# Patient Record
Sex: Female | Born: 1950 | Race: Asian | Hispanic: No | Marital: Married | Smoking: Never smoker
Health system: Southern US, Community
[De-identification: ages and names within clinical notes are randomized; demographics above are authoritative.]

## PROBLEM LIST (undated history)

## (undated) DIAGNOSIS — I10 Essential (primary) hypertension: Secondary | ICD-10-CM

## (undated) DIAGNOSIS — M8580 Other specified disorders of bone density and structure, unspecified site: Secondary | ICD-10-CM

## (undated) DIAGNOSIS — I639 Cerebral infarction, unspecified: Secondary | ICD-10-CM

## (undated) DIAGNOSIS — E7800 Pure hypercholesterolemia, unspecified: Secondary | ICD-10-CM

---

## 2019-10-08 ENCOUNTER — Ambulatory Visit: Payer: Self-pay

## 2019-10-08 ENCOUNTER — Ambulatory Visit: Payer: Self-pay | Attending: Internal Medicine

## 2019-10-08 DIAGNOSIS — Z23 Encounter for immunization: Secondary | ICD-10-CM | POA: Insufficient documentation

## 2019-10-08 NOTE — Progress Notes (Signed)
   Covid-19 Vaccination Clinic  Name:  Brittany Griffith    MRN: [id number] DOB: 03-30-1951  10/08/2019  Ms. Brittany Griffith was observed post Covid-19 immunization for 15 minutes without incidence. She was provided with Vaccine Information Sheet and instruction to access the V-Safe system.   Ms. Brittany Griffith was instructed to call 911 with any severe reactions post vaccine: Marland Kitchen Difficulty breathing  . Swelling of your face and throat  . A fast heartbeat  . A bad rash all over your body  . Dizziness and weakness    Immunizations Administered    Name Date Dose VIS Date Route   Pfizer COVID-19 Vaccine 10/08/2019 12:13 PM 0.3 mL 07/24/2019 Intramuscular   Manufacturer: [place]   Lot: [id number]   [place]: [id number]

## 2019-10-28 ENCOUNTER — Ambulatory Visit: Payer: Self-pay | Attending: Internal Medicine

## 2019-10-28 DIAGNOSIS — Z23 Encounter for immunization: Secondary | ICD-10-CM

## 2019-10-28 NOTE — Progress Notes (Signed)
   Covid-19 Vaccination Clinic  Name:  Mekiya Nemechek    MRN: [id number] DOB: 01-16-1951  10/28/2019  Ms. Hustead was observed post Covid-19 immunization for 15 minutes without incident. She was provided with Vaccine Information Sheet and instruction to access the V-Safe system.   Ms. [name] was instructed to call 911 with any severe reactions post vaccine: Marland Kitchen Difficulty breathing  . Swelling of face and throat  . A fast heartbeat  . A bad rash all over body  . Dizziness and weakness   Immunizations Administered    Name Date Dose VIS Date Route   Pfizer COVID-19 Vaccine 10/28/2019  1:34 PM 0.3 mL 07/24/2019 Intramuscular   Manufacturer: [place]   Lot: [id number]   [place]: [id number]

## 2020-01-06 ENCOUNTER — Other Ambulatory Visit: Payer: Self-pay | Admitting: Family Medicine

## 2020-01-06 DIAGNOSIS — Z1231 Encounter for screening mammogram for malignant neoplasm of breast: Secondary | ICD-10-CM

## 2020-01-06 DIAGNOSIS — E2839 Other primary ovarian failure: Secondary | ICD-10-CM

## 2020-10-23 ENCOUNTER — Emergency Department (HOSPITAL_COMMUNITY): Payer: Medicare HMO

## 2020-10-23 ENCOUNTER — Observation Stay (HOSPITAL_COMMUNITY): Payer: Medicare HMO

## 2020-10-23 ENCOUNTER — Other Ambulatory Visit: Payer: Self-pay

## 2020-10-23 ENCOUNTER — Inpatient Hospital Stay (HOSPITAL_COMMUNITY)
Admission: EM | Admit: 2020-10-23 | Discharge: 2020-11-01 | DRG: 023 | Disposition: A | Discharge status: Rehabilitation Facility | Payer: Medicare HMO | Attending: Neurology | Admitting: Neurology

## 2020-10-23 ENCOUNTER — Encounter (HOSPITAL_COMMUNITY): Payer: Self-pay | Admitting: Emergency Medicine

## 2020-10-23 DIAGNOSIS — I6602 Occlusion and stenosis of left middle cerebral artery: Secondary | ICD-10-CM | POA: Diagnosis present

## 2020-10-23 DIAGNOSIS — R29709 NIHSS score 9: Secondary | ICD-10-CM | POA: Diagnosis present

## 2020-10-23 DIAGNOSIS — I160 Hypertensive urgency: Secondary | ICD-10-CM | POA: Diagnosis not present

## 2020-10-23 DIAGNOSIS — I771 Stricture of artery: Secondary | ICD-10-CM

## 2020-10-23 DIAGNOSIS — R4702 Dysphasia: Secondary | ICD-10-CM | POA: Diagnosis present

## 2020-10-23 DIAGNOSIS — D329 Benign neoplasm of meninges, unspecified: Secondary | ICD-10-CM | POA: Diagnosis present

## 2020-10-23 DIAGNOSIS — Z823 Family history of stroke: Secondary | ICD-10-CM

## 2020-10-23 DIAGNOSIS — R29810 Facial weakness: Secondary | ICD-10-CM | POA: Diagnosis present

## 2020-10-23 DIAGNOSIS — D320 Benign neoplasm of cerebral meninges: Secondary | ICD-10-CM | POA: Diagnosis present

## 2020-10-23 DIAGNOSIS — E876 Hypokalemia: Secondary | ICD-10-CM | POA: Diagnosis present

## 2020-10-23 DIAGNOSIS — R4701 Aphasia: Secondary | ICD-10-CM | POA: Diagnosis not present

## 2020-10-23 DIAGNOSIS — R531 Weakness: Secondary | ICD-10-CM

## 2020-10-23 DIAGNOSIS — M852 Hyperostosis of skull: Secondary | ICD-10-CM | POA: Diagnosis present

## 2020-10-23 DIAGNOSIS — E041 Nontoxic single thyroid nodule: Secondary | ICD-10-CM | POA: Diagnosis present

## 2020-10-23 DIAGNOSIS — Z20822 Contact with and (suspected) exposure to covid-19: Secondary | ICD-10-CM | POA: Diagnosis present

## 2020-10-23 DIAGNOSIS — I63512 Cerebral infarction due to unspecified occlusion or stenosis of left middle cerebral artery: Secondary | ICD-10-CM

## 2020-10-23 DIAGNOSIS — G936 Cerebral edema: Secondary | ICD-10-CM | POA: Diagnosis present

## 2020-10-23 DIAGNOSIS — I639 Cerebral infarction, unspecified: Secondary | ICD-10-CM

## 2020-10-23 DIAGNOSIS — R1312 Dysphagia, oropharyngeal phase: Secondary | ICD-10-CM | POA: Diagnosis present

## 2020-10-23 DIAGNOSIS — G8191 Hemiplegia, unspecified affecting right dominant side: Secondary | ICD-10-CM | POA: Diagnosis present

## 2020-10-23 DIAGNOSIS — D72829 Elevated white blood cell count, unspecified: Secondary | ICD-10-CM | POA: Diagnosis not present

## 2020-10-23 DIAGNOSIS — R739 Hyperglycemia, unspecified: Secondary | ICD-10-CM | POA: Diagnosis not present

## 2020-10-23 DIAGNOSIS — I672 Cerebral atherosclerosis: Secondary | ICD-10-CM | POA: Diagnosis present

## 2020-10-23 DIAGNOSIS — E785 Hyperlipidemia, unspecified: Secondary | ICD-10-CM | POA: Diagnosis present

## 2020-10-23 DIAGNOSIS — G40209 Localization-related (focal) (partial) symptomatic epilepsy and epileptic syndromes with complex partial seizures, not intractable, without status epilepticus: Secondary | ICD-10-CM | POA: Diagnosis present

## 2020-10-23 HISTORY — DX: Pure hypercholesterolemia, unspecified: E78.00

## 2020-10-23 HISTORY — DX: Other specified disorders of bone density and structure, unspecified site: M85.80

## 2020-10-23 HISTORY — DX: Essential (primary) hypertension: I10

## 2020-10-23 LAB — RAPID URINE DRUG SCREEN, HOSP PERFORMED
Amphetamines: NOT DETECTED
Barbiturates: NOT DETECTED
Benzodiazepines: NOT DETECTED
Cocaine: NOT DETECTED
Opiates: NOT DETECTED
Tetrahydrocannabinol: NOT DETECTED

## 2020-10-23 LAB — COMPREHENSIVE METABOLIC PANEL
ALT: 17 U/L (ref 0–44)
AST: 22 U/L (ref 15–41)
Albumin: 3.8 g/dL (ref 3.5–5.0)
Alkaline Phosphatase: 54 U/L (ref 38–126)
Anion gap: 10 (ref 5–15)
BUN: 11 mg/dL (ref 8–23)
CO2: 21 mmol/L — ABNORMAL LOW (ref 22–32)
Calcium: 9.2 mg/dL (ref 8.9–10.3)
Chloride: 108 mmol/L (ref 98–111)
Creatinine, Ser: 0.67 mg/dL (ref 0.44–1.00)
GFR, Estimated: >60 mL/min (ref >60)
Glucose, Bld: 94 mg/dL (ref 70–99)
Potassium: 3.6 mmol/L (ref 3.5–5.1)
Sodium: 139 mmol/L (ref 135–145)
Total Bilirubin: 1 mg/dL (ref 0.3–1.2)
Total Protein: 6.9 g/dL (ref 6.5–8.1)

## 2020-10-23 LAB — I-STAT CHEM 8, ED
BUN: 12 mg/dL (ref 8–23)
Calcium, Ion: 1.03 mmol/L — ABNORMAL LOW (ref 1.15–1.40)
Chloride: 109 mmol/L (ref 98–111)
Creatinine, Ser: 0.7 mg/dL (ref 0.44–1.00)
Glucose, Bld: 91 mg/dL (ref 70–99)
HCT: 38 % (ref 36.0–46.0)
Hemoglobin: 12.9 g/dL (ref 12.0–15.0)
Potassium: 3.5 mmol/L (ref 3.5–5.1)
Sodium: 141 mmol/L (ref 135–145)
TCO2: 22 mmol/L (ref 22–32)

## 2020-10-23 LAB — URINALYSIS, ROUTINE W REFLEX MICROSCOPIC
Bilirubin Urine: NEGATIVE
Glucose, UA: NEGATIVE mg/dL
Hgb urine dipstick: NEGATIVE
Ketones, ur: NEGATIVE mg/dL
Leukocytes,Ua: NEGATIVE
Nitrite: NEGATIVE
Protein, ur: NEGATIVE mg/dL
Specific Gravity, Urine: 1.027 (ref 1.005–1.030)
pH: 6 (ref 5.0–8.0)

## 2020-10-23 LAB — CBC
HCT: 42.5 % (ref 36.0–46.0)
Hemoglobin: 13.4 g/dL (ref 12.0–15.0)
MCH: 28.6 pg (ref 26.0–34.0)
MCHC: 31.5 g/dL (ref 30.0–36.0)
MCV: 90.8 fL (ref 80.0–100.0)
Platelets: 213 10*3/uL (ref 150–400)
RBC: 4.68 MIL/uL (ref 3.87–5.11)
RDW: 12.3 % (ref 11.5–15.5)
WBC: 6.8 10*3/uL (ref 4.0–10.5)
nRBC: 0 % (ref 0.0–0.2)

## 2020-10-23 LAB — CBG MONITORING, ED: Glucose-Capillary: 98 mg/dL (ref 70–99)

## 2020-10-23 LAB — RESP PANEL BY RT-PCR (FLU A&B, COVID) ARPGX2
Influenza A by PCR: NEGATIVE
Influenza B by PCR: NEGATIVE
SARS Coronavirus 2 by RT PCR: NEGATIVE

## 2020-10-23 MED ORDER — GADOBUTROL 1 MMOL/ML IV SOLN
7.0000 mL | Freq: Once | INTRAVENOUS | Status: AC | PRN
  Administered 2020-10-23: 7 mL via INTRAVENOUS

## 2020-10-23 MED ORDER — ACETAMINOPHEN 325 MG PO TABS: 650.0000 mg | ORAL_TABLET | ORAL | Status: DC | PRN

## 2020-10-23 MED ORDER — IOHEXOL 350 MG/ML SOLN
75.0000 mL | Freq: Once | INTRAVENOUS | Status: AC | PRN
  Administered 2020-10-23: 75 mL via INTRAVENOUS

## 2020-10-23 MED ORDER — STROKE: EARLY STAGES OF RECOVERY BOOK
Freq: Once | Status: AC
  Filled 2020-10-23: qty 1

## 2020-10-23 MED ORDER — ACETAMINOPHEN 650 MG RE SUPP: 650.0000 mg | RECTAL | Status: DC | PRN

## 2020-10-23 MED ORDER — DEXAMETHASONE 4 MG PO TABS
2.0000 mg | ORAL_TABLET | Freq: Once | ORAL | Status: AC
  Administered 2020-10-23: 2 mg via ORAL
  Filled 2020-10-23: qty 1

## 2020-10-23 MED ORDER — ACETAMINOPHEN 160 MG/5ML PO SOLN
650.0000 mg | ORAL | Status: DC | PRN
  Administered 2020-10-28 (×2): 650 mg
  Filled 2020-10-23 (×2): qty 20.3

## 2020-10-23 MED ORDER — SODIUM CHLORIDE 0.9 % IV SOLN: INTRAVENOUS | Status: DC

## 2020-10-23 MED ORDER — LEVETIRACETAM IN NACL 1000 MG/100ML IV SOLN
1000.0000 mg | Freq: Once | INTRAVENOUS | Status: AC
  Administered 2020-10-23: 1000 mg via INTRAVENOUS
  Filled 2020-10-23: qty 100

## 2020-10-23 NOTE — ED Triage Notes (Signed)
Pt BIB [facility] c/o AMS. Husband reports LSN at 0930 this AM. Husband found pt slumped over on a chair. Per husband, pt was nonverbal, tracking with eyes, crying, unable to follow commands. Pt presented the same on EMS arrival, sx resolved ~20 min after EMS arrival. Pt now A/Ox4, able to follow commands. No neuro deficits on assessment. Pt reports mild headache last night and being unable to sleep well for a few days.   EMS VS 190/98, HR 60, SpO2 98%, CBG 113

## 2020-10-23 NOTE — ED Provider Notes (Signed)
  Physical Exam  BP (!) 204/78   Pulse 96   Temp (!) 97.5 F (36.4 C) (Oral)   Resp (!) 24   Ht 5\' 2"  (1.575 m)   Wt 69.9 kg   SpO2 98%   BMI 28.17 kg/m   Physical Exam  ED Course/Procedures     .Critical Care Performed by: Varney Biles, MD Authorized by: Varney Biles, MD   Critical care provider statement:    Critical care time (minutes):  44   Critical care was necessary to treat or prevent imminent or life-threatening deterioration of the following conditions:  CNS failure or compromise   Critical care was time spent personally by me on the following activities:  Discussions with consultants, evaluation of patient's response to treatment, examination of patient, ordering and performing treatments and interventions, ordering and review of laboratory studies, ordering and review of radiographic studies, pulse oximetry, re-evaluation of patient's condition, obtaining history from patient or surrogate and review of old charts    MDM   70 year old healthy woman comes in with chief complaint of weakness and confusion. She has no significant medical history and no prior history of stroke.  Patient is not on any antihypertensive, but was told that she has borderline elevated BP.  It appears that she woke up this morning and felt off.  When she was brushing her teeth she noted that she had applied paste to her mouth.  Thereafter when she was holding her blankets, she was unable to do so.  Family when interviewing noted that she was " out of it".  They also noted right-sided upper and lower extremity weakness.  Patient had facial droop.  Family felt like she was unable to express herself freely.  When I saw her patient was back to baseline normal.  She did not have any facial droop or weakness.  She felt that she was able to express herself without any issues and that her speech was back to baseline normal.  Initial suspicion was for TIA versus small stroke.  We proceeded with  CT angiogram and MR of the brain without contrast. Subsequently it was noted that she had large meningioma.  MRI with and without contrast was ordered and it confirmed a large meningioma with acute versus subacute left-sided stroke.  Case discussed with neurosurgery. Dr. Venetia Constable will see the patient.  He recommends admission to the hospital with the finding of stroke. Dr. Venetia Constable has also called neurology given that he felt that his speech was still not fluent.  Medicine will admit the patient.  Patient's BP is high, allowing permissive hypertension. Keppra and dexamethasone given here.  Patient reassessed multiple times.     Varney Biles, MD 10/23/20 1745

## 2020-10-23 NOTE — H&P (Signed)
[facility] History and Physical   Patient: Brittany Griffith YJE:[id number]   PCP: [facility] [place] DOB: 01/02/1951   DOA: 10/23/2020   DOS: 10/23/2020   DOS: the patient was seen and examined on 10/23/2020  Patient coming from: The patient is coming from Home  Chief Complaint: Difficulty with speech and right-sided weakness  HPI: Brittany Griffith is a 70 y.o. female with Past medical history of hyperlipidemia and hypertension and osteopenia. Patient presented with complaints of speech difficulty and right-sided weakness. History was primarily obtained by husband.  Patient started having difficulty with right-sided weakness around 9:30 AM.  Husband found the patient slumped over in a chair.  Patient was initially nonverbal but tracking with the eyes.  Had significant weakness of her right side upper extremity and lower extremity.  Also had facial drooping on the right side.  On EMS arrival symptom completely resolved. Apparently this morning prior to this event patient was not able to further plan care, use a toothpaste. While initially had the symptoms resolved in the ER later on the day at the time of my evaluation patient started having difficulty with getting the words out.  She was able to tell me her name her place date of birth.  No nausea no vomiting.  No chest pain.  On diarrhea no constipation.  No fever no chills.  Does not take any medication at her baseline.  No smoking history.  No prior fall dizziness passing out history.  ED Course: Presented with right-sided weakness.  Initial concern for TIA/stroke.  CT head showed a meningioma.  Neurosurgery was consulted.  Patient was given a gram of Keppra and was recommended for observation given the severity of the mass.  Due to intermittent aphasic complaint neurology was consulted for further evaluation as MRI was also positive for acute stroke.  Patient was out of the window for TPA.   Review of Systems: as  mentioned in the history of present illness.  All other systems reviewed and are negative.  Past Medical History:  Diagnosis Date  . Hypercholesteremia   . Hypertension   . Osteopenia    No significant surgical history. Social History:  no history for tobacco use, alcohol use, and drug use.  Allergies  Allergen Reactions  . Penicillins    Family history reviewed and not pertinent Family History  Problem Relation Age of Onset  . Stroke Father   . Heart attack Father   . Kidney disease Brother      Prior to Admission medications   Not on File    Physical Exam: Vitals:   10/23/20 1815 10/23/20 1830 10/23/20 1845 10/23/20 1900  BP: (!) 181/67 (!) 195/73 (!) 179/67 (!) 165/71  Pulse: 69 66 69 67  Resp: 18 16 (!) 21 16  Temp:      TempSrc:      SpO2: 96% 96% 96% 98%  Weight:      Height:        General: alert and oriented to time, place, and person. Appear in moderate distress, affect tearful Eyes: PERRL, Conjunctiva normal ENT: Oral Mucosa Clear, moist  Neck: no JVD, no Abnormal Mass Or lumps Cardiovascular: S1 and S2 Present, no Murmur, peripheral pulses symmetrical Respiratory: good respiratory effort, Bilateral Air entry equal and Decreased, no signs of accessory muscle use, Clear to Auscultation, no Crackles, no wheezes Abdomen: Bowel Sound present, Soft and no tenderness, no hernia Skin: no rashes  Extremities: no Pedal edema, no calf tenderness Neurologic:  mental status, alert and oriented x3, speech normal, PERLA, Motor strength 5/5 and symmetric, Sensation grossly normal to light touch and Reflex difficult to assess Gait not checked due to patient safety concerns  Data Reviewed: I have personally reviewed and interpreted labs, imaging as discussed below.  CBC: Recent Labs  Lab 10/23/20 1138 10/23/20 1206  WBC 6.8  --   HGB 13.4 12.9  HCT 42.5 38.0  MCV 90.8  --   PLT 213  --    Basic Metabolic Panel: Recent Labs  Lab 10/23/20 1138  10/23/20 1206  NA 139 141  K 3.6 3.5  CL 108 109  CO2 21*  --   GLUCOSE 94 91  BUN 11 12  CREATININE 0.67 0.70  CALCIUM 9.2  --    GFR: Estimated Creatinine Clearance: 62.5 mL/min (by C-G formula based on SCr of 0.7 mg/dL). Liver Function Tests: Recent Labs  Lab 10/23/20 1138  AST 22  ALT 17  ALKPHOS 54  BILITOT 1.0  PROT 6.9  ALBUMIN 3.8   No results for input(s): LIPASE, AMYLASE in the last 168 hours. No results for input(s): AMMONIA in the last 168 hours. Coagulation Profile: No results for input(s): INR, PROTIME in the last 168 hours. Cardiac Enzymes: No results for input(s): CKTOTAL, CKMB, CKMBINDEX, TROPONINI in the last 168 hours. BNP (last 3 results) No results for input(s): PROBNP in the last 8760 hours. HbA1C: No results for input(s): HGBA1C in the last 72 hours. CBG: Recent Labs  Lab 10/23/20 1126  GLUCAP 98   Lipid Profile: No results for input(s): CHOL, HDL, LDLCALC, TRIG, CHOLHDL, LDLDIRECT in the last 72 hours. Thyroid Function Tests: No results for input(s): TSH, T4TOTAL, FREET4, T3FREE, THYROIDAB in the last 72 hours. Anemia Panel: No results for input(s): VITAMINB12, FOLATE, FERRITIN, TIBC, IRON, RETICCTPCT in the last 72 hours. Urine analysis:    Component Value Date/Time   COLORURINE STRAW (A) 10/23/2020 1430   APPEARANCEUR CLEAR 10/23/2020 1430   LABSPEC 1.027 10/23/2020 1430   PHURINE 6.0 10/23/2020 1430   GLUCOSEU NEGATIVE 10/23/2020 1430   HGBUR NEGATIVE 10/23/2020 1430   BILIRUBINUR NEGATIVE 10/23/2020 1430   KETONESUR NEGATIVE 10/23/2020 1430   PROTEINUR NEGATIVE 10/23/2020 1430   NITRITE NEGATIVE 10/23/2020 1430   LEUKOCYTESUR NEGATIVE 10/23/2020 1430    Radiological Exams on Admission: CT Angio Head W/Cm &/Or Wo Cm  Result Date: 10/23/2020 CLINICAL DATA:  Altered mental status. EXAM: CT ANGIOGRAPHY HEAD AND NECK TECHNIQUE: Multidetector CT imaging of the head and neck was performed using the standard protocol during bolus  administration of intravenous contrast. Multiplanar CT image reconstructions and MIPs were obtained to evaluate the vascular anatomy. Carotid stenosis measurements (when applicable) are obtained utilizing NASCET criteria, using the distal internal carotid diameter as the denominator. CONTRAST:  11mL OMNIPAQUE IOHEXOL 350 MG/ML SOLN COMPARISON:  None. FINDINGS: CT HEAD FINDINGS Brain: There is a 2.2 x 2.2 x 2.4 cm partially calcified extra-axial mass anteriorly along the falx in the left frontal region with evidence of very mild edema in the left frontal lobe. This mass is associated with extensive dural ossification/hyperostosis anteriorly and laterally over the left frontal convexity with mass effect on the underlying left frontal lobe. Separate foci of dural calcification/ossification are noted more superiorly along the falx. There is also an 8 mm focus of fat along the inner aspect of the anterior left frontal hyperostosis. There is trace rightward midline shift at the level of the mass. There is mild mass effect on the left  lateral ventricle. There is no evidence of hydrocephalus, an acute infarct, intracranial hemorrhage, or extra-axial fluid collection. A small right cerebellar infarct is favored to be chronic. Vascular: Calcified atherosclerosis at the skull base. Skull: Extensive left frontal hyperostosis. Sinuses: Partially visualized mucosal thickening and likely fluid in the maxillary sinuses. Clear mastoid air cells. Orbits: Unremarkable. 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 Wide patency of both subclavian arteries with mild nonstenotic plaque in the proximal left subclavian artery. Right carotid system: Patent with mild atheromatous wall thickening in the mid and distal common carotid artery. No evidence of a dissection or  significant stenosis. Left carotid system: Patent with mild atheromatous wall thickening in the mid and distal common carotid artery and mild calcified plaque at the carotid bifurcation. No evidence of dissection or significant stenosis. Vertebral arteries: Patent without evidence of stenosis or dissection. Strongly dominant left vertebral artery. Skeleton: Mild disc and moderate facet degeneration in the cervical spine. Other neck: Bilateral thyroid nodules including a 2.3 cm nodule on the right. No evidence of cervical lymphadenopathy Upper chest: Left greater than right apical lung scarring. Review of the MIP images confirms the above findings CTA HEAD FINDINGS Anterior circulation: The internal carotid arteries are patent from skull base to carotid termini with mild atherosclerotic plaque bilaterally not resulting in a significant stenosis. ACAs and MCAs are patent without evidence of a proximal branch occlusion. There are moderate proximal right M1 and severe distal left M1 stenoses. Mild branch vessel irregularity is present bilaterally. No aneurysm is identified. Posterior circulation: The intracranial vertebral arteries are widely patent to the basilar with the right being diminutive distal to the PICA origin. Patent PICA, AICA, and SCA origins are identified bilaterally. The basilar artery is widely patent. Both PCAs are patent with moderate left greater than right P2 segment stenoses. No aneurysm is identified. Venous sinuses: Narrowed but patent superior sagittal sinus by dural ossification. Anatomic variants: Hypoplastic right vertebral artery. Review of the MIP images confirms the above findings IMPRESSION: 1. 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 Mild edema in the left frontal lobe. Trace local rightward midline shift. Brain MRI without and with contrast is recommended for further evaluation. 2. No evidence of an acute infarct or  intracranial hemorrhage. 3. No large vessel occlusion. 4. Intracranial atherosclerosis including moderate right and severe left M1 stenoses and moderate bilateral P2 stenoses. 5. Widely patent cervical carotid and vertebral arteries. 6. 2.3 cm right thyroid nodule. Recommend thyroid US (ref: J Am Coll Radiol. 2015 Feb;12(2): 143-50). Electronically Signed   By: Logan Bores M.D.   On: 10/23/2020 14:06   CT Angio Neck W and/or Wo Contrast  Result Date: 10/23/2020 CLINICAL DATA:  Altered mental status. EXAM: CT ANGIOGRAPHY HEAD AND NECK TECHNIQUE: Multidetector CT imaging of the head and neck was performed using the standard protocol during bolus administration of intravenous contrast. Multiplanar CT image reconstructions and MIPs were obtained to evaluate the vascular anatomy. Carotid stenosis measurements (when applicable) are obtained utilizing NASCET criteria, using the distal internal carotid diameter as the denominator. CONTRAST:  43mL OMNIPAQUE IOHEXOL 350 MG/ML SOLN COMPARISON:  None. FINDINGS: CT HEAD FINDINGS Brain: There is a 2.2 x 2.2 x 2.4 cm partially calcified extra-axial mass anteriorly along the falx in the left frontal region with evidence of very mild edema  in the left frontal lobe. This mass is associated with extensive dural ossification/hyperostosis anteriorly and laterally over the left frontal convexity with mass effect on the underlying left frontal lobe. Separate foci of dural calcification/ossification are noted more superiorly along the falx. There is also an 8 mm focus of fat along the inner aspect of the anterior left frontal hyperostosis. There is trace rightward midline shift at the level of the mass. There is mild mass effect on the left lateral ventricle. There is no evidence of hydrocephalus, an acute infarct, intracranial hemorrhage, or extra-axial fluid collection. A small right cerebellar infarct is favored to be chronic. Vascular: Calcified atherosclerosis at the skull base.  Skull: Extensive left frontal hyperostosis. Sinuses: Partially visualized mucosal thickening and likely fluid in the maxillary sinuses. Clear mastoid air cells. Orbits: Unremarkable. 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 Wide patency of both subclavian arteries with mild nonstenotic plaque in the proximal left subclavian artery. Right carotid system: Patent with mild atheromatous wall thickening in the mid and distal common carotid artery. No evidence of a dissection or significant stenosis. Left carotid system: Patent with mild atheromatous wall thickening in the mid and distal common carotid artery and mild calcified plaque at the carotid bifurcation. No evidence of dissection or significant stenosis. Vertebral arteries: Patent without evidence of stenosis or dissection. Strongly dominant left vertebral artery. Skeleton: Mild disc and moderate facet degeneration in the cervical spine. Other neck: Bilateral thyroid nodules including a 2.3 cm nodule on the right. No evidence of cervical lymphadenopathy Upper chest: Left greater than right apical lung scarring. Review of the MIP images confirms the above findings CTA HEAD FINDINGS Anterior circulation: The internal carotid arteries are patent from skull base to carotid termini with mild atherosclerotic plaque bilaterally not resulting in a significant stenosis. ACAs and MCAs are patent without evidence of a proximal branch occlusion. There are moderate proximal right M1 and severe distal left M1 stenoses. Mild branch vessel irregularity is present bilaterally. No aneurysm is identified. Posterior circulation: The intracranial vertebral arteries are widely patent to the basilar with the right being diminutive distal to the PICA origin. Patent PICA, AICA, and SCA origins are identified bilaterally.  The basilar artery is widely patent. Both PCAs are patent with moderate left greater than right P2 segment stenoses. No aneurysm is identified. Venous sinuses: Narrowed but patent superior sagittal sinus by dural ossification. Anatomic variants: Hypoplastic right vertebral artery. Review of the MIP images confirms the above findings IMPRESSION: 1. 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 Mild edema in the left frontal lobe. Trace local rightward midline shift. Brain MRI without and with contrast is recommended for further evaluation. 2. No evidence of an acute infarct or intracranial hemorrhage. 3. No large vessel occlusion. 4. Intracranial atherosclerosis including moderate right and severe left M1 stenoses and moderate bilateral P2 stenoses. 5. Widely patent cervical carotid and vertebral arteries. 6. 2.3 cm right thyroid nodule. Recommend thyroid US (ref: J Am Coll Radiol. 2015 Feb;12(2): 143-50). Electronically Signed   By: Logan Bores M.D.   On: 10/23/2020 14:06   MR Brain W and Wo Contrast  Result Date: 10/23/2020 CLINICAL DATA:  TIA. Altered mental status. Left frontal parafalcine mass on CT. EXAM: MRI HEAD WITHOUT AND WITH CONTRAST TECHNIQUE: Multiplanar, multiecho pulse sequences of the brain and  surrounding structures were obtained without and with intravenous contrast. CONTRAST:  56mL GADAVIST GADOBUTROL 1 MMOL/ML IV SOLN COMPARISON:  Head and neck CTA 10/23/2020 FINDINGS: Brain: There are subcentimeter acute infarcts in the posterior left insula and left parietal subcortical white matter (MCA territory). There is are small chronic bilateral cerebellar infarcts. There is no intracranial hemorrhage, hydrocephalus, or extra-axial fluid collection. Scattered small foci of T2 hyperintensity in the cerebral white matter bilaterally are nonspecific but compatible with mild chronic small vessel ischemic disease. An avidly enhancing  extra-axial mass along the left anterior falx in the frontal region measures 2.6 x 2.2 x 2.5 cm (AP x transverse x craniocaudal) with mild edema in the adjacent left frontal white matter and minimal localized rightward midline shift. 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 This extends inferiorly and laterally into the anterior and middle cranial fossae, and there is a 1.1 cm enhancing component which projects into the left sylvian fissure. A separate enhancing calcified mass along the left aspect of the falx in the posterior frontal region measures 1.5 cm. There is mild diffuse dural thickening along the upper portion of the falx. Vascular: Major intracranial vascular flow voids are preserved. Skull and upper cervical spine: Extensive left frontal hyperostosis. Sinuses/Orbits: Unremarkable orbits. Paranasal sinuses and mastoid air cells are clear. Other: None. IMPRESSION: 1. 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 2. 2.6 cm enhancing left frontal parafalcine mass consistent with a meningioma with mild edema and minimal rightward midline shift. This is associated with extensive dural ossification and left frontal skull hyperostosis along the anterior and lateral cerebral convexities. 3. Smaller meningiomas along the falx in the posterior left frontal region and laterally in the left sylvian fissure. 4. 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 Electronically Signed   By: Logan Bores M.D.   On: 10/23/2020 16:08   EEG adult  Result Date: 10/23/2020 [name], MD     10/23/2020  7:38 PM Patient Name: Geniece Akers MRN: [id number] Epilepsy Attending: [name] Referring Physician/Provider: DR Lesleigh Noe Date: 10/23/2020 Duration: 30.35 mins Patient history: 70 y.o. woman w/ 1h of aphasia and R sided weakness.  EEG to evaluate for seizure Level of alertness: Awake, asleep AEDs during EEG study: Technical aspects: This EEG study was done with scalp electrodes positioned according to the 10-20 International system of electrode placement. Electrical activity was acquired at a sampling rate of 500Hz  and reviewed with a high frequency filter of 70Hz  and a low frequency filter of 1Hz . EEG data were recorded continuously and digitally stored. Description: The posterior dominant rhythm consists of 9-10 Hz activity of moderate voltage (25-35 uV) seen predominantly in posterior head regions, symmetric and reactive to eye opening and eye closing. Sleep was characterized by vertex waves, sleep spindles (12 to 14 Hz), maximal frontocentral region. EEG showed intermitten tleft temporal 2-3hz  delta slowing.  Hyperventilation and photic stimulation were not performed.   ABNORMALITY -Intermittent slow, left temporal IMPRESSION: This study is suggestive of cortical dysfunction in left temporal region which is non specific but could be secondary to underlying structural abnormality. No seizures or epileptiform discharges were seen throughout the recording. Priyanka Barbra Sarks   EKG: Independently reviewed. normal EKG, normal sinus rhythm, unchanged from previous tracings. Echocardiogram: Ordered.  I reviewed all nursing notes, pharmacy notes, vitals, pertinent old records.  Assessment/Plan 1. CVA (  cerebral vascular accident) [facility]) Presents with acute onset of difficulty speaking, confusion as well as right-sided weakness. Found to have left-sided acute stroke. Neurology consult. Will monitor recommendation. Neurosurgery cleared the patient for antiplatelet medication. Further work-up with speech therapy, PT OT as well as echocardiogram. Check hemoglobin A1c and LDL. Monitor on telemetry  2.  Meningioma Neurosurgery consulted. Significant left hemispheric convexity with hyperostosis. Mild cerebral edema and brain  compression. No frank herniation. No intervention recommended by neurosurgery. Keppra was given will await for further recommendation from neurology as well as neurosurgery.  With regards to antiseizure medication as well as steroids.  3.  Hypertensive urgency. We will allow permissive hypertension right now in the setting of acute stroke.  Nutrition: N.p.o. pending swallowing evaluation DVT Prophylaxis: SCD, pharmacological prophylaxis contraindicated due to Concern for bleeding  Advance goals of care discussion: Full code   Consults: Neurosurgery and neurology.  Family Communication: family was present at bedside, at the time of interview.  Opportunity was given to ask question and all questions were answered satisfactorily.   Disposition:  From: Home Likely will need Home on discharge.   Author: Berle Mull, MD [facility] 10/23/2020 7:58 PM   To reach On-call, see care teams to locate the attending and reach out to them via [email or web address]. If 7PM-7AM, please contact night-coverage If you still have difficulty reaching the attending provider, please page the [facility] (Director on Call) for [facility] on amion for assistance.

## 2020-10-23 NOTE — ED Provider Notes (Signed)
[facility] Provider Note   CSN: [id number] Arrival date & time: 10/23/20  1113     History Chief Complaint  Patient presents with  . Altered Mental Status    [facility] is a 70 y.o. female with a past medical history significant for hypertension not currently on any medications who presents to the ED via EMS due to altered mental status.  Per patient she was folding a blanket around 9:30 AM and was unable to perform the task. She notes she then sat on the ground and has no memory after. She admits to mild headache last night that resolved. No preceding symptoms this morning before episode. Per EMS, husband found patient slumped over in a chair and was nonverbal, tracking with eyes, crying, and unable to follow commands. Upon EMS arrival, patient exhibiting same symptoms. Symptoms resolved 20 minutes after EMS arrival. [facility]. Patient notes she was in her normal state of health prior to this episode. No symptoms when she woke up. Patient denies any complaints right now.  Denies visual changes, unilateral weakness, facial droop, dizziness, and speech changes.  No previous history of CVA or TIA.  Patient states she takes over-the-counter vitamins and fish oil however, no other medications.  No treatment prior to arrival.  Spoke to husband, [name], on the phone who notes patient was at her normal state of health upon waking this morning around 530/6 AM.  Husband notes he fell asleep this morning and woke up around 9:30 hearing abnormal banging. He found his wife sitting on the floor and was unable to communicate or get up. Husband then lifted wife off the floor and noticed right-sided weakness and a right-sided facial droop. Symptoms all resolved within 1 hour. No previous history of TIA/CVA.    History obtained from patient and past medical records. No interpreter used during encounter.      Past Medical History:  Diagnosis Date  . Hypertension      There are no problems to display for this patient.  OB History   No obstetric history on file.     No family history on file.     Home Medications Prior to Admission medications   Not on File    Allergies    Penicillins  Review of Systems   Review of Systems  Constitutional: Negative for chills and fever.  Eyes: Negative for visual disturbance.  Respiratory: Negative for shortness of breath.   Cardiovascular: Negative for chest pain.  Gastrointestinal: Negative for abdominal pain.  Neurological: Positive for headaches (resolved). Negative for dizziness, facial asymmetry, weakness and numbness.  All other systems reviewed and are negative.   Physical Exam Updated Vital Signs BP (!) 200/77 (BP Location: Left Arm)   Pulse 70   Temp (!) 97.5 F (36.4 C) (Oral)   Resp 16   Ht 5\' 2"  (1.575 m)   Wt 69.9 kg   SpO2 100%   BMI 28.17 kg/m   Physical Exam Vitals and nursing note reviewed.  Constitutional:      General: She is not in acute distress. HENT:     Head: Normocephalic.  Eyes:     Extraocular Movements: Extraocular movements intact.     Pupils: Pupils are equal, round, and reactive to light.  Cardiovascular:     Rate and Rhythm: Normal rate and regular rhythm.     Pulses: Normal pulses.     Heart sounds: Normal heart sounds. No murmur heard. No friction rub. No gallop.  Pulmonary:     Effort: Pulmonary effort is normal.     Breath sounds: Normal breath sounds.  Abdominal:     General: Abdomen is flat. Bowel sounds are normal. There is no distension.     Palpations: Abdomen is soft.     Tenderness: There is no abdominal tenderness. There is no guarding or rebound.  Musculoskeletal:     Cervical back: Neck supple.     Comments: Able to move all 4 extremities without difficulty.  Skin:    General: Skin is warm and dry.  Neurological:     General: No focal deficit present.     Comments: Speech is clear, able to follow commands CN III-XII  intact Normal strength in upper and lower extremities bilaterally including dorsiflexion and plantar flexion, strong and equal grip strength Sensation grossly intact throughout Moves extremities without ataxia, coordination intact No pronator drift  Psychiatric:        Mood and Affect: Mood normal.        Behavior: Behavior normal.     ED Results / Procedures / Treatments   Labs (all labs ordered are listed, but only abnormal results are displayed) Labs Reviewed  RAPID URINE DRUG SCREEN, HOSP PERFORMED  COMPREHENSIVE METABOLIC PANEL  CBC  URINALYSIS, ROUTINE W REFLEX MICROSCOPIC  CBG MONITORING, ED    EKG EKG Interpretation  Date/Time:  Sunday October 23 2020 11:18:55 EDT Ventricular Rate:  67 PR Interval:    QRS Duration: 88 QT Interval:  437 QTC Calculation: 462 R Axis:   4 Text Interpretation: Sinus rhythm Nonspecific T abnormalities, lateral leads Minimal ST elevation, anterior leads No old tracing to compare Confirmed by Knapp, Jon ([id number]) on 10/23/2020 11:21:33 AM   Radiology No results found.  Procedures Procedures   Medications Ordered in ED Medications - No data to display  ED Course  I have reviewed the triage vital signs and the nursing notes.  Pertinent labs & imaging results that were available during my care of the patient were reviewed by me and considered in my medical decision making (see chart for details).    MDM Rules/Calculators/A&P                         69  year old female with a history of hypertension not currently on any medications presents to the ED due to sudden onset of AMS that lasted roughly 1 hour. Patient found by husband slumped over, eyes tracking, and nonverbal. No preceding symptoms except for mild headache last night that resolved. No history of CVA or TIA. Episode occurred around 9:30AM.  Upon arrival, patient hypertensive at 200/77.  Patient in no acute distress and nontoxic-appearing.  Patient AAOx4.  Normal neurological exam.  Will work up for possible TIA. CT head, CTA head/neck, and MRI ordered. Routine labs. Discussed case with Dr. Kathrynn Humble who evaluated patient at bedside and agrees with assessment and plan.   CBC unremarkable no leukocytosis and normal hemoglobin. EKG personally reviewed which demonstrates sinus tachycardia with no signs of acute ischemia.  CMP reassuring with normal renal function no major electrolyte derangements.  UA unremarkable with no signs of hematuria or infection. UDS negative. COVID/influenza negative. CTA head/neck personally reviewed which demonstrates: IMPRESSION:  1. 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 Mild edema  in the left frontal lobe. Trace local rightward midline shift. Brain  MRI without and with contrast is recommended for further evaluation.  2. No  evidence of an acute infarct or intracranial hemorrhage.  3. No large vessel occlusion.  4. Intracranial atherosclerosis including moderate right and severe  left M1 stenoses and moderate bilateral P2 stenoses.  5. Widely patent cervical carotid and vertebral arteries.  6. 2.3 cm right thyroid nodule. Recommend thyroid US (ref: J Am Coll  Radiol. 2015 Feb;12(2): 143-50).   Patient handed off to Dr. Kathrynn Humble pending MRI results and neurosurgery consult. Patient will require admission for further evaluation.  Final Clinical Impression(s) / ED Diagnoses Final diagnoses:  None    Rx / DC Orders ED Discharge Orders    None       Karie Kirks 10/23/20 1601    Varney Biles, MD 10/23/20 [id number]    Varney Biles, MD 10/23/20 1746

## 2020-10-23 NOTE — Procedures (Signed)
Patient Name: [facility]  MRN: [id number]  Epilepsy Attending: [name]  Referring Physician/Provider: DR Lesleigh Noe Date: 10/23/2020 Duration: 30.35 mins  Patient history: 70 y.o. woman w/ 1h of aphasia and R sided weakness. EEG to evaluate for seizure  Level of alertness: Awake, asleep  AEDs during EEG study:   Technical aspects: This EEG study was done with scalp electrodes positioned according to the 10-20 International system of electrode placement. Electrical activity was acquired at a sampling rate of 500Hz  and reviewed with a high frequency filter of 70Hz  and a low frequency filter of 1Hz . EEG data were recorded continuously and digitally stored.   Description: The posterior dominant rhythm consists of 9-10 Hz activity of moderate voltage (25-35 uV) seen predominantly in posterior head regions, symmetric and reactive to eye opening and eye closing. Sleep was characterized by vertex waves, sleep spindles (12 to 14 Hz), maximal frontocentral region. EEG showed intermitten tleft temporal 2-3hz  delta slowing.  Hyperventilation and photic stimulation were not performed.     ABNORMALITY -Intermittent slow, left temporal  IMPRESSION: This study is suggestive of cortical dysfunction in left temporal region which is non specific but could be secondary to underlying structural abnormality. No seizures or epileptiform discharges were seen throughout the recording.  [facility] Griffith

## 2020-10-23 NOTE — ED Notes (Signed)
Patient transported to CT 

## 2020-10-23 NOTE — ED Notes (Signed)
Pt back from CT

## 2020-10-23 NOTE — Consult Note (Addendum)
NEURO HOSPITALIST CONSULT NOTE   Requestig physician: Dr. Posey Pronto  Reason for Consult: Episode of unresponsiveness  History obtained from:   Chart     HPI:                                                                                                                                          Baleria Wyman is an 70 y.o. female with a PMHx of HTN who presented to the ED this morning via EMS after she was found by her husband sitting on the floor slumped over, nonverbal and unable to follow commands, but awake, tracking with her eyes and crying. Husband had been awakened from a nap hearing abnormal banging sounds. Husband then lifted his wife off the floor and noticed right-sided weakness and a right-sided facial droop. LKN was 0930. She continued to exhibit the same symptoms on EMS arrival to the home, with resolution approximately 20 minutes later. On arrival to the ED, she was alert and oriented x 4, able to follow commands and with no neuro deficits seen on Triage RN assessment. She was complaining of a mild headache the previous night and being unable to sleep well for a few days. Her initial BP on arrival was 190/98, HR 60, normal O2 saturation, CBG 113. BP subsequently was 200/77. In the ED the patient stated that she had been folding a blanket at about 9:30 AM when she noticed that she was unable to perform the task. She then sat on the ground and has no memory of the events occurring afterwards. She has no prior history of stroke or TIA. She is only on OTC vitamins and fish oil. She had no visual changes, facial droop, speech changes, lateralized weakness, dizziness during the periods that she can recall prior to and after the episode. There were no prodromal symptoms prior to the above, and other than the headache, she has been feeling well. Total time of her AMS is estimated to be about one hour.   CTA of head and neck was obtained, revealing the following: CT: Left frontal  parafalcine mass with extensive regional hyperostosis which may reflect an en plaque meningioma. Mild edema in the left frontal lobe. Trace local rightward midline shift. No evidence of intracranial hemorrhage. CTA head: Intracranial atherosclerosis including moderate right and severe left M1 stenoses and moderate bilateral P2 stenoses. CTA neck: Widely patent cervical carotid and vertebral arteries.  MRI brain with and without contrast was then obtained, revealing the following: 1. 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 2. 2.6 cm enhancing left frontal parafalcine mass consistent with a meningioma with mild edema and minimal rightward midline shift. This is associated with extensive dural ossification and left frontal skull hyperostosis along the anterior and lateral  cerebral convexities. 3. Smaller meningiomas along the falx in the posterior left frontal region and laterally in the left sylvian fissure. 4. 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   EKG: Sinus rhythm. Nonspecific T abnormalities in lateral leads. Minimal ST elevation, anterior leads.     Past Medical History:  Diagnosis Date   Hypertension     Social History:  has no history on file for tobacco use, alcohol use, and drug use.  Allergies  Allergen Reactions   Penicillins     MEDICATIONS:                                                                                                                     Prior to Admission:  No medications prior to admission.   Scheduled:  aspirin  325 mg Oral Daily   atorvastatin  80 mg Oral Daily   Continuous:  sodium chloride 75 mL/hr at 10/24/20 0002     ROS:                                                                                                                                       Unable to obtain due to AMS.   Blood pressure (!) 165/71, pulse 67, temperature (!) 97.5 F  (36.4 C), temperature source Oral, resp. rate 16, height 5\' 2"  (1.575 m), weight 69.9 kg, SpO2 98 %.  General Examination:                                                                                                      Physical Exam  HEENT-  Skull asymmetry due to hyperostosis. No trauma seen.    Lungs- Respirations unlabored Extremities- No edema  Neurological Examination (performed after an initial period of being asymptomatic in the ED and while on the medical floor, followed by new onset symptoms of expressive and receptive aphasia) Mental Status:  Awake and alert with eyes open. Attentive to RNs and examiner but unable to follow  most verbal commands - will follow some pantomimed commands. Speech output was sparse and mostly unintelligible, hypophonic but not dysarthric. Does not answer any questions but appears to be making an effort to answer.   Cranial Nerves: II: Visual fields cannot be formally tested due to her dysphasia. Will track a moving object to left and right. Will blink to threat inconsistently in her temporal fields, but with more inconsistency when testing her right temporal visual field. PERRL.   III,IV, VI: EOMI are intact horizontally with saccadic pursuits noted. With upgaze, left eye supraducts less than the right. No nystagmus.  V,VII: Unable to formally touch temperature sensation, but reacts to touch bilaterally. Right facial droop is prominent when smiling.  VIII: Hearing intact to some commands.  IX,X: Unable to assess. Not following commands for mouth opening.  XI: Head is midline XII: Did not protrude tongue.  Motor: RUE 5/5 grip, biceps, triceps and deltoid.  LUE 5/5 grip, biceps, triceps and deltoid. However, there is drift of the limb when eyes are closed.  RLE 5/5 hip and knee flexion LLE 5/5 hip and knee flexion Sensory: Reacts to touch x 4 but does not answer questions regarding the intensity of the touch or temperature stimuli Deep Tendon Reflexes:  2+ and symmetric bilateral brachioradialis and biceps Cerebellar: Not following commands for testing.  Gait: Deferred due to falls risk concerns   Lab Results: Basic Metabolic Panel: Recent Labs  Lab 10/23/20 1138 10/23/20 1206  NA 139 141  K 3.6 3.5  CL 108 109  CO2 21*  --   GLUCOSE 94 91  BUN 11 12  CREATININE 0.67 0.70  CALCIUM 9.2  --     CBC: Recent Labs  Lab 10/23/20 1138 10/23/20 1206  WBC 6.8  --   HGB 13.4 12.9  HCT 42.5 38.0  MCV 90.8  --   PLT 213  --     Cardiac Enzymes: No results for input(s): CKTOTAL, CKMB, CKMBINDEX, TROPONINI in the last 168 hours.  Lipid Panel: No results for input(s): CHOL, TRIG, HDL, CHOLHDL, VLDL, LDLCALC in the last 168 hours.  Imaging: CT Angio Head W/Cm &/Or Wo Cm  Result Date: 10/23/2020 CLINICAL DATA:  Altered mental status. EXAM: CT ANGIOGRAPHY HEAD AND NECK TECHNIQUE: Multidetector CT imaging of the head and neck was performed using the standard protocol during bolus administration of intravenous contrast. Multiplanar CT image reconstructions and MIPs were obtained to evaluate the vascular anatomy. Carotid stenosis measurements (when applicable) are obtained utilizing NASCET criteria, using the distal internal carotid diameter as the denominator. CONTRAST:  20mL OMNIPAQUE IOHEXOL 350 MG/ML SOLN COMPARISON:  None. FINDINGS: CT HEAD FINDINGS Brain: There is a 2.2 x 2.2 x 2.4 cm partially calcified extra-axial mass anteriorly along the falx in the left frontal region with evidence of very mild edema in the left frontal lobe. This mass is associated with extensive dural ossification/hyperostosis anteriorly and laterally over the left frontal convexity with mass effect on the underlying left frontal lobe. Separate foci of dural calcification/ossification are noted more superiorly along the falx. There is also an 8 mm focus of fat along the inner aspect of the anterior left frontal hyperostosis. There is trace rightward midline shift at  the level of the mass. There is mild mass effect on the left lateral ventricle. There is no evidence of hydrocephalus, an acute infarct, intracranial hemorrhage, or extra-axial fluid collection. A small right cerebellar infarct is favored to be chronic. Vascular: Calcified atherosclerosis at the skull base. Skull:  Extensive left frontal hyperostosis. Sinuses: Partially visualized mucosal thickening and likely fluid in the maxillary sinuses. Clear mastoid air cells. Orbits: Unremarkable. 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 Wide patency of both subclavian arteries with mild nonstenotic plaque in the proximal left subclavian artery. Right carotid system: Patent with mild atheromatous wall thickening in the mid and distal common carotid artery. No evidence of a dissection or significant stenosis. Left carotid system: Patent with mild atheromatous wall thickening in the mid and distal common carotid artery and mild calcified plaque at the carotid bifurcation. No evidence of dissection or significant stenosis. Vertebral arteries: Patent without evidence of stenosis or dissection. Strongly dominant left vertebral artery. Skeleton: Mild disc and moderate facet degeneration in the cervical spine. Other neck: Bilateral thyroid nodules including a 2.3 cm nodule on the right. No evidence of cervical lymphadenopathy Upper chest: Left greater than right apical lung scarring. Review of the MIP images confirms the above findings CTA HEAD FINDINGS Anterior circulation: The internal carotid arteries are patent from skull base to carotid termini with mild atherosclerotic plaque bilaterally not resulting in a significant stenosis. ACAs and MCAs are patent without evidence of a proximal branch occlusion. There are moderate proximal right M1 and severe distal left  M1 stenoses. Mild branch vessel irregularity is present bilaterally. No aneurysm is identified. Posterior circulation: The intracranial vertebral arteries are widely patent to the basilar with the right being diminutive distal to the PICA origin. Patent PICA, AICA, and SCA origins are identified bilaterally. The basilar artery is widely patent. Both PCAs are patent with moderate left greater than right P2 segment stenoses. No aneurysm is identified. Venous sinuses: Narrowed but patent superior sagittal sinus by dural ossification. Anatomic variants: Hypoplastic right vertebral artery. Review of the MIP images confirms the above findings IMPRESSION: 1. 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 Mild edema in the left frontal lobe. Trace local rightward midline shift. Brain MRI without and with contrast is recommended for further evaluation. 2. No evidence of an acute infarct or intracranial hemorrhage. 3. No large vessel occlusion. 4. Intracranial atherosclerosis including moderate right and severe left M1 stenoses and moderate bilateral P2 stenoses. 5. Widely patent cervical carotid and vertebral arteries. 6. 2.3 cm right thyroid nodule. Recommend thyroid US (ref: J Am Coll Radiol. 2015 Feb;12(2): 143-50). Electronically Signed   By: Logan Bores M.D.   On: 10/23/2020 14:06   CT Angio Neck W and/or Wo Contrast  Result Date: 10/23/2020 CLINICAL DATA:  Altered mental status. EXAM: CT ANGIOGRAPHY HEAD AND NECK TECHNIQUE: Multidetector CT imaging of the head and neck was performed using the standard protocol during bolus administration of intravenous contrast. Multiplanar CT image reconstructions and MIPs were obtained to evaluate the vascular anatomy. Carotid stenosis measurements (when applicable) are obtained utilizing NASCET criteria, using the distal internal carotid diameter as the denominator. CONTRAST:  29mL OMNIPAQUE IOHEXOL 350 MG/ML SOLN  COMPARISON:  None. FINDINGS: CT HEAD FINDINGS Brain: There is a 2.2 x 2.2 x 2.4 cm partially calcified extra-axial mass anteriorly along the falx in the left frontal region with evidence of very mild edema in the left frontal lobe. This mass is associated with extensive dural ossification/hyperostosis anteriorly and laterally over the left frontal convexity with mass effect on the underlying left frontal lobe. Separate foci of dural calcification/ossification  are noted more superiorly along the falx. There is also an 8 mm focus of fat along the inner aspect of the anterior left frontal hyperostosis. There is trace rightward midline shift at the level of the mass. There is mild mass effect on the left lateral ventricle. There is no evidence of hydrocephalus, an acute infarct, intracranial hemorrhage, or extra-axial fluid collection. A small right cerebellar infarct is favored to be chronic. Vascular: Calcified atherosclerosis at the skull base. Skull: Extensive left frontal hyperostosis. Sinuses: Partially visualized mucosal thickening and likely fluid in the maxillary sinuses. Clear mastoid air cells. Orbits: Unremarkable. 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 Wide patency of both subclavian arteries with mild nonstenotic plaque in the proximal left subclavian artery. Right carotid system: Patent with mild atheromatous wall thickening in the mid and distal common carotid artery. No evidence of a dissection or significant stenosis. Left carotid system: Patent with mild atheromatous wall thickening in the mid and distal common carotid artery and mild calcified plaque at the carotid bifurcation. No evidence of dissection or significant stenosis. Vertebral arteries: Patent without evidence of stenosis or dissection. Strongly dominant left vertebral  artery. Skeleton: Mild disc and moderate facet degeneration in the cervical spine. Other neck: Bilateral thyroid nodules including a 2.3 cm nodule on the right. No evidence of cervical lymphadenopathy Upper chest: Left greater than right apical lung scarring. Review of the MIP images confirms the above findings CTA HEAD FINDINGS Anterior circulation: The internal carotid arteries are patent from skull base to carotid termini with mild atherosclerotic plaque bilaterally not resulting in a significant stenosis. ACAs and MCAs are patent without evidence of a proximal branch occlusion. There are moderate proximal right M1 and severe distal left M1 stenoses. Mild branch vessel irregularity is present bilaterally. No aneurysm is identified. Posterior circulation: The intracranial vertebral arteries are widely patent to the basilar with the right being diminutive distal to the PICA origin. Patent PICA, AICA, and SCA origins are identified bilaterally. The basilar artery is widely patent. Both PCAs are patent with moderate left greater than right P2 segment stenoses. No aneurysm is identified. Venous sinuses: Narrowed but patent superior sagittal sinus by dural ossification. Anatomic variants: Hypoplastic right vertebral artery. Review of the MIP images confirms the above findings IMPRESSION: 1. 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 Mild edema in the left frontal lobe. Trace local rightward midline shift. Brain MRI without and with contrast is recommended for further evaluation. 2. No evidence of an acute infarct or intracranial hemorrhage. 3. No large vessel occlusion. 4. Intracranial atherosclerosis including moderate right and severe left M1 stenoses and moderate bilateral P2 stenoses. 5. Widely patent cervical carotid and vertebral arteries. 6. 2.3 cm right thyroid nodule. Recommend thyroid US (ref: J Am Coll Radiol. 2015 Feb;12(2): 143-50).  Electronically Signed   By: Logan Bores M.D.   On: 10/23/2020 14:06   MR Brain W and Wo Contrast  Result Date: 10/23/2020 CLINICAL DATA:  TIA. Altered mental status. Left frontal parafalcine mass on CT. EXAM: MRI HEAD WITHOUT AND WITH CONTRAST TECHNIQUE: Multiplanar, multiecho pulse sequences of the brain and surrounding structures were obtained without and with intravenous contrast. CONTRAST:  63mL GADAVIST GADOBUTROL 1 MMOL/ML IV SOLN COMPARISON:  Head and neck CTA 10/23/2020 FINDINGS: Brain: There are subcentimeter acute infarcts in the posterior  left insula and left parietal subcortical white matter (MCA territory). There is are small chronic bilateral cerebellar infarcts. There is no intracranial hemorrhage, hydrocephalus, or extra-axial fluid collection. Scattered small foci of T2 hyperintensity in the cerebral white matter bilaterally are nonspecific but compatible with mild chronic small vessel ischemic disease. An avidly enhancing extra-axial mass along the left anterior falx in the frontal region measures 2.6 x 2.2 x 2.5 cm (AP x transverse x craniocaudal) with mild edema in the adjacent left frontal white matter and minimal localized rightward midline shift. 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 This extends inferiorly and laterally into the anterior and middle cranial fossae, and there is a 1.1 cm enhancing component which projects into the left sylvian fissure. A separate enhancing calcified mass along the left aspect of the falx in the posterior frontal region measures 1.5 cm. There is mild diffuse dural thickening along the upper portion of the falx. Vascular: Major intracranial vascular flow voids are preserved. Skull and upper cervical spine: Extensive left frontal hyperostosis. Sinuses/Orbits: Unremarkable orbits. Paranasal sinuses and mastoid air cells are clear. Other: None.  IMPRESSION: 1. 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 2. 2.6 cm enhancing left frontal parafalcine mass consistent with a meningioma with mild edema and minimal rightward midline shift. This is associated with extensive dural ossification and left frontal skull hyperostosis along the anterior and lateral cerebral convexities. 3. Smaller meningiomas along the falx in the posterior left frontal region and laterally in the left sylvian fissure. 4. 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 Electronically Signed   By: Logan Bores M.D.   On: 10/23/2020 16:08    Assessment: 70 year old female presenting with AMS. Symptoms resolved but MRI revealed small strokes scattered in the left MCA territory in the context of severe left M1 stenosis seen on CTA. Of note, she also has multiple left hemisphere meningiomas, the largest being associated with mild edema and minimal rightward shift. Neurosurgery has been consulted for this. She was started on ASA. Subsequently, she developed acute onset of receptive and expressive aphasia at about 6:00 AM. STAT repeat CT shows likely stroke recurrence which will need to be further evaluated with repeat MRI.  1. MRI on admission reveals subcentimeter acute left MCA infarcts in the left insula and parietal lobe as well as a 2.6 cm enhancing left frontal parafalcine mass consistent with a meningioma with mild edema and minimal rightward midline shift; additional smaller meningiomas are seen.  2. CTA on admission reveals ntracranial atherosclerosis including moderate right and severe left M1 stenoses and moderate bilateral P2 stenoses. The acute infarctions are felt most likely to be secondary to the severe left M1 stenoses via either a hypoperfusion mechanism or in situ thrombosis followed by recanalization, versus artery to artery embolization from possibly unstable atherosclerotic plaque.  3. AMS etiology  may be due to the acute strokes, but the semiology of the event at home is also suggestive of a partial complex seizure. If seizure, then the underlying epileptogenic lesion may be either the meningioma or the acute strokes.  4. EEG reveals intermittent left temporal slowing. The findings are suggestive of cortical dysfunction in the left temporal region which is non specific but could be secondary to underlying structural abnormality. No seizures or epileptiform discharges were seen throughout the recording. 5. Neurosurgery is consulting. No acute neurosurgical  intervention is indicated for the meningiomas per their consult note, but she will need to follow up with them as an outpatient. The patient has been loaded with empiric/prophylactic Keppra 1000 mg IV x 1.  6. Repeat CT head after onset of aphasia at 6:00 AM reveals new hypodensity suspicious for new Left PCA territory ischemia in the occipital pole; however, this might be streak artifact related to the complex left skull lesion. Cytotoxic edema now evident at the posterior left insula and mildly larger than the DWI lesion yesterday.  Recommendations: 1. Start Keppra 500 mg IV BID (ordered) 2. STAT repeat MRI of brain without contrast to assess for recurrent stroke. Based on exam findings, suspect acute new infarction in the left perisylvian region with possible involvement of the optic radiations.  3. Administer AM dose of ASA 325 mg and also will start Plavix. Will need to be on DAPT indefinitely due to unstable neurological symptoms most likely secondary to recurrent stroke.  4. Discussed the patient with Dr. Estanislado Pandy who is being consulted for 4 vessel diagnostic angiogram and possible stenting of the severely stenotic left M1.  5. Permissive HTN. Treat if SBP > 220. 6. Continue IVF to support optimal brain perfusion 7. Will sign out to AM stroke team.    Electronically signed: Dr. Kerney Elbe 10/23/2020, 7:37 PM

## 2020-10-23 NOTE — ED Notes (Signed)
Patient transported to MRI 

## 2020-10-23 NOTE — Consult Note (Signed)
Neurosurgery Consultation  Reason for Consult: Right sided weakness, aphasia Referring Physician: Kathrynn Humble  CC: Weakness / aphasia  HPI: This is a 70 y.o. woman that presents with a roughly 1 hour period today of right sided arm / leg weakness. Hasn't had Sx like this before, no aura prior to Sx, no associated signs / sx, no clonic / tonic movement. Her husband is here with her, she speaks [place] but primary tongue is Hindi, they speak with each other in a combination of Hindi / [place] / [place]. All languages were globally depressed with WFD but she was able to understand him and the speech output she had was sensical. While I was in the room, she did have some waxing/waning of her speech.    ROS: A 68 point ROS was performed and is negative except as noted in the HPI.   PMHx:  Past Medical History:  Diagnosis Date  . Hypertension    FamHx: No family history on file. SocHx:  has no history on file for tobacco use, alcohol use, and drug use.  Exam: Vital signs in last 24 hours: Temp:  [97.5 F (36.4 C)] 97.5 F (36.4 C) (03/13 1125) Pulse Rate:  [59-72] 61 (03/13 1700) Resp:  [14-23] 14 (03/13 1700) BP: (130-200)/(63-99) 195/74 (03/13 1700) SpO2:  [97 %-100 %] 100 % (03/13 1700) Weight:  [69.9 kg] 69.9 kg (03/13 1126) General: Somnolent, cooperative, lying in bed in NAD Head: atruamatic, +obvious left frontal hyperostosis HEENT: Neck supple Pulmonary: breathing room air comfortably, no evidence of increased work of breathing Cardiac: RRR Abdomen: S NT ND Extremities: Warm and well perfused x4 Neuro: AOx3, PERRL, EOMI, +UMN R facial droop Strength 5/5 x4, SILTx4, no drift She has some word finding difficulty in [place] but her husband said her speech sounds normal to him in multiple languages right now.   Assessment and Plan: 70 y.o. woman w/ 1h of aphasia and R sided weakness, . MRI brain personally reviewed, which shows diffuse left hemispheric convexity hyperostosis,  2.5cm frontal mass c/w meningiomas and smaller meningiomas along the falx and laterally in the left convexity. There is some mild edema and brain compresison / mass effect bu tno frank herniation. The hyperostosis is quite impressive. There are some scattered left sided MCA diffusion changes c/w acute infarcts. The patient is right handed.  -no acute neurosurgical intervention indicated at this time, will need follow up with me for the meningiomas, but we should true her ischemic strokes first, meningiomas are slow growing and can be addressed at a later date -okay from my standpoint for anti-platelet / anti-coagulant medications as needed with respect to the meningiomas -please call with any concerns or questions  Judith Part, MD 10/24/66 5:29 PM [facility]

## 2020-10-23 NOTE — Progress Notes (Signed)
EEG complete - results pending 

## 2020-10-24 ENCOUNTER — Observation Stay (HOSPITAL_COMMUNITY): Payer: Medicare HMO

## 2020-10-24 DIAGNOSIS — G40209 Localization-related (focal) (partial) symptomatic epilepsy and epileptic syndromes with complex partial seizures, not intractable, without status epilepticus: Secondary | ICD-10-CM | POA: Diagnosis present

## 2020-10-24 DIAGNOSIS — I69351 Hemiplegia and hemiparesis following cerebral infarction affecting right dominant side: Secondary | ICD-10-CM | POA: Diagnosis not present

## 2020-10-24 DIAGNOSIS — R739 Hyperglycemia, unspecified: Secondary | ICD-10-CM | POA: Diagnosis not present

## 2020-10-24 DIAGNOSIS — I160 Hypertensive urgency: Secondary | ICD-10-CM | POA: Diagnosis present

## 2020-10-24 DIAGNOSIS — I63512 Cerebral infarction due to unspecified occlusion or stenosis of left middle cerebral artery: Secondary | ICD-10-CM | POA: Diagnosis present

## 2020-10-24 DIAGNOSIS — R1312 Dysphagia, oropharyngeal phase: Secondary | ICD-10-CM | POA: Diagnosis present

## 2020-10-24 DIAGNOSIS — M852 Hyperostosis of skull: Secondary | ICD-10-CM | POA: Diagnosis present

## 2020-10-24 DIAGNOSIS — G8191 Hemiplegia, unspecified affecting right dominant side: Secondary | ICD-10-CM | POA: Diagnosis present

## 2020-10-24 DIAGNOSIS — I6389 Other cerebral infarction: Secondary | ICD-10-CM

## 2020-10-24 DIAGNOSIS — I63312 Cerebral infarction due to thrombosis of left middle cerebral artery: Secondary | ICD-10-CM | POA: Diagnosis not present

## 2020-10-24 DIAGNOSIS — R29810 Facial weakness: Secondary | ICD-10-CM | POA: Diagnosis present

## 2020-10-24 DIAGNOSIS — R29709 NIHSS score 9: Secondary | ICD-10-CM | POA: Diagnosis present

## 2020-10-24 DIAGNOSIS — R4701 Aphasia: Secondary | ICD-10-CM | POA: Diagnosis present

## 2020-10-24 DIAGNOSIS — E876 Hypokalemia: Secondary | ICD-10-CM | POA: Diagnosis present

## 2020-10-24 DIAGNOSIS — E041 Nontoxic single thyroid nodule: Secondary | ICD-10-CM | POA: Diagnosis present

## 2020-10-24 DIAGNOSIS — I639 Cerebral infarction, unspecified: Secondary | ICD-10-CM | POA: Diagnosis not present

## 2020-10-24 DIAGNOSIS — I672 Cerebral atherosclerosis: Secondary | ICD-10-CM | POA: Diagnosis present

## 2020-10-24 DIAGNOSIS — I6602 Occlusion and stenosis of left middle cerebral artery: Secondary | ICD-10-CM | POA: Diagnosis not present

## 2020-10-24 DIAGNOSIS — D329 Benign neoplasm of meninges, unspecified: Secondary | ICD-10-CM | POA: Diagnosis not present

## 2020-10-24 DIAGNOSIS — Z823 Family history of stroke: Secondary | ICD-10-CM | POA: Diagnosis not present

## 2020-10-24 DIAGNOSIS — Z20822 Contact with and (suspected) exposure to covid-19: Secondary | ICD-10-CM | POA: Diagnosis present

## 2020-10-24 DIAGNOSIS — D72829 Elevated white blood cell count, unspecified: Secondary | ICD-10-CM | POA: Diagnosis not present

## 2020-10-24 DIAGNOSIS — G936 Cerebral edema: Secondary | ICD-10-CM | POA: Diagnosis present

## 2020-10-24 DIAGNOSIS — R339 Retention of urine, unspecified: Secondary | ICD-10-CM | POA: Diagnosis not present

## 2020-10-24 DIAGNOSIS — I69391 Dysphagia following cerebral infarction: Secondary | ICD-10-CM | POA: Diagnosis not present

## 2020-10-24 DIAGNOSIS — R4702 Dysphasia: Secondary | ICD-10-CM | POA: Diagnosis present

## 2020-10-24 DIAGNOSIS — I63412 Cerebral infarction due to embolism of left middle cerebral artery: Secondary | ICD-10-CM | POA: Diagnosis not present

## 2020-10-24 DIAGNOSIS — D320 Benign neoplasm of cerebral meninges: Secondary | ICD-10-CM | POA: Diagnosis present

## 2020-10-24 DIAGNOSIS — I6300 Cerebral infarction due to thrombosis of unspecified precerebral artery: Secondary | ICD-10-CM | POA: Diagnosis not present

## 2020-10-24 DIAGNOSIS — E785 Hyperlipidemia, unspecified: Secondary | ICD-10-CM | POA: Diagnosis present

## 2020-10-24 DIAGNOSIS — I10 Essential (primary) hypertension: Secondary | ICD-10-CM | POA: Diagnosis not present

## 2020-10-24 LAB — COMPREHENSIVE METABOLIC PANEL
ALT: 15 U/L (ref 0–44)
AST: 18 U/L (ref 15–41)
Albumin: 3.5 g/dL (ref 3.5–5.0)
Alkaline Phosphatase: 53 U/L (ref 38–126)
Anion gap: 11 (ref 5–15)
BUN: 14 mg/dL (ref 8–23)
CO2: 20 mmol/L — ABNORMAL LOW (ref 22–32)
Calcium: 9.2 mg/dL (ref 8.9–10.3)
Chloride: 106 mmol/L (ref 98–111)
Creatinine, Ser: 0.69 mg/dL (ref 0.44–1.00)
GFR, Estimated: >60 mL/min (ref >60)
Glucose, Bld: 143 mg/dL — ABNORMAL HIGH (ref 70–99)
Potassium: 3.7 mmol/L (ref 3.5–5.1)
Sodium: 137 mmol/L (ref 135–145)
Total Bilirubin: 0.6 mg/dL (ref 0.3–1.2)
Total Protein: 6.6 g/dL (ref 6.5–8.1)

## 2020-10-24 LAB — LIPID PANEL
Cholesterol: 248 mg/dL — ABNORMAL HIGH (ref 0–200)
HDL: 77 mg/dL (ref >40)
LDL Cholesterol: 159 mg/dL — ABNORMAL HIGH (ref 0–99)
Total CHOL/HDL Ratio: 3.2 RATIO
Triglycerides: 58 mg/dL (ref <150)
VLDL: 12 mg/dL (ref 0–40)

## 2020-10-24 LAB — CBC WITH DIFFERENTIAL/PLATELET
Abs Immature Granulocytes: 0.02 10*3/uL (ref 0.00–0.07)
Basophils Absolute: 0 10*3/uL (ref 0.0–0.1)
Basophils Relative: 0 %
Eosinophils Absolute: 0 10*3/uL (ref 0.0–0.5)
Eosinophils Relative: 0 %
HCT: 38.1 % (ref 36.0–46.0)
Hemoglobin: 12.8 g/dL (ref 12.0–15.0)
Immature Granulocytes: 0 %
Lymphocytes Relative: 18 %
Lymphs Abs: 1.1 10*3/uL (ref 0.7–4.0)
MCH: 28.7 pg (ref 26.0–34.0)
MCHC: 33.6 g/dL (ref 30.0–36.0)
MCV: 85.4 fL (ref 80.0–100.0)
Monocytes Absolute: 0.1 10*3/uL (ref 0.1–1.0)
Monocytes Relative: 2 %
Neutro Abs: 5.2 10*3/uL (ref 1.7–7.7)
Neutrophils Relative %: 80 %
Platelets: 220 10*3/uL (ref 150–400)
RBC: 4.46 MIL/uL (ref 3.87–5.11)
RDW: 12.2 % (ref 11.5–15.5)
WBC: 6.5 10*3/uL (ref 4.0–10.5)
nRBC: 0 % (ref 0.0–0.2)

## 2020-10-24 LAB — ECHOCARDIOGRAM COMPLETE
AR max vel: 2.09 cm2
AV Area VTI: 1.94 cm2
AV Area mean vel: 1.88 cm2
AV Mean grad: 3 mmHg
AV Peak grad: 5.1 mmHg
Ao pk vel: 1.13 m/s
Area-P 1/2: 2.87 cm2
Height: 62 in
MV VTI: 1.63 cm2
S' Lateral: 2.9 cm
Weight: 2464 oz

## 2020-10-24 LAB — HEMOGLOBIN A1C
Hgb A1c MFr Bld: 5.6 % (ref 4.8–5.6)
Mean Plasma Glucose: 114.02 mg/dL

## 2020-10-24 LAB — HIV ANTIBODY (ROUTINE TESTING W REFLEX): HIV Screen 4th Generation wRfx: NONREACTIVE

## 2020-10-24 LAB — MAGNESIUM: Magnesium: 1.8 mg/dL (ref 1.7–2.4)

## 2020-10-24 MED ORDER — ATORVASTATIN CALCIUM 80 MG PO TABS
80.0000 mg | ORAL_TABLET | Freq: Every day | ORAL | Status: DC
  Administered 2020-10-25: 80 mg via ORAL
  Filled 2020-10-24 (×3): qty 1

## 2020-10-24 MED ORDER — LEVETIRACETAM IN NACL 500 MG/100ML IV SOLN
500.0000 mg | Freq: Two times a day (BID) | INTRAVENOUS | Status: DC
  Administered 2020-10-24 – 2020-10-27 (×6): 500 mg via INTRAVENOUS
  Filled 2020-10-24 (×7): qty 100

## 2020-10-24 MED ORDER — SODIUM CHLORIDE 0.9 % IV SOLN: INTRAVENOUS | Status: AC

## 2020-10-24 MED ORDER — ASPIRIN 325 MG PO TABS
325.0000 mg | ORAL_TABLET | Freq: Every day | ORAL | Status: DC
  Administered 2020-10-24 – 2020-10-25 (×2): 325 mg via ORAL
  Filled 2020-10-24 (×3): qty 1

## 2020-10-24 MED ORDER — PERFLUTREN LIPID MICROSPHERE
1.0000 mL | INTRAVENOUS | Status: AC | PRN
  Administered 2020-10-24: 3 mL via INTRAVENOUS
  Filled 2020-10-24: qty 10

## 2020-10-24 MED ORDER — CLOPIDOGREL BISULFATE 75 MG PO TABS
75.0000 mg | ORAL_TABLET | Freq: Every day | ORAL | Status: DC
  Administered 2020-10-24 – 2020-10-25 (×2): 75 mg via ORAL
  Filled 2020-10-24 (×3): qty 1

## 2020-10-24 NOTE — Procedures (Signed)
Echo attempted. Will attempt again later and patient has returned from MRI per conversation with nurse Hinton Dyer.

## 2020-10-24 NOTE — Progress Notes (Signed)
  Echocardiogram 2D Echocardiogram has been performed with Definity.  Brittany Griffith 10/24/2020, 12:04 PM

## 2020-10-24 NOTE — Evaluation (Signed)
Clinical/Bedside Swallow Evaluation Patient Details  Name: Brittany Griffith MRN: [id number] Date of Birth: 11-10-1950  Today's Date: 10/24/2020 Time: SLP Start Time (ACUTE ONLY): 1600 SLP Stop Time (ACUTE ONLY): 1615 SLP Time Calculation (min) (ACUTE ONLY): 15 min  Past Medical History:  Past Medical History:  Diagnosis Date  . Hypercholesteremia   . Hypertension   . Osteopenia    Past Surgical History: History reviewed. No pertinent surgical history. HPI:  Patient is a 70 y.o. female with PMH: HLD, HTN, osteopenia who presented to hospital with complaints of speech difficulty and right sided weakness but on EMS arrival symptoms had completely resolved. In ER later in the day, MD reported patient started to have difficulty with getting her words out. 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 The next day (3/14) patient had abnormal neuro checks in AM and repeat MRI showed significant progression of acute infarct in the left MCA territory but negative for hemorrhagic transformation.   Assessment / Plan / Recommendation Clinical Impression  Patient presents with a very mild oral dysphagia with mild delay in mastication and oral transit of regular solids. No overt s/s aspiration or penetration were observed, full clearance of oral cavity was observed with solids and liquids. Swallow initiation appeared timely. Patient able to consume consecutive straw sips of thin liquids without any observed difficulty. Voice remained clear though patient did not verbalize without cues to initiate. Patient is safe to continue with Regular texture solids and thin liquids. SLP Visit Diagnosis: Dysphagia, unspecified (R13.10)    Aspiration Risk  Mild aspiration risk    Diet Recommendation Regular;Thin liquid   Liquid Administration via: Straw;Cup Medication Administration: Whole meds with liquid Supervision: Full supervision/cueing for compensatory strategies;Staff to  assist with self feeding Compensations: Slow rate;Minimize environmental distractions;Small sips/bites Postural Changes: Seated upright at 90 degrees    Other  Recommendations Oral Care Recommendations: Oral care BID   Follow up Recommendations 24 hour supervision/assistance;Inpatient Rehab      Frequency and Duration min 1 x/week  1 week       Prognosis Prognosis for Safe Diet Advancement: Good      Swallow Study   General Date of Onset: 10/23/20 HPI: Patient is a 70 y.o. female with PMH: HLD, HTN, osteopenia who presented to hospital with complaints of speech difficulty and right sided weakness but on EMS arrival symptoms had completely resolved. In ER later in the day, MD reported patient started to have difficulty with getting her words out. 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 The next day (3/14) patient had abnormal neuro checks in AM and repeat MRI showed significant progression of acute infarct in the left MCA territory but negative for hemorrhagic transformation. Type of Study: Bedside Swallow Evaluation Previous Swallow Assessment: None found Diet Prior to this Study: Regular;Thin liquids Temperature Spikes Noted: No Respiratory Status: Room air History of Recent Intubation: No Behavior/Cognition: Alert;Cooperative;Pleasant mood Oral Cavity Assessment: Within Functional Limits Oral Care Completed by SLP: Recent completion by staff Oral Cavity - Dentition: Adequate natural dentition Vision: Functional for self-feeding Self-Feeding Abilities: Needs set up;Needs assist Patient Positioning: Upright in bed Baseline Vocal Quality: Low vocal intensity Volitional Cough: Cognitively unable to elicit Volitional Swallow: Unable to elicit    Oral/Motor/Sensory Function Overall Oral Motor/Sensory Function: Mild impairment Facial ROM: Reduced right Facial Symmetry: Within Functional Limits Facial Strength: Reduced right Lingual ROM:  Reduced right Lingual Symmetry: Abnormal symmetry right Lingual Strength:  Within Functional Limits Mandible: Within Functional Limits   Ice Chips     Thin Liquid Thin Liquid: Within functional limits Presentation: Straw    Nectar Thick     Honey Thick     Puree Puree: Within functional limits Presentation: Spoon   Solid     Solid: Impaired Oral Phase Impairments: Impaired mastication Oral Phase Functional Implications: Impaired mastication Other Comments: mildly delayed mastication and oral transit but full clearance of oral cavity with puree, regular solids      Brittany Baller, MA, CCC-SLP Speech Therapy [facility] Acute Rehab

## 2020-10-24 NOTE — Evaluation (Signed)
Speech Language Pathology Evaluation Patient Details Name: Brittany Griffith MRN: [id number] DOB: 10/07/1950 Today's Date: 10/24/2020 Time: 6761-9509 SLP Time Calculation (min) (ACUTE ONLY): 20 min  Problem List:  Patient Active Problem List   Diagnosis Date Noted  . Acute CVA (cerebrovascular accident) ([place]) 10/24/2020  . CVA (cerebral vascular accident) ([place]) 10/23/2020  . Meningioma ([place]) 10/23/2020  . Cerebral edema ([place]) 10/23/2020  . Expressive aphasia 10/23/2020  . Hypertensive urgency 10/23/2020   Past Medical History:  Past Medical History:  Diagnosis Date  . Hypercholesteremia   . Hypertension   . Osteopenia    Past Surgical History: History reviewed. No pertinent surgical history. HPI:  Patient is a 70 y.o. female with PMH: HLD, HTN, osteopenia who presented to hospital with complaints of speech difficulty and right sided weakness but on EMS arrival symptoms had completely resolved. In ER later in the day, MD reported patient started to have difficulty with getting her words out. 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 The next day (3/14) patient had abnormal neuro checks in AM and repeat MRI showed significant progression of acute infarct in the left MCA territory but negative for hemorrhagic transformation.   Assessment / Plan / Recommendation Clinical Impression  Speech language evaluation was limited by language barrier. When asked patient's native language, her husband replied, "She speaks [place]" and so it did seem to be a language barrier with both the husband and patient. SLP did observe patient to demonstrate s/s apraxia when following commands for oral motor movements; she required visual cues to perform basic one step commands even when husband appeared to be translating. She was able to point to objects in field of two when named with 100% but unable to point to object pictures in field of 5. She was not able to complete any  confrontational, responsive or automatic naming tasks, and would say "I can't..." and pointing to her mouth. When SLP and spouse attempted to redirect her to use non-dominant hand to pick up spoon secondary to decreased motor control of right hand/fingers, she would continue to use dominant, but impaired hand. Affect was flat overall, but patient was attentive and did not present with any significant impulsivity, agitation or unsafe behaviors. 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 Ideally, further evaluation and treatment sessions should be completed with intepreting in patient's primary language.    SLP Assessment  SLP Recommendation/Assessment: Patient needs continued Speech Lanaguage Pathology Services SLP Visit Diagnosis: Aphasia (R47.01)    Follow Up Recommendations  24 hour supervision/assistance;Inpatient Rehab    Frequency and Duration min 2x/week  2 weeks      SLP Evaluation Cognition  Overall Cognitive Status: Impaired/Different from baseline Arousal/Alertness: Awake/alert Orientation Level: Oriented to person;Other (comment) (unable to determine secondary to expressive aphasia) Attention: Sustained Sustained Attention: Appears intact Memory:  (unable to assess secondary to aphasia) Awareness: Impaired Awareness Impairment: Intellectual impairment Problem Solving: Impaired Problem Solving Impairment: Functional basic Executive Function: Sequencing Sequencing: Impaired Sequencing Impairment: Functional basic       Comprehension  Auditory Comprehension Overall Auditory Comprehension: Impaired Commands: Impaired One Step Basic Commands: 25-49% accurate Interfering Components: Processing speed EffectiveTechniques: Extra processing time;Visual/Gestural cues Visual Recognition/Discrimination Discrimination: Not tested Reading Comprehension Reading  Status: Not tested    Expression Expression Primary Mode of Expression: Verbal Verbal Expression Overall Verbal Expression: Impaired Initiation: Impaired Level of  Generative/Spontaneous Verbalization: Word Naming: Impairment Responsive: 0-25% accurate Confrontation: Impaired Convergent: Not tested Divergent: Not tested Verbal Errors: Other (comment);Semantic paraphasias (patient demonstrated some awareness, telling SLP "I can't...." and pointing to her mouth) Pragmatics: Impairment Impairments: Abnormal affect Non-Verbal Means of Communication: Not applicable Written Expression Dominant Hand: Right Written Expression: Not tested   Oral / Motor  Oral Motor/Sensory Function Overall Oral Motor/Sensory Function: Mild impairment Facial ROM: Reduced right Facial Symmetry: Within Functional Limits Facial Strength: Reduced right Lingual ROM: Reduced right Lingual Symmetry: Abnormal symmetry right Lingual Strength: Within Functional Limits Mandible: Within Functional Limits Motor Speech Overall Motor Speech: Other (comment) (difficult to assess as patient spoke limited English) Respiration: Within functional limits Phonation: Normal Resonance: Within functional limits   [place], MA, CCC-SLP Speech Therapy [facility] Acute Rehab

## 2020-10-24 NOTE — Significant Event (Signed)
Rapid Response Event Note   Reason for Call : Neuro change (right droop and aphasia) Initial Focused Assessment:  Nursing staff notified me about Brittany Griffith's inability to speak.  Previous NIH score was 1 (1b). Pt now with right facial droop, and global aphasia. NIHSS 9.  Dr. Cyd Silence, Dr. Zada Finders and Dr. Cheral Marker notified. Dr. Cheral Marker came to bedside.     Interventions:  -Stat Head CT    MD Notified: Dr. Cyd Silence Call Time: [phone] Arrival Time: 0520 End Time: 0630  Madelynn Done, RN

## 2020-10-24 NOTE — Progress Notes (Signed)
PROGRESS NOTE  Elinor Kleine YDX:[id number] DOB: 02-Feb-1951 DOA: 10/23/2020 PCP: [facility] @ [place]  HPI/Recap of past 24 hours: Margarete Horace is a 70 y.o. female with Past medical history of hyperlipidemia and hypertension and osteopenia. Patient presented with complaints of speech difficulty and right-sided weakness. History was primarily obtained by husband.  Patient started having difficulty with right-sided weakness around 9:30 AM.  Husband found the patient slumped over in a chair.  Patient was initially nonverbal but tracking with the eyes.  Had significant weakness of her right side upper extremity and lower extremity.  Also had facial drooping on the right side.  On EMS arrival symptom completely resolved.  While initially the symptoms resolved in the ER, later on the day, patient started having difficulty with getting the words out.   CT head showed a meningioma.  Neurosurgery was consulted.  Patient was given a gram of Keppra and was recommended for observation given the severity of the mass.  Due to intermittent aphasic complaint neurology was consulted for further evaluation.  MRI brain showed acute stroke involving left MCA in the left insula and parietal lobe.  Patient was out of the window for TPA.  10/24/20: Due to abnormal neuro checks this morning, MRI brain was repeated which showed significant progression of acute infarct in the left MCA territory.  Negative for hemorrhagic transformation.  Seen and examined at her bedside.  She has expressive aphasia.  Assessment/Plan: Principal Problem:   CVA (cerebral vascular accident) [facility]) Active Problems:   Meningioma ([place])   Cerebral edema (HCC)   Expressive aphasia   Hypertensive urgency  Progressive acute left MCA CVA.   Presents with acute onset of difficulty speaking, confusion as well as right-sided weakness. Found to have acute left MCA infarcts in the left insular and parietal lobe seen on MRI  done on 10/23/2020. Admitted for stroke work-up. Neurology/stroke team consulted and following. Due to abnormal neuro checks this morning MRI brain was repeated which showed significant progression of acute infarct in the left MCA territory.   Negative for hemorrhagic transformation. Continue frequent neurochecks. Continue to closely monitor on neuro telemetry Fasting LDL 159 on 10/25/2018, goal less than 70. Hemoglobin A1c 5.6 on 10/24/2020, goal less than 7.0. Ongoing permissive hypertension. She is currently on aspirin 325 mg daily, Plavix 75 mg daily, Lipitor 80 mg daily. She was cleared by neurosurgery for antiplatelet therapy. Neurosurgery recommended outpatient follow-up. 2D echo is pending.  Follow results. PT/OT/speech therapy assessment are pending.  Follow recommendations. Continue fall and aspiration precautions.  Meningioma Neurosurgery consulted Significant left hemispheric convexity with hyperostosis. Mild cerebral edema and brain compression. No frank herniation. No intervention recommended by neurosurgery. Keppra was given will await for further recommendation from neurology as well as neurosurgery.  With regards to antiseizure medication as well as steroids. She was cleared by neurosurgery for antiplatelet therapy. Neurosurgery recommended outpatient follow-up.  Hyperlipidemia Fasting LDL 159 with goal LDL less than 70 Lipitor 80 mg daily.  Hypertensive urgency. Ongoing permissive hypertension. Continue to closely monitor vital signs.    Nutrition: N.p.o. pending swallowing evaluation  DVT Prophylaxis: SCD, pharmacological prophylaxis contraindicated due to Concern for bleeding  Advance goals of care discussion: Full code   Consults: Neurosurgery and neurology/stroke team.  Family Communication:  None at bedside at the time of this visit.  Disposition:  From: Home    Code Status: Full code.    Disposition Plan: Likely DC to home with home  health services or CIR.  Procedures:  2D echo.  Antimicrobials:  None.   Status is: Observation    Dispo:  Patient From: Home  Planned Disposition: Home  Anticipated discharge date: 10/25/2020.  Medically stable for discharge: No work-up for syncope.       Objective: Vitals:   10/24/20 0241 10/24/20 0455 10/24/20 0634 10/24/20 1213  BP: 126/67 (!) 160/69 (!) 171/60 (!) 164/69  Pulse: 66 66 62 63  Resp: 16 16  16   Temp: 97.7 F (36.5 C) 97.8 F (36.6 C) 98.4 F (36.9 C) (!) 97.3 F (36.3 C)  TempSrc: Oral Oral Oral Oral  SpO2:  94%  97%  Weight:      Height:        Intake/Output Summary (Last 24 hours) at 10/24/2020 1303 Last data filed at 10/24/2020 0002 Gross per 24 hour  Intake 0 ml  Output 350 ml  Net -350 ml   Filed Weights   10/23/20 1126  Weight: 69.9 kg    Exam:  . General: 70 y.o. year-old female well developed well nourished in no acute distress.  Alert and follows commands.  Expressive aphasia. . Cardiovascular: Regular rate and rhythm with no rubs or gallops.  No thyromegaly or JVD noted.   Marland Kitchen Respiratory: Clear to auscultation with no wheezes or rales.  Poor inspiratory effort. . Abdomen: Soft nontender nondistended with normal bowel sounds x4 quadrants. . Musculoskeletal: No lower extremity edema.  . Skin: No ulcerative lesions noted or rashes. . Psychiatry: Mood is appropriate for condition and setting   Data Reviewed: CBC: Recent Labs  Lab 10/23/20 1138 10/23/20 1206 10/24/20 0448  WBC 6.8  --  6.5  NEUTROABS  --   --  5.2  HGB 13.4 12.9 12.8  HCT 42.5 38.0 38.1  MCV 90.8  --  85.4  PLT 213  --  353   Basic Metabolic Panel: Recent Labs  Lab 10/23/20 1138 10/23/20 1206 10/24/20 0448  NA 139 141 137  K 3.6 3.5 3.7  CL 108 109 106  CO2 21*  --  20*  GLUCOSE 94 91 143*  BUN 11 12 14   CREATININE 0.67 0.70 0.69  CALCIUM 9.2  --  9.2  MG  --   --  1.8   GFR: Estimated Creatinine Clearance: 62.5 mL/min (by C-G formula  based on SCr of 0.69 mg/dL). Liver Function Tests: Recent Labs  Lab 10/23/20 1138 10/24/20 0448  AST 22 18  ALT 17 15  ALKPHOS 54 53  BILITOT 1.0 0.6  PROT 6.9 6.6  ALBUMIN 3.8 3.5   No results for input(s): LIPASE, AMYLASE in the last 168 hours. No results for input(s): AMMONIA in the last 168 hours. Coagulation Profile: No results for input(s): INR, PROTIME in the last 168 hours. Cardiac Enzymes: No results for input(s): CKTOTAL, CKMB, CKMBINDEX, TROPONINI in the last 168 hours. BNP (last 3 results) No results for input(s): PROBNP in the last 8760 hours. HbA1C: Recent Labs    10/24/20 0448  HGBA1C 5.6   CBG: Recent Labs  Lab 10/23/20 1126  GLUCAP 98   Lipid Profile: Recent Labs    10/24/20 0448  CHOL 248*  HDL 77  LDLCALC 159*  TRIG 58  CHOLHDL 3.2   Thyroid Function Tests: No results for input(s): TSH, T4TOTAL, FREET4, T3FREE, THYROIDAB in the last 72 hours. Anemia Panel: No results for input(s): VITAMINB12, FOLATE, FERRITIN, TIBC, IRON, RETICCTPCT in the last 72 hours. Urine analysis:    Component Value Date/Time   COLORURINE STRAW (A)  10/23/2020 1430   APPEARANCEUR CLEAR 10/23/2020 1430   LABSPEC 1.027 10/23/2020 1430   PHURINE 6.0 10/23/2020 1430   GLUCOSEU NEGATIVE 10/23/2020 1430   HGBUR NEGATIVE 10/23/2020 1430   BILIRUBINUR NEGATIVE 10/23/2020 1430   KETONESUR NEGATIVE 10/23/2020 1430   PROTEINUR NEGATIVE 10/23/2020 1430   NITRITE NEGATIVE 10/23/2020 1430   LEUKOCYTESUR NEGATIVE 10/23/2020 1430   Sepsis Labs: @LABRCNTIP (procalcitonin:4,lacticidven:4)  ) Recent Results (from the past 240 hour(s))  Resp Panel by RT-PCR (Flu A&B, Covid) Nasopharyngeal Swab     Status: None   Collection Time: 10/23/20 12:28 PM   Specimen: Nasopharyngeal Swab; Nasopharyngeal(NP) swabs in vial transport medium  Result Value Ref Range Status   SARS Coronavirus 2 by RT PCR NEGATIVE NEGATIVE Final    Comment: (NOTE) SARS-CoV-2 target nucleic acids are NOT  DETECTED.  The SARS-CoV-2 RNA is generally detectable in upper respiratory specimens during the acute phase of infection. The lowest concentration of SARS-CoV-2 viral copies this assay can detect is 138 copies/mL. A negative result does not preclude SARS-Cov-2 infection and should not be used as the sole basis for treatment or other patient management decisions. A negative result may occur with  improper specimen collection/handling, submission of specimen other than nasopharyngeal swab, presence of viral mutation(s) within the areas targeted by this assay, and inadequate number of viral copies(<138 copies/mL). A negative result must be combined with clinical observations, patient history, and epidemiological information. The expected result is Negative.  Fact Sheet for Patients:  EntrepreneurPulse.com.au  Fact Sheet for Healthcare Providers:  IncredibleEmployment.be  This test is no t yet approved or cleared by the [place] FDA and  has been authorized for detection and/or diagnosis of SARS-CoV-2 by FDA under an Emergency Use Authorization (EUA). This EUA will remain  in effect (meaning this test can be used) for the duration of the COVID-19 declaration under Section 564(b)(1) of the Act, 21 U.S.C.section 360bbb-3(b)(1), unless the authorization is terminated  or revoked sooner.       Influenza A by PCR NEGATIVE NEGATIVE Final   Influenza B by PCR NEGATIVE NEGATIVE Final    Comment: (NOTE) The Xpert Xpress SARS-CoV-2/FLU/RSV plus assay is intended as an aid in the diagnosis of influenza from Nasopharyngeal swab specimens and should not be used as a sole basis for treatment. Nasal washings and aspirates are unacceptable for Xpert Xpress SARS-CoV-2/FLU/RSV testing.  Fact Sheet for Patients: EntrepreneurPulse.com.au  Fact Sheet for Healthcare Providers: IncredibleEmployment.be  This test is not yet  approved or cleared by the [place] FDA and has been authorized for detection and/or diagnosis of SARS-CoV-2 by FDA under an Emergency Use Authorization (EUA). This EUA will remain in effect (meaning this test can be used) for the duration of the COVID-19 declaration under Section 564(b)(1) of the Act, 21 U.S.C. section 360bbb-3(b)(1), unless the authorization is terminated or revoked.  Performed at [facility], [place] [address], [place], [place] [address]       Studies: CT HEAD WO CONTRAST  Result Date: 10/24/2020 CLINICAL DATA:  70 year old female with worsening NIH stroke scale. Punctate left MCA territory infarcts on MRI yesterday. EXAM: CT HEAD WITHOUT CONTRAST TECHNIQUE: Contiguous axial images were obtained from the base of the skull through the vertex without intravenous contrast. COMPARISON:  Brain MRI, CTA head and neck yesterday. FINDINGS: Brain: Suspicious appearance now of the left occipital pole suggesting new cytotoxic edema there (series 4, image 12). Streak artifact from the complex skull lesion is felt less likely. Cytotoxic edema is identified  in the posterior insula and appears larger than the DWI lesion there yesterday. But other gray-white matter differentiation in the left MCA territory appears stable. No acute intracranial hemorrhage identified. No ventriculomegaly. Chronic small cerebellar infarcts appear stable. Complex left frontal convexity meningioma(s) and associated mass effect appear stable. Possible mild anterior left frontal lobe vasogenic edema is stable. Vascular: Calcified atherosclerosis at the skull base. No suspicious intracranial vascular hyperdensity. 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 No acute osseous abnormality identified. Sinuses/Orbits: Stable. Small maxillary sinus fluid levels and mucoperiosteal thickening. Other: Stable orbit and scalp soft tissues. IMPRESSION: 1. Appearance suspicious  for new Left PCA territory ischemia in the occipital pole. However, this might be streak artifact related to the complex left skull lesion. 2. Cytotoxic edema now evident at the posterior left insula and mildly larger than the DWI lesion yesterday. 3. Stable brain otherwise. No acute hemorrhage or increased intracranial mass effect. 4. Stable complex left frontal convexity meningioma(s) and skull hyperostosis. Electronically Signed   By: Genevie Ann M.D.   On: 10/24/2020 06:13   MR BRAIN WO CONTRAST  Result Date: 10/24/2020 CLINICAL DATA:  Stroke. Neuro change, with right facial droop and aphasia. EXAM: MRI HEAD WITHOUT CONTRAST TECHNIQUE: Multiplanar, multiecho pulse sequences of the brain and surrounding structures were obtained without intravenous contrast. COMPARISON:  MRI head 10/23/2020.  CT head 10/24/2020 FINDINGS: Brain: Restricted diffusion compatible with acute infarct on the left has progressed. This involves the posterior insula as well as the deep white matter in the left posterior frontal lobe. Small areas of cortical infarct also new. No infarct in the left occipital lobe as questioned on CT. Small chronic infarct in the right cerebellum. No intracranial hemorrhage. Left hemispheric extra-axial mass is partially ossified and also has areas of fatty transformation. This is predominantly in the left frontal lobe but extends to the posterior parietal lobe. There is extensive hyperostosis of the left frontal bone. Mild mass-effect on the left frontal lobe. No midline shift. Ventricle size normal. Vascular: Normal arterial flow voids. Skull and upper cervical spine: Extensive hyperostosis left frontal bone consistent with meningioma. Sinuses/Orbits: Mucosal edema and air-fluid levels in the sphenoid sinus bilaterally. Normal orbit. Other: None IMPRESSION: Acute infarct in the left MCA territory with significant progression since yesterday. This involves the insula and deep white matter with small areas of  cortical infarct. 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 No associated brain edema. Electronically Signed   By: Franchot Gallo M.D.   On: 10/24/2020 09:35   MR Brain W and Wo Contrast  Result Date: 10/23/2020 CLINICAL DATA:  TIA. Altered mental status. Left frontal parafalcine mass on CT. EXAM: MRI HEAD WITHOUT AND WITH CONTRAST TECHNIQUE: Multiplanar, multiecho pulse sequences of the brain and surrounding structures were obtained without and with intravenous contrast. CONTRAST:  82mL GADAVIST GADOBUTROL 1 MMOL/ML IV SOLN COMPARISON:  Head and neck CTA 10/23/2020 FINDINGS: Brain: There are subcentimeter acute infarcts in the posterior left insula and left parietal subcortical white matter (MCA territory). There is are small chronic bilateral cerebellar infarcts. There is no intracranial hemorrhage, hydrocephalus, or extra-axial fluid collection. Scattered small foci of T2 hyperintensity in the cerebral white matter bilaterally are nonspecific but compatible with mild chronic small vessel ischemic disease. An avidly enhancing extra-axial mass along the left anterior falx in the frontal region measures 2.6 x  2.2 x 2.5 cm (AP x transverse x craniocaudal) with mild edema in the adjacent left frontal white matter and minimal localized rightward midline shift. 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 This extends inferiorly and laterally into the anterior and middle cranial fossae, and there is a 1.1 cm enhancing component which projects into the left sylvian fissure. A separate enhancing calcified mass along the left aspect of the falx in the posterior frontal region measures 1.5 cm. There is mild diffuse dural thickening along  the upper portion of the falx. Vascular: Major intracranial vascular flow voids are preserved. Skull and upper cervical spine: Extensive left frontal hyperostosis. Sinuses/Orbits: Unremarkable orbits. Paranasal sinuses and mastoid air cells are clear. Other: None. IMPRESSION: 1. 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 2. 2.6 cm enhancing left frontal parafalcine mass consistent with a meningioma with mild edema and minimal rightward midline shift. This is associated with extensive dural ossification and left frontal skull hyperostosis along the anterior and lateral cerebral convexities. 3. Smaller meningiomas along the falx in the posterior left frontal region and laterally in the left sylvian fissure. 4. 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 Electronically Signed   By: Logan Bores M.D.   On: 10/23/2020 16:08   EEG adult  Result Date: 10/23/2020 [name], MD     10/23/2020  7:38 PM Patient Name: Emiyah Spraggins MRN: [id number] Epilepsy Attending: [name] Referring Physician/Provider: DR Lesleigh Noe Date: 10/23/2020 Duration: 30.35 mins Patient history: 70 y.o. woman w/ 1h of aphasia and R sided weakness. EEG to evaluate for seizure Level of alertness: Awake, asleep AEDs during EEG study: Technical aspects: This EEG study was done with scalp electrodes positioned according to the 10-20 International system of electrode placement. Electrical activity was acquired at a sampling rate of 500Hz  and reviewed with a high frequency filter of 70Hz  and a low frequency filter of 1Hz . EEG data were recorded continuously and digitally stored. Description: The posterior dominant rhythm consists of 9-10 Hz activity of moderate voltage (25-35 uV) seen predominantly in posterior head regions, symmetric and reactive to eye opening and eye closing. Sleep was characterized by vertex waves, sleep spindles (12 to 14 Hz), maximal  frontocentral region. EEG showed intermitten tleft temporal 2-3hz  delta slowing.  Hyperventilation and photic stimulation were not performed.   ABNORMALITY -Intermittent slow, left temporal IMPRESSION: This study is suggestive of cortical dysfunction in left temporal region which is non specific but could be secondary to underlying structural abnormality. No seizures or epileptiform discharges were seen throughout the recording. Priyanka Barbra Sarks    Scheduled Meds: . aspirin  325 mg Oral Daily  . atorvastatin  80 mg Oral Daily  . clopidogrel  75 mg Oral Daily    Continuous Infusions: . sodium chloride 75 mL/hr at 10/24/20 0002  . levETIRAcetam 500 mg (10/24/20 3419)     LOS: 0 days     Kayleen Memos, MD [facility] Pager [phone]  If 7PM-7AM, please contact night-coverage [email or web address] Password [facility] 10/24/2020, 1:03 PM

## 2020-10-24 NOTE — Progress Notes (Signed)
PT Cancellation Note  Patient Details Name: Marshae Azam MRN: [id number] DOB: 1950-12-27   Cancelled Treatment:    Reason Eval/Treat Not Completed: Patient at procedure or test/unavailable  Pt off the unit; Will reattempt;   Roney Marion, PT  Acute Rehabilitation Services Pager [phone] Office [phone]    [place] 10/24/2020, 10:01 AM

## 2020-10-24 NOTE — Progress Notes (Signed)
STROKE TEAM PROGRESS NOTE   INTERVAL HISTORY Her husband, son, and daughter-in-law were at the bedside.  Patient presented with expressive aphasia and symptoms got worse after admission.  Initial MRI showed small patchy left insular and frontal infarct and repeat MRI shows extension of the infarct.  CT angiogram on admission showed high-grade near occlusive stenosis of distal left middle cerebral artery with patent flow distally.  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  Vitals:   10/24/20 0241 10/24/20 0455 10/24/20 0634 10/24/20 1213  BP: 126/67 (!) 160/69 (!) 171/60 (!) 164/69  Pulse: 66 66 62 63  Resp: 16 16  16   Temp: 97.7 F (36.5 C) 97.8 F (36.6 C) 98.4 F (36.9 C) (!) 97.3 F (36.3 C)  TempSrc: Oral Oral Oral Oral  SpO2:  94%  97%  Weight:      Height:       CBC:  Recent Labs  Lab 10/23/20 1138 10/23/20 1206 10/24/20 0448  WBC 6.8  --  6.5  NEUTROABS  --   --  5.2  HGB 13.4 12.9 12.8  HCT 42.5 38.0 38.1  MCV 90.8  --  85.4  PLT 213  --  681   Basic Metabolic Panel:  Recent Labs  Lab 10/23/20 1138 10/23/20 1206 10/24/20 0448  NA 139 141 137  K 3.6 3.5 3.7  CL 108 109 106  CO2 21*  --  20*  GLUCOSE 94 91 143*  BUN 11 12 14   CREATININE 0.67 0.70 0.69  CALCIUM 9.2  --  9.2  MG  --   --  1.8   Lipid Panel:  Recent Labs  Lab 10/24/20 0448  CHOL 248*  TRIG 58  HDL 77  CHOLHDL 3.2  VLDL 12  LDLCALC 159*   HgbA1c:  Recent Labs  Lab 10/24/20 0448  HGBA1C 5.6   Urine Drug Screen:  Recent Labs  Lab 10/23/20 1430  LABOPIA NONE DETECTED  COCAINSCRNUR NONE DETECTED  LABBENZ NONE DETECTED  AMPHETMU NONE DETECTED  THCU NONE DETECTED  LABBARB NONE DETECTED     IMAGING past 24 hours  MRI BRAIN WO 10/24/2020 Acute infarct in the left MCA territory with significant progression since yesterday. This  involves the insula and deep white matter with small areas of cortical infarct. 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 No associated brain edema.  CT HEAD WO CONTRAST  Result Date: 10/24/2020 1. Appearance suspicious for new Left PCA territory ischemia in the occipital pole. However, this might be streak artifact related to the complex left skull lesion.  2. Cytotoxic edema now evident at the posterior left insula and mildly larger than the DWI lesion yesterday.  3. Stable brain otherwise. No acute hemorrhage or increased intracranial mass effect. 4. Stable complex left frontal convexity meningioma(s) and skull hyperostosis.   MR BRAIN WO CONTRAST  Result Date: 10/24/2020 IMPRESSION: Acute infarct in the left MCA territory with significant progression since yesterday. This involves the insula and deep white matter with small areas of cortical infarct. 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 No associated brain edema.   MR  Brain W and Wo Contrast  Result Date: 10/23/2020 IMPRESSION:  1. 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  2. 2.6 cm enhancing left frontal parafalcine mass consistent with a meningioma with mild edema and minimal rightward midline shift. This is associated with extensive dural ossification and left frontal skull hyperostosis along the anterior and lateral cerebral convexities. 3. Smaller meningiomas along the falx in the posterior left frontal region and laterally in the left sylvian fissure.  4. 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  CTA  HEAD/NECK IMPRESSION: 1. 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 Mild edema in the left frontal lobe. Trace local rightward midline shift. Brain MRI without and with contrast is recommended for further evaluation. 2. No evidence of an acute infarct or intracranial hemorrhage. 3. No large vessel occlusion. 4. Intracranial atherosclerosis including moderate right and severe left M1 stenoses and moderate bilateral P2 stenoses. 5. Widely patent cervical carotid and vertebral arteries. 6. 2.3 cm right thyroid nodule. Recommend thyroid US (ref: J Am Coll  EEG adult Result Date: 10/23/2020 IMPRESSION: This study is suggestive of cortical dysfunction in left temporal region which is non specific but could be secondary to underlying structural abnormality. No seizures or epileptiform discharges were seen throughout the recording. [name]    PHYSICAL EXAM Physical Exam  HEENT-  Skull asymmetry due to hyperostosis. No trauma seen.    Lungs- Respirations unlabored Extremities- No edema  Neurological Examination  Mental Status:  Awake and alert with eyes open. examiner but unable to follow most verbal commands - will follow one step commands only. Speech output was sparse and mostly unintelligible, hypophonic but not dysarthric. Does not answer any questions but appears to be making an effort to answer and nodding appropriately   Cranial Nerves: II: Visual fields cannot be formally tested due asphasia. Will track a moving object to left and right. Will blink to threat. PERRL.   III,IV, VI: EOMI are intact horizontally with saccadic pursuits noted. With upgaze, left eye supraducts less than the right. No nystagmus.  V,VII: Unable to formally touch temperature sensation, but reacts to touch bilaterally. Right facial droop is prominent when smiling.  VIII: Hearing intact to some commands.  IX,X: Unable to assess. Not  following commands for mouth opening.  XI: Head is midline XII: Did not protrude tongue.  Motor: RUE 4/5 grip, biceps, triceps and deltoid. Drift resent LUE 4+/5 grip, biceps, triceps and deltoid. RLE 5/5 hip and knee flexion LLE 5/5 hip and knee flexion Sensory: Reacts to touch x 4 but does not answer questions regarding the intensity of the touch or temperature stimuli Cerebellar: ataxia on FN bilaterally Gait: Deferred due to falls risk concerns     ASSESSMENT/PLAN Ms. Ammara Raj is a 70 y.o. female with history of hypertension who was found sitting on the floor slumped over, nonverbal and unable to follow commands, but awake, tracking with her eyes and crying.  Husband then lifted his wife off the floor and noticed right-sided weakness and a right-sided facial droop. LKN was 0930. She continued to exhibit the same symptoms on EMS arrival to the home, with resolution approximately 20 minutes later.  MRI revealed small strokes scattered in the left MCA territory in the context of severe left M1 stenosis seen on CTA. Of note, she also has multiple left hemisphere meningiomas, the largest being associated with mild edema and minimal rightward  shift.  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    CT head: left PCA territory ischemia    CTA head & neck: left frontal parafalcine mass with extensive regional hyperostosis which may reflect an en plaque meningioma. Mild edema in the left frontal lobe. Trace local rightward midline shift. Intracranial atherosclerosis including moderate right and severe left M1 stenoses and moderate bilateral P2 stenoses  MRI brain w/o: acute infarct in the left MCA territory with significant progression since yesterday. This involves the insula and deep white matter with small areas of cortical infarct. Negative for hemorrhagic transformation  MRA: ubcentimeter acute left MCA infarcts in the left insula and       parietal lobe.  2. 2.6 cm  enhancing left frontal parafalcine mass           consistent with a meningioma with mild edema and minimal                 rightward midline shift. Smaller meningiomas along the falx in the       posterior left frontal region and laterally in the left sylvian fissure.  2D Echo pending  LDL 159  HgbA1c 5.6  VTE prophylaxis - SCDs  Heart Healthy diet  No antithrombotic prior to admission, now on aspirin 81 mg daily and clopidogrel 75 mg daily.   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 No seizures or epileptiform discharges were seen throughout the recording.  Hypertension  Home meds:  none  Stable . Permissive hypertension (OK if < 220/120) but gradually normalize in 5-7 days . Long-term BP goal normotensive  Hyperlipidemia  Home meds:  none  LDL 159, goal < 70  Atorvastatin 80mg  daily  Continue statin at discharge  Other Stroke Risk Factors  Advanced Age >/= 46   Other Active Problems  2.3 cm right thyroid nodule. Recommend PCP followup and thyroid US  Hospital day # 0 I have personally obtained history,examined this patient, reviewed notes, independently viewed imaging studies, participated in medical decision making and plan of care.ROS completed by me personally and pertinent positives fully documented  I have made any additions or clarifications directly to the above note. Agree with note above.  She has presented  with aphasia due to left frontal MCA branch infarcts symptomatic from severe left middle cerebral artery stenosis.  Unfortunately she has worsened since admission.  Recommend dual antiplatelet therapy and Plavix for 3 months along with aggressive risk factor modification.  May consider elective left MCA revascularization after medical stability if the vessel is still open.  Long discussion with patient,, daughter-in-law and at the bedside and answered questions.  Discussed with Dr. [place] Crane.  Greater than 50% time during this 35-minute visit with her stroke (treatment and answering questions.  Antony Contras, MD Medical Director [place] Pager: [phone] 10/24/2020 4:32 PM   To contact Stroke Continuity provider, please refer to [email or web address] After hours, contact General Neurology

## 2020-10-24 NOTE — Consult Note (Signed)
Chief Complaint: Patient was seen in consultation today for left MCA stenosis/diagnostic cerebral arteriogram.  Referring Physician(s): Kerney Elbe (neurology)  Supervising Physician: Luanne Bras  Patient Status: [facility] - In-pt  History of Present Illness: Brittany Griffith is a 70 y.o. female with a past medical history of hypertension, hypercholesterolemia, and osteopenia. She presented to [facility] ED via EMS 10/23/2020 with complaints of right-sided weakness and speech difficulty. Per notes, symptoms resolved once EMS arrived. In ED, CT head revealed meningoma neurosurgery was consulted and patient was stated on Keppra. MR brain revealed acute CVA. She was admitted for further management. Over night, patient's aphasia progressed. Repeat MR brain revealed progression of CVA. Neurology was consulted who recommended NIR consult for possible diagnostic cerebral arteriogram for further evaluation.  MR brain 10/24/2020: 1. Acute infarct in the left MCA territory with significant progression since yesterday. This involves the insula and deep white matter with small areas of cortical infarct. Negative for hemorrhagic transformation 2. 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 No associated brain edema.  MR brain 10/23/2020: 1. 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 2. 2.6 cm enhancing left frontal parafalcine mass consistent with a meningioma with mild edema and minimal rightward midline shift. This is associated with extensive dural ossification and left frontal skull hyperostosis along the anterior and lateral cerebral convexities. 3. Smaller meningiomas along the falx in the posterior left frontal region and laterally in the left sylvian fissure. 4. Mild chronic small vessel ischemic disease with small chronic cerebellar  infarcts.  CTA head/neck 10/23/2020: 1. 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 Mild edema in the left frontal lobe. Trace local rightward midline shift. Brain MRI without and with contrast is recommended for further evaluation. 2. No evidence of an acute infarct or intracranial hemorrhage. 3. No large vessel occlusion. 4. Intracranial atherosclerosis including moderate right and severe left M1 stenoses and moderate bilateral P2 stenoses. 5. Widely patent cervical carotid and vertebral arteries. 6. 2.3 cm right thyroid nodule. Recommend thyroid US (ref: J Am Coll Radiol. 2015 Feb;12(2): 143-50).  NIR consulted by Dr. Cheral Marker for possible image-guided diagnostic cerebral arteriogram. Patient awake and alert laying in bed. Has aphasia (slightly improved) but follows simple commands- history difficult to obtain secondary to this. Husband, son, and daughter at bedside.   Past Medical History:  Diagnosis Date  . Hypercholesteremia   . Hypertension   . Osteopenia     History reviewed. No pertinent surgical history.  Allergies: Penicillins  Medications: Prior to Admission medications   Medication Sig Start Date End Date Taking? Authorizing Provider  Multiple Vitamins-Minerals (MULTIVITAMIN WITH MINERALS) tablet Take 1 tablet by mouth daily.   Yes [provider]     Family History  Problem Relation Age of Onset  . Stroke Father   . Heart attack Father   . Kidney disease Brother     Social History   Socioeconomic History  . Marital status: Married    Spouse name: Not on file  . Number of children: Not on file  . Years of education: Not on file  . Highest education level: Not on file  Occupational History  . Not on file  Tobacco Use  . Smoking status: Never Smoker  . Smokeless tobacco: Never Used  Substance and Sexual Activity  . Alcohol use: Not on file  .  Drug use: Not on file  . Sexual  activity: Not on file  Other Topics Concern  . Not on file  Social History Narrative  . Not on file   Social Determinants of Health   Financial Resource Strain: Not on file  Food Insecurity: Not on file  Transportation Needs: Not on file  Physical Activity: Not on file  Stress: Not on file  Social Connections: Not on file     Review of Systems: A 12 point ROS discussed and pertinent positives are indicated in the HPI above.  All other systems are negative.  Review of Systems  Unable to perform ROS: Other (Aphasia)    Vital Signs: BP (!) 164/69 (BP Location: Left Arm)   Pulse 63   Temp (!) 97.3 F (36.3 C) (Oral)   Resp 16   Ht 5\' 2"  (1.575 m)   Wt 154 lb (69.9 kg)   SpO2 97%   BMI 28.17 kg/m   Physical Exam Vitals and nursing note reviewed.  Constitutional:      General: She is not in acute distress.    Appearance: Normal appearance.  Cardiovascular:     Rate and Rhythm: Normal rate and regular rhythm.     Heart sounds: Normal heart sounds. No murmur heard.   Pulmonary:     Effort: Pulmonary effort is normal. No respiratory distress.     Breath sounds: Normal breath sounds. No wheezing.  Skin:    General: Skin is warm and dry.  Neurological:     Mental Status: She is alert.     Comments: Aphasia but improving (can say yes/no). Follows simple commands.      MD Evaluation Airway: WNL Heart: WNL Abdomen: WNL Chest/ Lungs: WNL ASA  Classification: 3 Mallampati/Airway Score: Two   Imaging: CT Angio Head W/Cm &/Or Wo Cm  Result Date: 10/23/2020 CLINICAL DATA:  Altered mental status. EXAM: CT ANGIOGRAPHY HEAD AND NECK TECHNIQUE: Multidetector CT imaging of the head and neck was performed using the standard protocol during bolus administration of intravenous contrast. Multiplanar CT image reconstructions and MIPs were obtained to evaluate the vascular anatomy. Carotid stenosis measurements (when applicable) are obtained utilizing NASCET criteria, using  the distal internal carotid diameter as the denominator. CONTRAST:  34mL OMNIPAQUE IOHEXOL 350 MG/ML SOLN COMPARISON:  None. FINDINGS: CT HEAD FINDINGS Brain: There is a 2.2 x 2.2 x 2.4 cm partially calcified extra-axial mass anteriorly along the falx in the left frontal region with evidence of very mild edema in the left frontal lobe. This mass is associated with extensive dural ossification/hyperostosis anteriorly and laterally over the left frontal convexity with mass effect on the underlying left frontal lobe. Separate foci of dural calcification/ossification are noted more superiorly along the falx. There is also an 8 mm focus of fat along the inner aspect of the anterior left frontal hyperostosis. There is trace rightward midline shift at the level of the mass. There is mild mass effect on the left lateral ventricle. There is no evidence of hydrocephalus, an acute infarct, intracranial hemorrhage, or extra-axial fluid collection. A small right cerebellar infarct is favored to be chronic. Vascular: Calcified atherosclerosis at the skull base. Skull: Extensive left frontal hyperostosis. Sinuses: Partially visualized mucosal thickening and likely fluid in the maxillary sinuses. Clear mastoid air cells. Orbits: Unremarkable. 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 Wide patency  of both subclavian arteries with mild nonstenotic plaque in the proximal left subclavian artery. Right carotid system: Patent with mild atheromatous wall thickening in the mid and distal common carotid artery. No evidence of a dissection or significant stenosis. Left carotid system: Patent with mild atheromatous wall thickening in the mid and distal common carotid artery and mild calcified plaque at the carotid bifurcation. No evidence of dissection or significant  stenosis. Vertebral arteries: Patent without evidence of stenosis or dissection. Strongly dominant left vertebral artery. Skeleton: Mild disc and moderate facet degeneration in the cervical spine. Other neck: Bilateral thyroid nodules including a 2.3 cm nodule on the right. No evidence of cervical lymphadenopathy Upper chest: Left greater than right apical lung scarring. Review of the MIP images confirms the above findings CTA HEAD FINDINGS Anterior circulation: The internal carotid arteries are patent from skull base to carotid termini with mild atherosclerotic plaque bilaterally not resulting in a significant stenosis. ACAs and MCAs are patent without evidence of a proximal branch occlusion. There are moderate proximal right M1 and severe distal left M1 stenoses. Mild branch vessel irregularity is present bilaterally. No aneurysm is identified. Posterior circulation: The intracranial vertebral arteries are widely patent to the basilar with the right being diminutive distal to the PICA origin. Patent PICA, AICA, and SCA origins are identified bilaterally. The basilar artery is widely patent. Both PCAs are patent with moderate left greater than right P2 segment stenoses. No aneurysm is identified. Venous sinuses: Narrowed but patent superior sagittal sinus by dural ossification. Anatomic variants: Hypoplastic right vertebral artery. Review of the MIP images confirms the above findings IMPRESSION: 1. 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 Mild edema in the left frontal lobe. Trace local rightward midline shift. Brain MRI without and with contrast is recommended for further evaluation. 2. No evidence of an acute infarct or intracranial hemorrhage. 3. No large vessel occlusion. 4. Intracranial atherosclerosis including moderate right and severe left M1 stenoses and moderate bilateral P2 stenoses. 5. Widely patent cervical carotid and vertebral  arteries. 6. 2.3 cm right thyroid nodule. Recommend thyroid US (ref: J Am Coll Radiol. 2015 Feb;12(2): 143-50). Electronically Signed   By: Logan Bores M.D.   On: 10/23/2020 14:06   CT HEAD WO CONTRAST  Result Date: 10/24/2020 CLINICAL DATA:  70 year old female with worsening NIH stroke scale. Punctate left MCA territory infarcts on MRI yesterday. EXAM: CT HEAD WITHOUT CONTRAST TECHNIQUE: Contiguous axial images were obtained from the base of the skull through the vertex without intravenous contrast. COMPARISON:  Brain MRI, CTA head and neck yesterday. FINDINGS: Brain: Suspicious appearance now of the left occipital pole suggesting new cytotoxic edema there (series 4, image 12). Streak artifact from the complex skull lesion is felt less likely. Cytotoxic edema is identified in the posterior insula and appears larger than the DWI lesion there yesterday. But other gray-white matter differentiation in the left MCA territory appears stable. No acute intracranial hemorrhage identified. No ventriculomegaly. Chronic small cerebellar infarcts appear stable. Complex left frontal convexity meningioma(s) and associated mass effect appear stable. Possible mild anterior left frontal lobe vasogenic edema is stable. Vascular: Calcified atherosclerosis at the skull base. No suspicious intracranial vascular hyperdensity. 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 No acute osseous abnormality identified. Sinuses/Orbits: Stable. Small maxillary sinus fluid levels and mucoperiosteal thickening. Other: Stable orbit and scalp soft tissues. IMPRESSION: 1. Appearance suspicious for new Left PCA territory ischemia in the occipital pole. However,  this might be streak artifact related to the complex left skull lesion. 2. Cytotoxic edema now evident at the posterior left insula and mildly larger than the DWI lesion yesterday. 3. Stable brain otherwise. No acute hemorrhage or increased  intracranial mass effect. 4. Stable complex left frontal convexity meningioma(s) and skull hyperostosis. Electronically Signed   By: Genevie Ann M.D.   On: 10/24/2020 06:13   CT Angio Neck W and/or Wo Contrast  Result Date: 10/23/2020 CLINICAL DATA:  Altered mental status. EXAM: CT ANGIOGRAPHY HEAD AND NECK TECHNIQUE: Multidetector CT imaging of the head and neck was performed using the standard protocol during bolus administration of intravenous contrast. Multiplanar CT image reconstructions and MIPs were obtained to evaluate the vascular anatomy. Carotid stenosis measurements (when applicable) are obtained utilizing NASCET criteria, using the distal internal carotid diameter as the denominator. CONTRAST:  87mL OMNIPAQUE IOHEXOL 350 MG/ML SOLN COMPARISON:  None. FINDINGS: CT HEAD FINDINGS Brain: There is a 2.2 x 2.2 x 2.4 cm partially calcified extra-axial mass anteriorly along the falx in the left frontal region with evidence of very mild edema in the left frontal lobe. This mass is associated with extensive dural ossification/hyperostosis anteriorly and laterally over the left frontal convexity with mass effect on the underlying left frontal lobe. Separate foci of dural calcification/ossification are noted more superiorly along the falx. There is also an 8 mm focus of fat along the inner aspect of the anterior left frontal hyperostosis. There is trace rightward midline shift at the level of the mass. There is mild mass effect on the left lateral ventricle. There is no evidence of hydrocephalus, an acute infarct, intracranial hemorrhage, or extra-axial fluid collection. A small right cerebellar infarct is favored to be chronic. Vascular: Calcified atherosclerosis at the skull base. Skull: Extensive left frontal hyperostosis. Sinuses: Partially visualized mucosal thickening and likely fluid in the maxillary sinuses. Clear mastoid air cells. Orbits: Unremarkable. 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 Wide patency of both subclavian arteries with mild nonstenotic plaque in the proximal left subclavian artery. Right carotid system: Patent with mild atheromatous wall thickening in the mid and distal common carotid artery. No evidence of a dissection or significant stenosis. Left carotid system: Patent with mild atheromatous wall thickening in the mid and distal common carotid artery and mild calcified plaque at the carotid bifurcation. No evidence of dissection or significant stenosis. Vertebral arteries: Patent without evidence of stenosis or dissection. Strongly dominant left vertebral artery. Skeleton: Mild disc and moderate facet degeneration in the cervical spine. Other neck: Bilateral thyroid nodules including a 2.3 cm nodule on the right. No evidence of cervical lymphadenopathy Upper chest: Left greater than right apical lung scarring. Review of the MIP images confirms the above findings CTA HEAD FINDINGS Anterior circulation: The internal carotid arteries are patent from skull base to carotid termini with mild atherosclerotic plaque bilaterally not resulting in a significant stenosis. ACAs and MCAs are patent without evidence of a proximal branch occlusion. There are moderate proximal right M1 and severe distal left M1 stenoses. Mild branch vessel irregularity is present bilaterally. No aneurysm is identified. Posterior circulation: The intracranial vertebral arteries are widely patent to the basilar with the right being diminutive distal to the PICA origin. Patent PICA, AICA, and SCA origins are identified bilaterally. The basilar artery is widely patent. Both PCAs are patent with moderate  left greater than right P2 segment stenoses. No aneurysm is identified. Venous sinuses: Narrowed but patent superior sagittal sinus by dural ossification. Anatomic  variants: Hypoplastic right vertebral artery. Review of the MIP images confirms the above findings IMPRESSION: 1. 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 Mild edema in the left frontal lobe. Trace local rightward midline shift. Brain MRI without and with contrast is recommended for further evaluation. 2. No evidence of an acute infarct or intracranial hemorrhage. 3. No large vessel occlusion. 4. Intracranial atherosclerosis including moderate right and severe left M1 stenoses and moderate bilateral P2 stenoses. 5. Widely patent cervical carotid and vertebral arteries. 6. 2.3 cm right thyroid nodule. Recommend thyroid US (ref: J Am Coll Radiol. 2015 Feb;12(2): 143-50). Electronically Signed   By: Logan Bores M.D.   On: 10/23/2020 14:06   MR BRAIN WO CONTRAST  Result Date: 10/24/2020 CLINICAL DATA:  Stroke. Neuro change, with right facial droop and aphasia. EXAM: MRI HEAD WITHOUT CONTRAST TECHNIQUE: Multiplanar, multiecho pulse sequences of the brain and surrounding structures were obtained without intravenous contrast. COMPARISON:  MRI head 10/23/2020.  CT head 10/24/2020 FINDINGS: Brain: Restricted diffusion compatible with acute infarct on the left has progressed. This involves the posterior insula as well as the deep white matter in the left posterior frontal lobe. Small areas of cortical infarct also new. No infarct in the left occipital lobe as questioned on CT. Small chronic infarct in the right cerebellum. No intracranial hemorrhage. Left hemispheric extra-axial mass is partially ossified and also has areas of fatty transformation. This is predominantly in the left frontal lobe but extends to the posterior parietal lobe. There is extensive hyperostosis of the left frontal bone. Mild mass-effect on the left frontal lobe. No midline shift. Ventricle size normal. Vascular: Normal arterial flow voids. Skull and upper cervical spine:  Extensive hyperostosis left frontal bone consistent with meningioma. Sinuses/Orbits: Mucosal edema and air-fluid levels in the sphenoid sinus bilaterally. Normal orbit. Other: None IMPRESSION: Acute infarct in the left MCA territory with significant progression since yesterday. This involves the insula and deep white matter with small areas of cortical infarct. 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 No associated brain edema. Electronically Signed   By: Franchot Gallo M.D.   On: 10/24/2020 09:35   MR Brain W and Wo Contrast  Result Date: 10/23/2020 CLINICAL DATA:  TIA. Altered mental status. Left frontal parafalcine mass on CT. EXAM: MRI HEAD WITHOUT AND WITH CONTRAST TECHNIQUE: Multiplanar, multiecho pulse sequences of the brain and surrounding structures were obtained without and with intravenous contrast. CONTRAST:  42mL GADAVIST GADOBUTROL 1 MMOL/ML IV SOLN COMPARISON:  Head and neck CTA 10/23/2020 FINDINGS: Brain: There are subcentimeter acute infarcts in the posterior left insula and left parietal subcortical white matter (MCA territory). There is are small chronic bilateral cerebellar infarcts. There is no intracranial hemorrhage, hydrocephalus, or extra-axial fluid collection. Scattered small foci of T2 hyperintensity in the cerebral white matter bilaterally are nonspecific but compatible with mild chronic small vessel ischemic disease. An avidly enhancing extra-axial mass along the left anterior falx in the frontal region measures 2.6 x 2.2 x 2.5 cm (AP x transverse x craniocaudal) with mild edema in the adjacent left frontal white matter and minimal localized rightward midline shift. As noted on CT, this is contiguous with extensive dural ossification and left  frontal skull hyperostosis over the anterior and lateral cerebral  convexities. This extends inferiorly and laterally into the anterior and middle cranial fossae, and there is a 1.1 cm enhancing component which projects into the left sylvian fissure. A separate enhancing calcified mass along the left aspect of the falx in the posterior frontal region measures 1.5 cm. There is mild diffuse dural thickening along the upper portion of the falx. Vascular: Major intracranial vascular flow voids are preserved. Skull and upper cervical spine: Extensive left frontal hyperostosis. Sinuses/Orbits: Unremarkable orbits. Paranasal sinuses and mastoid air cells are clear. Other: None. IMPRESSION: 1. 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 2. 2.6 cm enhancing left frontal parafalcine mass consistent with a meningioma with mild edema and minimal rightward midline shift. This is associated with extensive dural ossification and left frontal skull hyperostosis along the anterior and lateral cerebral convexities. 3. Smaller meningiomas along the falx in the posterior left frontal region and laterally in the left sylvian fissure. 4. 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 Electronically Signed   By: Logan Bores M.D.   On: 10/23/2020 16:08   EEG adult  Result Date: 10/23/2020 [name], MD     10/23/2020  7:38 PM Patient Name: Brittany Griffith MRN: [id number] Epilepsy Attending: [name] Referring Physician/Provider: DR Lesleigh Noe Date: 10/23/2020 Duration: 30.35 mins Patient history: 70 y.o. woman w/ 1h of aphasia and R sided weakness. EEG to evaluate for seizure Level of alertness: Awake, asleep AEDs during EEG study: Technical aspects: This EEG study was done with scalp electrodes positioned according to the 10-20 International system of electrode placement. Electrical activity was acquired at a sampling rate of 500Hz  and reviewed with a high frequency filter of 70Hz  and a low frequency filter of 1Hz .  EEG data were recorded continuously and digitally stored. Description: The posterior dominant rhythm consists of 9-10 Hz activity of moderate voltage (25-35 uV) seen predominantly in posterior head regions, symmetric and reactive to eye opening and eye closing. Sleep was characterized by vertex waves, sleep spindles (12 to 14 Hz), maximal frontocentral region. EEG showed intermitten tleft temporal 2-3hz  delta slowing.  Hyperventilation and photic stimulation were not performed.   ABNORMALITY -Intermittent slow, left temporal IMPRESSION: This study is suggestive of cortical dysfunction in left temporal region which is non specific but could be secondary to underlying structural abnormality. No seizures or epileptiform discharges were seen throughout the recording. Priyanka O Yadav    Labs:  CBC: Recent Labs    10/23/20 1138 10/23/20 1206 10/24/20 0448  WBC 6.8  --  6.5  HGB 13.4 12.9 12.8  HCT 42.5 38.0 38.1  PLT 213  --  220    COAGS: No results for input(s): INR, APTT in the last 8760 hours.  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 Plan for image-guided diagnostic cerebral arteriogram in IR with Dr. Estanislado Pandy tentatively for tomorrow 10/25/2020 pending IR scheduling/consent from patient's husband. Patient will be NPO at midnight. Afebrile and WBCs WNL. Ok to proceed with Plavix/Aspirin per Dr. Estanislado Pandy. INR pending.  Risks and benefits of diagnostic cerebral angiogram were discussed with the patient including, but not  limited to bleeding, infection, vascular injury, stroke, or contrast induced renal failure. This interventional procedure involves the use of X-rays and because of the nature of the planned procedure, it is possible that we will have prolonged use of X-ray fluoroscopy. Potential radiation risks to you include (but are not limited to) the following: - A slightly elevated risk for cancer  several years later in life. This risk is typically less than 0.5% percent. This risk is low in comparison to the normal incidence of human cancer, which is 33% for women and 50% for men according to the [place]. - Radiation induced injury can include skin redness, resembling a rash, tissue breakdown / ulcers and hair loss (which can be temporary or permanent).  The likelihood of either of these occurring depends on the difficulty of the procedure and whether you are sensitive to radiation due to previous procedures, disease, or genetic conditions.  IF your procedure requires a prolonged use of radiation, you will be notified and given written instructions for further action.  It is your responsibility to monitor the irradiated area for the 2 weeks following the procedure and to notify your physician if you are concerned that you have suffered a radiation induced injury.   All of the patient's husband/sons questions were answered- they wish to discuss further prior to obtaining consent.   Thank you for this interesting consult.  I greatly enjoyed meeting Brittany Griffith and look forward to participating in their care.  A copy of this report was sent to the requesting provider on this date.  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

## 2020-10-24 NOTE — Progress Notes (Signed)
OT Cancellation Note  Patient Details Name: Brittany Griffith MRN: [id number] DOB: 1950/10/09   Cancelled Treatment:    Reason Eval/Treat Not Completed: Patient at procedure or test/ unavailable Pt off unit. Will follow and see as able.  Jolaine Artist, OT Acute Rehabilitation Services Pager [phone] Office [phone]  [name] 10/24/2020, 9:21 AM

## 2020-10-24 NOTE — Evaluation (Signed)
Physical Therapy Evaluation Patient Details Name: Brittany Griffith MRN: [id number] DOB: 1951-05-28 Today's Date: 10/24/2020   History of Present Illness  Patient is a 70 y.o. female with PMH: HLD, HTN, osteopenia who presented to hospital with complaints of speech difficulty and right sided weakness but on EMS arrival symptoms had completely resolved. In ER later in the day, MD reported patient started to have difficulty with getting her words out. 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 The next day (3/14) patient had abnormal neuro checks in AM and repeat MRI showed significant progression of acute infarct in the left MCA territory but negative for hemorrhagic transformation.  Clinical Impression   Pt admitted with above diagnosis. Lives with family in a single story townhome with one step to enter; completely independent prior to admission including driving; Presents to PT with R sided weakness and dyscoordination, and apraxia effecting functional mobility, and increasing fall risk; Walked in hallway with handheld assist; Required mod assist to prevent falls with episodes of loss of balance to her R x3; Impulsive, requiring multimodal cueing for safety; Shows very good particiaption and good potential for recovery; Needs ST, OT, and PT, and recommend intensive therapies available to CIR; Pt currently with functional limitations due to the deficits listed below (see PT Problem List). Pt will benefit from skilled PT to increase their independence and safety with mobility to allow discharge to the venue listed below.       Follow Up Recommendations CIR    Equipment Recommendations  3in1 (PT);Cane;Other (comment) (will discern over PT course)    Recommendations for Other Services Rehab consult     Precautions / Restrictions Precautions Precautions: Fall Restrictions Weight Bearing Restrictions: No      Mobility  Bed Mobility Overal bed mobility:  Needs Assistance Bed Mobility: Supine to Sit;Sit to Supine     Supine to sit: Min guard Sit to supine: Min guard   General bed mobility comments: [place]inguard for safety; Did not attend to lines and leads while moving out of and back into bed; Sat up to long sit in the bed initially; multimodal cues to bring LEs around to EOB; Pt got back to bed at end of session, facing bed, with hands on the bed, then bringing L knee up onto the bed and moving through quadruped before making her way to supine    Transfers Overall transfer level: Needs assistance Equipment used: 1 person hand held assist Transfers: Sit to/from Stand Sit to Stand: Min assist         General transfer comment: Overall adequate LE strength to rise; needing handheld assist to steady; multimodal cueing to slow down to allow for line management  Ambulation/Gait Ambulation/Gait assistance: Min guard;Mod assist;+2 safety/equipment Gait Distance (Feet): 60 Feet (hallway amb) Assistive device: 1 person hand held assist;2 person hand held assist Gait Pattern/deviations: Decreased step length - right;Decreased step length - left;Drifts right/left Gait velocity: decr   General Gait Details: Handheld assist to steady; notable gait assymetries including no arm swing RUE, decr foot clearance RLE; 3 significant losses of balance to the right, needing mod assist to prevent fall  Stairs            Wheelchair Mobility    Modified Rankin (Stroke Patients Only) Modified Rankin (Stroke Patients Only) Pre-Morbid Rankin Score: No symptoms Modified Rankin: Moderately severe disability     Balance Overall balance assessment: Needs assistance  Standing balance-Leahy Scale: Poor (approaching Fair)                               Pertinent Vitals/Pain Pain Assessment: Faces Faces Pain Scale: Hurts little more Pain Location: Refers to her lower abdomen; Possibly discomfort due to holdin gurine Pain  Descriptors / Indicators: Discomfort Pain Intervention(s): Monitored during session;Other (comment) (Seemed to be slightly better after voiding)    Home Living Family/patient expects to be discharged to:: Private residence Living Arrangements: Spouse/significant other;Children Available Help at Discharge: Family Type of Home: House Home Access: Stairs to enter Entrance Stairs-Rails: None Technical brewer of Steps: 1 Home Layout: One level Home Equipment: None      Prior Function                 Hand Dominance   Dominant Hand: Right    Extremity/Trunk Assessment   Upper Extremity Assessment Upper Extremity Assessment: Defer to OT evaluation (though noted decr R hand dexterity and grossly decr strength and coordination; Decr arm swing during gait)    Lower Extremity Assessment Lower Extremity Assessment: Difficult to assess due to impaired cognition (Able to move both LEs against gravity throughout; notable dyscoordination RLE with stepping)    Cervical / Trunk Assessment Cervical / Trunk Assessment: Normal  Communication      Cognition Arousal/Alertness: Awake/alert Behavior During Therapy: WFL for tasks assessed/performed;Impulsive Overall Cognitive Status: Impaired/Different from baseline Area of Impairment: Problem solving;Awareness;Safety/judgement                               General Comments: Pt's husband reports she has been getting up OOB impulsively many times throughout the day      General Comments General comments (skin integrity, edema, etc.): Noting that pt occasionally answers questions in short phrases; Motor planning, apraxia noted; does better with visual demonstration of requested task    Exercises     Assessment/Plan    PT Assessment Patient needs continued PT services  PT Problem List Decreased strength;Decreased activity tolerance;Decreased balance;Decreased mobility;Decreased coordination;Decreased  cognition;Decreased knowledge of use of DME;Decreased safety awareness;Decreased knowledge of precautions       PT Treatment Interventions DME instruction;Gait training;Stair training;Functional mobility training;Therapeutic activities;Therapeutic exercise;Balance training;Neuromuscular re-education;Cognitive remediation;Patient/family education    PT Goals (Current goals can be found in the Care Plan section)  Acute Rehab PT Goals Patient Stated Goal: Difficulty stating; seems to enjoy getting up and walking PT Goal Formulation: With patient/family Time For Goal Achievement: 11/07/20 Potential to Achieve Goals: Good    Frequency Min 4X/week   Barriers to discharge        Co-evaluation               AM-PAC PT "6 Clicks" Mobility  Outcome Measure Help needed turning from your back to your side while in a flat bed without using bedrails?: None Help needed moving from lying on your back to sitting on the side of a flat bed without using bedrails?: None Help needed moving to and from a bed to a chair (including a wheelchair)?: A Little Help needed standing up from a chair using your arms (e.g., wheelchair or bedside chair)?: A Little Help needed to walk in hospital room?: A Lot Help needed climbing 3-5 steps with a railing? : A Lot 6 Click Score: 18    End of Session Equipment Utilized During Treatment: Gait belt Activity Tolerance:  Patient tolerated treatment well Patient left: in bed;with call bell/phone within reach;with family/visitor present;with bed alarm set Nurse Communication: Mobility status;Other (comment) (and pt voided during session) PT Visit Diagnosis: Unsteadiness on feet (R26.81);Other abnormalities of gait and mobility (R26.89);Apraxia (R48.2)    Time: 2979-8921 PT Time Calculation (min) (ACUTE ONLY): 36 min   Charges:   PT Evaluation $PT Eval Moderate Complexity: 1 Mod PT Treatments $Gait Training: 8-22 mins        Roney Marion, PT  Acute  Rehabilitation Services Pager [phone] Office [place] 10/24/2020, 7:03 PM

## 2020-10-25 ENCOUNTER — Inpatient Hospital Stay (HOSPITAL_COMMUNITY): Payer: Medicare HMO

## 2020-10-25 ENCOUNTER — Other Ambulatory Visit: Payer: Self-pay | Admitting: Student

## 2020-10-25 DIAGNOSIS — I63312 Cerebral infarction due to thrombosis of left middle cerebral artery: Secondary | ICD-10-CM

## 2020-10-25 LAB — GLUCOSE, CAPILLARY: Glucose-Capillary: 91 mg/dL (ref 70–99)

## 2020-10-25 LAB — BASIC METABOLIC PANEL
Anion gap: 7 (ref 5–15)
BUN: 10 mg/dL (ref 8–23)
CO2: 23 mmol/L (ref 22–32)
Calcium: 8.8 mg/dL — ABNORMAL LOW (ref 8.9–10.3)
Chloride: 110 mmol/L (ref 98–111)
Creatinine, Ser: 0.65 mg/dL (ref 0.44–1.00)
GFR, Estimated: >60 mL/min (ref >60)
Glucose, Bld: 99 mg/dL (ref 70–99)
Potassium: 3.5 mmol/L (ref 3.5–5.1)
Sodium: 140 mmol/L (ref 135–145)

## 2020-10-25 LAB — CBC
HCT: 33.4 % — ABNORMAL LOW (ref 36.0–46.0)
Hemoglobin: 11.9 g/dL — ABNORMAL LOW (ref 12.0–15.0)
MCH: 30 pg (ref 26.0–34.0)
MCHC: 35.6 g/dL (ref 30.0–36.0)
MCV: 84.1 fL (ref 80.0–100.0)
Platelets: 202 10*3/uL (ref 150–400)
RBC: 3.97 MIL/uL (ref 3.87–5.11)
RDW: 12.5 % (ref 11.5–15.5)
WBC: 7.6 10*3/uL (ref 4.0–10.5)
nRBC: 0 % (ref 0.0–0.2)

## 2020-10-25 LAB — PROTIME-INR
INR: 1.1 (ref 0.8–1.2)
Prothrombin Time: 13.5 seconds (ref 11.4–15.2)

## 2020-10-25 LAB — PLATELET INHIBITION P2Y12: Platelet Function  P2Y12: 249 [PRU] (ref 182–335)

## 2020-10-25 LAB — PHOSPHORUS: Phosphorus: 3.1 mg/dL (ref 2.5–4.6)

## 2020-10-25 LAB — MAGNESIUM: Magnesium: 1.8 mg/dL (ref 1.7–2.4)

## 2020-10-25 MED ORDER — ASPIRIN EC 81 MG PO TBEC
81.0000 mg | DELAYED_RELEASE_TABLET | Freq: Every day | ORAL | Status: DC
  Administered 2020-10-26: 81 mg via ORAL
  Filled 2020-10-25: qty 1

## 2020-10-25 MED ORDER — POLYETHYLENE GLYCOL 3350 17 G PO PACK: 17.0000 g | PACK | Freq: Every day | ORAL | Status: DC | PRN

## 2020-10-25 MED ORDER — TICAGRELOR 90 MG PO TABS
180.0000 mg | ORAL_TABLET | Freq: Once | ORAL | Status: AC
  Administered 2020-10-25: 180 mg via ORAL
  Filled 2020-10-25: qty 2

## 2020-10-25 MED ORDER — SENNOSIDES-DOCUSATE SODIUM 8.6-50 MG PO TABS
1.0000 | ORAL_TABLET | Freq: Every day | ORAL | Status: DC
  Administered 2020-10-25: 1 via ORAL
  Filled 2020-10-25 (×2): qty 1

## 2020-10-25 MED ORDER — IOHEXOL 350 MG/ML SOLN
50.0000 mL | Freq: Once | INTRAVENOUS | Status: AC | PRN
  Administered 2020-10-25: 50 mL via INTRAVENOUS

## 2020-10-25 MED ORDER — CHLORHEXIDINE GLUCONATE CLOTH 2 % EX PADS
6.0000 | MEDICATED_PAD | Freq: Every day | CUTANEOUS | Status: DC
  Administered 2020-10-25 – 2020-10-30 (×5): 6 via TOPICAL

## 2020-10-25 MED ORDER — TICAGRELOR 90 MG PO TABS
90.0000 mg | ORAL_TABLET | Freq: Two times a day (BID) | ORAL | Status: DC
  Administered 2020-10-26: 90 mg via ORAL
  Filled 2020-10-25: qty 1

## 2020-10-25 NOTE — Progress Notes (Signed)
NIR consulted by Dr. Cheral Marker for possible image-guided diagnostic cerebral arteriogram.  At this time, await results of CTA head/neck. Procedure on hold. Will restart patient's diet for today.  Please call NIR with questions/concerns.   Bea Graff Amjad Fikes, PA-C 10/25/2020, 10:20 AM

## 2020-10-25 NOTE — Progress Notes (Addendum)
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 Patient sitting in chair resting comfortably. She opens eyes to voice. Still with aphasia (able to nod to yes/no questions appropriately) and follows simple commands. Husband, son, and daughter-in-law at bedside.  CTA head today: 1. Evolving recent left MCA territory infarctions. No acute intracranial hemorrhage or significant mass effect. 2. Similar appearance of intracranial atherosclerosis compared to recent prior study.   Allergies: Penicillins  Medications: Prior to Admission medications   Medication Sig Start Date End Date Taking? Authorizing Provider  Multiple Vitamins-Minerals (MULTIVITAMIN WITH MINERALS) tablet Take 1 tablet by mouth daily.   Yes [provider]     Vital Signs: BP (!) 175/67 (BP Location: Left Arm) Comment: RN Notified  Pulse 60   Temp 97.8 F (36.6 C) (Oral)   Resp 17   Ht 5\' 2"  (1.575 m)   Wt 154 lb (69.9 kg)   SpO2 97%   BMI 28.17 kg/m   Physical Exam Vitals and nursing note reviewed.  Constitutional:      General: She is not in acute distress. Cardiovascular:     Rate and Rhythm: Normal rate and regular rhythm.     Heart sounds: Normal heart sounds. No murmur heard.   Pulmonary:     Effort: Pulmonary effort is normal. No respiratory distress.     Breath sounds: Normal breath sounds. No wheezing.  Skin:    General: Skin is warm and dry.  Neurological:     Comments: Drowsy. Follows simple commands. Aphasic but can nod to yes/no questions. Can spontaneously move all extremities. 3-/4 right hand grip. Right pronator drift.     Imaging: CT Angio Head W/Cm &/Or Wo Cm  Result Date: 10/23/2020 CLINICAL DATA:  Altered mental status. EXAM: CT ANGIOGRAPHY HEAD AND NECK TECHNIQUE:  Multidetector CT imaging of the head and neck was performed using the standard protocol during bolus administration of intravenous contrast. Multiplanar CT image reconstructions and MIPs were obtained to evaluate the vascular anatomy. Carotid stenosis measurements (when applicable) are obtained utilizing NASCET criteria, using the distal internal carotid diameter as the denominator. CONTRAST:  61mL OMNIPAQUE IOHEXOL 350 MG/ML SOLN COMPARISON:  None. FINDINGS: CT HEAD FINDINGS Brain: There is a 2.2 x 2.2 x 2.4 cm partially calcified extra-axial mass anteriorly along the falx in the left frontal region with evidence of very mild edema in the left frontal lobe. This mass is associated with extensive dural ossification/hyperostosis anteriorly and laterally over the left frontal convexity with mass effect on the underlying left frontal lobe. Separate foci of dural calcification/ossification are noted more superiorly along the falx. There is also an 8 mm focus of fat along the inner aspect of the anterior left frontal hyperostosis. There is trace rightward midline shift at the level of the mass. There is mild mass effect on the left lateral ventricle. There is no evidence of hydrocephalus, an acute infarct, intracranial hemorrhage, or extra-axial fluid collection. A small right cerebellar infarct is favored to be chronic. Vascular: Calcified atherosclerosis at the skull base. Skull: Extensive left frontal hyperostosis. Sinuses: Partially visualized mucosal thickening and likely fluid in the maxillary sinuses. Clear mastoid air cells. Orbits: Unremarkable. 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 Wide patency of both subclavian arteries with mild nonstenotic plaque in the proximal left subclavian artery. Right carotid system: Patent with mild atheromatous  wall thickening in the mid and distal common carotid artery. No evidence of a dissection or significant stenosis. Left carotid system: Patent with mild atheromatous wall thickening in the mid and distal common carotid artery and mild calcified plaque at the carotid bifurcation. No evidence of dissection or significant stenosis. Vertebral arteries: Patent without evidence of stenosis or dissection. Strongly dominant left vertebral artery. Skeleton: Mild disc and moderate facet degeneration in the cervical spine. Other neck: Bilateral thyroid nodules including a 2.3 cm nodule on the right. No evidence of cervical lymphadenopathy Upper chest: Left greater than right apical lung scarring. Review of the MIP images confirms the above findings CTA HEAD FINDINGS Anterior circulation: The internal carotid arteries are patent from skull base to carotid termini with mild atherosclerotic plaque bilaterally not resulting in a significant stenosis. ACAs and MCAs are patent without evidence of a proximal branch occlusion. There are moderate proximal right M1 and severe distal left M1 stenoses. Mild branch vessel irregularity is present bilaterally. No aneurysm is identified. Posterior circulation: The intracranial vertebral arteries are widely patent to the basilar with the right being diminutive distal to the PICA origin. Patent PICA, AICA, and SCA origins are identified bilaterally. The basilar artery is widely patent. Both PCAs are patent with moderate left greater than right P2 segment stenoses. No aneurysm is identified. Venous sinuses: Narrowed but patent superior sagittal sinus by dural ossification. Anatomic variants: Hypoplastic right vertebral artery. Review of the MIP images confirms the above findings IMPRESSION: 1. 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 Mild edema in the left frontal lobe. Trace local rightward midline shift. Brain MRI without and  with contrast is recommended for further evaluation. 2. No evidence of an acute infarct or intracranial hemorrhage. 3. No large vessel occlusion. 4. Intracranial atherosclerosis including moderate right and severe left M1 stenoses and moderate bilateral P2 stenoses. 5. Widely patent cervical carotid and vertebral arteries. 6. 2.3 cm right thyroid nodule. Recommend thyroid US (ref: J Am Coll Radiol. 2015 Feb;12(2): 143-50). Electronically Signed   By: Logan Bores M.D.   On: 10/23/2020 14:06   CT HEAD WO CONTRAST  Result Date: 10/24/2020 CLINICAL DATA:  70 year old female with worsening NIH stroke scale. Punctate left MCA territory infarcts on MRI yesterday. EXAM: CT HEAD WITHOUT CONTRAST TECHNIQUE: Contiguous axial images were obtained from the base of the skull through the vertex without intravenous contrast. COMPARISON:  Brain MRI, CTA head and neck yesterday. FINDINGS: Brain: Suspicious appearance now of the left occipital pole suggesting new cytotoxic edema there (series 4, image 12). Streak artifact from the complex skull lesion is felt less likely. Cytotoxic edema is identified in the posterior insula and appears larger than the DWI lesion there yesterday. But other gray-white matter differentiation in the left MCA territory appears stable. No acute intracranial hemorrhage identified. No ventriculomegaly. Chronic small cerebellar infarcts appear stable. Complex left frontal convexity meningioma(s) and associated mass effect appear stable. Possible mild anterior left frontal lobe vasogenic edema is stable. Vascular: Calcified atherosclerosis at the skull base. No suspicious intracranial vascular hyperdensity. 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 No acute osseous abnormality identified. Sinuses/Orbits: Stable. Small maxillary sinus fluid levels and mucoperiosteal thickening. Other: Stable orbit and scalp soft tissues. IMPRESSION: 1. Appearance suspicious for  new Left PCA  territory ischemia in the occipital pole. However, this might be streak artifact related to the complex left skull lesion. 2. Cytotoxic edema now evident at the posterior left insula and mildly larger than the DWI lesion yesterday. 3. Stable brain otherwise. No acute hemorrhage or increased intracranial mass effect. 4. Stable complex left frontal convexity meningioma(s) and skull hyperostosis. Electronically Signed   By: Genevie Ann M.D.   On: 10/24/2020 06:13   CT Angio Neck W and/or Wo Contrast  Result Date: 10/23/2020 CLINICAL DATA:  Altered mental status. EXAM: CT ANGIOGRAPHY HEAD AND NECK TECHNIQUE: Multidetector CT imaging of the head and neck was performed using the standard protocol during bolus administration of intravenous contrast. Multiplanar CT image reconstructions and MIPs were obtained to evaluate the vascular anatomy. Carotid stenosis measurements (when applicable) are obtained utilizing NASCET criteria, using the distal internal carotid diameter as the denominator. CONTRAST:  78mL OMNIPAQUE IOHEXOL 350 MG/ML SOLN COMPARISON:  None. FINDINGS: CT HEAD FINDINGS Brain: There is a 2.2 x 2.2 x 2.4 cm partially calcified extra-axial mass anteriorly along the falx in the left frontal region with evidence of very mild edema in the left frontal lobe. This mass is associated with extensive dural ossification/hyperostosis anteriorly and laterally over the left frontal convexity with mass effect on the underlying left frontal lobe. Separate foci of dural calcification/ossification are noted more superiorly along the falx. There is also an 8 mm focus of fat along the inner aspect of the anterior left frontal hyperostosis. There is trace rightward midline shift at the level of the mass. There is mild mass effect on the left lateral ventricle. There is no evidence of hydrocephalus, an acute infarct, intracranial hemorrhage, or extra-axial fluid collection. A small right cerebellar infarct is favored  to be chronic. Vascular: Calcified atherosclerosis at the skull base. Skull: Extensive left frontal hyperostosis. Sinuses: Partially visualized mucosal thickening and likely fluid in the maxillary sinuses. Clear mastoid air cells. Orbits: Unremarkable. 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 Wide patency of both subclavian arteries with mild nonstenotic plaque in the proximal left subclavian artery. Right carotid system: Patent with mild atheromatous wall thickening in the mid and distal common carotid artery. No evidence of a dissection or significant stenosis. Left carotid system: Patent with mild atheromatous wall thickening in the mid and distal common carotid artery and mild calcified plaque at the carotid bifurcation. No evidence of dissection or significant stenosis. Vertebral arteries: Patent without evidence of stenosis or dissection. Strongly dominant left vertebral artery. Skeleton: Mild disc and moderate facet degeneration in the cervical spine. Other neck: Bilateral thyroid nodules including a 2.3 cm nodule on the right. No evidence of cervical lymphadenopathy Upper chest: Left greater than right apical lung scarring. Review of the MIP images confirms the above findings CTA HEAD FINDINGS Anterior circulation: The internal carotid arteries are patent from skull base to carotid termini with mild atherosclerotic plaque bilaterally not resulting in a significant stenosis. ACAs and MCAs are patent without evidence of a proximal branch occlusion. There are moderate proximal right M1 and severe distal left M1 stenoses. Mild branch vessel irregularity is present bilaterally. No aneurysm is identified. Posterior circulation: The intracranial vertebral arteries are widely patent to the basilar with the right being diminutive distal to the PICA  origin. Patent PICA, AICA, and SCA origins are identified bilaterally. The basilar artery is widely  patent. Both PCAs are patent with moderate left greater than right P2 segment stenoses. No aneurysm is identified. Venous sinuses: Narrowed but patent superior sagittal sinus by dural ossification. Anatomic variants: Hypoplastic right vertebral artery. Review of the MIP images confirms the above findings IMPRESSION: 1. 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 Mild edema in the left frontal lobe. Trace local rightward midline shift. Brain MRI without and with contrast is recommended for further evaluation. 2. No evidence of an acute infarct or intracranial hemorrhage. 3. No large vessel occlusion. 4. Intracranial atherosclerosis including moderate right and severe left M1 stenoses and moderate bilateral P2 stenoses. 5. Widely patent cervical carotid and vertebral arteries. 6. 2.3 cm right thyroid nodule. Recommend thyroid US (ref: J Am Coll Radiol. 2015 Feb;12(2): 143-50). Electronically Signed   By: Logan Bores M.D.   On: 10/23/2020 14:06   MR BRAIN WO CONTRAST  Result Date: 10/24/2020 CLINICAL DATA:  Stroke. Neuro change, with right facial droop and aphasia. EXAM: MRI HEAD WITHOUT CONTRAST TECHNIQUE: Multiplanar, multiecho pulse sequences of the brain and surrounding structures were obtained without intravenous contrast. COMPARISON:  MRI head 10/23/2020.  CT head 10/24/2020 FINDINGS: Brain: Restricted diffusion compatible with acute infarct on the left has progressed. This involves the posterior insula as well as the deep white matter in the left posterior frontal lobe. Small areas of cortical infarct also new. No infarct in the left occipital lobe as questioned on CT. Small chronic infarct in the right cerebellum. No intracranial hemorrhage. Left hemispheric extra-axial mass is partially ossified and also has areas of fatty transformation.  This is predominantly in the left frontal lobe but extends to the posterior parietal lobe. There is extensive hyperostosis of the left frontal bone. Mild mass-effect on the left frontal lobe. No midline shift. Ventricle size normal. Vascular: Normal arterial flow voids. Skull and upper cervical spine: Extensive hyperostosis left frontal bone consistent with meningioma. Sinuses/Orbits: Mucosal edema and air-fluid levels in the sphenoid sinus bilaterally. Normal orbit. Other: None IMPRESSION: Acute infarct in the left MCA territory with significant progression since yesterday. This involves the insula and deep white matter with small areas of cortical infarct. 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 No associated brain edema. Electronically Signed   By: Franchot Gallo M.D.   On: 10/24/2020 09:35   MR Brain W and Wo Contrast  Result Date: 10/23/2020 CLINICAL DATA:  TIA. Altered mental status. Left frontal parafalcine mass on CT. EXAM: MRI HEAD WITHOUT AND WITH CONTRAST TECHNIQUE: Multiplanar, multiecho pulse sequences of the brain and surrounding structures were obtained without and with intravenous contrast. CONTRAST:  15mL GADAVIST GADOBUTROL 1 MMOL/ML IV SOLN COMPARISON:  Head and neck CTA 10/23/2020 FINDINGS: Brain: There are subcentimeter acute infarcts in the posterior left insula and left parietal subcortical white matter (MCA territory). There is are small chronic bilateral cerebellar infarcts. There is no intracranial hemorrhage, hydrocephalus, or extra-axial fluid collection. Scattered small foci of T2 hyperintensity in the cerebral white matter bilaterally are nonspecific but compatible with mild chronic small vessel ischemic disease. An avidly enhancing extra-axial mass along the left anterior falx in the frontal region measures  2.6 x 2.2 x 2.5 cm (AP x transverse x craniocaudal) with mild edema in the adjacent left frontal white matter and minimal localized rightward midline shift. As noted on CT, this is  contiguous with extensive dural ossification and left frontal skull hyperostosis over the anterior and lateral cerebral convexities. This extends inferiorly and laterally into the anterior and middle cranial fossae, and there is a 1.1 cm enhancing component which projects into the left sylvian fissure. A separate enhancing calcified mass along the left aspect of the falx in the posterior frontal region measures 1.5 cm. There is mild diffuse dural thickening along the upper portion of the falx. Vascular: Major intracranial vascular flow voids are preserved. Skull and upper cervical spine: Extensive left frontal hyperostosis. Sinuses/Orbits: Unremarkable orbits. Paranasal sinuses and mastoid air cells are clear. Other: None. IMPRESSION: 1. 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 2. 2.6 cm enhancing left frontal parafalcine mass consistent with a meningioma with mild edema and minimal rightward midline shift. This is associated with extensive dural ossification and left frontal skull hyperostosis along the anterior and lateral cerebral convexities. 3. Smaller meningiomas along the falx in the posterior left frontal region and laterally in the left sylvian fissure. 4. 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 Electronically Signed   By: Logan Bores M.D.   On: 10/23/2020 16:08   EEG adult  Result Date: 10/23/2020 [name], MD     10/23/2020  7:38 PM Patient Name: Hallie Ertl MRN: [id number] Epilepsy Attending: [name] Referring Physician/Provider: DR Lesleigh Noe Date: 10/23/2020 Duration: 30.35 mins Patient history: 70 y.o. woman w/ 1h of aphasia and R sided weakness. EEG to evaluate for seizure Level of alertness: Awake, asleep AEDs during  EEG study: Technical aspects: This EEG study was done with scalp electrodes positioned according to the 10-20 International system of electrode placement. Electrical activity was acquired at a sampling rate of 500Hz  and reviewed with a high frequency filter of 70Hz  and a low frequency filter of 1Hz . EEG data were recorded continuously and digitally stored. Description: The posterior dominant rhythm consists of 9-10 Hz activity of moderate voltage (25-35 uV) seen predominantly in posterior head regions, symmetric and reactive to eye opening and eye closing. Sleep was characterized by vertex waves, sleep spindles (12 to 14 Hz), maximal frontocentral region. EEG showed intermitten tleft temporal 2-3hz  delta slowing.  Hyperventilation and photic stimulation were not performed.   ABNORMALITY -Intermittent slow, left temporal IMPRESSION: This study is suggestive of cortical dysfunction in left temporal region which is non specific but could be secondary to underlying structural abnormality. No seizures or epileptiform discharges were seen throughout the recording. [name]   ECHOCARDIOGRAM COMPLETE  Result Date: 10/24/2020    ECHOCARDIOGRAM REPORT   Patient Name:   LETINA LUCKETT Date of Exam: 10/24/2020 Medical Rec #:  [id number]     Height:       62.0 in Accession #:    [id number]    Weight:       154.0 lb Date of Birth:  May 12, 1951    BSA:          66.711 m Patient Age:    26 years      BP:           171/60 mmHg Patient Gender: F             HR:           62 bpm. Exam Location:  Inpatient Procedure: 2D Echo, Cardiac Doppler, Color Doppler and Intracardiac            Opacification Agent Indications:    Stroke  History:  Patient has no prior history of Echocardiogram examinations.                 Risk Factors:Hypertension and Dyslipidemia.  Sonographer:    Clayton Lefort RDCS (AE) Referring Phys: [id number] [place]  1. Left ventricular ejection fraction, by estimation, is 60 to 65%. The left  ventricle has normal function. The left ventricle has no regional wall motion abnormalities. Left ventricular diastolic parameters are indeterminate.  2. Right ventricular systolic function is normal. The right ventricular size is normal. There is normal pulmonary artery systolic pressure.  3. Right atrial size was mildly dilated.  4. The mitral valve is normal in structure. Trivial mitral valve regurgitation.  5. The aortic valve is normal in structure. Aortic valve regurgitation is mild. No aortic stenosis is present. FINDINGS  Left Ventricle: Left ventricular ejection fraction, by estimation, is 60 to 65%. The left ventricle has normal function. The left ventricle has no regional wall motion abnormalities. Definity contrast agent was given IV to delineate the left ventricular  endocardial borders. The left ventricular internal cavity size was normal in size. There is borderline left ventricular hypertrophy. Left ventricular diastolic parameters are indeterminate. Right Ventricle: The right ventricular size is normal. No increase in right ventricular wall thickness. Right ventricular systolic function is normal. There is normal pulmonary artery systolic pressure. The tricuspid regurgitant velocity is 2.29 m/s, and  with an assumed right atrial pressure of 3 mmHg, the estimated right ventricular systolic pressure is 47.6 mmHg. Left Atrium: Left atrial size was normal in size. Right Atrium: Right atrial size was mildly dilated. Pericardium: There is no evidence of pericardial effusion. Mitral Valve: The mitral valve is normal in structure. Trivial mitral valve regurgitation. MV peak gradient, 2.9 mmHg. The mean mitral valve gradient is 1.0 mmHg. Tricuspid Valve: The tricuspid valve is grossly normal. Tricuspid valve regurgitation is trivial. Aortic Valve: The aortic valve is normal in structure. Aortic valve regurgitation is mild. No aortic stenosis is present. Aortic valve mean gradient measures 3.0 mmHg. Aortic  valve peak gradient measures 5.1 mmHg. Aortic valve area, by VTI measures 1.94 cm. Pulmonic Valve: The pulmonic valve was normal in structure. Pulmonic valve regurgitation is not visualized. Aorta: The aortic root and ascending aorta are structurally normal, with no evidence of dilitation. IAS/Shunts: The atrial septum is grossly normal.  LEFT VENTRICLE PLAX 2D LVIDd:         4.10 cm  Diastology LVIDs:         2.90 cm  LV e' medial:    5.87 cm/s LV PW:         1.30 cm  LV E/e' medial:  11.0 LV IVS:        1.10 cm  LV e' lateral:   6.85 cm/s LVOT diam:     1.70 cm  LV E/e' lateral: 9.4 LV SV:         55 LV SV Index:   32 LVOT Area:     2.27 cm  RIGHT VENTRICLE             IVC RV Basal diam:  2.80 cm     IVC diam: 1.00 cm RV S prime:     10.60 cm/s TAPSE (M-mode): 2.7 cm LEFT ATRIUM             Index       RIGHT ATRIUM           Index LA diam:  2.80 cm 1.64 cm/m  RA Area:     19.20 cm LA Vol (A2C):   25.7 ml 15.02 ml/m RA Volume:   56.90 ml  33.26 ml/m LA Vol (A4C):   43.4 ml 25.37 ml/m LA Biplane Vol: 33.8 ml 19.76 ml/m  AORTIC VALVE AV Area (Vmax):    2.09 cm AV Area (Vmean):   1.88 cm AV Area (VTI):     1.94 cm AV Vmax:           113.00 cm/s AV Vmean:          81.600 cm/s AV VTI:            0.282 m AV Peak Grad:      5.1 mmHg AV Mean Grad:      3.0 mmHg LVOT Vmax:         104.00 cm/s LVOT Vmean:        67.600 cm/s LVOT VTI:          0.241 m LVOT/AV VTI ratio: 0.85  AORTA Ao Root diam: 3.30 cm Ao Asc diam:  3.00 cm MITRAL VALVE               TRICUSPID VALVE MV Area (PHT): 2.87 cm    TR Peak grad:   21.0 mmHg MV Area VTI:   1.63 cm    TR Vmax:        229.00 cm/s MV Peak grad:  2.9 mmHg MV Mean grad:  1.0 mmHg    SHUNTS MV Vmax:       0.85 m/s    Systemic VTI:  0.24 m MV Vmean:      41.9 cm/s   Systemic Diam: 1.70 cm MV Decel Time: 264 msec MV E velocity: 64.70 cm/s MV A velocity: 82.30 cm/s MV E/A ratio:  0.79 Mertie Moores MD Electronically signed by Mertie Moores MD Signature Date/Time:  10/24/2020/3:36:07 PM    Final    CT ANGIO HEAD CODE STROKE  Result Date: 10/25/2020 CLINICAL DATA:  Left MCA stroke, follow-up EXAM: CT ANGIOGRAPHY HEAD TECHNIQUE: Multidetector CT imaging of the head was performed using the standard protocol during bolus administration of intravenous contrast. Multiplanar CT image reconstructions and MIPs were obtained to evaluate the vascular anatomy. CONTRAST:  39mL OMNIPAQUE IOHEXOL 350 MG/ML SOLN COMPARISON:  None. FINDINGS: CT HEAD Brain: There are increased foci of hypoattenuation in the left MCA territory corresponding to areas of acute infarction seen on prior MRI. There is no acute intracranial hemorrhage or significant mass effect. Additional patchy hypoattenuation in the supratentorial white matter likely reflects stable chronic microvascular ischemic changes. 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 Ventricles are stable in size. Stable appearance of calcified left parafalcine meningioma with extensive adjacent hyperostosis. Vascular: No new abnormality. Skull: Calvarium is unremarkable apart from hyperostosis. Sinuses/Orbits: No acute finding. Other: None. CTA HEAD There is greater artifact on this study as compared to recent prior. Anterior circulation: Intracranial internal carotid arteries are patent. Mixed plaque results in moderate stenosis the paraclinoid portions. Anterior and middle cerebral arteries are patent. Stable appearance of left greater than right M1 stenoses. Additional more distal MCA branch atherosclerotic irregularity is unchanged. Anterior cerebral arteries are patent. Middle and anterior cerebral arteries are patent. Posterior circulation: Intracranial vertebral, basilar, and posterior cerebral arteries are patent. Atherosclerotic irregularity of the posterior cerebral arteries with bilateral P2 segment stenoses again identified. Venous sinuses: Patent as permitted by contrast timing. Review of the MIP images confirms the above  findings IMPRESSION: Evolving  recent left MCA territory infarctions. No acute intracranial hemorrhage or significant mass effect. Similar appearance of intracranial atherosclerosis compared to recent prior study. Electronically Signed   By: Macy Mis M.D.   On: 10/25/2020 08:47    Labs:  CBC: Recent Labs    10/23/20 1138 10/23/20 1206 10/24/20 0448 10/25/20 0224  WBC 6.8  --  6.5 7.6  HGB 13.4 12.9 12.8 11.9*  HCT 42.5 38.0 38.1 33.4*  PLT 213  --  220 202    COAGS: Recent Labs    10/25/20 0224  INR 1.1    BMP: Recent Labs    10/23/20 1138 10/23/20 1206 10/24/20 0448 10/25/20 0224  NA 139 141 137 140  K 3.6 3.5 3.7 3.5  CL 108 109 106 110  CO2 21*  --  20* 23  GLUCOSE 94 91 143* 99  BUN 11 12 14 10   CALCIUM 9.2  --  9.2 8.8*  CREATININE 0.67 0.70 0.69 0.65  GFRNONAA >60  --  >60 >60    LIVER FUNCTION TESTS: Recent Labs    10/23/20 1138 10/24/20 0448  BILITOT 1.0 0.6  AST 22 18  ALT 17 15  ALKPHOS 54 53  PROT 6.9 6.6  ALBUMIN 3.8 3.5    Assessment and Plan:  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 Dr. Estanislado Pandy at bedside, discussed plans for possible revascularization procedure. At this time, patient's family agrees to move forward. Plan for image-guided cerebral arteriogram with possible revascularization (angioplasty, stent placement) of left MCA stenosis in IR with Dr. Estanislado Pandy and anesthesia tentatively for tomorrow 10/26/2020 pending IR scheduling. Patient will be NPO at midnight. Afebrile and WBCs WNL. Ok to proceed with Plavix/Aspirin per Dr. Estanislado Pandy. Will check P2Y12 today. INR 1.1 yesterday. COVID negative on admission 10/23/2020.  Risks and benefits of cerebral arteriogram with intervention were discussed with the patient including, but not limited to bleeding, infection, vascular injury, contrast induced renal failure, stroke, reperfusion hemorrhage, or even death. This interventional procedure  involves the use of X-rays and because of the nature of the planned procedure, it is possible that we will have prolonged use of X-ray fluoroscopy. Potential radiation risks to you include (but are not limited to) the following: - A slightly elevated risk for cancer  several years later in life. This risk is typically less than 0.5% percent. This risk is low in comparison to the normal incidence of human cancer, which is 33% for women and 50% for men according to the [employer]. - Radiation induced injury can include skin redness, resembling a rash, tissue breakdown / ulcers and hair loss (which can be temporary or permanent).  The likelihood of either of these occurring depends on the difficulty of the procedure and whether you are sensitive to radiation due to previous procedures, disease, or genetic conditions.  IF your procedure requires a prolonged use of radiation, you will be notified and given written instructions for further action.  It is your responsibility to monitor the irradiated area for the 2 weeks following the procedure and to notify your physician if you are concerned that you have suffered a radiation induced injury.   All of the patient's family's questions were answered, they are agreeable to proceed. Consent obtained by patient's husband, Marina Goodell, signed and in chart.   ADDENDUM: P2Y12 249 PRU. Discussed with Dr. Estanislado Pandy who recommends discontinuing Plavix/ASpirin 325 mg use, load with Brilinta 180 mg x1 tonight, begin Brilinta 90 mg twice  daily and Aspirin 81 mg once daily tomorrow. Will recheck P2Y12 tomorrow AM. Tressia Miners, RN notified.   Electronically Signed: Earley Abide, PA-C 10/25/2020, 3:38 PM   I spent a total of 35 Minutes at the the patient's bedside AND on the patient's hospital floor or unit, greater than 50% of which was counseling/coordinating care for left MCA stenosis/revascularization.

## 2020-10-25 NOTE — Progress Notes (Signed)
  Speech Language Pathology Treatment: Dysphagia  Patient Details Name: Brittany Griffith MRN: [id number] DOB: Dec 24, 1950 Today's Date: 10/25/2020 Time: 4098-1191 SLP Time Calculation (min) (ACUTE ONLY): 20 min  Assessment / Plan / Recommendation Clinical Impression  Due to lethargy as well as increased coughing and difficulty following commands this morning, SLP was asked to reassess swallowing. One cough was noted with thin liquids; however, once repositioned to be more upright no other coughing occurred especially when challenged with consecutive sips. Minimal anterior loss was noted with thin liquids as well but this was alleviated with use of a straw. Pt cleared all solids from her oral cavity and no overt s/s was noted with thicker consistencies. Recommend a regular diet and thin liquids. SLP will continue to follow.    HPI HPI: Patient is a 70 y.o. female with PMH: HLD, HTN, osteopenia who presented to hospital with complaints of speech difficulty and right sided weakness but on EMS arrival symptoms had completely resolved. In ER later in the day, MD reported patient started to have difficulty with getting her words out. 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 The next day (3/14) patient had abnormal neuro checks in AM and repeat MRI showed significant progression of acute infarct in the left MCA territory but negative for hemorrhagic transformation.      SLP Plan  Continue with current plan of care       Recommendations  Diet recommendations: Regular;Thin liquid Liquids provided via: Straw Medication Administration: Whole meds with liquid Supervision: Patient able to self feed;Full supervision/cueing for compensatory strategies Compensations: Slow rate;Minimize environmental distractions;Small sips/bites Postural Changes and/or Swallow Maneuvers: Seated upright 90 degrees                Oral Care Recommendations: Oral care BID Follow up  Recommendations: 24 hour supervision/assistance;Inpatient Rehab SLP Visit Diagnosis: Dysphagia, unspecified (R13.10) Plan: Continue with current plan of care       GO                Jeanine Luz., SLP Student 10/25/2020, 2:13 PM

## 2020-10-25 NOTE — Progress Notes (Signed)
Inpatient Rehab Admissions Coordinator:   Attempted to meet with pt at bedside x2.  Working with SLP on first visit and PT on second visit.  Will f/u tomorrow.   Shann Medal, PT, DPT Admissions Coordinator [phone] 10/25/20  2:31 PM

## 2020-10-25 NOTE — Progress Notes (Signed)
Occupational Therapy Treatment Patient Details Name: Brittany Griffith MRN: [id number] DOB: 1951-06-22 Today's Date: 10/25/2020    History of present illness Patient is a 70 y.o. female with PMH: HLD, HTN, osteopenia who presented to hospital with complaints of speech difficulty and right sided weakness but on EMS arrival symptoms had completely resolved. In ER later in the day, MD reported patient started to have difficulty with getting her words out. 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 The next day (3/14) patient had abnormal neuro checks in AM and repeat MRI showed significant progression of acute infarct in the left MCA territory but negative for hemorrhagic transformation.   OT comments  Returned to see pt with PT, as RN reports decreased lethargy.  Patient completing transfers with min guard +2 safety, functional mobility in room with mod assist +2 using RW.  Engaged in ADLs: grooming with mod assist to wash hands at sink, pt perseverating on washing face and requires cueing to sequence task with heavy R lateral lean in static standing. Demonstrates increased functional use of R UE this afternoon, but remains weak and uncoordinated with inattention to UE.  She follows commands intermittently, but improved command following when spouse speaks in [place] with visual cues given.  Cognition and vision remain difficult to assess.  Continue to recommend CIR at this time.  Will follow.    Follow Up Recommendations  CIR;Supervision/Assistance - 24 hour    Equipment Recommendations  Other (comment) (TBd at next venue of care)    Recommendations for Other Services Rehab consult;Speech consult;PT consult    Precautions / Restrictions Precautions Precautions: Fall Restrictions Weight Bearing Restrictions: No       Mobility Bed Mobility Overal bed mobility: Needs Assistance Bed Mobility: Supine to Sit;Sit to Supine     Supine to sit: Supervision Sit  to supine: Min assist   General bed mobility comments: pt comes to EOB with visual and verbal cues, uses rail but did not require physical assist. Min A to LE's for return to supine    Transfers Overall transfer level: Needs assistance Equipment used: 1 person hand held assist;Rolling walker (2 wheeled) Transfers: Sit to/from Stand Sit to Stand: Min guard         General transfer comment: pt stood with min guard +2, no physical assist but not safe with significant R lean    Balance Overall balance assessment: Needs assistance Sitting-balance support: No upper extremity supported;Feet supported Sitting balance-Leahy Scale: Fair Sitting balance - Comments: R lateral lean but no LOB in sitting Postural control: Right lateral lean Standing balance support: No upper extremity supported Standing balance-Leahy Scale: Poor Standing balance comment: R lateral lean in standing with decreased proprioception RLE resulting in no self correction                           ADL either performed or assessed with clinical judgement   ADL Overall ADL's : Needs assistance/impaired     Grooming: Wash/dry hands;Wash/dry face;Moderate assistance Grooming Details (indicate cue type and reason): standing at sink able to engage in washing hands with min assist for task sequencing/R hand attention, min-mod assist for balance and perseverating on washing face (bed level continues to perseverate on washing face and requied max assist to wash hands)                 Toilet Transfer: Moderate assistance;+2 for safety/equipment;+2 for physical assistance;Ambulation;RW  Functional mobility during ADLs: Min guard;Moderate assistance;+2 for physical assistance;+2 for safety/equipment;Rolling walker;Cueing for safety;Cueing for sequencing General ADL Comments: pt able to transition to standing with min guard, but impulsive and R lateral lean with any functional task requires mod assist  to maintain balance     Vision   Additional Comments: opens eyes and scans but limited assessment due to cognition/commuincation   Perception     Praxis      Cognition Arousal/Alertness: Lethargic;Suspect due to medications (family/RN report due to [place]) Behavior During Therapy: Flat affect Overall Cognitive Status: Difficult to assess Area of Impairment: Attention;Problem solving;Awareness;Following commands                 Orientation Level: Disoriented to;Place;Person (did not question time or situation) Current Attention Level: Focused Memory: Decreased recall of precautions;Decreased short-term memory Following Commands: Follows one step commands inconsistently;Follows one step commands with increased time Safety/Judgement: Decreased awareness of deficits;Decreased awareness of safety Awareness: Intellectual Problem Solving: Slow processing;Decreased initiation;Difficulty sequencing;Requires verbal cues;Requires tactile cues General Comments: pt with minimal verbalization throughout session but following >50% of one step commands. Noted R inattention and poor problem solving. Pt also very impulsive with gait        Exercises     Shoulder Instructions       General Comments husband present throughout. Pt followed some commands better in [place] from husband but he also used visual cues.    Pertinent Vitals/ Pain       Pain Assessment: Faces Faces Pain Scale: No hurt  Home Living                                          Prior Functioning/Environment              Frequency  Min 3X/week        Progress Toward Goals  OT Goals(current goals can now be found in the care plan section)  Progress towards OT goals: Progressing toward goals  Acute Rehab OT Goals Patient Stated Goal: pt did not state  Plan Discharge plan remains appropriate;Frequency remains appropriate    Co-evaluation    PT/OT/SLP Co-Evaluation/Treatment:  Yes Reason for Co-Treatment: Complexity of the patient's impairments (multi-system involvement);Necessary to address cognition/behavior during functional activity;For patient/therapist safety PT goals addressed during session: Mobility/safety with mobility;Balance;Proper use of DME OT goals addressed during session: ADL's and self-care      AM-PAC OT "6 Clicks" Daily Activity     Outcome Measure   Help from another person eating meals?: A Lot Help from another person taking care of personal grooming?: A Lot Help from another person toileting, which includes using toliet, bedpan, or urinal?: A Lot Help from another person bathing (including washing, rinsing, drying)?: A Lot Help from another person to put on and taking off regular upper body clothing?: A Lot Help from another person to put on and taking off regular lower body clothing?: A Lot 6 Click Score: 12    End of Session Equipment Utilized During Treatment: Gait belt;Rolling walker  OT Visit Diagnosis: Other abnormalities of gait and mobility (R26.89);Muscle weakness (generalized) (M62.81);Other symptoms and signs involving the nervous system (R29.898);Other symptoms and signs involving cognitive function;Cognitive communication deficit (R41.841) Symptoms and signs involving cognitive functions: Cerebral infarction   Activity Tolerance Patient tolerated treatment well   Patient Left in bed;with call bell/phone within reach;with bed alarm set;with family/visitor present  Nurse Communication Mobility status        Time: 2637-8588 OT Time Calculation (min): 25 min  Charges: OT General Charges $OT Visit: 1 Visit OT Treatments $Self Care/Home Management : 8-22 mins  Jolaine Artist, OT Acute Rehabilitation Services Pager [phone] Office [phone]    [name] 10/25/2020, 3:47 PM

## 2020-10-25 NOTE — Evaluation (Signed)
Occupational Therapy Evaluation Patient Details Name: Brittany Griffith MRN: [id number] DOB: 1951/07/06 Today's Date: 10/25/2020    History of Present Illness Patient is a 70 y.o. female with PMH: HLD, HTN, osteopenia who presented to hospital with complaints of speech difficulty and right sided weakness but on EMS arrival symptoms had completely resolved. In ER later in the day, MD reported patient started to have difficulty with getting her words out. 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 The next day (3/14) patient had abnormal neuro checks in AM and repeat MRI showed significant progression of acute infarct in the left MCA territory but negative for hemorrhagic transformation.   Clinical Impression   PTA patient independent, driving and working part time.  Admitted for above and limited by problem list below, including R sided weakness, impaired balance, impaired communication and cognition, decreased activity tolerance, R inattention, and coordination.  Patients assessment significantly limited by lethargy, she follows minimal commands inconsistently and requires greatly increased time.  She attempts to open her eyes throughout the session, but only raises eyebrows; able to squeeze hands (signficiantly weaker on R side). She requires mod assist for bed mobility, max-total assist for self care.  Based on performance today, recommend continued OT services while admitted and after dc at CIR level but will continue to assess based on limited assessment today.      Follow Up Recommendations  CIR;Supervision/Assistance - 24 hour    Equipment Recommendations  Other (comment) (TBD at next venue of care)    Recommendations for Other Services Rehab consult;Speech consult;PT consult     Precautions / Restrictions Precautions Precautions: Fall Restrictions Weight Bearing Restrictions: No      Mobility Bed Mobility Overal bed mobility: Needs  Assistance Bed Mobility: Supine to Sit;Sit to Supine     Supine to sit: Mod assist Sit to supine: Mod assist   General bed mobility comments: pt able to scoot body towards L side of bed with verbal cueing, but support required for trunk elevation and R leg mgmt to EOB; returned to supine with support of trunk    Transfers                 General transfer comment: deferred due to lethargy    Balance Overall balance assessment: Needs assistance Sitting-balance support: No upper extremity supported;Feet supported Sitting balance-Leahy Scale: Poor Sitting balance - Comments: min guard to min assist statically, R lateral lean noted Postural control: Right lateral lean                                 ADL either performed or assessed with clinical judgement   ADL Overall ADL's : Needs assistance/impaired     Grooming: Wash/dry face;Maximal assistance;Bed level Grooming Details (indicate cue type and reason): able to bring washcloth to face using L hand automatically but unable to sustain task requiring total assist                             Functional mobility during ADLs: Total assistance General ADL Comments: total assist at this time for all self care due to lethargy and cognition     Vision Baseline Vision/History: Wears glasses Wears Glasses: At all times Additional Comments: further assessment required, keeps eyes closed during session with only raising eyebrows     Perception Perception Comments: some R inattention, further  assessment required   Praxis      Pertinent Vitals/Pain Pain Assessment: Faces Faces Pain Scale: No hurt     Hand Dominance Right   Extremity/Trunk Assessment Upper Extremity Assessment Upper Extremity Assessment: RUE deficits/detail;Difficult to assess due to impaired cognition RUE Deficits / Details: weakness and decreased coordination, some inattention noted RUE Sensation: decreased  proprioception;decreased light touch RUE Coordination: decreased fine motor;decreased gross motor   Lower Extremity Assessment Lower Extremity Assessment: Defer to PT evaluation   Cervical / Trunk Assessment Cervical / Trunk Assessment: Normal   Communication Communication Communication: Receptive difficulties;Expressive difficulties   Cognition Arousal/Alertness: Lethargic;Suspect due to medications (family/RN report due to [place]) Behavior During Therapy: Flat affect Overall Cognitive Status: Difficult to assess Area of Impairment: Attention;Problem solving;Awareness;Following commands                   Current Attention Level: Focused   Following Commands: Follows one step commands inconsistently;Follows one step commands with increased time   Awareness: Intellectual Problem Solving: Slow processing;Decreased initiation;Difficulty sequencing;Requires verbal cues;Requires tactile cues General Comments: pt lethargic and following very minimal commands, inconsistently.  Unable to open eyes during session but attempts by raising eyebrows.  Son speaks to her in [place] (she speaks 4 languages per RN) with minimal to no improvement in following commands. Able to automatically bring washcloth to face using L hand, but unable to sustain task to fully wash face; difficulty attending to R UE and transferring washcloth from L to R.   General Comments  family at side, supportive    Exercises     Shoulder Instructions      Home Living Family/patient expects to be discharged to:: Private residence Living Arrangements: Spouse/significant other;Children Available Help at Discharge: Family;Available 24 hours/day Type of Home: House Home Access: Stairs to enter [employer] of Steps: 1 Entrance Stairs-Rails: None Home Layout: One level     Bathroom Shower/Tub: Tub/shower unit;Walk-in shower   Bathroom Toilet: Standard Bathroom Accessibility: Yes   Home Equipment:  None   Additional Comments: family reports spouse was working but not plans to stay with pt      Prior Functioning/Environment Level of Independence: Independent        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, driving, independent and very active in community        OT Problem List: Decreased strength;Decreased activity tolerance;Impaired balance (sitting and/or standing);Decreased coordination;Decreased cognition;Impaired vision/perception;Decreased range of motion;Decreased safety awareness;Decreased knowledge of use of DME or AE;Decreased knowledge of precautions;Cardiopulmonary status limiting activity;Impaired sensation;Impaired UE functional use      OT Treatment/Interventions: Self-care/ADL training;DME and/or AE instruction;Neuromuscular education;Therapeutic activities;Cognitive remediation/compensation;Visual/perceptual remediation/compensation;Patient/family education;Balance training    OT Goals(Current goals can be found in the care plan section) Acute Rehab OT Goals Patient Stated Goal: pt did not state Time For Goal Achievement: 11/08/20 Potential to Achieve Goals: Good  OT Frequency: Min 3X/week   Barriers to D/C:            Co-evaluation              AM-PAC OT "6 Clicks" Daily Activity     Outcome Measure Help from another person eating meals?: Total Help from another person taking care of personal grooming?: A Lot Help from another person toileting, which includes using toliet, bedpan, or urinal?: Total Help from another person bathing (including washing, rinsing, drying)?: Total Help from another person to put on and taking off regular upper body clothing?: Total Help from another person to put on and taking off regular  lower body clothing?: Total 6 Click Score: 7   End of Session Nurse Communication: Mobility status;Other (comment) (cognition, lethargy)  Activity Tolerance: Patient limited by lethargy Patient left: in bed;with call bell/phone within  reach;with family/visitor present  OT Visit Diagnosis: Other abnormalities of gait and mobility (R26.89);Muscle weakness (generalized) (M62.81);Other symptoms and signs involving the nervous system (R29.898);Other symptoms and signs involving cognitive function;Cognitive communication deficit (R41.841) Symptoms and signs involving cognitive functions: Cerebral infarction                Time: 5726-2035 OT Time Calculation (min): 14 min Charges:  OT General Charges $OT Visit: 1 Visit OT Evaluation $OT Eval High Complexity: 1 High  Jolaine Artist, OT Acute Rehabilitation Services Pager [phone] Office [phone]   Brittany Griffith 10/25/2020, 11:34 AM

## 2020-10-25 NOTE — Progress Notes (Signed)
Physical Therapy Treatment Patient Details Name: Brittany Griffith MRN: [id number] DOB: 1951/08/02 Today's Date: 10/25/2020    History of Present Illness Patient is a 70 y.o. female with PMH: HLD, HTN, osteopenia who presented to hospital with complaints of speech difficulty and right sided weakness but on EMS arrival symptoms had completely resolved. In ER later in the day, MD reported patient started to have difficulty with getting her words out. 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 The next day (3/14) patient had abnormal neuro checks in AM and repeat MRI showed significant progression of acute infarct in the left MCA territory but negative for hemorrhagic transformation.    PT Comments    Pt progressing with mobility but continues to be very unsafe. Lethargic this AM but more alert in the PM. Able to come to EOB with visual cues. Immediate R lean noted in standing with close guarding needed due to decreased awareness and RLE apraxia. Pt impulsive with mobility and follows commands >50% of time, husband giving them in Nepali at times. Pt required mod A +2 to ambulate with RW 70' with R lean and running into objects R side with no self correction. Continue to highly recommend CIR for pt and husband may need more explaining of what CIR is and how it will benefit her. PT will continue to follow.    Follow Up Recommendations  CIR     Equipment Recommendations  3in1 (PT);Cane;Other (comment) (will discern over PT course)    Recommendations for Other Services Rehab consult     Precautions / Restrictions Precautions Precautions: Fall Restrictions Weight Bearing Restrictions: No    Mobility  Bed Mobility Overal bed mobility: Needs Assistance Bed Mobility: Supine to Sit;Sit to Supine     Supine to sit: Supervision Sit to supine: Min assist   General bed mobility comments: pt comes to EOB with visual and verbal cues, uses rail but did not require  physical assist. Min A to LE's for return to supine    Transfers Overall transfer level: Needs assistance Equipment used: 1 person hand held assist;Rolling walker (2 wheeled) Transfers: Sit to/from Stand Sit to Stand: Min guard         General transfer comment: pt stood with min guard +2, no physical assist but not safe with significant R lean  Ambulation/Gait Ambulation/Gait assistance: Mod assist;+2 safety/equipment Gait Distance (Feet): 70 Feet Assistive device: 1 person hand held assist;Rolling walker (2 wheeled) Gait Pattern/deviations: Staggering right Gait velocity: decr Gait velocity interpretation: >4.37 ft/sec, indicative of normal walking speed (but out of control) General Gait Details: pt with consistent R lean with R stagger and narrow BOS. Very unsafe first 5'. Given RW with assist to attend to holding R side and pt able to walk more safely but still required mod A +2 for safety. Running into object on R side with no ability to self correct. Fast, out of control pace, worked on Theatre stage manager.   Stairs             Wheelchair Mobility    Modified Rankin (Stroke Patients Only) Modified Rankin (Stroke Patients Only) Pre-Morbid Rankin Score: No symptoms Modified Rankin: Moderately severe disability     Balance Overall balance assessment: Needs assistance Sitting-balance support: No upper extremity supported;Feet supported Sitting balance-Leahy Scale: Fair Sitting balance - Comments: R lateral lean but no LOB in sitting Postural control: Right lateral lean Standing balance support: No upper extremity supported Standing balance-Leahy Scale: Poor Standing balance  comment: R lateral lean in standing with decreased proprioception RLE resulting in no self correction                            Cognition Arousal/Alertness: Lethargic;Suspect due to medications (family/RN report due to [place]) Behavior During Therapy: Flat affect Overall Cognitive  Status: Difficult to assess Area of Impairment: Attention;Problem solving;Awareness;Following commands                 Orientation Level: Disoriented to;Place;Person (did not question time or situation) Current Attention Level: Focused Memory: Decreased recall of precautions;Decreased short-term memory Following Commands: Follows one step commands inconsistently;Follows one step commands with increased time Safety/Judgement: Decreased awareness of deficits;Decreased awareness of safety Awareness: Intellectual Problem Solving: Slow processing;Decreased initiation;Difficulty sequencing;Requires verbal cues;Requires tactile cues General Comments: pt with minimal verbalization throughout session but following >50% of one step commands. Noted R inattention and poor problem solving. Pt also very impulsive with gait      Exercises      General Comments General comments (skin integrity, edema, etc.): husband present throughout. Pt followed some commands better in Nepali from husband but he also used visual cues.      Pertinent Vitals/Pain Pain Assessment: Faces Faces Pain Scale: No hurt    Home Living                      Prior Function            PT Goals (current goals can now be found in the care plan section) Acute Rehab PT Goals Patient Stated Goal: pt did not state PT Goal Formulation: With patient/family Time For Goal Achievement: 11/07/20 Potential to Achieve Goals: Good Progress towards PT goals: Progressing toward goals    Frequency    Min 4X/week      PT Plan Current plan remains appropriate    Co-evaluation PT/OT/SLP Co-Evaluation/Treatment: Yes Reason for Co-Treatment: Complexity of the patient's impairments (multi-system involvement);Necessary to address cognition/behavior during functional activity;For patient/therapist safety PT goals addressed during session: Mobility/safety with mobility;Balance;Proper use of DME OT goals addressed during  session: ADL's and self-care      AM-PAC PT "6 Clicks" Mobility   Outcome Measure  Help needed turning from your back to your side while in a flat bed without using bedrails?: None Help needed moving from lying on your back to sitting on the side of a flat bed without using bedrails?: None Help needed moving to and from a bed to a chair (including a wheelchair)?: A Little Help needed standing up from a chair using your arms (e.g., wheelchair or bedside chair)?: A Little Help needed to walk in hospital room?: A Lot Help needed climbing 3-5 steps with a railing? : Total 6 Click Score: 17    End of Session Equipment Utilized During Treatment: Gait belt Activity Tolerance: Patient tolerated treatment well Patient left: in bed;with call bell/phone within reach;with family/visitor present;with bed alarm set Nurse Communication: Mobility status PT Visit Diagnosis: Unsteadiness on feet (R26.81);Other abnormalities of gait and mobility (R26.89);Apraxia (R48.2)     Time: 6387-5643 PT Time Calculation (min) (ACUTE ONLY): 25 min  Charges:  $Gait Training: 8-22 mins                     [place]  Pager [phone] Office [place] 10/25/2020, 3:26 PM

## 2020-10-25 NOTE — Progress Notes (Signed)
STROKE TEAM PROGRESS NOTE   INTERVAL HISTORY Her  , son, and daughter-in-law were at the bedside.   She continues to have significant expressive aphasia and can barely speak a few words.  Her comprehension seems well preserved.  Neurologically stable exam.  No changes. Repeat CTA head shows the distal left middle cerebral artery stable high-grade stenosis with preserved distal with flow.  Will plan for elective left MCA revascularization tomorrow with Dr. Estanislado Pandy.. Vitals:   10/24/20 2348 10/25/20 0334 10/25/20 0833 10/25/20 1123  BP: (!) 150/63 (!) 143/55 (!) 167/63 (!) 175/67  Pulse: (!) 58 62 (!) 59 60  Resp: 18  16 17   Temp: 98.1 F (36.7 C) 97.6 F (36.4 C) 98.3 F (36.8 C) 97.8 F (36.6 C)  TempSrc: Oral Oral Oral Oral  SpO2: 98% 99% 100% 97%  Weight:      Height:       CBC:  Recent Labs  Lab 10/24/20 0448 10/25/20 0224  WBC 6.5 7.6  NEUTROABS 5.2  --   HGB 12.8 11.9*  HCT 38.1 33.4*  MCV 85.4 84.1  PLT 220 937   Basic Metabolic Panel:  Recent Labs  Lab 10/24/20 0448 10/25/20 0224  NA 137 140  K 3.7 3.5  CL 106 110  CO2 20* 23  GLUCOSE 143* 99  BUN 14 10  CREATININE 0.69 0.65  CALCIUM 9.2 8.8*  MG 1.8 1.8  PHOS  --  3.1   Lipid Panel:  Recent Labs  Lab 10/24/20 0448  CHOL 248*  TRIG 58  HDL 77  CHOLHDL 3.2  VLDL 12  LDLCALC 159*   HgbA1c:  Recent Labs  Lab 10/24/20 0448  HGBA1C 5.6   Urine Drug Screen:  Recent Labs  Lab 10/23/20 1430  LABOPIA NONE DETECTED  COCAINSCRNUR NONE DETECTED  LABBENZ NONE DETECTED  AMPHETMU NONE DETECTED  THCU NONE DETECTED  LABBARB NONE DETECTED     IMAGING past 24 hours  MRI BRAIN WO 10/24/2020 Acute infarct in the left MCA territory with significant progression since yesterday. This involves the insula and deep white matter with small areas of cortical infarct. 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 No associated brain edema.  CT HEAD WO CONTRAST  Result Date: 10/24/2020 1. Appearance suspicious for new Left PCA territory ischemia in the occipital pole. However, this might be streak artifact related to the complex left skull lesion.  2. Cytotoxic edema now evident at the posterior left insula and mildly larger than the DWI lesion yesterday.  3. Stable brain otherwise. No acute hemorrhage or increased intracranial mass effect. 4. Stable complex left frontal convexity meningioma(s) and skull hyperostosis.   MR BRAIN WO CONTRAST  Result Date: 10/24/2020 IMPRESSION: Acute infarct in the left MCA territory with significant progression since yesterday. This involves the insula and deep white matter with small areas of cortical infarct. 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 No associated brain edema.   MR Brain W and Wo Contrast  Result Date: 10/23/2020 IMPRESSION:  1. 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  2. 2.6 cm enhancing left frontal parafalcine mass consistent with a meningioma with mild edema and minimal rightward midline shift. This is associated with extensive  dural ossification and left frontal skull hyperostosis along the anterior and lateral cerebral convexities. 3. Smaller meningiomas along the falx in the posterior left frontal region and laterally in the left sylvian fissure.  4. 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  CTA HEAD/NECK IMPRESSION: 1. 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 Mild edema in the left frontal lobe. Trace local rightward midline shift. Brain MRI  without and with contrast is recommended for further evaluation. 2. No evidence of an acute infarct or intracranial hemorrhage. 3. No large vessel occlusion. 4. Intracranial atherosclerosis including moderate right and severe left M1 stenoses and moderate bilateral P2 stenoses. 5. Widely patent cervical carotid and vertebral arteries. 6. 2.3 cm right thyroid nodule. Recommend thyroid US (ref: J Am Coll  EEG adult Result Date: 10/23/2020 IMPRESSION: This study is suggestive of cortical dysfunction in left temporal region which is non specific but could be secondary to underlying structural abnormality. No seizures or epileptiform discharges were seen throughout the recording. [name]   CTA head Result date 10/25/2020 Evolving recent left MCA territory infarctions. No acute intracranial hemorrhage or significant mass effect.  Similar appearance of intracranial atherosclerosis compared to recent prior study.  PHYSICAL EXAM Physical Exam  HEENT-  Skull asymmetry due to hyperostosis. No trauma seen.    Lungs- Respirations unlabored Extremities- No edema  Neurological Examination  Mental Status:  Awake and alert with eyes open. examiner but unable to follow most verbal commands - will follow one step commands only. Speech output was sparse and mostly unintelligible, hypophonic but not dysarthric. Does not answer any questions but appears to be making an effort to answer and nodding appropriately   Cranial Nerves: II: Visual fields cannot be formally tested due asphasia. Will track a moving object to left and right. Will blink to threat. PERRL.   III,IV, VI: EOMI are intact horizontally with saccadic pursuits noted. With upgaze, left eye supraducts less than the right. No nystagmus.  V,VII: Unable to formally touch temperature sensation, but reacts to touch bilaterally. Right facial droop is prominent when smiling.  VIII: Hearing intact to some commands.  IX,X: Unable to assess.  Not following commands for mouth opening.  XI: Head is midline XII: Did not protrude tongue.  Motor: RUE 4/5 grip, biceps, triceps and deltoid. Drift resent LUE 4+/5 grip, biceps, triceps and deltoid. RLE 5/5 hip and knee flexion LLE 5/5 hip and knee flexion Sensory: Reacts to touch x 4 but does not answer questions regarding the intensity of the touch or temperature stimuli Cerebellar: ataxia on FN bilaterally Gait: Deferred due to falls risk concerns     ASSESSMENT/PLAN Ms. Ger Nicks is a 70 y.o. female with history of hypertension who was found sitting on the floor slumped over, nonverbal and unable to follow commands, but awake, tracking with her eyes and crying.  Husband then lifted his wife off the floor and noticed right-sided weakness and a right-sided facial droop. LKN was 0930. She continued to exhibit the same symptoms on EMS arrival to the home, with resolution approximately 20 minutes later.  MRI revealed small strokes scattered in the left MCA territory in the context of severe left M1 stenosis seen on CTA. Of note, she also has multiple left hemisphere meningiomas, the largest being associated with mild edema and minimal rightward shift.  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    CT head: left PCA  territory ischemia    CTA head & neck: left frontal parafalcine mass with extensive regional hyperostosis which may reflect an en plaque meningioma. Mild edema in the left frontal lobe. Trace local rightward midline shift. Intracranial atherosclerosis including moderate right and severe left M1 stenoses and moderate bilateral P2 stenoses  MRI brain w/o: acute infarct in the left MCA territory with significant progression since yesterday. This involves the insula and deep white matter with small areas of cortical infarct. Negative for hemorrhagic transformation  MRA: ubcentimeter acute left MCA infarcts in the left insula and       parietal lobe.  2. 2.6 cm  enhancing left frontal parafalcine mass           consistent with a meningioma with mild edema and minimal                 rightward midline shift. Smaller meningiomas along the falx in the       posterior left frontal region and laterally in the left sylvian fissure.  Repeat CTA head (3/15): Evolving recent left MCA territory infarctions. Stable appearance of left greater than right M1 stenoses. Distal MCA branch atherosclerotic irregularity is unchanged  2D Echo pending  LDL 159  HgbA1c 5.6  VTE prophylaxis - SCDs  Heart Healthy diet  No antithrombotic prior to admission, now on aspirin 81 mg daily and clopidogrel 75 mg daily.   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 No seizures or epileptiform discharges were seen throughout the recording.  Hypertension  Home meds:  none  Stable . Permissive hypertension (OK if < 220/120) but gradually normalize in 5-7 days . Long-term BP goal normotensive  Hyperlipidemia  Home meds:  none  LDL 159, goal < 70  Atorvastatin 80mg  daily  Continue statin at discharge  Other Stroke Risk Factors  Advanced Age >/= 29   Other Active Problems  2.3 cm right thyroid nodule. Recommend PCP followup and thyroid US  Left frontal meningioma likely asymptomatic and longstanding can be followed conservatively and no need for neurosurgical treatment at the present time.  Hospital day # 1 Tricky situation due to left MCA stenosis being near occlusive if we wait too long this may be completely  occluded and extend the stroke fully revascularized she is at risk for hemorrhagic transformation due to requiring high doses of antiplatelets.  Long discussion with patient,, daughter-in-law and her son and at the bedside and answered questions.  Family would like to discuss her situation with physician family members in [place] before they make a final decision.  Discussed with Dr. Estanislado Pandy if family is agreeable we will plan for elective left MCA angioplasty stenting tomorrow.  Greater than 50% time during this 35-minute visit with her stroke (treatment and answering questions. Antony Contras, MD   To contact Stroke Continuity provider, please refer to [email or web address] After hours, contact General Neurology

## 2020-10-25 NOTE — Progress Notes (Signed)
PROGRESS NOTE  Gearl Griffith MOQ:[id number] DOB: [birth year] DOA: 10/23/2020 PCP: [facility] [place]  HPI/Recap of past 24 hours: Brittany Griffith is a 70 y.o. female with Past medical history of hyperlipidemia and hypertension and osteopenia. Patient presented with complaints of speech difficulty and right-sided weakness. History was primarily obtained by husband.  Patient started having difficulty with right-sided weakness around 9:30 AM.  Husband found the patient slumped over in a chair.  Patient was initially nonverbal but tracking with the eyes.  Had significant weakness of her right side upper extremity and lower extremity.  Also had facial drooping on the right side.  On EMS arrival symptom completely resolved.  While initially the symptoms resolved in the ER, later on the day, patient started having difficulty with getting the words out.   CT head showed a meningioma.  Neurosurgery was consulted.  Patient was given a gram of Keppra and was recommended for observation given the severity of the mass.  Due to intermittent aphasic complaint neurology was consulted for further evaluation.  MRI brain showed acute stroke involving left MCA in the left insula and parietal lobe.  Patient was out of the window for TPA.  Due to abnormal neuro checks morning on 10/24/2020, MRI brain was repeated which showed significant progression of acute infarct in the left MCA territory.  Negative for hemorrhagic transformation.   10/25/20: Patient was seen and examined at her bedside this morning.  She is somnolent possibly secondary to AED Keppra but arousable to voices.  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   Presents with acute  onset of difficulty speaking, confusion as well as right-sided weakness. Found to have acute left MCA infarcts in the left insular and parietal lobe seen on MRI done on 10/23/2020. Admitted for stroke work-up. Neurology/stroke team consulted and following. Due to abnormal neuro checks this morning MRI brain was repeated which showed significant progression of acute infarct in the left MCA territory.   Negative for hemorrhagic transformation. Continue frequent neurochecks. Continue to closely monitor on neuro telemetry Fasting LDL 159 on 10/25/2018, goal less than 70. Hemoglobin A1c 5.6 on 10/24/2020, goal less than 7.0. Ongoing permissive hypertension. Previously on aspirin 325 mg daily, Plavix 75 mg daily IR Dr. Estanislado Pandy recommended to discontinue Plavix/aspirin 325 mg use and to load with Brilinta 180 mg x 1 dose tonight then begin Brilinta 90 mg twice daily and aspirin 81 mg daily tomorrow 10/26/2020. She was cleared by neurosurgery for antiplatelet therapy. Neurosurgery recommended outpatient follow-up. PT OT speech of recommending CIR. CIR has been consulted for possible placement  Meningioma Neurosurgery consulted Significant left hemispheric convexity with hyperostosis. Mild cerebral edema and brain compression. No frank herniation. No intervention recommended at this time by neurosurgery. She is currently on Keppra IV 500 mg twice daily for seizure prophylaxis. She was cleared by neurosurgery for antiplatelet therapy. Neurosurgery recommended outpatient follow-up.  Hyperlipidemia Fasting LDL 159 with goal LDL less than 70 Lipitor 80 mg daily.  Hypertensive urgency. Ongoing permissive hypertension. Continue to closely monitor vital signs.  Ambulatory dysfunction in the setting of stroke Continue PT OT with assistance and fall precautions PT OT have recommended CIR      DVT Prophylaxis: SCD, pharmacological prophylaxis contraindicated due to  Concern for  bleeding  Advance goals of care discussion: Full code   Consults: Neurosurgery and neurology/stroke team.  Family Communication:  Updated her husband and her son at bedside.  Disposition:  From: Home    Code Status: Full code.    Disposition Plan: Likely discharge to CIR if approved.   Procedures:  2D echo.  Antimicrobials:  None.   Status is: Observation    Dispo:  Patient From: Home  Planned Disposition: Inpatient Rehab  Anticipated discharge date: 10/26/2020.  Medically stable for discharge: No ongoing management of stroke.       Objective: Vitals:   10/24/20 2348 10/25/20 0334 10/25/20 0833 10/25/20 1123  BP: (!) 150/63 (!) 143/55 (!) 167/63 (!) 175/67  Pulse: (!) 58 62 (!) 59 60  Resp: 18  16 17   Temp: 98.1 F (36.7 C) 97.6 F (36.4 C) 98.3 F (36.8 C) 97.8 F (36.6 C)  TempSrc: Oral Oral Oral Oral  SpO2: 98% 99% 100% 97%  Weight:      Height:        Intake/Output Summary (Last 24 hours) at 10/25/2020 1603 Last data filed at 10/25/2020 1413 Gross per 24 hour  Intake -  Output 1731 ml  Net -1731 ml   Filed Weights   10/23/20 1126  Weight: 69.9 kg    Exam:  . General: 70 y.o. year-old female chronically ill-appearing no acute distress.  She is somnolent but easily arousable to voices.  \ . Cardiovascular: Regular rate and rhythm no rubs or gallops.   Marland Kitchen Respiratory: Clear to auscultation no wheeze or rales. . Abdomen: Soft nontender normal bowel sounds present. . Musculoskeletal: No lower extremity edema bilaterally. . Skin: No ulcerative lesions noted. Marland Kitchen Psychiatry: Mood is appropriate for condition and setting.   Data Reviewed: CBC: Recent Labs  Lab 10/23/20 1138 10/23/20 1206 10/24/20 0448 10/25/20 0224  WBC 6.8  --  6.5 7.6  NEUTROABS  --   --  5.2  --   HGB 13.4 12.9 12.8 11.9*  HCT 42.5 38.0 38.1 33.4*  MCV 90.8  --  85.4 84.1  PLT 213  --  220 599   Basic Metabolic Panel: Recent Labs  Lab 10/23/20 1138  10/23/20 1206 10/24/20 0448 10/25/20 0224  NA 139 141 137 140  K 3.6 3.5 3.7 3.5  CL 108 109 106 110  CO2 21*  --  20* 23  GLUCOSE 94 91 143* 99  BUN 11 12 14 10   CREATININE 0.67 0.70 0.69 0.65  CALCIUM 9.2  --  9.2 8.8*  MG  --   --  1.8 1.8  PHOS  --   --   --  3.1   GFR: Estimated Creatinine Clearance: 62.5 mL/min (by C-G formula based on SCr of 0.65 mg/dL). Liver Function Tests: Recent Labs  Lab 10/23/20 1138 10/24/20 0448  AST 22 18  ALT 17 15  ALKPHOS 54 53  BILITOT 1.0 0.6  PROT 6.9 6.6  ALBUMIN 3.8 3.5   No results for input(s): LIPASE, AMYLASE in the last 168 hours. No results for input(s): AMMONIA in the last 168 hours. Coagulation Profile: Recent Labs  Lab 10/25/20 0224  INR 1.1   Cardiac Enzymes: No results for input(s): CKTOTAL, CKMB, CKMBINDEX, TROPONINI in the last 168 hours. BNP (last 3 results) No results for input(s): PROBNP in the last 8760 hours. HbA1C: Recent Labs    10/24/20 0448  HGBA1C 5.6   CBG: Recent Labs  Lab 10/23/20 1126 10/25/20 1126  GLUCAP 98 91   Lipid Profile: Recent Labs    10/24/20 0448  CHOL 248*  HDL 77  LDLCALC 159*  TRIG 58  CHOLHDL 3.2   Thyroid Function Tests: No results for input(s): TSH, T4TOTAL, FREET4, T3FREE, THYROIDAB in the last 72 hours. Anemia Panel: No results for input(s): VITAMINB12, FOLATE, FERRITIN, TIBC, IRON, RETICCTPCT in the last 72 hours. Urine analysis:    Component Value Date/Time   COLORURINE STRAW (A) 10/23/2020 1430   APPEARANCEUR CLEAR 10/23/2020 1430   LABSPEC 1.027 10/23/2020 1430   PHURINE 6.0 10/23/2020 1430   GLUCOSEU NEGATIVE 10/23/2020 1430   HGBUR NEGATIVE 10/23/2020 1430   BILIRUBINUR NEGATIVE 10/23/2020 1430   KETONESUR NEGATIVE 10/23/2020 1430   PROTEINUR NEGATIVE 10/23/2020 1430   NITRITE NEGATIVE 10/23/2020 1430   LEUKOCYTESUR NEGATIVE 10/23/2020 1430   Sepsis Labs: @LABRCNTIP (procalcitonin:4,lacticidven:4)  ) Recent Results (from the past 240  hour(s))  Resp Panel by RT-PCR (Flu A&B, Covid) Nasopharyngeal Swab     Status: None   Collection Time: 10/23/20 12:28 PM   Specimen: Nasopharyngeal Swab; Nasopharyngeal(NP) swabs in vial transport medium  Result Value Ref Range Status   SARS Coronavirus 2 by RT PCR NEGATIVE NEGATIVE Final    Comment: (NOTE) SARS-CoV-2 target nucleic acids are NOT DETECTED.  The SARS-CoV-2 RNA is generally detectable in upper respiratory specimens during the acute phase of infection. The lowest concentration of SARS-CoV-2 viral copies this assay can detect is 138 copies/mL. A negative result does not preclude SARS-Cov-2 infection and should not be used as the sole basis for treatment or other patient management decisions. A negative result may occur with  improper specimen collection/handling, submission of specimen other than nasopharyngeal swab, presence of viral mutation(s) within the areas targeted by this assay, and inadequate number of viral copies(<138 copies/mL). A negative result must be combined with clinical observations, patient history, and epidemiological information. The expected result is Negative.  Fact Sheet for Patients:  EntrepreneurPulse.com.au  Fact Sheet for Healthcare Providers:  IncredibleEmployment.be  This test is no t yet approved or cleared by the [place] FDA and  has been authorized for detection and/or diagnosis of SARS-CoV-2 by FDA under an Emergency Use Authorization (EUA). This EUA will remain  in effect (meaning this test can be used) for the duration of the COVID-19 declaration under Section 564(b)(1) of the Act, 21 U.S.C.section 360bbb-3(b)(1), unless the authorization is terminated  or revoked sooner.       Influenza A by PCR NEGATIVE NEGATIVE Final   Influenza B by PCR NEGATIVE NEGATIVE Final    Comment: (NOTE) The Xpert Xpress SARS-CoV-2/FLU/RSV plus assay is intended as an aid in the diagnosis of influenza from  Nasopharyngeal swab specimens and should not be used as a sole basis for treatment. Nasal washings and aspirates are unacceptable for Xpert Xpress SARS-CoV-2/FLU/RSV testing.  Fact Sheet for Patients: EntrepreneurPulse.com.au  Fact Sheet for Healthcare Providers: IncredibleEmployment.be  This test is not yet approved or cleared by the [place] FDA and has been authorized for detection and/or diagnosis of SARS-CoV-2 by FDA under an Emergency Use Authorization (EUA). This EUA will remain in effect (meaning this test can be used) for the duration of the COVID-19 declaration under Section 564(b)(1) of the Act, 21 U.S.C. section 360bbb-3(b)(1), unless the authorization is terminated or revoked.  Performed at [facility], [place] [address] [place], [place] [address]       Studies: CT ANGIO HEAD CODE STROKE  Result Date: 10/25/2020 CLINICAL DATA:  Left MCA  stroke, follow-up EXAM: CT ANGIOGRAPHY HEAD TECHNIQUE: Multidetector CT imaging of the head was performed using the standard protocol during bolus administration of intravenous contrast. Multiplanar CT image reconstructions and MIPs were obtained to evaluate the vascular anatomy. CONTRAST:  67mL OMNIPAQUE IOHEXOL 350 MG/ML SOLN COMPARISON:  None. FINDINGS: CT HEAD Brain: There are increased foci of hypoattenuation in the left MCA territory corresponding to areas of acute infarction seen on prior MRI. There is no acute intracranial hemorrhage or significant mass effect. Additional patchy hypoattenuation in the supratentorial white matter likely reflects stable chronic microvascular ischemic changes. 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 Ventricles are stable in size. Stable appearance of calcified left parafalcine meningioma with extensive adjacent hyperostosis. Vascular: No new abnormality. Skull: Calvarium is unremarkable apart from hyperostosis. Sinuses/Orbits: No acute finding.  Other: None. CTA HEAD There is greater artifact on this study as compared to recent prior. Anterior circulation: Intracranial internal carotid arteries are patent. Mixed plaque results in moderate stenosis the paraclinoid portions. Anterior and middle cerebral arteries are patent. Stable appearance of left greater than right M1 stenoses. Additional more distal MCA branch atherosclerotic irregularity is unchanged. Anterior cerebral arteries are patent. Middle and anterior cerebral arteries are patent. Posterior circulation: Intracranial vertebral, basilar, and posterior cerebral arteries are patent. Atherosclerotic irregularity of the posterior cerebral arteries with bilateral P2 segment stenoses again identified. Venous sinuses: Patent as permitted by contrast timing. Review of the MIP images confirms the above findings IMPRESSION: Evolving recent left MCA territory infarctions. No acute intracranial hemorrhage or significant mass effect. Similar appearance of intracranial atherosclerosis compared to recent prior study. Electronically Signed   By: Macy Mis M.D.   On: 10/25/2020 08:47    Scheduled Meds: . [START ON 10/26/2020] aspirin EC  81 mg Oral Daily  . atorvastatin  80 mg Oral Daily  . Chlorhexidine Gluconate Cloth  6 each Topical Daily  . ticagrelor  180 mg Oral Once  . [START ON 10/26/2020] ticagrelor  90 mg Oral BID    Continuous Infusions: . sodium chloride 75 mL/hr at 10/25/20 0345  . levETIRAcetam 500 mg (10/25/20 0910)     LOS: 1 day     Kayleen Memos, MD [facility] Pager [phone]  If 7PM-7AM, please contact night-coverage [email or web address] Password [email or web address] 10/25/2020, 4:03 PM

## 2020-10-25 NOTE — Consult Note (Signed)
Physical Medicine and Rehabilitation Consult Reason for Consult: Right side weakness and slurred speech Referring Physician: [facility]   HPI: Brittany Griffith is a 70 y.o. right-handed limited English speaking female with history of hyperlipidemia as well as hypertension.  Per chart review patient lives with spouse.  Independent prior to admission.  1 level home one-step to entry.  Presented 10/23/2020 with right side weakness, headache and slurred speech.  Noted blood pressure 200/77.  CT angiogram of head and neck showed left frontal parafalcine mass with extensive regional hyperostosis possibly reflecting en plaque meningioma.  Mild edema in the left frontal lobe.  Trace local rightward midline shift.  No large vessel occlusion.  MRI showed subcentimeter acute left MCA infarct in the left insula and parietal lobe.  2.6 cm enhancing left frontal parafalcine mass consistent with meningioma minimal rightward midline shift.  Neurosurgery Dr. Venetia Constable did follow-up in regards to meningioma felt to be slow-growing and would follow-up outpatient after initial evaluation by neurology services for CVA.  Current plan is for cerebral arteriogram 10/25/2020 with results currently pending.  Maintained on aspirin and Plavix for CVA.  EEG negative for seizure.  Keppra added for seizure prophylaxis.  Echocardiogram with ejection fraction of 60 to 65% no wall motion abnormalities.  Therapy evaluations completed with recommendations physical medicine rehab consult due to right side weakness and speech difficulty. Discussed CIR with son and daughter at bedside.   Review of Systems  Unable to perform ROS: Language   Past Medical History:  Diagnosis Date  . Hypercholesteremia   . Hypertension   . Osteopenia    History reviewed. No pertinent surgical history. Family History  Problem Relation Age of Onset  . Stroke Father   . Heart attack Father   . Kidney disease Brother    Social History:  reports that she  has never smoked. She has never used smokeless tobacco. No history on file for alcohol use and drug use. Allergies:  Allergies  Allergen Reactions  . Penicillins     unk reaction   Medications Prior to Admission  Medication Sig Dispense Refill  . Multiple Vitamins-Minerals (MULTIVITAMIN WITH MINERALS) tablet Take 1 tablet by mouth daily.      Home: Home Living Family/patient expects to be discharged to:: Private residence Living Arrangements: Spouse/significant other,Children Available Help at Discharge: Family Type of Home: House Home Access: Stairs to enter Technical brewer of Steps: 1 Entrance Stairs-Rails: None Home Layout: One level Bathroom Shower/Tub: Financial planner: Yes Home Equipment: None  Lives With: Spouse  Functional History:   Functional Status:  Mobility: Bed Mobility Overal bed mobility: Needs Assistance Bed Mobility: Supine to Sit,Sit to Supine Supine to sit: Min guard Sit to supine: Min guard General bed mobility comments: [place]inguard for safety; Did not attend to lines and leads while moving out of and back into bed; Sat up to long sit in the bed initially; multimodal cues to bring LEs around to EOB; Pt got back to bed at end of session, facing bed, with hands on the bed, then bringing L knee up onto the bed and moving through quadruped before making her way to supine Transfers Overall transfer level: Needs assistance Equipment used: 1 person hand held assist Transfers: Sit to/from Stand Sit to Stand: Min assist General transfer comment: Overall adequate LE strength to rise; needing handheld assist to steady; multimodal cueing to slow down to allow for line management Ambulation/Gait Ambulation/Gait assistance: Min guard,Mod assist,+2 safety/equipment Gait Distance (Feet):  60 Feet (hallway amb) Assistive device: 1 person hand held assist,2 person hand held assist Gait Pattern/deviations: Decreased step length -  right,Decreased step length - left,Drifts right/left General Gait Details: Handheld assist to steady; notable gait assymetries including no arm swing RUE, decr foot clearance RLE; 3 significant losses of balance to the right, needing mod assist to prevent fall Gait velocity: decr    ADL:    Cognition: Cognition Overall Cognitive Status: Impaired/Different from baseline Arousal/Alertness: Awake/alert Orientation Level: Oriented to person,Oriented to place,Oriented to situation Attention: Sustained Sustained Attention: Appears intact Memory:  (unable to assess secondary to aphasia) Awareness: Impaired Awareness Impairment: Intellectual impairment Problem Solving: Impaired Problem Solving Impairment: Functional basic Executive Function: Sequencing Sequencing: Impaired Sequencing Impairment: Functional basic Cognition Arousal/Alertness: Awake/alert Behavior During Therapy: WFL for tasks assessed/performed,Impulsive Overall Cognitive Status: Impaired/Different from baseline Area of Impairment: Problem solving,Awareness,Safety/judgement General Comments: Pt's husband reports she has been getting up OOB impulsively many times throughout the day  Blood pressure (!) 143/55, pulse 62, temperature 97.6 F (36.4 C), temperature source Oral, resp. rate 18, height 5\' 2"  (1.575 m), weight 69.9 kg, SpO2 99 %. Physical Exam Gen: no distress, normal appearing HEENT: oral mucosa pink and moist, NCAT Cardio: Reg rate Chest: normal effort, normal rate of breathing Abd: soft, non-distended Ext: no edema Psych: pleasant, lethargic Skin: intact Neurological:     Comments: Patient is awake and alert.  She did not follow verbal commands.  There was a language barrier.  Speech output was minimal. 4/5 strength RUE, 4+5 LUE, strength intact in LE   Results for orders placed or performed during the hospital encounter of 10/23/20 (from the past 24 hour(s))  Protime-INR     Status: None   Collection  Time: 10/25/20  2:24 AM  Result Value Ref Range   Prothrombin Time 13.5 11.4 - 15.2 seconds   INR 1.1 0.8 - 1.2  CBC     Status: Abnormal   Collection Time: 10/25/20  2:24 AM  Result Value Ref Range   WBC 7.6 4.0 - 10.5 K/uL   RBC 3.97 3.87 - 5.11 MIL/uL   Hemoglobin 11.9 (L) 12.0 - 15.0 g/dL   HCT 33.4 (L) 36.0 - 46.0 %   MCV 84.1 80.0 - 100.0 fL   MCH 30.0 26.0 - 34.0 pg   MCHC 35.6 30.0 - 36.0 g/dL   RDW 12.5 11.5 - 15.5 %   Platelets 202 150 - 400 K/uL   nRBC 0.0 0.0 - 0.2 %  Basic metabolic panel     Status: Abnormal   Collection Time: 10/25/20  2:24 AM  Result Value Ref Range   Sodium 140 135 - 145 mmol/L   Potassium 3.5 3.5 - 5.1 mmol/L   Chloride 110 98 - 111 mmol/L   CO2 23 22 - 32 mmol/L   Glucose, Bld 99 70 - 99 mg/dL   BUN 10 8 - 23 mg/dL   Creatinine, Ser 0.65 0.44 - 1.00 mg/dL   Calcium 8.8 (L) 8.9 - 10.3 mg/dL   GFR, Estimated >60 >60 mL/min   Anion gap 7 5 - 15  Magnesium     Status: None   Collection Time: 10/25/20  2:24 AM  Result Value Ref Range   Magnesium 1.8 1.7 - 2.4 mg/dL  Phosphorus     Status: None   Collection Time: 10/25/20  2:24 AM  Result Value Ref Range   Phosphorus 3.1 2.5 - 4.6 mg/dL   CT Angio Head W/Cm &/Or Wo Cm  Result Date: 10/23/2020 CLINICAL DATA:  Altered mental status. EXAM: CT ANGIOGRAPHY HEAD AND NECK TECHNIQUE: Multidetector CT imaging of the head and neck was performed using the standard protocol during bolus administration of intravenous contrast. Multiplanar CT image reconstructions and MIPs were obtained to evaluate the vascular anatomy. Carotid stenosis measurements (when applicable) are obtained utilizing NASCET criteria, using the distal internal carotid diameter as the denominator. CONTRAST:  64mL OMNIPAQUE IOHEXOL 350 MG/ML SOLN COMPARISON:  None. FINDINGS: CT HEAD FINDINGS Brain: There is a 2.2 x 2.2 x 2.4 cm partially calcified extra-axial mass anteriorly along the falx in the left frontal region with evidence of very  mild edema in the left frontal lobe. This mass is associated with extensive dural ossification/hyperostosis anteriorly and laterally over the left frontal convexity with mass effect on the underlying left frontal lobe. Separate foci of dural calcification/ossification are noted more superiorly along the falx. There is also an 8 mm focus of fat along the inner aspect of the anterior left frontal hyperostosis. There is trace rightward midline shift at the level of the mass. There is mild mass effect on the left lateral ventricle. There is no evidence of hydrocephalus, an acute infarct, intracranial hemorrhage, or extra-axial fluid collection. A small right cerebellar infarct is favored to be chronic. Vascular: Calcified atherosclerosis at the skull base. Skull: Extensive left frontal hyperostosis. Sinuses: Partially visualized mucosal thickening and likely fluid in the maxillary sinuses. Clear mastoid air cells. Orbits: Unremarkable. 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 Wide patency of both subclavian arteries with mild nonstenotic plaque in the proximal left subclavian artery. Right carotid system: Patent with mild atheromatous wall thickening in the mid and distal common carotid artery. No evidence of a dissection or significant stenosis. Left carotid system: Patent with mild atheromatous wall thickening in the mid and distal common carotid artery and mild calcified plaque at the carotid bifurcation. No evidence of dissection or significant stenosis. Vertebral arteries: Patent without evidence of stenosis or dissection. Strongly dominant left vertebral artery. Skeleton: Mild disc and moderate facet degeneration in the cervical spine. Other neck: Bilateral thyroid nodules including a 2.3 cm nodule on the right. No evidence of cervical  lymphadenopathy Upper chest: Left greater than right apical lung scarring. Review of the MIP images confirms the above findings CTA HEAD FINDINGS Anterior circulation: The internal carotid arteries are patent from skull base to carotid termini with mild atherosclerotic plaque bilaterally not resulting in a significant stenosis. ACAs and MCAs are patent without evidence of a proximal branch occlusion. There are moderate proximal right M1 and severe distal left M1 stenoses. Mild branch vessel irregularity is present bilaterally. No aneurysm is identified. Posterior circulation: The intracranial vertebral arteries are widely patent to the basilar with the right being diminutive distal to the PICA origin. Patent PICA, AICA, and SCA origins are identified bilaterally. The basilar artery is widely patent. Both PCAs are patent with moderate left greater than right P2 segment stenoses. No aneurysm is identified. Venous sinuses: Narrowed but patent superior sagittal sinus by dural ossification. Anatomic variants: Hypoplastic right vertebral artery. Review of the MIP images confirms the above findings IMPRESSION: 1. 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 Mild edema in the left frontal lobe. Trace local rightward midline shift. Brain MRI without and with contrast is recommended for further  evaluation. 2. No evidence of an acute infarct or intracranial hemorrhage. 3. No large vessel occlusion. 4. Intracranial atherosclerosis including moderate right and severe left M1 stenoses and moderate bilateral P2 stenoses. 5. Widely patent cervical carotid and vertebral arteries. 6. 2.3 cm right thyroid nodule. Recommend thyroid US (ref: J Am Coll Radiol. 2015 Feb;12(2): 143-50). Electronically Signed   By: Logan Bores M.D.   On: 10/23/2020 14:06   CT HEAD WO CONTRAST  Result Date: 10/24/2020 CLINICAL DATA:  70 year old female with worsening NIH stroke scale.  Punctate left MCA territory infarcts on MRI yesterday. EXAM: CT HEAD WITHOUT CONTRAST TECHNIQUE: Contiguous axial images were obtained from the base of the skull through the vertex without intravenous contrast. COMPARISON:  Brain MRI, CTA head and neck yesterday. FINDINGS: Brain: Suspicious appearance now of the left occipital pole suggesting new cytotoxic edema there (series 4, image 12). Streak artifact from the complex skull lesion is felt less likely. Cytotoxic edema is identified in the posterior insula and appears larger than the DWI lesion there yesterday. But other gray-white matter differentiation in the left MCA territory appears stable. No acute intracranial hemorrhage identified. No ventriculomegaly. Chronic small cerebellar infarcts appear stable. Complex left frontal convexity meningioma(s) and associated mass effect appear stable. Possible mild anterior left frontal lobe vasogenic edema is stable. Vascular: Calcified atherosclerosis at the skull base. No suspicious intracranial vascular hyperdensity. 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 No acute osseous abnormality identified. Sinuses/Orbits: Stable. Small maxillary sinus fluid levels and mucoperiosteal thickening. Other: Stable orbit and scalp soft tissues. IMPRESSION: 1. Appearance suspicious for new Left PCA territory ischemia in the occipital pole. However, this might be streak artifact related to the complex left skull lesion. 2. Cytotoxic edema now evident at the posterior left insula and mildly larger than the DWI lesion yesterday. 3. Stable brain otherwise. No acute hemorrhage or increased intracranial mass effect. 4. Stable complex left frontal convexity meningioma(s) and skull hyperostosis. Electronically Signed   By: Genevie Ann M.D.   On: 10/24/2020 06:13   CT Angio Neck W and/or Wo Contrast  Result Date: 10/23/2020 CLINICAL DATA:  Altered mental status. EXAM: CT ANGIOGRAPHY HEAD AND NECK  TECHNIQUE: Multidetector CT imaging of the head and neck was performed using the standard protocol during bolus administration of intravenous contrast. Multiplanar CT image reconstructions and MIPs were obtained to evaluate the vascular anatomy. Carotid stenosis measurements (when applicable) are obtained utilizing NASCET criteria, using the distal internal carotid diameter as the denominator. CONTRAST:  60mL OMNIPAQUE IOHEXOL 350 MG/ML SOLN COMPARISON:  None. FINDINGS: CT HEAD FINDINGS Brain: There is a 2.2 x 2.2 x 2.4 cm partially calcified extra-axial mass anteriorly along the falx in the left frontal region with evidence of very mild edema in the left frontal lobe. This mass is associated with extensive dural ossification/hyperostosis anteriorly and laterally over the left frontal convexity with mass effect on the underlying left frontal lobe. Separate foci of dural calcification/ossification are noted more superiorly along the falx. There is also an 8 mm focus of fat along the inner aspect of the anterior left frontal hyperostosis. There is trace rightward midline shift at the level of the mass. There is mild mass effect on the left lateral ventricle. There is no evidence of hydrocephalus, an acute infarct, intracranial hemorrhage, or extra-axial fluid collection. A small right cerebellar infarct is favored to be chronic. Vascular: Calcified atherosclerosis at the skull base. Skull: Extensive left frontal hyperostosis. Sinuses: Partially visualized mucosal thickening and  likely fluid in the maxillary sinuses. Clear mastoid air cells. Orbits: Unremarkable. 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 Wide patency of both subclavian arteries with mild nonstenotic plaque in the proximal left subclavian artery. Right carotid system: Patent with mild  atheromatous wall thickening in the mid and distal common carotid artery. No evidence of a dissection or significant stenosis. Left carotid system: Patent with mild atheromatous wall thickening in the mid and distal common carotid artery and mild calcified plaque at the carotid bifurcation. No evidence of dissection or significant stenosis. Vertebral arteries: Patent without evidence of stenosis or dissection. Strongly dominant left vertebral artery. Skeleton: Mild disc and moderate facet degeneration in the cervical spine. Other neck: Bilateral thyroid nodules including a 2.3 cm nodule on the right. No evidence of cervical lymphadenopathy Upper chest: Left greater than right apical lung scarring. Review of the MIP images confirms the above findings CTA HEAD FINDINGS Anterior circulation: The internal carotid arteries are patent from skull base to carotid termini with mild atherosclerotic plaque bilaterally not resulting in a significant stenosis. ACAs and MCAs are patent without evidence of a proximal branch occlusion. There are moderate proximal right M1 and severe distal left M1 stenoses. Mild branch vessel irregularity is present bilaterally. No aneurysm is identified. Posterior circulation: The intracranial vertebral arteries are widely patent to the basilar with the right being diminutive distal to the PICA origin. Patent PICA, AICA, and SCA origins are identified bilaterally. The basilar artery is widely patent. Both PCAs are patent with moderate left greater than right P2 segment stenoses. No aneurysm is identified. Venous sinuses: Narrowed but patent superior sagittal sinus by dural ossification. Anatomic variants: Hypoplastic right vertebral artery. Review of the MIP images confirms the above findings IMPRESSION: 1. 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 Mild edema in the left frontal lobe. Trace local rightward midline shift. Brain MRI  without and with contrast is recommended for further evaluation. 2. No evidence of an acute infarct or intracranial hemorrhage. 3. No large vessel occlusion. 4. Intracranial atherosclerosis including moderate right and severe left M1 stenoses and moderate bilateral P2 stenoses. 5. Widely patent cervical carotid and vertebral arteries. 6. 2.3 cm right thyroid nodule. Recommend thyroid US (ref: J Am Coll Radiol. 2015 Feb;12(2): 143-50). Electronically Signed   By: Logan Bores M.D.   On: 10/23/2020 14:06   MR BRAIN WO CONTRAST  Result Date: 10/24/2020 CLINICAL DATA:  Stroke. Neuro change, with right facial droop and aphasia. EXAM: MRI HEAD WITHOUT CONTRAST TECHNIQUE: Multiplanar, multiecho pulse sequences of the brain and surrounding structures were obtained without intravenous contrast. COMPARISON:  MRI head 10/23/2020.  CT head 10/24/2020 FINDINGS: Brain: Restricted diffusion compatible with acute infarct on the left has progressed. This involves the posterior insula as well as the deep white matter in the left posterior frontal lobe. Small areas of cortical infarct also new. No infarct in the left occipital lobe as questioned on CT. Small chronic infarct in the right cerebellum. No intracranial hemorrhage. Left hemispheric extra-axial mass is partially ossified and also has areas of fatty transformation. This is predominantly in the left frontal lobe but extends to the posterior parietal lobe. There is extensive hyperostosis of the left frontal bone. Mild mass-effect on the left frontal lobe. No midline shift. Ventricle size normal. Vascular: Normal arterial flow voids.  Skull and upper cervical spine: Extensive hyperostosis left frontal bone consistent with meningioma. Sinuses/Orbits: Mucosal edema and air-fluid levels in the sphenoid sinus bilaterally. Normal orbit. Other: None IMPRESSION: Acute infarct in the left MCA territory with significant progression since yesterday. This involves the insula and deep  white matter with small areas of cortical infarct. 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 No associated brain edema. Electronically Signed   By: Franchot Gallo M.D.   On: 10/24/2020 09:35   MR Brain W and Wo Contrast  Result Date: 10/23/2020 CLINICAL DATA:  TIA. Altered mental status. Left frontal parafalcine mass on CT. EXAM: MRI HEAD WITHOUT AND WITH CONTRAST TECHNIQUE: Multiplanar, multiecho pulse sequences of the brain and surrounding structures were obtained without and with intravenous contrast. CONTRAST:  23mL GADAVIST GADOBUTROL 1 MMOL/ML IV SOLN COMPARISON:  Head and neck CTA 10/23/2020 FINDINGS: Brain: There are subcentimeter acute infarcts in the posterior left insula and left parietal subcortical white matter (MCA territory). There is are small chronic bilateral cerebellar infarcts. There is no intracranial hemorrhage, hydrocephalus, or extra-axial fluid collection. Scattered small foci of T2 hyperintensity in the cerebral white matter bilaterally are nonspecific but compatible with mild chronic small vessel ischemic disease. An avidly enhancing extra-axial mass along the left anterior falx in the frontal region measures 2.6 x 2.2 x 2.5 cm (AP x transverse x craniocaudal) with mild edema in the adjacent left frontal white matter and minimal localized rightward midline shift. 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 This extends inferiorly and laterally into the anterior and middle cranial fossae, and there is a 1.1 cm enhancing component which projects into the left sylvian fissure. A separate enhancing calcified mass along the left aspect of the falx in the posterior frontal region measures 1.5 cm. There is mild  diffuse dural thickening along the upper portion of the falx. Vascular: Major intracranial vascular flow voids are preserved. Skull and upper cervical spine: Extensive left frontal hyperostosis. Sinuses/Orbits: Unremarkable orbits. Paranasal sinuses and mastoid air cells are clear. Other: None. IMPRESSION: 1. 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 2. 2.6 cm enhancing left frontal parafalcine mass consistent with a meningioma with mild edema and minimal rightward midline shift. This is associated with extensive dural ossification and left frontal skull hyperostosis along the anterior and lateral cerebral convexities. 3. Smaller meningiomas along the falx in the posterior left frontal region and laterally in the left sylvian fissure. 4. 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 Electronically Signed   By: Logan Bores M.D.   On: 10/23/2020 16:08   EEG adult  Result Date: 10/23/2020 [name], MD     10/23/2020  7:38 PM Patient Name: Brittany Griffith MRN: [id number] Epilepsy Attending: [name] Referring Physician/Provider: DR Lesleigh Noe Date: 10/23/2020 Duration: 30.35 mins Patient history: 70 y.o. woman w/ 1h of aphasia and R sided weakness. EEG to evaluate for seizure Level of alertness: Awake, asleep AEDs during EEG study: Technical aspects: This EEG study was done with scalp electrodes positioned according to the 10-20 International system of electrode placement. Electrical activity was acquired at a sampling rate of 500Hz  and reviewed with a high frequency filter of 70Hz  and a low frequency filter of 1Hz . EEG data  were recorded continuously and digitally stored. Description: The posterior dominant rhythm consists of 9-10 Hz activity of moderate voltage (25-35 uV) seen predominantly in posterior head regions, symmetric and reactive to eye opening and eye closing. Sleep was characterized by vertex waves, sleep spindles  (12 to 14 Hz), maximal frontocentral region. EEG showed intermitten tleft temporal 2-3hz  delta slowing.  Hyperventilation and photic stimulation were not performed.   ABNORMALITY -Intermittent slow, left temporal IMPRESSION: This study is suggestive of cortical dysfunction in left temporal region which is non specific but could be secondary to underlying structural abnormality. No seizures or epileptiform discharges were seen throughout the recording. [name]   ECHOCARDIOGRAM COMPLETE  Result Date: 10/24/2020    ECHOCARDIOGRAM REPORT   Patient Name:   Brittany Griffith Date of Exam: 10/24/2020 Medical Rec #:  [id number]     Height:       62.0 in Accession #:    [id number]    Weight:       154.0 lb Date of Birth:  Aug 14, 1950    BSA:          77.711 m Patient Age:    89 years      BP:           171/60 mmHg Patient Gender: F             HR:           62 bpm. Exam Location:  Inpatient Procedure: 2D Echo, Cardiac Doppler, Color Doppler and Intracardiac            Opacification Agent Indications:    Stroke  History:        Patient has no prior history of Echocardiogram examinations.                 Risk Factors:Hypertension and Dyslipidemia.  Sonographer:    Clayton Lefort RDCS (AE) Referring Phys: [id number] [place]  1. Left ventricular ejection fraction, by estimation, is 60 to 65%. The left ventricle has normal function. The left ventricle has no regional wall motion abnormalities. Left ventricular diastolic parameters are indeterminate.  2. Right ventricular systolic function is normal. The right ventricular size is normal. There is normal pulmonary artery systolic pressure.  3. Right atrial size was mildly dilated.  4. The mitral valve is normal in structure. Trivial mitral valve regurgitation.  5. The aortic valve is normal in structure. Aortic valve regurgitation is mild. No aortic stenosis is present. FINDINGS  Left Ventricle: Left ventricular ejection fraction, by estimation, is 60 to 65%. The  left ventricle has normal function. The left ventricle has no regional wall motion abnormalities. Definity contrast agent was given IV to delineate the left ventricular  endocardial borders. The left ventricular internal cavity size was normal in size. There is borderline left ventricular hypertrophy. Left ventricular diastolic parameters are indeterminate. Right Ventricle: The right ventricular size is normal. No increase in right ventricular wall thickness. Right ventricular systolic function is normal. There is normal pulmonary artery systolic pressure. The tricuspid regurgitant velocity is 2.29 m/s, and  with an assumed right atrial pressure of 3 mmHg, the estimated right ventricular systolic pressure is 32.9 mmHg. Left Atrium: Left atrial size was normal in size. Right Atrium: Right atrial size was mildly dilated. Pericardium: There is no evidence of pericardial effusion. Mitral Valve: The mitral valve is normal in structure. Trivial mitral valve regurgitation. MV peak gradient, 2.9 mmHg. The mean mitral valve gradient is 1.0 mmHg. Tricuspid Valve: The tricuspid  valve is grossly normal. Tricuspid valve regurgitation is trivial. Aortic Valve: The aortic valve is normal in structure. Aortic valve regurgitation is mild. No aortic stenosis is present. Aortic valve mean gradient measures 3.0 mmHg. Aortic valve peak gradient measures 5.1 mmHg. Aortic valve area, by VTI measures 1.94 cm. Pulmonic Valve: The pulmonic valve was normal in structure. Pulmonic valve regurgitation is not visualized. Aorta: The aortic root and ascending aorta are structurally normal, with no evidence of dilitation. IAS/Shunts: The atrial septum is grossly normal.  LEFT VENTRICLE PLAX 2D LVIDd:         4.10 cm  Diastology LVIDs:         2.90 cm  LV e' medial:    5.87 cm/s LV PW:         1.30 cm  LV E/e' medial:  11.0 LV IVS:        1.10 cm  LV e' lateral:   6.85 cm/s LVOT diam:     1.70 cm  LV E/e' lateral: 9.4 LV SV:         55 LV SV Index:    32 LVOT Area:     2.27 cm  RIGHT VENTRICLE             IVC RV Basal diam:  2.80 cm     IVC diam: 1.00 cm RV S prime:     10.60 cm/s TAPSE (M-mode): 2.7 cm LEFT ATRIUM             Index       RIGHT ATRIUM           Index LA diam:        2.80 cm 1.64 cm/m  RA Area:     19.20 cm LA Vol (A2C):   25.7 ml 15.02 ml/m RA Volume:   56.90 ml  33.26 ml/m LA Vol (A4C):   43.4 ml 25.37 ml/m LA Biplane Vol: 33.8 ml 19.76 ml/m  AORTIC VALVE AV Area (Vmax):    2.09 cm AV Area (Vmean):   1.88 cm AV Area (VTI):     1.94 cm AV Vmax:           113.00 cm/s AV Vmean:          81.600 cm/s AV VTI:            0.282 m AV Peak Grad:      5.1 mmHg AV Mean Grad:      3.0 mmHg LVOT Vmax:         104.00 cm/s LVOT Vmean:        67.600 cm/s LVOT VTI:          0.241 m LVOT/AV VTI ratio: 0.85  AORTA Ao Root diam: 3.30 cm Ao Asc diam:  3.00 cm MITRAL VALVE               TRICUSPID VALVE MV Area (PHT): 2.87 cm    TR Peak grad:   21.0 mmHg MV Area VTI:   1.63 cm    TR Vmax:        229.00 cm/s MV Peak grad:  2.9 mmHg MV Mean grad:  1.0 mmHg    SHUNTS MV Vmax:       0.85 m/s    Systemic VTI:  0.24 m MV Vmean:      41.9 cm/s   Systemic Diam: 1.70 cm MV Decel Time: 264 msec MV E velocity: 64.70 cm/s MV A velocity: 82.30 cm/s MV E/A ratio:  0.79 Arnette Norris  Nahser MD Electronically signed by Mertie Moores MD Signature Date/Time: 10/24/2020/3:36:07 PM    Final      Assessment/Plan: Diagnosis: Left MCA infarct 1. Does the need for close, 24 hr/day medical supervision in concert with the patient's rehab needs make it unreasonable for this patient to be served in a less intensive setting? Yes 2. Co-Morbidities requiring supervision/potential complications:  1. Meningioma 2. R>left upper extremity weakness: will benefit from intensive PT and OT 3. Lethargy 4. Cerebral edema 5. Expressive aphasia: will benefit from intensive SLP 6. Hypertensive urgency: will require TID monitoring of BP 3. Due to bladder management, bowel management, safety,  skin/wound care, disease management, medication administration, pain management and patient education, does the patient require 24 hr/day rehab nursing? Yes 4. Does the patient require coordinated care of a physician, rehab nurse, therapy disciplines of PT, OT, SLP to address physical and functional deficits in the context of the above medical diagnosis(es)? Yes Addressing deficits in the following areas: balance, endurance, locomotion, strength, transferring, bowel/bladder control, bathing, dressing, feeding, grooming, toileting, cognition, language and psychosocial support 5. Can the patient actively participate in an intensive therapy program of at least 3 hrs of therapy per day at least 5 days per week? Yes 6. The potential for patient to make measurable gains while on inpatient rehab is excellent 7. Anticipated functional outcomes upon discharge from inpatient rehab are supervision  with PT, supervision with OT, supervision with SLP. 8. Estimated rehab length of stay to reach the above functional goals is: 7-9 days 9. Anticipated discharge destination: Home 10. Overall Rehab/Functional Prognosis: excellent  RECOMMENDATIONS: This patient's condition is appropriate for continued rehabilitative care in the following setting: CIR Patient has agreed to participate in recommended program. Yes Note that insurance prior authorization may be required for reimbursement for recommended care.  Comment: Thank you for this consult. Admission coordinator to follow.   I have personally performed a face to face diagnostic evaluation, including, but not limited to relevant history and physical exam findings, of this patient and developed relevant assessment and plan.  Additionally, I have reviewed and concur with the physician assistant's documentation above.  Leeroy Cha, MD  Lavon Paganini [place], PA-C 10/25/2020

## 2020-10-25 NOTE — Progress Notes (Signed)
Inpatient Rehab Admissions Coordinator:   Met with patient and her spouse at the bedside to discuss CIR goals/expectations.  Explained ELOS of about 7-9 days, with goals of supervision, and that recommendation would be for someone to be with patient 24/7.  Also explained that insurance authorization had to be obtained for CIR admission, and pt's spouse asked me to wait until he could speak with his daughter in law.  Did not appear to fully understand what I was trying to explain, but did expressly ask not to proceed with insurance auth just yet.  Note that pt was already min assist for 60' of ambulation, but maybe not as mobile today.  Caitlin Warren, PT, DPT Admissions Coordinator [phone] 10/25/20  3:11 PM   

## 2020-10-26 ENCOUNTER — Inpatient Hospital Stay (HOSPITAL_COMMUNITY): Payer: Medicare HMO

## 2020-10-26 ENCOUNTER — Inpatient Hospital Stay (HOSPITAL_COMMUNITY): Payer: Medicare HMO | Admitting: Certified Registered"

## 2020-10-26 ENCOUNTER — Encounter (HOSPITAL_COMMUNITY): Payer: Self-pay | Admitting: Internal Medicine

## 2020-10-26 ENCOUNTER — Encounter (HOSPITAL_COMMUNITY)
Admission: EM | Disposition: A | Discharge status: Rehabilitation Facility | Payer: Self-pay | Source: Home / Self Care | Attending: Neurology

## 2020-10-26 ENCOUNTER — Other Ambulatory Visit (HOSPITAL_COMMUNITY): Payer: Self-pay | Admitting: Radiology

## 2020-10-26 DIAGNOSIS — I6602 Occlusion and stenosis of left middle cerebral artery: Secondary | ICD-10-CM | POA: Diagnosis present

## 2020-10-26 DIAGNOSIS — R4701 Aphasia: Secondary | ICD-10-CM

## 2020-10-26 HISTORY — PX: IR INTRA CRAN STENT: IMG2345

## 2020-10-26 HISTORY — PX: RADIOLOGY WITH ANESTHESIA: SHX6223

## 2020-10-26 HISTORY — PX: IR ANGIO INTRA EXTRACRAN SEL COM CAROTID INNOMINATE UNI R MOD SED: IMG5359

## 2020-10-26 LAB — POCT ACTIVATED CLOTTING TIME
Activated Clotting Time: 249 seconds
Activated Clotting Time: 261 seconds

## 2020-10-26 LAB — CBC
HCT: 35.8 % — ABNORMAL LOW (ref 36.0–46.0)
Hemoglobin: 12.3 g/dL (ref 12.0–15.0)
MCH: 29.1 pg (ref 26.0–34.0)
MCHC: 34.4 g/dL (ref 30.0–36.0)
MCV: 84.6 fL (ref 80.0–100.0)
Platelets: 209 10*3/uL (ref 150–400)
RBC: 4.23 MIL/uL (ref 3.87–5.11)
RDW: 12.3 % (ref 11.5–15.5)
WBC: 6.4 10*3/uL (ref 4.0–10.5)
nRBC: 0 % (ref 0.0–0.2)

## 2020-10-26 LAB — PLATELET INHIBITION P2Y12: Platelet Function  P2Y12: 40 [PRU] — ABNORMAL LOW (ref 182–335)

## 2020-10-26 LAB — APTT: aPTT: >200 seconds (ref 24–36)

## 2020-10-26 LAB — BASIC METABOLIC PANEL
Anion gap: 8 (ref 5–15)
BUN: 10 mg/dL (ref 8–23)
CO2: 22 mmol/L (ref 22–32)
Calcium: 8.9 mg/dL (ref 8.9–10.3)
Chloride: 110 mmol/L (ref 98–111)
Creatinine, Ser: 0.7 mg/dL (ref 0.44–1.00)
GFR, Estimated: >60 mL/min (ref >60)
Glucose, Bld: 107 mg/dL — ABNORMAL HIGH (ref 70–99)
Potassium: 3.4 mmol/L — ABNORMAL LOW (ref 3.5–5.1)
Sodium: 140 mmol/L (ref 135–145)

## 2020-10-26 LAB — SURGICAL PCR SCREEN
MRSA, PCR: NEGATIVE
Staphylococcus aureus: NEGATIVE

## 2020-10-26 LAB — HEPARIN LEVEL (UNFRACTIONATED): Heparin Unfractionated: 0.26 IU/mL — ABNORMAL LOW (ref 0.30–0.70)

## 2020-10-26 SURGERY — RADIOLOGY WITH ANESTHESIA
Anesthesia: General | Laterality: Left

## 2020-10-26 MED ORDER — LIDOCAINE 2% (20 MG/ML) 5 ML SYRINGE
INTRAMUSCULAR | Status: DC | PRN
  Administered 2020-10-26: 60 mg via INTRAVENOUS

## 2020-10-26 MED ORDER — "THROMBI-PAD 3""X3"" EX PADS"
1.0000 | MEDICATED_PAD | Freq: Once | CUTANEOUS | Status: AC
  Administered 2020-10-26: 1 via TOPICAL
  Filled 2020-10-26: qty 1

## 2020-10-26 MED ORDER — POTASSIUM CHLORIDE 20 MEQ PO PACK
40.0000 meq | PACK | Freq: Once | ORAL | Status: AC
  Administered 2020-10-26: 40 meq
  Filled 2020-10-26: qty 2

## 2020-10-26 MED ORDER — ASPIRIN 81 MG PO CHEW
81.0000 mg | CHEWABLE_TABLET | Freq: Every day | ORAL | Status: DC
  Administered 2020-10-27 – 2020-11-01 (×6): 81 mg
  Filled 2020-10-26 (×5): qty 1

## 2020-10-26 MED ORDER — ATORVASTATIN CALCIUM 80 MG PO TABS
80.0000 mg | ORAL_TABLET | Freq: Every day | ORAL | Status: DC
  Administered 2020-10-27 – 2020-11-01 (×6): 80 mg
  Filled 2020-10-26 (×6): qty 1

## 2020-10-26 MED ORDER — OXYCODONE HCL 5 MG/5ML PO SOLN: 5.0000 mg | Freq: Once | ORAL | Status: DC | PRN

## 2020-10-26 MED ORDER — HEPARIN (PORCINE) 25000 UT/250ML-% IV SOLN
500.0000 [IU]/h | INTRAVENOUS | Status: DC
  Administered 2020-10-26: 500 [IU]/h via INTRAVENOUS
  Filled 2020-10-26: qty 250

## 2020-10-26 MED ORDER — ROCURONIUM BROMIDE 10 MG/ML (PF) SYRINGE
PREFILLED_SYRINGE | INTRAVENOUS | Status: DC | PRN
  Administered 2020-10-26: 30 mg via INTRAVENOUS
  Administered 2020-10-26: 70 mg via INTRAVENOUS

## 2020-10-26 MED ORDER — IOHEXOL 300 MG/ML  SOLN
150.0000 mL | Freq: Once | INTRAMUSCULAR | Status: AC | PRN
  Administered 2020-10-26: 75 mL

## 2020-10-26 MED ORDER — IOHEXOL 300 MG/ML  SOLN
150.0000 mL | Freq: Once | INTRAMUSCULAR | Status: AC | PRN
  Administered 2020-10-26: 50 mL

## 2020-10-26 MED ORDER — CLEVIDIPINE BUTYRATE 0.5 MG/ML IV EMUL
INTRAVENOUS | Status: AC
  Administered 2020-10-26: 13 mg/h via INTRAVENOUS
  Filled 2020-10-26: qty 50

## 2020-10-26 MED ORDER — CLEVIDIPINE BUTYRATE 0.5 MG/ML IV EMUL
0.0000 mg/h | INTRAVENOUS | Status: AC
  Administered 2020-10-26: 8 mg/h via INTRAVENOUS
  Administered 2020-10-26: 12 mg/h via INTRAVENOUS
  Administered 2020-10-26: 8 mg/h via INTRAVENOUS
  Administered 2020-10-26: 10 mg/h via INTRAVENOUS
  Administered 2020-10-27: 9 mg/h via INTRAVENOUS
  Administered 2020-10-27: 21 mg/h via INTRAVENOUS
  Administered 2020-10-27: 7 mg/h via INTRAVENOUS
  Administered 2020-10-27: 10 mg/h via INTRAVENOUS
  Administered 2020-10-27: 21 mg/h via INTRAVENOUS
  Filled 2020-10-26 (×10): qty 50

## 2020-10-26 MED ORDER — VANCOMYCIN HCL IN DEXTROSE 1-5 GM/200ML-% IV SOLN
INTRAVENOUS | Status: AC
  Filled 2020-10-26: qty 200

## 2020-10-26 MED ORDER — HEPARIN SODIUM (PORCINE) 1000 UNIT/ML IJ SOLN
INTRAMUSCULAR | Status: DC | PRN
  Administered 2020-10-26: 3000 [IU] via INTRAVENOUS

## 2020-10-26 MED ORDER — PHENYLEPHRINE HCL-NACL 10-0.9 MG/250ML-% IV SOLN
INTRAVENOUS | Status: DC | PRN
  Administered 2020-10-26: 20 ug/min via INTRAVENOUS

## 2020-10-26 MED ORDER — DEXAMETHASONE SODIUM PHOSPHATE 10 MG/ML IJ SOLN
INTRAMUSCULAR | Status: DC | PRN
  Administered 2020-10-26: 8 mg via INTRAVENOUS

## 2020-10-26 MED ORDER — AMISULPRIDE (ANTIEMETIC) 5 MG/2ML IV SOLN: 10.0000 mg | Freq: Once | INTRAVENOUS | Status: DC | PRN

## 2020-10-26 MED ORDER — LEVETIRACETAM IN NACL 500 MG/100ML IV SOLN
500.0000 mg | Freq: Once | INTRAVENOUS | Status: AC
  Administered 2020-10-26: 500 mg via INTRAVENOUS
  Filled 2020-10-26: qty 100

## 2020-10-26 MED ORDER — SODIUM CHLORIDE (PF) 0.9 % IJ SOLN
INTRAVENOUS | Status: AC | PRN
  Administered 2020-10-26 (×4): 25 ug via INTRA_ARTERIAL

## 2020-10-26 MED ORDER — EPHEDRINE SULFATE 50 MG/ML IJ SOLN
INTRAMUSCULAR | Status: DC | PRN
  Administered 2020-10-26 (×2): 5 mg via INTRAVENOUS

## 2020-10-26 MED ORDER — ASPIRIN 81 MG PO CHEW
81.0000 mg | CHEWABLE_TABLET | Freq: Every day | ORAL | Status: DC
  Filled 2020-10-26: qty 1

## 2020-10-26 MED ORDER — ONDANSETRON HCL 4 MG/2ML IJ SOLN: 4.0000 mg | Freq: Once | INTRAMUSCULAR | Status: DC | PRN

## 2020-10-26 MED ORDER — SODIUM CHLORIDE 0.9 % IV SOLN: INTRAVENOUS | Status: DC

## 2020-10-26 MED ORDER — CLEVIDIPINE BUTYRATE 0.5 MG/ML IV EMUL
INTRAVENOUS | Status: DC | PRN
  Administered 2020-10-26: 2 mg/h via INTRAVENOUS

## 2020-10-26 MED ORDER — POLYETHYLENE GLYCOL 3350 17 G PO PACK
17.0000 g | PACK | Freq: Every day | ORAL | Status: DC | PRN
  Administered 2020-10-27: 17 g
  Filled 2020-10-26: qty 1

## 2020-10-26 MED ORDER — CLEVIDIPINE BUTYRATE 0.5 MG/ML IV EMUL
INTRAVENOUS | Status: AC
  Filled 2020-10-26: qty 50

## 2020-10-26 MED ORDER — ACETAMINOPHEN 650 MG RE SUPP: 650.0000 mg | RECTAL | Status: DC | PRN

## 2020-10-26 MED ORDER — ONDANSETRON HCL 4 MG/2ML IJ SOLN
INTRAMUSCULAR | Status: DC | PRN
  Administered 2020-10-26: 4 mg via INTRAVENOUS

## 2020-10-26 MED ORDER — POTASSIUM CHLORIDE CRYS ER 20 MEQ PO TBCR: 40.0000 meq | EXTENDED_RELEASE_TABLET | Freq: Once | ORAL | Status: DC

## 2020-10-26 MED ORDER — NITROGLYCERIN 1 MG/10 ML FOR IR/CATH LAB
INTRA_ARTERIAL | Status: AC
  Filled 2020-10-26: qty 10

## 2020-10-26 MED ORDER — TICAGRELOR 90 MG PO TABS
90.0000 mg | ORAL_TABLET | Freq: Two times a day (BID) | ORAL | Status: DC
  Administered 2020-10-26 – 2020-11-01 (×12): 90 mg
  Filled 2020-10-26 (×11): qty 1

## 2020-10-26 MED ORDER — TICAGRELOR 90 MG PO TABS
90.0000 mg | ORAL_TABLET | Freq: Two times a day (BID) | ORAL | Status: DC
  Filled 2020-10-26 (×2): qty 1

## 2020-10-26 MED ORDER — VANCOMYCIN HCL 1000 MG IV SOLR
INTRAVENOUS | Status: DC | PRN
  Administered 2020-10-26: 1000 mg via INTRAVENOUS

## 2020-10-26 MED ORDER — ACETAMINOPHEN 160 MG/5ML PO SOLN: 650.0000 mg | ORAL | Status: DC | PRN

## 2020-10-26 MED ORDER — ORAL CARE MOUTH RINSE
15.0000 mL | Freq: Two times a day (BID) | OROMUCOSAL | Status: DC
  Administered 2020-10-27 – 2020-11-01 (×9): 15 mL via OROMUCOSAL

## 2020-10-26 MED ORDER — HEPARIN (PORCINE) 25000 UT/250ML-% IV SOLN
550.0000 [IU]/h | INTRAVENOUS | Status: DC
  Administered 2020-10-26: 550 [IU]/h via INTRAVENOUS
  Filled 2020-10-26: qty 250

## 2020-10-26 MED ORDER — NITROGLYCERIN 0.2 MG/ML ON CALL CATH LAB
INTRAVENOUS | Status: DC | PRN
  Administered 2020-10-26: 60 ug via INTRAVENOUS

## 2020-10-26 MED ORDER — HEPARIN (PORCINE) 25000 UT/250ML-% IV SOLN
500.0000 [IU]/h | INTRAVENOUS | Status: DC
  Filled 2020-10-26: qty 250

## 2020-10-26 MED ORDER — CHLORHEXIDINE GLUCONATE 0.12 % MT SOLN
15.0000 mL | Freq: Two times a day (BID) | OROMUCOSAL | Status: DC
  Administered 2020-10-26 – 2020-11-01 (×12): 15 mL via OROMUCOSAL
  Filled 2020-10-26 (×6): qty 15

## 2020-10-26 MED ORDER — ACETAMINOPHEN 325 MG PO TABS: 650.0000 mg | ORAL_TABLET | ORAL | Status: DC | PRN

## 2020-10-26 MED ORDER — FENTANYL CITRATE (PF) 100 MCG/2ML IJ SOLN: 25.0000 ug | INTRAMUSCULAR | Status: DC | PRN

## 2020-10-26 MED ORDER — OXYCODONE HCL 5 MG PO TABS: 5.0000 mg | ORAL_TABLET | Freq: Once | ORAL | Status: DC | PRN

## 2020-10-26 MED ORDER — PROPOFOL 10 MG/ML IV BOLUS
INTRAVENOUS | Status: DC | PRN
  Administered 2020-10-26: 100 mg via INTRAVENOUS
  Administered 2020-10-26: 30 mg via INTRAVENOUS

## 2020-10-26 MED ORDER — SENNOSIDES-DOCUSATE SODIUM 8.6-50 MG PO TABS
1.0000 | ORAL_TABLET | Freq: Every day | ORAL | Status: DC
  Administered 2020-10-26 – 2020-10-31 (×4): 1
  Filled 2020-10-26 (×3): qty 1

## 2020-10-26 MED ORDER — FENTANYL CITRATE (PF) 250 MCG/5ML IJ SOLN
INTRAMUSCULAR | Status: DC | PRN
  Administered 2020-10-26: 100 ug via INTRAVENOUS

## 2020-10-26 MED ORDER — SUGAMMADEX SODIUM 200 MG/2ML IV SOLN
INTRAVENOUS | Status: DC | PRN
  Administered 2020-10-26: 200 mg via INTRAVENOUS

## 2020-10-26 NOTE — Progress Notes (Signed)
ANTICOAGULATION CONSULT NOTE - Initial Consult  Pharmacy Consult:  Heparin Indication:  Post IR Procedure  Allergies  Allergen Reactions  . Penicillins     unk reaction    Patient Measurements: Height: 5\' 2"  (157.5 cm) Weight: 69.9 kg (154 lb) IBW/kg (Calculated) : 50.1 Heparin Dosing Weight: 70 kg  Vital Signs: Temp: 97.5 F (36.4 C) (03/16 1315) Temp Source: Oral (03/16 0726) BP: 114/46 (03/16 1400) Pulse Rate: 69 (03/16 1400)  Labs: Recent Labs    10/24/20 0448 10/25/20 0224 10/26/20 0413  HGB 12.8 11.9* 12.3  HCT 38.1 33.4* 35.8*  PLT 220 202 209  LABPROT  --  13.5  --   INR  --  1.1  --   CREATININE 0.69 0.65 0.70    Estimated Creatinine Clearance: 62.5 mL/min (by C-G formula based on SCr of 0.7 mg/dL).   Medical History: Past Medical History:  Diagnosis Date  . Hypercholesteremia   . Hypertension   . Osteopenia      Assessment: 47 YOF presented with speech difficulty and right-sided weakness, found to have left MCA infarcts.  S/p cerebral arteriogram with revascularization/stenting on 10/26/20.  Pharmacy consulted for heparin dosing post IR procedure.  Labs reviewed.  Goal of Therapy:  Heparin level 0.1-0.25 units/ml Monitor platelets by anticoagulation protocol: Yes   Plan:  Increase heparin gtt to 550 units/hr - off on 3/17 at 0700 Check 6 hr heparin level  Jyasia Markoff D. Mina Marble, PharmD, BCPS, [place] 10/26/2020, 2:41 PM

## 2020-10-26 NOTE — Procedures (Signed)
Cortrak  Person Inserting Tube:  Rosezetta Schlatter, RD Tube Type:  Cortrak - 43 inches Tube Location:  Left nare Initial Placement:  Stomach Secured by: Bridle Technique Used to Measure Tube Placement:  Documented cm marking at nare/ corner of mouth Cortrak Secured At:  65 cm Procedure Comments:  Cortrak Tube Team Note:  Consult received to place a Cortrak feeding tube.   No x-ray is required. RN may begin using tube.   If the tube becomes dislodged please keep the tube and contact the Cortrak team at [email or web address] ([email or web address]) for replacement.  If after hours and replacement cannot be delayed, place a NG tube and confirm placement with an abdominal x-ray.      Jarome Matin, MS, RD, LDN, CNSC Inpatient Clinical Dietitian RD pager # available in [place]  After hours/weekend pager # available in [facility]

## 2020-10-26 NOTE — Transfer of Care (Signed)
Immediate Anesthesia Transfer of Care Note  Patient: Brittany Griffith  Procedure(s) Performed: RADIOLOGY WITH ANESTHESIA LEFT MCA STENT (Left )  Patient Location: PACU  Anesthesia Type:General  Level of Consciousness: drowsy and responds to stimulation  Airway & Oxygen Therapy: Patient Spontanous Breathing and Patient connected to face mask oxygen  Post-op Assessment: Report given to RN, Post -op Vital signs reviewed and stable and Moving left side only. Dr. Estanislado Pandy aware  Post vital signs: Reviewed and stable  Last Vitals:  Vitals Value Taken Time  BP 109/43 10/26/20 1245  Temp    Pulse 67 10/26/20 1253  Resp 14 10/26/20 1253  SpO2 100 % 10/26/20 1253  Vitals shown include unvalidated device data.  Last Pain:  Vitals:   10/26/20 0726  TempSrc: Oral  PainSc:          Complications: No complications documented.

## 2020-10-26 NOTE — Progress Notes (Addendum)
Upon vascular check, this nurse noticed patient's groin site was oozing. Began holding pressure and called Dr. Estanislado Pandy who gave VO to stop Heparin gtt for 60 minutes and hold pressure for 20 minutes.  IR tech now here.

## 2020-10-26 NOTE — Progress Notes (Signed)
Patient went to IR this morning, will be in the neuro ICU afterwards under neurology / IR care as primary. Will see once out of the ICU. D/w Dr Margy Clarks. Cruzita Lederer, MD, PhD [facility]  Between 7 am - 7 pm you can contact me via [place] or [place].  I am not available 7 pm - 7 am, please contact night coverage MD/APP via Amion

## 2020-10-26 NOTE — Progress Notes (Signed)
Date and time results received: 10/26/20 1620  Test: aPTT Critical Value: >200  Name of Provider Notified: Dr. Estanislado Pandy  Orders Received? Or Actions Taken?: no new orders

## 2020-10-26 NOTE — Sedation Documentation (Signed)
Pt extubated

## 2020-10-26 NOTE — Sedation Documentation (Signed)
SBAR given to Anderson Malta, Therapist, sports at bedside, [employer] 9. All questions answered to satisfaction. Groin and pulses unchanged, see flowsheet.

## 2020-10-26 NOTE — Anesthesia Procedure Notes (Signed)
Arterial Line Insertion Start/End3/16/2022 9:20 AM, 10/26/2020 9:22 AM Performed by: Myna Bright, CRNA, CRNA  Preanesthetic checklist: patient identified, IV checked, risks and benefits discussed, surgical consent, monitors and equipment checked, pre-op evaluation and timeout performed Patient sedated Left, radial was placed Catheter size: 20 G Hand hygiene performed  and maximum sterile barriers used  Allen's test indicative of satisfactory collateral circulation Attempts: 1 Procedure performed without using ultrasound guided technique. Following insertion, dressing applied and Biopatch. Post procedure assessment: normal  Patient tolerated the procedure well with no immediate complications.

## 2020-10-26 NOTE — Progress Notes (Signed)
IR Team was notified that the right groin site was bleeding at 1635 hrs. I went up to the patient's room and observed that the right groin site was oozing but did not have a hematoma at this time. Manual pressure was held at 1642 hrs for 18 min and hemostasis was achieved at 1700 hrs. Hemostasis/groin site/distal pulses were all verified by Anderson Malta RN at this time. A quikclot dressing/gauze/tegaderm was applied to the groin site. The quikclot dressing will need to come off after 24 hrs.

## 2020-10-26 NOTE — Progress Notes (Signed)
ANTICOAGULATION CONSULT NOTE  Pharmacy Consult:  Heparin Indication:  Post-IR Procedure  Allergies  Allergen Reactions  . Penicillins     unk reaction    Patient Measurements: Height: 5\' 2"  (157.5 cm) Weight: 69.9 kg (154 lb) IBW/kg (Calculated) : 50.1 Heparin Dosing Weight: 64.8 kg  Vital Signs: Temp: 97.9 F (36.6 C) (03/16 2000) Temp Source: Axillary (03/16 2000) BP: 105/52 (03/16 2130) Pulse Rate: 76 (03/16 2145)  Labs: Recent Labs    10/24/20 0448 10/25/20 0224 10/26/20 0413 10/26/20 1430 10/26/20 2130  HGB 12.8 11.9* 12.3  --   --   HCT 38.1 33.4* 35.8*  --   --   PLT 220 202 209  --   --   APTT  --   --   --  >200*  --   LABPROT  --  13.5  --   --   --   INR  --  1.1  --   --   --   HEPARINUNFRC  --   --   --   --  0.26*  CREATININE 0.69 0.65 0.70  --   --     Estimated Creatinine Clearance: 62.5 mL/min (by C-G formula based on SCr of 0.7 mg/dL).   Medical History: Past Medical History:  Diagnosis Date  . Hypercholesteremia   . Hypertension   . Osteopenia      Assessment: 61 YOF presented with speech difficulty and right-sided weakness, found to have left MCA infarcts. S/p cerebral arteriogram with revascularization/stenting on 10/26/20. Pharmacy consulted for heparin dosing post-IR procedure. Patient is not on anticoagulation PTA.  Initial heparin level very slightly high at 0.26. CBC wnl. No bleeding or issues with infusion per discussion with RN.  Goal of Therapy:  Heparin level 0.1-0.25 units/ml Monitor platelets by anticoagulation protocol: Yes   Plan:  Decrease heparin drip slightly to 500 units/hr Monitor daily heparin level and CBC, s/sx bleeding Heparin off on 3/17 at 0800 per protocol   Arturo Morton, PharmD, BCPS Please check AMION for all [place] contact numbers Clinical Pharmacist 10/26/2020 10:18 PM

## 2020-10-26 NOTE — Progress Notes (Signed)
NIR.  Patient underwent an image-guided cerebral arteriogram with revascularization of right MCA M1 stenosis using stent assisted angioplasty via right femoral approach this AM by Dr. Estanislado Pandy.  Received call from RN stating ooze from right femoral puncture site. This PA and Dr. Estanislado Pandy at bedside to assess.  Patient laying in bed, lethargic. Delsa Sale, RN at bedside holding pressure. Manual pressure was held for 15 minutes by Delsa Sale, RN; followed by additional manual pressure hold by Dr. Estanislado Pandy x 5 minutes. Hemostasis pad and pressure dressing (gauze + syringe + tape) was applied to site. Right femoral soft.  Family (husband, son, daughter, and daughter-in-law) at bedside following to help with communication. Patient aphasic. Tearful on exam. Follows simple commands.  Aphasic but follows simple commands. PERRL bilaterally. Can wiggle bilateral toes, hand grip intact bilaterally. Distal pulses (DPs) palpable bilaterally with Doppler.  Plan to stay in neuro ICU overnight. Restart IV Heparin, STAT PTT ordered per Dr. Estanislado Pandy. Right leg straight overnight- please remove hemostasis pad from right groin in 24 hours. Place Cortrak for Brilinta use- Brilinta 90 mg twice daily and Aspirin 81 mg once daily. Further plans per TRH/neurology- appreciate and agree with bedside. NIR to follow.   Bea Graff Otillia Cordone, PA-C 10/26/2020, 3:06 PM

## 2020-10-26 NOTE — Sedation Documentation (Signed)
Pt to go to PACU [place] [facility] for recovery.

## 2020-10-26 NOTE — Progress Notes (Signed)
Patient does move left side will grip left hand  no movement to right side prior to surgery skin is warm and dry

## 2020-10-26 NOTE — Progress Notes (Signed)
PT Cancellation Note  Patient Details Name: Brittany Griffith MRN: [id number] DOB: [birth year]   Cancelled Treatment:    Reason Eval/Treat Not Completed: Patient at procedure or test/unavailable Off unit at procedure. PT will re-attempt as time allows.   Alayna A. Gilford Rile PT, DPT Acute Rehabilitation Services Pager [phone] Office [phone]    Linna Hoff 10/26/2020, 8:38 AM

## 2020-10-26 NOTE — Procedures (Signed)
S/P bilateral common carotid arteriograms followed by stent assisted angioplasty of Lt MCA with TTICI  2C revascularization. Severe RT MCA M 1 stenosis.  Post procedure CT brain No ICH  Or mass effect. Extubated Maintaining O2 sats. Pupils 82mm RT = Lt  Gradually waking . Moving Lt LE spontaneously. Attempting to move RT LE . Squeezes weakly with Lt hand. RT groin soft. Distal pulses present in both feet. S.Shinita Mac MD

## 2020-10-26 NOTE — H&P (Signed)
  HPI:  The patient has had a H&P performed within the last 30 days, all history, medications, and exam have been reviewed. The patient denies any interval changes since the H&P.  Medications: Prior to Admission medications   Medication Sig Start Date End Date Taking? Authorizing Provider  Multiple Vitamins-Minerals (MULTIVITAMIN WITH MINERALS) tablet Take 1 tablet by mouth daily.   Yes [provider]     Vital Signs: BP (!) 190/73 (BP Location: Left Arm)   Pulse 61   Temp 98.9 F (37.2 C) (Oral)   Resp 14   Ht 5\' 2"  (1.575 m)   Wt 154 lb (69.9 kg)   SpO2 97%   BMI 28.17 kg/m   Physical Exam Vitals and nursing note reviewed.  Constitutional:      General: She is not in acute distress.    Comments: Lethargic.  Cardiovascular:     Rate and Rhythm: Normal rate and regular rhythm.     Heart sounds: Normal heart sounds. No murmur heard.   Pulmonary:     Effort: Pulmonary effort is normal. No respiratory distress.     Breath sounds: Normal breath sounds. No wheezing.  Skin:    General: Skin is warm and dry.  Neurological:     Comments: Lethargic. Can spontaneously move all extremities.     Mallampati Score:  MD Evaluation Airway: WNL Heart: WNL Abdomen: WNL Chest/ Lungs: WNL ASA  Classification: 3 Mallampati/Airway Score: Two  Labs:  CBC: Recent Labs    10/23/20 1138 10/23/20 1206 10/24/20 0448 10/25/20 0224 10/26/20 0413  WBC 6.8  --  6.5 7.6 6.4  HGB 13.4 12.9 12.8 11.9* 12.3  HCT 42.5 38.0 38.1 33.4* 35.8*  PLT 213  --  220 202 209    COAGS: Recent Labs    10/25/20 0224  INR 1.1    BMP: Recent Labs    10/23/20 1138 10/23/20 1206 10/24/20 0448 10/25/20 0224 10/26/20 0413  NA 139 141 137 140 140  K 3.6 3.5 3.7 3.5 3.4*  CL 108 109 106 110 110  CO2 21*  --  20* 23 22  GLUCOSE 94 91 143* 99 107*  BUN 11 12 14 10 10   CALCIUM 9.2  --  9.2 8.8* 8.9  CREATININE 0.67 0.70 0.69 0.65 0.70  GFRNONAA >60  --  >60 >60 >60     LIVER FUNCTION TESTS: Recent Labs    10/23/20 1138 10/24/20 0448  BILITOT 1.0 0.6  AST 22 18  ALT 17 15  ALKPHOS 54 53  PROT 6.9 6.6  ALBUMIN 3.8 3.5    Assessment/Plan:   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 Plan for image-guided cerebral arteriogram with possible revascularization (angioplasty, stent placement) of left MCA stenosis in IR with Dr. Estanislado Pandy and anesthesia tentatively for today IR scheduling. Will obtain CT head prior to procedure due to lethargy.   Signed: Earley Abide 10/26/2020, 8:26 AM

## 2020-10-26 NOTE — Progress Notes (Signed)
STROKE TEAM PROGRESS NOTE   INTERVAL HISTORY Her  , son, and daughter  at the bedside.   Patient underwent an image-guided cerebral arteriogram with revascularization of right MCA M1 stenosis using stent assisted angioplasty via right femoral approach this AM by Dr. Estanislado Pandy.  Patient is extubated postprocedure but still drowsy from anesthesia.  She barely sternal rub remains with expressive aphasia and is moving all extremities but left side more than right.  Vital signs are stable.  Blood pressure adequately controlled. CT scan of the head this morning shows evolving left MCA patchy infarcts without any hemorrhage Vitals:   10/26/20 1615 10/26/20 1630 10/26/20 1645 10/26/20 1700  BP:  (!) 112/52  (!) 115/48  Pulse: 76 71 79 77  Resp: 15 16 15 15   Temp:      TempSrc:      SpO2: 98% 97% 97% 97%  Weight:      Height:       CBC:  Recent Labs  Lab 10/24/20 0448 10/25/20 0224 10/26/20 0413  WBC 6.5 7.6 6.4  NEUTROABS 5.2  --   --   HGB 12.8 11.9* 12.3  HCT 38.1 33.4* 35.8*  MCV 85.4 84.1 84.6  PLT 220 202 782   Basic Metabolic Panel:  Recent Labs  Lab 10/24/20 0448 10/25/20 0224 10/26/20 0413  NA 137 140 140  K 3.7 3.5 3.4*  CL 106 110 110  CO2 20* 23 22  GLUCOSE 143* 99 107*  BUN 14 10 10   CREATININE 0.69 0.65 0.70  CALCIUM 9.2 8.8* 8.9  MG 1.8 1.8  --   PHOS  --  3.1  --    Lipid Panel:  Recent Labs  Lab 10/24/20 0448  CHOL 248*  TRIG 58  HDL 77  CHOLHDL 3.2  VLDL 12  LDLCALC 159*   HgbA1c:  Recent Labs  Lab 10/24/20 0448  HGBA1C 5.6   Urine Drug Screen:  Recent Labs  Lab 10/23/20 1430  LABOPIA NONE DETECTED  COCAINSCRNUR NONE DETECTED  LABBENZ NONE DETECTED  AMPHETMU NONE DETECTED  THCU NONE DETECTED  LABBARB NONE DETECTED     IMAGING past 24 hours  MRI BRAIN WO 10/24/2020 Acute infarct in the left MCA territory with significant progression since yesterday. This involves the insula and deep white matter with small areas of cortical  infarct. 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 No associated brain edema.  CT HEAD WO CONTRAST  Result Date: 10/24/2020 1. Appearance suspicious for new Left PCA territory ischemia in the occipital pole. However, this might be streak artifact related to the complex left skull lesion.  2. Cytotoxic edema now evident at the posterior left insula and mildly larger than the DWI lesion yesterday.  3. Stable brain otherwise. No acute hemorrhage or increased intracranial mass effect. 4. Stable complex left frontal convexity meningioma(s) and skull hyperostosis.   MR BRAIN WO CONTRAST  Result Date: 10/24/2020 IMPRESSION: Acute infarct in the left MCA territory with significant progression since yesterday. This involves the insula and deep white matter with small areas of cortical infarct. 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 No associated brain edema.   MR Brain W and Wo Contrast  Result Date: 10/23/2020 IMPRESSION:  1. Subcentimeter acute left  MCA infarcts in the left insula and parietal lobe.  2. 2.6 cm enhancing left frontal parafalcine mass consistent with a meningioma with mild edema and minimal rightward midline shift. This is associated with extensive dural ossification and left frontal skull hyperostosis along the anterior and lateral cerebral convexities. 3. Smaller meningiomas along the falx in the posterior left frontal region and laterally in the left sylvian fissure.  4. 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  CTA HEAD/NECK IMPRESSION: 1. Left frontal parafalcine mass with extensive  regional hyperostosis which may reflect an en plaque meningioma. Mild edema in the left frontal lobe. Trace local rightward midline shift. Brain MRI without and with contrast is recommended for further evaluation. 2. No evidence of an acute infarct or intracranial hemorrhage. 3. No large vessel occlusion. 4. Intracranial atherosclerosis including moderate right and severe left M1 stenoses and moderate bilateral P2 stenoses. 5. Widely patent cervical carotid and vertebral arteries. 6. 2.3 cm right thyroid nodule. Recommend thyroid US (ref: J Am Coll  EEG adult Result Date: 10/23/2020 IMPRESSION: This study is suggestive of cortical dysfunction in left temporal region which is non specific but could be secondary to underlying structural abnormality. No seizures or epileptiform discharges were seen throughout the recording. Brittany Griffith   CTA head Result date 10/25/2020 Evolving recent left MCA territory infarctions. No acute intracranial hemorrhage or significant mass effect.  Similar appearance of intracranial atherosclerosis compared to recent prior study.  PHYSICAL EXAM Physical Exam  HEENT-  Skull asymmetry due to hyperostosis. No trauma seen.    Lungs- Respirations unlabored Extremities- No edema  Neurological Examination  Mental Status:   Patient is sleepy.  She partially opens eyes to stimulation.  Unable to follow most verbal commands - will follow one step commands only. Speech output was sparse and mostly unintelligible,  . Does not answer any questions but appears to be making an effort to answer and nodding appropriately   Cranial Nerves: II: Visual fields cannot be formally tested   III,IV, VI: EOMI noncooperative  V,VII: Unable to formally touch temperature sensation, but reacts to touch bilaterally. Right facial droop is prominent when smiling.  VIII: Hearing intact to some commands.  IX,X: Unable to assess. Not following commands for mouth opening.  XI: Head is  midline XII: Did not protrude tongue.  Motor: RUE 4/5 grip, biceps, triceps and deltoid. Drift resent LUE 4+/5 grip, biceps, triceps and deltoid. RLE 5/5 hip and knee flexion LLE 5/5 hip and knee flexion Sensory: Reacts to touch x 4 but does not answer questions regarding the intensity of the touch or temperature stimuli Cerebellar: ataxia on FN bilaterally Gait: Deferred due to falls risk concerns     ASSESSMENT/PLAN Ms. Brittany Griffith is a 70 y.o. female with history of hypertension who was found sitting on the floor slumped over, nonverbal and unable to follow commands, but awake, tracking with her eyes and crying.  Husband then lifted his wife off the floor and noticed right-sided weakness and a right-sided facial droop. LKN was 0930. She continued to exhibit the same symptoms on EMS arrival to the home, with resolution approximately 20 minutes later.  MRI revealed small strokes scattered in the left MCA territory in the context of severe left M1 stenosis seen on CTA. Of note, she also has multiple left hemisphere meningiomas, the largest being associated with mild edema and minimal rightward shift.  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  S/p left  MCA angioplasty and stenting 10/26/2020  CT head: left PCA territory ischemia    CTA head & neck: left frontal parafalcine mass with extensive regional hyperostosis which may reflect an en plaque meningioma. Mild edema in the left frontal lobe. Trace local rightward midline shift. Intracranial atherosclerosis including moderate right and severe left M1 stenoses and moderate bilateral P2 stenoses  MRI brain w/o: acute infarct in the left MCA territory with significant progression since yesterday. This involves the insula and deep white matter with small areas of cortical infarct. Negative for hemorrhagic transformation  MRA: ubcentimeter acute left MCA infarcts in the left insula and       parietal lobe.  2. 2.6 cm enhancing  left frontal parafalcine mass           consistent with a meningioma with mild edema and minimal                 rightward midline shift. Smaller meningiomas along the falx in the       posterior left frontal region and laterally in the left sylvian fissure.  Repeat CTA head (3/15): Evolving recent left MCA territory infarctions. Stable appearance of left greater than right M1 stenoses. Distal MCA branch atherosclerotic irregularity is unchanged  2D Echo pending  LDL 159  HgbA1c 5.6  VTE prophylaxis - SCDs  Heart Healthy diet  No antithrombotic prior to admission, now on aspirin 81 mg daily and clopidogrel 75 mg daily.   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 No seizures or epileptiform discharges were seen throughout the recording.  Hypertension  Home meds:  none  Stable . Permissive hypertension (OK if < 220/120) but gradually normalize in 5-7 days . Long-term BP goal normotensive  Hyperlipidemia  Home meds:  none  LDL 159, goal < 70  Atorvastatin 80mg  daily  Continue statin at discharge  Other Stroke Risk Factors  Advanced Age >/= 50   Other Active Problems  2.3 cm right thyroid nodule. Recommend PCP followup and thyroid US  Left frontal meningioma likely asymptomatic and longstanding can be followed conservatively and no need for neurosurgical treatment at the present time.  Hospital day # 2 Patient had left MCA angioplasty stenting this morning appears sleepy likely from anesthesia effect expected to wake  up slowly.  Continue close neurological monitoring.and strict blood pressure control as per post intervention protocol.  Long discussion with patient,, daughter-in-law and her son   at the bedside and answered questions.  .  Discussed with Dr. Estanislado Pandy This patient is critically ill and at significant risk of neurological worsening, death and care requires constant monitoring of vital signs, hemodynamics,respiratory and cardiac monitoring, extensive review of multiple databases, frequent neurological assessment, discussion with family, other specialists and medical decision making of high complexity.I have made any additions or clarifications directly to the above note.This critical care time does not reflect procedure time, or teaching time or supervisory time of PA/NP/Med Resident etc but could involve care discussion time.  I spent 30 minutes of neurocritical care time  in the care of  this patient.  Antony Contras, MD   To contact Stroke Continuity provider, please refer to [email or web address] After hours, contact General Neurology

## 2020-10-26 NOTE — Anesthesia Procedure Notes (Signed)
Procedure Name: Intubation Date/Time: 10/26/2020 9:19 AM Performed by: Myna Bright, CRNA Pre-anesthesia Checklist: Patient identified, Emergency Drugs available, Suction available and Patient being monitored Patient Re-evaluated:Patient Re-evaluated prior to induction Oxygen Delivery Method: Circle system utilized Preoxygenation: Pre-oxygenation with 100% oxygen Induction Type: IV induction Ventilation: Mask ventilation without difficulty Laryngoscope Size: Mac and 4 Grade View: Grade II Tube type: Oral Tube size: 7.0 mm Number of attempts: 1 Airway Equipment and Method: Stylet Placement Confirmation: ETT inserted through vocal cords under direct vision,  positive ETCO2 and breath sounds checked- equal and bilateral Secured at: 21 cm Tube secured with: Tape Dental Injury: Teeth and Oropharynx as per pre-operative assessment

## 2020-10-26 NOTE — Anesthesia Preprocedure Evaluation (Addendum)
Anesthesia Evaluation  Patient identified by MRN, date of birth, ID band Patient unresponsive    Reviewed: Allergy & Precautions, NPO status , Patient's Chart, lab work & pertinent test results  History of Anesthesia Complications Negative for: history of anesthetic complications  Airway Mallampati: II  TM Distance: >3 FB Neck ROM: Full   Comment: Pt unable to follow instructions for full airway exam Dental  (+) Teeth Intact   Pulmonary neg pulmonary ROS,    Pulmonary exam normal        Cardiovascular hypertension, Normal cardiovascular exam     Neuro/Psych Meningioma L MCA stenosis CVA, Residual Symptoms negative psych ROS   GI/Hepatic negative GI ROS, Neg liver ROS,   Endo/Other  negative endocrine ROS  Renal/GU negative Renal ROS  negative genitourinary   Musculoskeletal negative musculoskeletal ROS (+)   Abdominal   Peds  Hematology negative hematology ROS (+)   Anesthesia Other Findings   Reproductive/Obstetrics                          Anesthesia Physical Anesthesia Plan  ASA: IV  Anesthesia Plan: General   Post-op Pain Management:    Induction: Intravenous  PONV Risk Score and Plan: 3 and Ondansetron, Dexamethasone, Treatment may vary due to age or medical condition and Midazolam  Airway Management Planned: Oral ETT  Additional Equipment: Arterial line  Intra-op Plan:   Post-operative Plan: Extubation in OR and Possible Post-op intubation/ventilation  Informed Consent: I have reviewed the patients History and Physical, chart, labs and discussed the procedure including the risks, benefits and alternatives for the proposed anesthesia with the patient or authorized representative who has indicated his/her understanding and acceptance.     Dental advisory given and Consent reviewed with POA  Plan Discussed with:   Anesthesia Plan Comments: (Very depressed mental  status, not following commands. This is apparently a significant change from yesterday. Plan is for CT scan prior to decision to proceed with procedure. Discussed GA with husband & daughter with possibility of postop ventilation requirement.  Update: Decision to proceed by Dr. Estanislado Pandy. Patient now following commands, able to open mouth and answer questions.)      Anesthesia Quick Evaluation

## 2020-10-26 NOTE — Progress Notes (Signed)
Inpatient Rehab Admissions Coordinator:   Met with patient, her spouse, her daughter and her son at the bedside.  Pt still sedated, but starting to wake up some.  Discussed that, based on participation with therapy yesterday, would expect estimated length of stay on CIR to be about 10-14 days, with goals of supervision.  Would like to see how she does with therapy once bed rest orders are able to lifted, to ensure goals remain appropriate.  Family is prepared to provide 24/7 supervision/assist to patient at discharge from [place].  Will open insurance for possible admission later this week pending medical stability.    Shann Medal, PT, DPT Admissions Coordinator [phone] 10/26/20  4:24 PM

## 2020-10-26 NOTE — Progress Notes (Signed)
SLP Cancellation Note  Patient Details Name: Brittany Griffith MRN: [id number] DOB: 03/08/1951   Cancelled treatment:        Checked on pt and spoke with RN- pt not yet alert and has Cortrak. Will follow up tomorrow   Houston Siren 10/26/2020, 4:53 PM

## 2020-10-26 NOTE — Procedures (Incomplete)
Patient left the unit for procedure with transport and family followed. Report was given to receiving anesthesia care providers.OR team requested, Plavix, Aspirin and Brillinta meds, to be tubed down to OR via Day RN.Patient was tearful upon leaving the unit support was given to the patient and family. Day RN, advised family to gather belongings.

## 2020-10-26 NOTE — Progress Notes (Signed)
Pt to 4N18 at 1359, R groin site soft on arrival with some bloody drainage on dressing. No personal belongings at bedside on arrival.  53: Teodoro Spray, RN monitoring site very closely and noted drainage had spread, lifted dressing to assess site and site oozing moderately but still soft. Began to hold pressure on site at that time, Dr. Estanislado Pandy paged and to come to bedside. V/O to hold pressure for 20 minutes.  Candy Sledge, RN

## 2020-10-27 ENCOUNTER — Encounter (HOSPITAL_COMMUNITY): Payer: Self-pay | Admitting: Interventional Radiology

## 2020-10-27 ENCOUNTER — Inpatient Hospital Stay (HOSPITAL_COMMUNITY): Payer: Medicare HMO

## 2020-10-27 DIAGNOSIS — I6300 Cerebral infarction due to thrombosis of unspecified precerebral artery: Secondary | ICD-10-CM

## 2020-10-27 LAB — CBC WITH DIFFERENTIAL/PLATELET
Abs Immature Granulocytes: 0.05 10*3/uL (ref 0.00–0.07)
Basophils Absolute: 0 10*3/uL (ref 0.0–0.1)
Basophils Relative: 0 %
Eosinophils Absolute: 0 10*3/uL (ref 0.0–0.5)
Eosinophils Relative: 0 %
HCT: 31.6 % — ABNORMAL LOW (ref 36.0–46.0)
Hemoglobin: 10.9 g/dL — ABNORMAL LOW (ref 12.0–15.0)
Immature Granulocytes: 1 %
Lymphocytes Relative: 25 %
Lymphs Abs: 2.7 10*3/uL (ref 0.7–4.0)
MCH: 28.8 pg (ref 26.0–34.0)
MCHC: 34.5 g/dL (ref 30.0–36.0)
MCV: 83.6 fL (ref 80.0–100.0)
Monocytes Absolute: 0.6 10*3/uL (ref 0.1–1.0)
Monocytes Relative: 5 %
Neutro Abs: 7.5 10*3/uL (ref 1.7–7.7)
Neutrophils Relative %: 69 %
Platelets: 202 10*3/uL (ref 150–400)
RBC: 3.78 MIL/uL — ABNORMAL LOW (ref 3.87–5.11)
RDW: 12.2 % (ref 11.5–15.5)
WBC: 10.8 10*3/uL — ABNORMAL HIGH (ref 4.0–10.5)
nRBC: 0 % (ref 0.0–0.2)

## 2020-10-27 LAB — BASIC METABOLIC PANEL
Anion gap: 7 (ref 5–15)
BUN: 7 mg/dL — ABNORMAL LOW (ref 8–23)
CO2: 19 mmol/L — ABNORMAL LOW (ref 22–32)
Calcium: 8.5 mg/dL — ABNORMAL LOW (ref 8.9–10.3)
Chloride: 113 mmol/L — ABNORMAL HIGH (ref 98–111)
Creatinine, Ser: 0.6 mg/dL (ref 0.44–1.00)
GFR, Estimated: >60 mL/min (ref >60)
Glucose, Bld: 106 mg/dL — ABNORMAL HIGH (ref 70–99)
Potassium: 3.3 mmol/L — ABNORMAL LOW (ref 3.5–5.1)
Sodium: 139 mmol/L (ref 135–145)

## 2020-10-27 LAB — PLATELET INHIBITION P2Y12: Platelet Function  P2Y12: 30 [PRU] — ABNORMAL LOW (ref 182–335)

## 2020-10-27 LAB — SODIUM
Sodium: 139 mmol/L (ref 135–145)
Sodium: 143 mmol/L (ref 135–145)

## 2020-10-27 MED ORDER — LABETALOL HCL 5 MG/ML IV SOLN
20.0000 mg | INTRAVENOUS | Status: AC | PRN
  Administered 2020-10-28: 20 mg via INTRAVENOUS
  Filled 2020-10-27 (×2): qty 4

## 2020-10-27 MED ORDER — AMLODIPINE BESYLATE 5 MG PO TABS
5.0000 mg | ORAL_TABLET | Freq: Every day | ORAL | Status: DC
  Filled 2020-10-27: qty 1

## 2020-10-27 MED ORDER — POTASSIUM CHLORIDE 20 MEQ PO PACK
40.0000 meq | PACK | ORAL | Status: AC
Start: 2020-10-27 — End: 2020-10-27
  Administered 2020-10-27 (×2): 40 meq
  Filled 2020-10-27 (×2): qty 2

## 2020-10-27 MED ORDER — POTASSIUM CHLORIDE CRYS ER 20 MEQ PO TBCR: 40.0000 meq | EXTENDED_RELEASE_TABLET | ORAL | Status: DC

## 2020-10-27 MED ORDER — SODIUM CHLORIDE 3 % IV SOLN
INTRAVENOUS | Status: DC
  Filled 2020-10-27 (×8): qty 500

## 2020-10-27 MED ORDER — AMLODIPINE BESYLATE 5 MG PO TABS
5.0000 mg | ORAL_TABLET | Freq: Every day | ORAL | Status: DC
  Administered 2020-10-27 – 2020-10-28 (×2): 5 mg
  Filled 2020-10-27: qty 1

## 2020-10-27 MED ORDER — LEVETIRACETAM 100 MG/ML PO SOLN
500.0000 mg | Freq: Two times a day (BID) | ORAL | Status: DC
  Administered 2020-10-27 – 2020-11-01 (×10): 500 mg
  Filled 2020-10-27 (×10): qty 5

## 2020-10-27 MED ORDER — LABETALOL HCL 5 MG/ML IV SOLN
20.0000 mg | INTRAVENOUS | Status: DC | PRN
  Administered 2020-10-27: 20 mg via INTRAVENOUS
  Filled 2020-10-27: qty 4

## 2020-10-27 NOTE — Progress Notes (Signed)
Daughter requested to take pictures of chart, I provided information for setting up a MyChart account as an alternative.

## 2020-10-27 NOTE — Progress Notes (Signed)
Patient's family requested exemption for visitation (wants 3 visitors or the children to switch out) two separate times today. Each time, this nurse inquired with charge nurse who stated no exemption should be given unless end of life care. This nurse explained each time that the patient could only have 2 visitors per day but they could switch out daily.   This nurse was then informed that three visitors were in the room. The charge nurse informed the family that no exemptions were given and escorted family to waiting room.

## 2020-10-27 NOTE — Progress Notes (Signed)
PT Cancellation Note  Patient Details Name: Morris Markham MRN: [id number] DOB: 15-Aug-1950   Cancelled Treatment:     Per RN, pt still on bedrest but hoping to come off bedrest later this date. Will follow-up as time permits.   Moishe Spice, PT, DPT Acute Rehabilitation Services  Pager: [phone] Office: [place] 10/27/2020, 5:30 PM

## 2020-10-27 NOTE — Progress Notes (Signed)
Informed stroke team of patient's SBP over 140 (on a-line) while maxed on cleviprex, they are currently rounding and will examine patient soon. Will continue to monitor.

## 2020-10-27 NOTE — Anesthesia Postprocedure Evaluation (Signed)
Anesthesia Post Note  Patient: Brittany Griffith  Procedure(s) Performed: RADIOLOGY WITH ANESTHESIA LEFT MCA STENT (Left )     Patient location during evaluation: PACU Anesthesia Type: General Level of consciousness: awake and alert Pain management: pain level controlled Vital Signs Assessment: post-procedure vital signs reviewed and stable Respiratory status: spontaneous breathing, nonlabored ventilation, respiratory function stable and patient connected to nasal cannula oxygen Cardiovascular status: blood pressure returned to baseline and stable Postop Assessment: no apparent nausea or vomiting Anesthetic complications: no   No complications documented.  Last Vitals:  Vitals:   10/27/20 0745 10/27/20 0800  BP:  (!) 122/56  Pulse: 70 74  Resp: 18 (!) 21  Temp:  37 C  SpO2: 96% 97%    Last Pain:  Vitals:   10/27/20 0800  TempSrc: Oral  PainSc:     LLE Motor Response: Purposeful movement (10/27/20 0800)   RLE Motor Response: Purposeful movement (10/27/20 0800)        [name] S

## 2020-10-27 NOTE — Progress Notes (Signed)
STROKE TEAM PROGRESS NOTE   INTERVAL HISTORY Daughter (English speaking) and husband at bedside.  S/p Left MCA angioplasty yesterday by Dr. Jaquita Folds.  RUE weakness noted. Remains nonverbal. Able to follow some simple commands. Plan to obtain Head CT.  BP control inadequate with maximum cleviprex dose per ICU RN. PRN labetolol x24 hours and daily po Norvasc added.  Stroke diagnosis, plan of care and ongoing work up discussed with family at the bedside. Questions answered.   Vitals:   10/27/20 0730 10/27/20 0745 10/27/20 0800 10/27/20 0830  BP: 104/85  (!) 122/56 (!) 120/50  Pulse: 70 70 74 65  Resp: 16 18 (!) 21 17  Temp:   98.6 F (37 C)   TempSrc:   Oral   SpO2: 98% 96% 97% 94%  Weight:      Height:       CBC:  Recent Labs  Lab 10/24/20 0448 10/25/20 0224 10/26/20 0413 10/27/20 0624  WBC 6.5   < > 6.4 10.8*  NEUTROABS 5.2  --   --  7.5  HGB 12.8   < > 12.3 10.9*  HCT 38.1   < > 35.8* 31.6*  MCV 85.4   < > 84.6 83.6  PLT 220   < > 209 202   < > = values in this interval not displayed.   Basic Metabolic Panel:  Recent Labs  Lab 10/24/20 0448 10/25/20 0224 10/26/20 0413 10/27/20 0624  NA 137 140 140 139  K 3.7 3.5 3.4* 3.3*  CL 106 110 110 113*  CO2 20* 23 22 19*  GLUCOSE 143* 99 107* 106*  BUN 14 10 10  7*  CREATININE 0.69 0.65 0.70 0.60  CALCIUM 9.2 8.8* 8.9 8.5*  MG 1.8 1.8  --   --   PHOS  --  3.1  --   --    Lipid Panel:  Recent Labs  Lab 10/24/20 0448  CHOL 248*  TRIG 58  HDL 77  CHOLHDL 3.2  VLDL 12  LDLCALC 159*   HgbA1c:  Recent Labs  Lab 10/24/20 0448  HGBA1C 5.6   Urine Drug Screen:  Recent Labs  Lab 10/23/20 1430  LABOPIA NONE DETECTED  COCAINSCRNUR NONE DETECTED  LABBENZ NONE DETECTED  AMPHETMU NONE DETECTED  THCU NONE DETECTED  LABBARB NONE DETECTED    IMAGING past 24 hours  MRI BRAIN WO 10/24/2020 Acute infarct in the left MCA territory with significant progression since yesterday. This involves the insula and deep  white matter with small areas of cortical infarct. 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 No associated brain edema.  CT HEAD WO CONTRAST  Result Date: 10/24/2020 1. Appearance suspicious for new Left PCA territory ischemia in the occipital pole. However, this might be streak artifact related to the complex left skull lesion.  2. Cytotoxic edema now evident at the posterior left insula and mildly larger than the DWI lesion yesterday.  3. Stable brain otherwise. No acute hemorrhage or increased intracranial mass effect. 4. Stable complex left frontal convexity meningioma(s) and skull hyperostosis.   MR BRAIN WO CONTRAST  Result Date: 10/24/2020 IMPRESSION: Acute infarct in the left MCA territory with significant progression since yesterday. This involves the insula and deep white matter with small areas of cortical infarct. 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 No associated brain edema.   MR Brain W and Wo Contrast  Result Date: 10/23/2020 IMPRESSION:  1. 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  2. 2.6 cm enhancing left frontal parafalcine mass consistent with a meningioma with mild edema and minimal rightward midline shift. This is associated with extensive dural ossification and left frontal skull hyperostosis along the anterior and lateral cerebral convexities. 3. Smaller meningiomas along the falx in the posterior left frontal region and laterally in the left sylvian fissure.  4. 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  CTA HEAD/NECK IMPRESSION: 1. Left  frontal parafalcine mass with extensive regional hyperostosis which may reflect an en plaque meningioma. Mild edema in the left frontal lobe. Trace local rightward midline shift. Brain MRI without and with contrast is recommended for further evaluation. 2. No evidence of an acute infarct or intracranial hemorrhage. 3. No large vessel occlusion. 4. Intracranial atherosclerosis including moderate right and severe left M1 stenoses and moderate bilateral P2 stenoses. 5. Widely patent cervical carotid and vertebral arteries. 6. 2.3 cm right thyroid nodule. Recommend thyroid US (ref: J Am Coll  EEG adult Result Date: 10/23/2020 IMPRESSION: This study is suggestive of cortical dysfunction in left temporal region which is non specific but could be secondary to underlying structural abnormality. No seizures or epileptiform discharges were seen throughout the recording. [name]   CTA head Result date 10/25/2020 Evolving recent left MCA territory infarctions. No acute intracranial hemorrhage or significant mass effect.  Similar appearance of intracranial atherosclerosis compared to recent prior study.  PHYSICAL EXAM Physical Exam  HEENT-  Skull asymmetry due to hyperostosis. No trauma seen.    Lungs- Respirations unlabored Extremities- No edema  Neurological Examination  Mental Status:   Patient is sleepy.  She partially opens eyes to stimulation.  Unable to follow most verbal commands - will follow one step commands only. Speech output was sparse and mostly unintelligible,  . Does not answer any questions but appears to be making an effort to answer and nodding appropriately   Cranial Nerves: II: Visual fields cannot be formally tested   III,IV, VI: EOMI noncooperative  V,VII: Unable to formally touch temperature sensation, but reacts to touch bilaterally. Right facial droop is prominent when smiling.  VIII: Hearing intact to some commands.  IX,X: Unable to assess. Not following  commands for mouth opening.  XI: Head is midline XII: Did not protrude tongue.  Motor: RUE 2/5 grip, biceps, triceps and deltoid. Drift present LUE 4+/5 grip, biceps, triceps and deltoid. RLE 4/5 hip and knee flexion LLE 5/5 hip and knee flexion Sensory: Reacts to touch x 4 but does not answer questions regarding the intensity of the touch or temperature stimuli Cerebellar: ataxia on FN bilaterally Gait: Deferred due to falls risk concerns   ASSESSMENT/PLAN Ms. Brittany Griffith is a 70 y.o. female with history of hypertension who was found sitting on the floor slumped over, nonverbal and unable to follow commands, but awake, tracking with her eyes and crying.  Husband then lifted his wife off the floor and noticed right-sided weakness and a right-sided facial droop. LKN was 0930. She continued to exhibit the same symptoms on EMS arrival to the home, with resolution approximately 20 minutes later.  MRI revealed small strokes scattered in the left MCA territory in the context of severe left M1 stenosis seen on CTA. Of note, she also has  multiple left hemisphere meningiomas, the largest being associated with mild edema and minimal rightward shift.  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  S/p left MCA angioplasty and stenting 10/26/2020  With infarct extension and cytotoxic edema on repeat imaging 10/27/20   -Admission CT head: left PCA territory ischemia   -Repeat HCT 3/17 due RUE weakness: re-occlusion of Left MCA. Extensive infarct throughout much of the left temporal lobe,posterior left frontal lobe, anterior left parietal lobe consistentwith evolving left middle cerebral artery distribution infarct. Thisinfarct includes portions of the left external capsule, claustrum,extreme capsule, and insular cortex. There is cytotoxic edema causing effacement of the left frontal horn and 6 mm of midline shift toward the right. Fourth ventricle midline. -AdmissionCTA head  & neck: left frontal parafalcine mass with extensive regional hyperostosis which may reflect an en plaque meningioma. Mild edema in the left frontal lobe. Trace local rightward midline shift. Intracranial atherosclerosis including moderate right and severe left M1 stenoses and moderate bilateral P2 stenoses -MRI brain w/o: acute infarct in the left MCA territory with significant progression since yesterday. This involves the insula and deep white matter with small areas of cortical infarct. Negative for hemorrhagic transformation -MRA: ubcentimeter acute left MCA infarcts in the left insula and parietal lobe.  2. 2.6 cm enhancing left frontal parafalcine mass  consistent with a meningioma with mild edema and minimal  rightward midline shift. Smaller meningiomas along the falx in the       posterior left frontal region and laterally in the left sylvian fissure. -Repeat CTA head (3/15): Evolving recent left MCA territory infarctions. Stable appearance of left greater than right M1 stenoses. Distal MCA branch atherosclerotic irregularity is unchanged  2D Echo pending  LDL 159  HgbA1c 5.6  VTE prophylaxis - SCDs  Heart Healthy diet  Swallow eval pending today   No antithrombotic prior to admission. On ASA 81mg        daily and Brilinta 90 mg BID   Therapy recommendations: CIR  Disposition:  pending  Meningiomas -Neurosurgery consulted: No acute neurosurgical intervention indicated  Left M1 stenosis -Dr. Estanislado Pandy consulted, patient taken to IR 3/16 where she underwent cerebral arteriogram with successful revascularization/stenting of Left MCA -follow up head CT 3/17 showing re-occlusion of Left MCA -On heparin gtt overnight -Radiology called twice overnight for oozing at R groin site  Concern for partial complex seizures -Brief episode of aphasia and right sided weakness at home -EEG: cortical dysfunction in left temporal region which is non specific but could be secondary to underlying  structural abnormality. No seizures or epileptiform discharges were seen throughout the recording.  Cerebral Edema with midline shift -HTS protocol initiated at 75cc/hr  -Monitor sodium q 6 hours per protocol -Na 139 prior to starting infusion -Continue Neuro ICU care  Hypertension -Home meds:  None -Remains hypertensive to 160s on maximum cleviprex dose. PRN labetolol (x 24 hours only) and Norvasc 5mg  daily added.  -Keep systolic BP less than 836, Avoid hypotension  -Long-term BP goal normotensive  Hyperlipidemia -Home meds:  none -LDL 159, goal < 70 -Atorvastatin 80mg  daily -Continue statin at discharge  Other Stroke Risk Factors -Advanced Age >/= 23   Other Active Problems -2.3 cm right thyroid nodule. Recommend PCP followup and thyroid US -Left frontal meningioma likely asymptomatic and longstanding can be followed conservatively and no need for neurosurgical treatment at the present time.  Hospital day # 3 Patient appears to have mild right hemiparesis due to infarct extension  on follow-up CT.  Recommend hypertonic saline at 75 cc an hour through peripheral IV.  Continue aspirin and Brilinta through NG tube.  Speech therapy for swallow eval.  Maintain strict control of blood pressure as per post NIR protocol.  Discussed with Dr. Tennis Must. Janean Sark.  Discussed with patient, husband, son and daughter and answered questions. This patient is critically ill and at significant risk of neurological worsening, death and care requires constant monitoring of vital signs, hemodynamics,respiratory and cardiac monitoring, extensive review of multiple databases, frequent neurological assessment, discussion with family, other specialists and medical decision making of high complexity.I have made any additions or clarifications directly to the above note.This critical care time does not reflect procedure time, or teaching time or supervisory time of PA/NP/Med Resident etc but could involve care  discussion time.  I spent 40 minutes of neurocritical care time  in the care of  this patient.  Antony Contras, MD  To contact Stroke Continuity provider, please refer to [email or web address] After hours, contact General Neurology

## 2020-10-27 NOTE — Progress Notes (Addendum)
[facility] Radiology called and requested I ensure Dr. Leonie Man sees the CT Head report ASAP. I Epic messaged Delilia NP who responded immediately.

## 2020-10-27 NOTE — PMR Pre-admission (Addendum)
PMR Admission Coordinator Pre-Admission Assessment  Patient: Brittany Griffith is an 70 y.o., female MRN: [id number] DOB: 1950/09/15 Height: 5' 2"  (157.5 cm) Weight: 57.8 kg              Insurance Information HMO:     PPO: yes     PCP:      IPA:      80/20:      OTHER:  PRIMARY: [employer] Medicare      Policy#: [id number]      Subscriber: pt CM Name: Ubaldo Glassing  Phone#: [phone] Fax#: [phone] Pre-Cert#: [id number] auth for CIR provided by Lattie Haw with [employer]. Approved for 5 days with updates due to [place] at fax listed above on 11/03/20      Employer: n/a Benefits:  Phone #: [phone]     Name:  Eff. Date: 08/13/20     Deduct: $0      Out of Pocket Max: $4500 ($0 met)       Life Max: n/a  CIR: $295/day for days 1-6      SNF: $188/day Outpatient:      Co-Pay: $35/visit Home Health: 100%      Co-Pay:  DME: 80%     Co-Pay: 20% Providers:  SECONDARY:       Policy#:       Phone#:   Development worker, community:       Phone#:   [facility] for patients in Inpatient Rehabilitation Facilities with attached Privacy Act [place] Records was provided and verbally reviewed with: Family  Emergency Contact Information Contact Information    Name Relation Home Work [place], [place] Spouse   [phone]   Lia Foyer   [phone]     Current Medical History  Patient Admitting Diagnosis: L MCA CVA  History of Present Illness: Brittany Griffith is a 70 y.o. right-handed limited English speaking female with history of hyperlipidemia as well as hypertension.  Presented 10/23/2020 with right side weakness, headache and slurred speech.  Noted blood pressure 200/77.  CT angiogram of head and neck showed left frontal parafalcine mass with extensive regional hyperostosis possibly reflecting en plaque meningioma.  Mild edema in the left frontal lobe.  Trace local rightward midline shift.  No large vessel occlusion.  MRI showed subcentimeter acute left MCA  infarct in the left insula and parietal lobe.  2.6 cm enhancing left frontal parafalcine mass consistent with meningioma minimal rightward midline shift.  Neurosurgery Dr. Venetia Constable did follow-up in regards to meningioma felt to be slow-growing and would follow-up outpatient after initial evaluation by neurology services for CVA.  Current plan is for cerebral arteriogram 10/25/2020 which demonstrated L MCA occlusion.  Pt underwent revascularization of L MCA on 3/16 per Dr. Patrecia Pour.  Repeat imaging on 3/17 due to persistent RUE weakness demonstrated re-occlusion of MCA.  Pt did require max doses of Cleviprex for BP control.  Maintained on aspirin and Plavix for CVA.  EEG negative for seizure.  Keppra added for seizure prophylaxis.  Echocardiogram with ejection fraction of 60 to 65% no wall motion abnormalities.  Therapy evaluations completed with recommendations physical medicine rehab consult due to right side weakness and speech difficulty.   Complete NIHSS TOTAL: 17 Glasgow Coma Scale Score: 11  Past Medical History  Past Medical History:  Diagnosis Date   Hypercholesteremia    Hypertension    Osteopenia     Family History  family history includes Heart attack in her father; Kidney disease in her brother; Stroke  in her father.  Prior Rehab/Hospitalizations:  Has the patient had prior rehab or hospitalizations prior to admission? No  Has the patient had major surgery during 100 days prior to admission? Yes  Current Medications   Current Facility-Administered Medications:    acetaminophen (TYLENOL) tablet 650 mg, 650 mg, Oral, Q4H PRN **OR** acetaminophen (TYLENOL) 160 MG/5ML solution 650 mg, 650 mg, Per Tube, Q4H PRN, 650 mg at 10/28/20 2140 **OR** acetaminophen (TYLENOL) suppository 650 mg, 650 mg, Rectal, Q4H PRN, Lavina Hamman, MD   amLODipine (NORVASC) tablet 10 mg, 10 mg, Per Tube, Daily, Bailey-Modzik, Delila A, NP, 10 mg at 10/31/20 1011   aspirin chewable tablet 81 mg, 81  mg, Oral, Daily **OR** aspirin chewable tablet 81 mg, 81 mg, Per Tube, Daily, Deveshwar, Sanjeev, MD, 81 mg at 10/31/20 1011   atorvastatin (LIPITOR) tablet 80 mg, 80 mg, Per Tube, Daily, Blenda Nicely, RPH, 80 mg at 10/31/20 1011   chlorhexidine (PERIDEX) 0.12 % solution 15 mL, 15 mL, Mouth Rinse, BID, Deveshwar, Sanjeev, MD, 15 mL at 10/31/20 2135   Chlorhexidine Gluconate Cloth 2 % PADS 6 each, 6 each, Topical, Daily, Hall, Carole N, DO, 6 each at 10/30/20 1013   enoxaparin (LOVENOX) injection 40 mg, 40 mg, Subcutaneous, Q24H, Rosalin Hawking, MD, 40 mg at 10/31/20 2129   feeding supplement (OSMOLITE 1.2 CAL) liquid 600 mL, 600 mL, Per Tube, Q24H, Rosalin Hawking, MD, Last Rate: 60 mL/hr at 10/31/20 2118, 600 mL at 10/31/20 2118   feeding supplement (PROSource TF) liquid 45 mL, 45 mL, Per Tube, BID, Rosalin Hawking, MD, 45 mL at 10/31/20 2135   free water 100 mL, 100 mL, Per Tube, Q4H, Rosalin Hawking, MD, 100 mL at 11/01/20 0411   hydrALAZINE (APRESOLINE) injection 5-10 mg, 5-10 mg, Intravenous, Q4H PRN, Rosalin Hawking, MD, 10 mg at 10/31/20 1610   insulin aspart (novoLOG) injection 0-9 Units, 0-9 Units, Subcutaneous, Q4H, Donnetta Simpers, MD, 1 Units at 11/01/20 0408   levETIRAcetam (KEPPRA) 100 MG/ML solution 500 mg, 500 mg, Per Tube, BID, Cruzita Lederer, Costin M, MD, 500 mg at 10/31/20 2133   losartan (COZAAR) tablet 50 mg, 50 mg, Per Tube, BID, Olivencia-Simmons, Ivelisse, NP, 50 mg at 10/31/20 2133   MEDLINE mouth rinse, 15 mL, Mouth Rinse, q12n4p, Deveshwar, Sanjeev, MD, 15 mL at 10/31/20 1753   polyethylene glycol (MIRALAX / GLYCOLAX) packet 17 g, 17 g, Per Tube, Daily PRN, Blenda Nicely, RPH, 17 g at 10/27/20 1813   potassium chloride (KLOR-CON) packet 40 mEq, 40 mEq, Per Tube, Q4H, Olivencia-Simmons, Ivelisse, NP   Resource [employer], , Oral, PRN, Garvin Fila, MD   senna-docusate (Senokot-S) tablet 1 tablet, 1 tablet, Per Tube, QHS, Blenda Nicely, RPH, 1 tablet at 10/31/20  2133   ticagrelor (BRILINTA) tablet 90 mg, 90 mg, Oral, BID **OR** ticagrelor (BRILINTA) tablet 90 mg, 90 mg, Per Tube, BID, Deveshwar, Sanjeev, MD, 90 mg at 10/31/20 2133  Patients Current Diet:  Diet Order            DIET - DYS 1 Room service appropriate? No; Fluid consistency: Honey Thick  Diet effective now                 Precautions / Restrictions Precautions Precautions: Fall Precaution Comments: R neglect Restrictions Weight Bearing Restrictions: No   Has the patient had 2 or more falls or a fall with injury in the past year?No  Prior Activity Level Community (5-7x/wk): working part time, driving, and active in her  community, no DME at baseline  Prior Functional Level Prior Function Level of Independence: Independent 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, driving, independent and very active in [place]: Did the patient need help bathing, dressing, using the toilet or eating?  Independent  Indoor Mobility: Did the patient need assistance with walking from room to room (with or without device)? Independent  Stairs: Did the patient need assistance with internal or external stairs (with or without device)? Independent  Functional Cognition: Did the patient need help planning regular tasks such as shopping or remembering to take medications? Independent  Home Assistive Devices / Equipment Home Assistive Devices/Equipment: None Home Equipment: None  Prior Device Use: Indicate devices/aids used by the patient prior to current illness, exacerbation or injury? noen  Current Functional Level Cognition  Arousal/Alertness: Awake/alert Overall Cognitive Status: Difficult to assess Difficult to assess due to: Impaired communication Current Attention Level: Sustained Orientation Level: Other (comment) (unable to assess) Following Commands: Follows one step commands inconsistently,Follows one step commands with increased time Safety/Judgement: Decreased awareness  of deficits,Decreased awareness of safety General Comments: Difficult to assess due to decreased verbalization, consistent with yes/no when asked simple questions. Pt moving impulsively at times, but redirectable. Attention: Sustained Sustained Attention: Appears intact Memory:  (unable to assess secondary to aphasia) Awareness: Impaired Awareness Impairment: Intellectual impairment Problem Solving: Impaired Problem Solving Impairment: Functional basic Executive Function: Sequencing Sequencing: Impaired Sequencing Impairment: Functional basic    Extremity Assessment (includes Sensation/Coordination)  Upper Extremity Assessment: Defer to OT evaluation RUE Deficits / Details: Trace active movement. Increased edema. WFL for PROM RUE Sensation: decreased proprioception,decreased light touch RUE Coordination: decreased fine motor,decreased gross motor  Lower Extremity Assessment: RLE deficits/detail RLE Deficits / Details: grossly 3/5, pt able to move against gravity with visual cues, unable to complete MMT due to inconsistent command following. R knee buckling in stance initially, progressed to minA to stand without buckling    ADLs  Overall ADL's : Needs assistance/impaired Eating/Feeding: Moderate assistance,Sitting Eating/Feeding Details (indicate cue type and reason): Mod A for bilateral coorindation due to decreased functional use of RUE. pt using spoon to scoop ice cream and bring to mouth Grooming: Wash/dry hands,Wash/dry face,Moderate assistance Grooming Details (indicate cue type and reason): standing at sink able to engage in washing hands with min assist for task sequencing/R hand attention, min-mod assist for balance and perseverating on washing face (bed level continues to perseverate on washing face and requied max assist to wash hands) Toilet Transfer: +2 for physical assistance,RW,Minimal assistance,Ambulation (simualted to recliner) Functional mobility during ADLs: Moderate  assistance,+2 for physical assistance,+2 for safety/equipment,Cueing for sequencing,Maximal assistance General ADL Comments: Pt demonsatrating high motivation. Performing functional mobility with Mod-Max A +2 and then participating in self feeding while sitting in recliner    Mobility  Overal bed mobility: Needs Assistance Bed Mobility: Rolling,Sidelying to Sit Rolling: Mod assist Sidelying to sit: Mod assist Supine to sit: Supervision Sit to supine: Min assist General bed mobility comments: modA to roll, pt able to complete lateral movement of BLE x1, needed minA on second attempt to move. modA to elevate trunk from elevated HOB    Transfers  Overall transfer level: Needs assistance Equipment used: 2 person hand held assist Transfers: Sit to/from [place] to Stand: +2 physical assistance,+2 safety/equipment,Min assist Stand pivot transfers: Mod assist,+2 safety/equipment,+2 physical assistance General transfer comment: minA to power up, blocking R knee pt able to maintain with BUE support and blocking to R knee    Ambulation /  Gait / Stairs / Emergency planning/management officer  Ambulation/Gait Ambulation/Gait assistance: Max assist,Mod assist,+2 physical assistance Gait Distance (Feet): 4 Feet (+ 8 ft) Assistive device: 2 person hand held assist Gait Pattern/deviations: Step-to pattern,Decreased step length - left,Decreased stance time - right,Decreased weight shift to right General Gait Details: pt relying on +2 to steady, blocking of R knee with stance, modA to advance RLE with each step but pt able to initiate hip flexion Gait velocity: decr Gait velocity interpretation: <1.31 ft/sec, indicative of household ambulator    Posture / Balance Dynamic Sitting Balance Sitting balance - Comments: can maintain static sitting without LE support, however, slight impuslivity today leaning outside BOS which requires minA to return to upright as pt is unable to initiate  correction Balance Overall balance assessment: Needs assistance Sitting-balance support: No upper extremity supported,Feet supported Sitting balance-Leahy Scale: Fair Sitting balance - Comments: can maintain static sitting without LE support, however, slight impuslivity today leaning outside BOS which requires minA to return to upright as pt is unable to initiate correction Postural control: Right lateral lean Standing balance support: No upper extremity supported Standing balance-Leahy Scale: Poor Standing balance comment: Reliant on physical A    Special needs/care consideration Special service needs interpreter     Previous Home Environment (from acute therapy documentation) Living Arrangements: Spouse/significant other,Children  Lives With: Spouse Available Help at Discharge: Family,Available 24 hours/day Type of Home: House Home Layout: One level Home Access: Stairs to enter Entrance Stairs-Rails: None Entrance Stairs-Number of Steps: 1 Bathroom Shower/Tub: Art therapist: Programmer, systems: Yes Home Care Services: No Additional Comments: family reports spouse was working but not plans to stay with pt  Discharge Living Setting Plans for Discharge Living Setting: Patient's home Type of Home at Discharge: House Discharge Home Layout: One level Discharge Home Access: Stairs to enter Entrance Stairs-Rails: None Entrance Stairs-Number of Steps: 1 Discharge Bathroom Shower/Tub: Pharmacologist: Standard Discharge Bathroom Accessibility: Yes How Accessible: Accessible via walker Does the patient have any problems obtaining your medications?: No  Social/Family/Support Systems Patient Roles: Spouse Anticipated Caregiver: Than Wyline Copas (spouse); Javier Docker (son) Anticipated Caregiver's Contact Information: Than [phone]; Inocente Salles [phone] Ability/Limitations of Caregiver: n/a Caregiver  Availability: 24/7 Discharge Plan Discussed with Primary Caregiver: Yes Is Caregiver In Agreement with Plan?: Yes Does Caregiver/Family have Issues with Lodging/Transportation while Pt is in Rehab?: No   Goals Patient/Family Goal for Rehab: PT/OT supervision to min assist, SLP min assist Expected length of stay: 9-12 Cultural Considerations: Nepali is first language (speaks 3 fluently, including English, plus 2 more partially) Additional Information: Plan for [place] interpreter at first Pt/Family Agrees to Admission and willing to participate: Yes Program Orientation Provided & Reviewed with Pt/Caregiver Including Roles  & Responsibilities: Yes  Barriers to Discharge: Insurance for SNF coverage   Decrease burden of Care through IP rehab admission: n/a  Possible need for SNF placement upon discharge: Not anticipated.  Reviewed goals of CIR admission, as well as expectation that pt will require 24/7 supervision/assist at discharge and family prepared to provide this level of care.    Patient Condition: This patient's medical and functional status has changed since the consult dated: 3/15 in which the Rehabilitation Physician determined and documented that the patient's condition is appropriate for intensive rehabilitative care in an inpatient rehabilitation facility. See "History of Present Illness" (above) for medical update. Functional changes are: pt max +2 for transfers. Patient's medical and functional status update has been discussed with the Rehabilitation physician  and patient remains appropriate for inpatient rehabilitation. Will admit to inpatient rehab today.  Preadmission Screen Completed By:  Michel Santee, PT, 11/01/2020 11:32 AM ______________________________________________________________________   Discussed status with Dr. Dagoberto Ligas on 11/01/20 at 11:32 AM  and received approval for admission today.  Admission Coordinator:   Michel Santee, time 11:32 AM Sudie Grumbling 11/01/20

## 2020-10-27 NOTE — Progress Notes (Signed)
  Speech Language Pathology Treatment: Dysphagia;Cognitive-Linquistic  Patient Details Name: Brittany Griffith MRN: [id number] DOB: [birth year] Today's Date: 10/27/2020 Time: 6269-4854 SLP Time Calculation (min) (ACUTE ONLY): 24 min  Assessment / Plan / Recommendation Clinical Impression  Pt's stroke has extended since initial swallow assessment and follow up in which regular texture, thin liquids recommended. A Cortrak placed yesterday. Husband at bedside and therapist  reviewed ST intervention and diagnostic treatment for swallow ability today. 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 There was no vocalization spontaneous nor elicited today. Prior to 3/15 pt would state an spontaneous occasional phrase.  Right facial weakness and moderate spill with water and s/s aspiration of immediate cough and delayed cough with teaspoon thin. No manipulation of ice chip and rapidly swallow it whole. Five spoons applesauce did not elicit s/s aspiration. Therapist recommends she continue NPO with oral care and MBS tomorrow.    HPI HPI: Patient is a 70 y.o. female with PMH: HLD, HTN, osteopenia who presented to hospital with complaints of speech difficulty and right sided weakness but on EMS arrival symptoms had completely resolved. In ER later in the day, MD reported patient started to have difficulty with getting her words out. 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 The next day (3/14) patient had abnormal neuro checks in AM and repeat MRI showed significant progression of acute infarct in the left MCA territory but negative for hemorrhagic transformation.      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

## 2020-10-27 NOTE — Progress Notes (Addendum)
Referring Physician(s): Dr. Cheral Marker  Supervising Physician: Pedro Earls  Patient Status:  [facility] - In-pt  Chief Complaint: L MCA stenosis  Subjective: Patient resting comfortably in bed. Lying flat due to groin oozing yesterday afternoon.  NGT in place.  Aphasic but following commands.  Appears to shake her head "no" to appropriate questions.  Decreased movement with right arm.  Family at bedside.   Allergies: Penicillins  Medications: Prior to Admission medications   Medication Sig Start Date End Date Taking? Authorizing Provider  Multiple Vitamins-Minerals (MULTIVITAMIN WITH MINERALS) tablet Take 1 tablet by mouth daily.   Yes [provider]     Vital Signs: BP 140/61   Pulse 65   Temp 98.5 F (36.9 C) (Axillary)   Resp 19   Ht 5\' 2"  (1.575 m)   Wt 154 lb (69.9 kg)   SpO2 95%   BMI 28.17 kg/m   Physical Exam  NAD, alert Groin: site intact.  No oozing today. No hematoma or pseudoaneurysm. Small area bruising at site appears stable.  Neuro: asphasic, no attempt to formulate speech.  Shakes head "no" to some questions.  Follows commands to squeeze hand or lift leg.  Able to squeeze with left hand, lift leg OOB, however limited movement with right arm/hand, draws right leg inward but decreased ability to lift off the bed.   Imaging: CT HEAD WO CONTRAST  Result Date: 10/27/2020 CLINICAL DATA:  Right upper extremity weakness EXAM: CT HEAD WITHOUT CONTRAST TECHNIQUE: Contiguous axial images were obtained from the base of the skull through the vertex without intravenous contrast. COMPARISON:  October 26, 2020 FINDINGS: Brain: Compared to 1 day prior, there is now cytotoxic edema and decreased attenuation throughout much of the anterior and mid left temporal lobe as well as portions of the anterior left parietal lobe and a portion of the posterior aspect of the left frontal lobe. This edema causes relative effacement of the left lateral ventricle.  There is now approximately 6 mm of midline shift to the right. Infarct is again noted involving the left external capsule, claustrum, extreme capsule as well as insular cortex anteriorly and midportion regions. Previous partially calcified meningioma in the medial left frontal region with extensive dural thickening/calcification in the left frontal region is stable consistent with meningioma. This appearance is stable. Calcification along the dura of the left anterior parietal lobe and anterior left temporal lobe is also stable. No new evident mass. No acute hemorrhage is evident. Vascular: There is a left middle cerebral artery hyperdense vessel consistent with occlusion of the left middle cerebral artery. There is calcification in each carotid siphon region. Skull: Bony calvarium appears intact. Meningioma changes on the left as noted above. Sinuses/Orbits: Mucosal thickening noted in the maxillary antra bilaterally with small air-fluid levels. Mucosal thickening noted in several ethmoid air cells. Orbits appear symmetric bilaterally. Other: Mastoid air cells clear. IMPRESSION: 1. Hyperdense vessel left middle cerebral artery consistent with occlusion of this vessel. 2. Extensive infarct throughout much of the left temporal lobe, posterior left frontal lobe, anterior left parietal lobe consistent with evolving left middle cerebral artery distribution infarct. This infarct includes portions of the left external capsule, claustrum, extreme capsule, and insular cortex. There is cytotoxic edema causing effacement of the left frontal horn and 6 mm of midline shift toward the right. Fourth ventricle midline. 3. Left frontal meningioma with extensive dural base calcification on the left persists without change. 4.  Foci of arterial vascular calcification noted. 5.  Areas of paranasal sinus disease noted. These results will be called to the ordering clinician or representative by the Radiologist Assistant, and  communication documented in the PACS or [employer]. Electronically Signed   By: Lowella Grip III M.D.   On: 10/27/2020 11:18   CT HEAD WO CONTRAST  Result Date: 10/26/2020 CLINICAL DATA:  Mental status change.  Lethargy.  Stroke. EXAM: CT HEAD WITHOUT CONTRAST TECHNIQUE: Contiguous axial images were obtained from the base of the skull through the vertex without intravenous contrast. COMPARISON:  MRI head 10/24/2020.  CT head 10/25/2020 FINDINGS: Brain: Evolving infarct left MCA territory. Hypodensity in the deep white matter on the left. Hypodensity in the left insula. These areas show restricted diffusion on recent MRI and are consistent with stable evolving infarct. No new infarct or hemorrhage. Chronic infarct right cerebellum unchanged. Ventricle size normal.  No midline shift. Extensive extra-axial dural base calcification in the left frontal parietal lobe compatible with meningioma, stable from the prior study. Mild associated edema in the left frontal white matter. Vascular: Negative for hyperdense vessel Skull: Thickening of the left frontal bone compatible with hyperostosis related to meningioma, stable Sinuses/Orbits: Mucosal edema and air-fluid levels in the maxillary sinus bilaterally. Negative orbit Other: None IMPRESSION: Evolving subacute infarcts in the left MCA territory, consistent with recent MRI. No new infarct or hemorrhage. Extensive dural ossification compatible with meningioma on the left. Electronically Signed   By: Franchot Gallo M.D.   On: 10/26/2020 10:10   CT HEAD WO CONTRAST  Result Date: 10/24/2020 CLINICAL DATA:  70 year old female with worsening NIH stroke scale. Punctate left MCA territory infarcts on MRI yesterday. EXAM: CT HEAD WITHOUT CONTRAST TECHNIQUE: Contiguous axial images were obtained from the base of the skull through the vertex without intravenous contrast. COMPARISON:  Brain MRI, CTA head and neck yesterday. FINDINGS: Brain: Suspicious appearance now  of the left occipital pole suggesting new cytotoxic edema there (series 4, image 12). Streak artifact from the complex skull lesion is felt less likely. Cytotoxic edema is identified in the posterior insula and appears larger than the DWI lesion there yesterday. But other gray-white matter differentiation in the left MCA territory appears stable. No acute intracranial hemorrhage identified. No ventriculomegaly. Chronic small cerebellar infarcts appear stable. Complex left frontal convexity meningioma(s) and associated mass effect appear stable. Possible mild anterior left frontal lobe vasogenic edema is stable. Vascular: Calcified atherosclerosis at the skull base. No suspicious intracranial vascular hyperdensity. 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 No acute osseous abnormality identified. Sinuses/Orbits: Stable. Small maxillary sinus fluid levels and mucoperiosteal thickening. Other: Stable orbit and scalp soft tissues. IMPRESSION: 1. Appearance suspicious for new Left PCA territory ischemia in the occipital pole. However, this might be streak artifact related to the complex left skull lesion. 2. Cytotoxic edema now evident at the posterior left insula and mildly larger than the DWI lesion yesterday. 3. Stable brain otherwise. No acute hemorrhage or increased intracranial mass effect. 4. Stable complex left frontal convexity meningioma(s) and skull hyperostosis. Electronically Signed   By: Genevie Ann M.D.   On: 10/24/2020 06:13   MR BRAIN WO CONTRAST  Result Date: 10/24/2020 CLINICAL DATA:  Stroke. Neuro change, with right facial droop and aphasia. EXAM: MRI HEAD WITHOUT CONTRAST TECHNIQUE: Multiplanar, multiecho pulse sequences of the brain and surrounding structures were obtained without intravenous contrast. COMPARISON:  MRI head 10/23/2020.  CT head 10/24/2020 FINDINGS: Brain: Restricted diffusion compatible with acute infarct on the left has progressed. This  involves the posterior insula as well as the deep white matter in the left posterior frontal lobe. Small areas of cortical infarct also new. No infarct in the left occipital lobe as questioned on CT. Small chronic infarct in the right cerebellum. No intracranial hemorrhage. Left hemispheric extra-axial mass is partially ossified and also has areas of fatty transformation. This is predominantly in the left frontal lobe but extends to the posterior parietal lobe. There is extensive hyperostosis of the left frontal bone. Mild mass-effect on the left frontal lobe. No midline shift. Ventricle size normal. Vascular: Normal arterial flow voids. Skull and upper cervical spine: Extensive hyperostosis left frontal bone consistent with meningioma. Sinuses/Orbits: Mucosal edema and air-fluid levels in the sphenoid sinus bilaterally. Normal orbit. Other: None IMPRESSION: Acute infarct in the left MCA territory with significant progression since yesterday. This involves the insula and deep white matter with small areas of cortical infarct. 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 No associated brain edema. Electronically Signed   By: Franchot Gallo M.D.   On: 10/24/2020 09:35   MR Brain W and Wo Contrast  Result Date: 10/23/2020 CLINICAL DATA:  TIA. Altered mental status. Left frontal parafalcine mass on CT. EXAM: MRI HEAD WITHOUT AND WITH CONTRAST TECHNIQUE: Multiplanar, multiecho pulse sequences of the brain and surrounding structures were obtained without and with intravenous contrast. CONTRAST:  87mL GADAVIST GADOBUTROL 1 MMOL/ML IV SOLN COMPARISON:  Head and neck CTA 10/23/2020 FINDINGS: Brain: There are subcentimeter acute infarcts in the posterior left insula and left parietal subcortical white matter (MCA territory). There is are small chronic  bilateral cerebellar infarcts. There is no intracranial hemorrhage, hydrocephalus, or extra-axial fluid collection. Scattered small foci of T2 hyperintensity in the cerebral white matter bilaterally are nonspecific but compatible with mild chronic small vessel ischemic disease. An avidly enhancing extra-axial mass along the left anterior falx in the frontal region measures 2.6 x 2.2 x 2.5 cm (AP x transverse x craniocaudal) with mild edema in the adjacent left frontal white matter and minimal localized rightward midline shift. 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 This extends inferiorly and laterally into the anterior and middle cranial fossae, and there is a 1.1 cm enhancing component which projects into the left sylvian fissure. A separate enhancing calcified mass along the left aspect of the falx in the posterior frontal region measures 1.5 cm. There is mild diffuse dural thickening along the upper portion of the falx. Vascular: Major intracranial vascular flow voids are preserved. Skull and upper cervical spine: Extensive left frontal hyperostosis. Sinuses/Orbits: Unremarkable orbits. Paranasal sinuses and mastoid air cells are clear. Other: None. IMPRESSION: 1. 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 2. 2.6 cm enhancing left frontal parafalcine mass consistent with a meningioma with mild edema and minimal rightward midline shift. This is associated with extensive dural ossification and left frontal skull hyperostosis along the anterior and lateral cerebral convexities. 3. Smaller meningiomas along the falx in the posterior left frontal region and laterally in the left sylvian fissure. 4. 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 Electronically Signed   By: Logan Bores M.D.   On: 10/23/2020 16:08   EEG  adult  Result Date: 10/23/2020 [name], MD  10/23/2020  7:38 PM Patient Name: Brittany Griffith MRN: [id number] Epilepsy Attending: [name] Referring Physician/Provider: DR Lesleigh Noe Date: 10/23/2020 Duration: 30.35 mins Patient history: 70 y.o. woman w/ 1h of aphasia and R sided weakness. EEG to evaluate for seizure Level of alertness: Awake, asleep AEDs during EEG study: Technical aspects: This EEG study was done with scalp electrodes positioned according to the 10-20 International system of electrode placement. Electrical activity was acquired at a sampling rate of 500Hz  and reviewed with a high frequency filter of 70Hz  and a low frequency filter of 1Hz . EEG data were recorded continuously and digitally stored. Description: The posterior dominant rhythm consists of 9-10 Hz activity of moderate voltage (25-35 uV) seen predominantly in posterior head regions, symmetric and reactive to eye opening and eye closing. Sleep was characterized by vertex waves, sleep spindles (12 to 14 Hz), maximal frontocentral region. EEG showed intermitten tleft temporal 2-3hz  delta slowing.  Hyperventilation and photic stimulation were not performed.   ABNORMALITY -Intermittent slow, left temporal IMPRESSION: This study is suggestive of cortical dysfunction in left temporal region which is non specific but could be secondary to underlying structural abnormality. No seizures or epileptiform discharges were seen throughout the recording. [name]   ECHOCARDIOGRAM COMPLETE  Result Date: 10/24/2020    ECHOCARDIOGRAM REPORT   Patient Name:   MACHELL WIRTHLIN Date of Exam: 10/24/2020 Medical Rec #:  [id number]     Height:       62.0 in Accession #:    [id number]    Weight:       154.0 lb Date of Birth:  26-Oct-1950    BSA:          20.711 m Patient Age:    70 years      BP:           171/60 mmHg Patient Gender: F             HR:           62 bpm. Exam Location:  Inpatient Procedure: 2D Echo, Cardiac Doppler, Color  Doppler and Intracardiac            Opacification Agent Indications:    Stroke  History:        Patient has no prior history of Echocardiogram examinations.                 Risk Factors:Hypertension and Dyslipidemia.  Sonographer:    Clayton Lefort RDCS (AE) Referring Phys: [id number] [place]  1. Left ventricular ejection fraction, by estimation, is 60 to 65%. The left ventricle has normal function. The left ventricle has no regional wall motion abnormalities. Left ventricular diastolic parameters are indeterminate.  2. Right ventricular systolic function is normal. The right ventricular size is normal. There is normal pulmonary artery systolic pressure.  3. Right atrial size was mildly dilated.  4. The mitral valve is normal in structure. Trivial mitral valve regurgitation.  5. The aortic valve is normal in structure. Aortic valve regurgitation is mild. No aortic stenosis is present. FINDINGS  Left Ventricle: Left ventricular ejection fraction, by estimation, is 60 to 65%. The left ventricle has normal function. The left ventricle has no regional wall motion abnormalities. Definity contrast agent was given IV to delineate the left ventricular  endocardial borders. The left ventricular internal cavity size was normal in size. There is borderline left ventricular hypertrophy. Left ventricular diastolic parameters are indeterminate. Right Ventricle: The right ventricular size is normal. No increase in  right ventricular wall thickness. Right ventricular systolic function is normal. There is normal pulmonary artery systolic pressure. The tricuspid regurgitant velocity is 2.29 m/s, and  with an assumed right atrial pressure of 3 mmHg, the estimated right ventricular systolic pressure is 55.7 mmHg. Left Atrium: Left atrial size was normal in size. Right Atrium: Right atrial size was mildly dilated. Pericardium: There is no evidence of pericardial effusion. Mitral Valve: The mitral valve is normal in structure.  Trivial mitral valve regurgitation. MV peak gradient, 2.9 mmHg. The mean mitral valve gradient is 1.0 mmHg. Tricuspid Valve: The tricuspid valve is grossly normal. Tricuspid valve regurgitation is trivial. Aortic Valve: The aortic valve is normal in structure. Aortic valve regurgitation is mild. No aortic stenosis is present. Aortic valve mean gradient measures 3.0 mmHg. Aortic valve peak gradient measures 5.1 mmHg. Aortic valve area, by VTI measures 1.94 cm. Pulmonic Valve: The pulmonic valve was normal in structure. Pulmonic valve regurgitation is not visualized. Aorta: The aortic root and ascending aorta are structurally normal, with no evidence of dilitation. IAS/Shunts: The atrial septum is grossly normal.  LEFT VENTRICLE PLAX 2D LVIDd:         4.10 cm  Diastology LVIDs:         2.90 cm  LV e' medial:    5.87 cm/s LV PW:         1.30 cm  LV E/e' medial:  11.0 LV IVS:        1.10 cm  LV e' lateral:   6.85 cm/s LVOT diam:     1.70 cm  LV E/e' lateral: 9.4 LV SV:         55 LV SV Index:   32 LVOT Area:     2.27 cm  RIGHT VENTRICLE             IVC RV Basal diam:  2.80 cm     IVC diam: 1.00 cm RV S prime:     10.60 cm/s TAPSE (M-mode): 2.7 cm LEFT ATRIUM             Index       RIGHT ATRIUM           Index LA diam:        2.80 cm 1.64 cm/m  RA Area:     19.20 cm LA Vol (A2C):   25.7 ml 15.02 ml/m RA Volume:   56.90 ml  33.26 ml/m LA Vol (A4C):   43.4 ml 25.37 ml/m LA Biplane Vol: 33.8 ml 19.76 ml/m  AORTIC VALVE AV Area (Vmax):    2.09 cm AV Area (Vmean):   1.88 cm AV Area (VTI):     1.94 cm AV Vmax:           113.00 cm/s AV Vmean:          81.600 cm/s AV VTI:            0.282 m AV Peak Grad:      5.1 mmHg AV Mean Grad:      3.0 mmHg LVOT Vmax:         104.00 cm/s LVOT Vmean:        67.600 cm/s LVOT VTI:          0.241 m LVOT/AV VTI ratio: 0.85  AORTA Ao Root diam: 3.30 cm Ao Asc diam:  3.00 cm MITRAL VALVE               TRICUSPID VALVE MV Area (PHT): 2.87 cm  TR Peak grad:   21.0 mmHg MV Area VTI:    1.63 cm    TR Vmax:        229.00 cm/s MV Peak grad:  2.9 mmHg MV Mean grad:  1.0 mmHg    SHUNTS MV Vmax:       0.85 m/s    Systemic VTI:  0.24 m MV Vmean:      41.9 cm/s   Systemic Diam: 1.70 cm MV Decel Time: 264 msec MV E velocity: 64.70 cm/s MV A velocity: 82.30 cm/s MV E/A ratio:  0.79 Mertie Moores MD Electronically signed by Mertie Moores MD Signature Date/Time: 10/24/2020/3:36:07 PM    Final    CT ANGIO HEAD CODE STROKE  Result Date: 10/25/2020 CLINICAL DATA:  Left MCA stroke, follow-up EXAM: CT ANGIOGRAPHY HEAD TECHNIQUE: Multidetector CT imaging of the head was performed using the standard protocol during bolus administration of intravenous contrast. Multiplanar CT image reconstructions and MIPs were obtained to evaluate the vascular anatomy. CONTRAST:  67mL OMNIPAQUE IOHEXOL 350 MG/ML SOLN COMPARISON:  None. FINDINGS: CT HEAD Brain: There are increased foci of hypoattenuation in the left MCA territory corresponding to areas of acute infarction seen on prior MRI. There is no acute intracranial hemorrhage or significant mass effect. Additional patchy hypoattenuation in the supratentorial white matter likely reflects stable chronic microvascular ischemic changes. 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 Ventricles are stable in size. Stable appearance of calcified left parafalcine meningioma with extensive adjacent hyperostosis. Vascular: No new abnormality. Skull: Calvarium is unremarkable apart from hyperostosis. Sinuses/Orbits: No acute finding. Other: None. CTA HEAD There is greater artifact on this study as compared to recent prior. Anterior circulation: Intracranial internal carotid arteries are patent. Mixed plaque results in moderate stenosis the paraclinoid portions. Anterior and middle cerebral arteries are patent. Stable appearance of left greater than right M1 stenoses. Additional more distal MCA branch atherosclerotic irregularity is unchanged. Anterior cerebral arteries are  patent. Middle and anterior cerebral arteries are patent. Posterior circulation: Intracranial vertebral, basilar, and posterior cerebral arteries are patent. Atherosclerotic irregularity of the posterior cerebral arteries with bilateral P2 segment stenoses again identified. Venous sinuses: Patent as permitted by contrast timing. Review of the MIP images confirms the above findings IMPRESSION: Evolving recent left MCA territory infarctions. No acute intracranial hemorrhage or significant mass effect. Similar appearance of intracranial atherosclerosis compared to recent prior study. Electronically Signed   By: Macy Mis M.D.   On: 10/25/2020 08:47    Labs:  CBC: Recent Labs    10/24/20 0448 10/25/20 0224 10/26/20 0413 10/27/20 0624  WBC 6.5 7.6 6.4 10.8*  HGB 12.8 11.9* 12.3 10.9*  HCT 38.1 33.4* 35.8* 31.6*  PLT 220 202 209 202    COAGS: Recent Labs    10/25/20 0224 10/26/20 1430  INR 1.1  --   APTT  --  >200*    BMP: Recent Labs    10/24/20 0448 10/25/20 0224 10/26/20 0413 10/27/20 0624  NA 137 140 140 139  K 3.7 3.5 3.4* 3.3*  CL 106 110 110 113*  CO2 20* 23 22 19*  GLUCOSE 143* 99 107* 106*  BUN 14 10 10  7*  CALCIUM 9.2 8.8* 8.9 8.5*  CREATININE 0.69 0.65 0.70 0.60  GFRNONAA >60 >60 >60 >60    LIVER FUNCTION TESTS: Recent Labs    10/23/20 1138 10/24/20 0448  BILITOT 1.0 0.6  AST 22 18  ALT 17 15  ALKPHOS 54 53  PROT 6.9 6.6  ALBUMIN 3.8 3.5  Assessment and Plan: L MCA infarct s/p arteriogram with stent-assisted angioplasty of L MCA with TICI 2C revascularization.  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  Patient resting comfortably.  Moving bilateral lower extremities spontaneously and to commands although weakness noted on R compared to left. Decreased movement with R arm/hand.  No hand grip.  PA to bedside alongside Dr. Karenann Cai who has explained imaging from procedure in full to the family.  Answered all questions from daughter  and husband.  Groin site stable.  Ok to bend and sit.  RN to remove quikclot dressing after noon.  IR available as needed.   Update 11:23 Repeat CT Head today shows: Extensive infarct throughout much of the left temporal lobe, posterior left frontal lobe, anterior left parietal lobe consistent with evolving left middle cerebral artery distribution infarct. This infarct includes portions of the left external capsule, claustrum, extreme capsule, and insular cortex. There is cytotoxic edema causing effacement of the left frontal horn and 6 mm of midline shift toward the right. Fourth ventricle midline. Discussed with Dr. Karenann Cai, no additional NIR intervention at this time.    Update 1520 PA to bedside alongside Dr. Karenann Cai per the request of Neuro NP to discuss status of the stent.  Extensive discussion amongst several participants including Dr. Karenann Cai, daughter, son, husband, and Dr. Leonie Man.  Impressions, as can be inferred, from the CT Head w/o contrast were relayed to the family and all questions were answered.    Electronically Signed: Docia Barrier, PA 10/27/2020, 1:43 PM   I spent a total of 35 Minutes at the the patient's bedside AND on the patient's hospital floor or unit, greater than 50% of which was counseling/coordinating care for L MCA stenosis, CVA.

## 2020-10-27 NOTE — Progress Notes (Signed)
PROGRESS NOTE  Brittany Griffith QIO:[id number] DOB: 1951/05/17 DOA: 10/23/2020 PCP: [facility] @ [place]   LOS: 3 days   Brief Narrative / Interim history: 70 year old female with hypertension, hyperlipidemia, osteopenia came in with right-sided weakness and speech difficulties.  Husband found the patient slumped in her chair, she was initially nonverbal but tracking with her eyes.  She also had facial drooping.  While initially symptoms resolved in the ER, later in the day recurred.  She underwent brain imaging which showed a meningioma for which neurosurgery was consulted.  Due to intermittent aphasia neurology consulted, underwent an MRI which showed acute stroke involving the left MCA in the left insula and parietal lobe.  Her neurological status got worse, and an MRI of the brain on 3/14 showed significant progression of the acute infarct in the left MCA territory, negative for hemorrhagic transformation.  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  She is status post left MCA angioplasty and stenting on 10/26/2020.  She was on heparin for 24 hours per protocol, now loaded with Brilinta.  Management per primary  Active Problems Hypertension-currently on clevidipine drip in the ICU, managed per neurology while in the neuro ICU.  Amlodipine added today  Hyperlipidemia-continue statin  Meningiomas-neurosurgery did not recommend any acute interventions  Disposition-currently in the ICU, has a Cortrack for feeding, once out of the ICU will mobilize with PT/speech therapy and see progression  Scheduled Meds: .  amLODipine  5 mg Per Tube Daily  . aspirin  81 mg Oral Daily   Or  . aspirin  81 mg Per Tube Daily  . atorvastatin  80 mg Per Tube Daily  . chlorhexidine  15 mL Mouth Rinse BID  . Chlorhexidine Gluconate Cloth  6 each Topical Daily  . mouth rinse  15 mL Mouth Rinse q12n4p  . senna-docusate  1 tablet Per Tube QHS  . ticagrelor  90 mg Oral BID   Or  . ticagrelor  90 mg Per Tube BID   Continuous Infusions: . sodium chloride Stopped (10/27/20 1210)  . clevidipine 9 mg/hr (10/27/20 1300)  . levETIRAcetam Stopped (10/27/20 0744)  . sodium chloride (hypertonic) 75 mL/hr at 10/27/20 1309   PRN Meds:.acetaminophen **OR** acetaminophen (TYLENOL) oral liquid 160 mg/5 mL **OR** acetaminophen, labetalol, polyethylene glycol  Diet Orders (From admission, onward)    Start     Ordered   10/26/20 1401  Diet NPO time specified  Diet effective now        10/26/20 1400          DVT prophylaxis: SCD's Start: 10/23/20 1955     Code Status: Full Code  Family Communication: no family at bedside   Status is: Inpatient  Remains inpatient appropriate because:Inpatient level of care appropriate due to severity of illness   Dispo:  Patient From: Home  Planned Disposition: Inpatient Rehab  Medically stable for discharge: No     Level of care: ICU  Consultants:  Neurology   Microbiology  None   Antimicrobials: None     Objective: Vitals:   10/27/20 1245 10/27/20 1300 10/27/20 1315 10/27/20 1330  BP:  (!) 123/49  140/61  Pulse: 68  66 64 65  Resp: 17 19 18 19   Temp:      TempSrc:      SpO2: 95% 95% 94% 95%  Weight:      Height:        Intake/Output Summary (Last 24 hours) at 10/27/2020 1344 Last data filed at 10/27/2020 1300 Gross per 24 hour  Intake 2407.36 ml  Output 2950 ml  Net -542.64 ml   Filed Weights   10/23/20 1126  Weight: 69.9 kg    Examination:  Constitutional: NAD Eyes: no scleral icterus ENMT: Mucous membranes are moist.  Neck: normal,  supple Respiratory: clear to auscultation bilaterally, no wheezing, no crackles. Normal respiratory effort.  Cardiovascular: Regular rate and rhythm, no murmurs / rubs / gallops. No LE edema.  Abdomen: non distended, no tenderness. Bowel sounds positive.  Musculoskeletal: no clubbing / cyanosis.  Skin: no rashes Neurologic: Following commands on left, weak on left   Data Reviewed: I have independently reviewed following labs and imaging studies   CBC: Recent Labs  Lab 10/23/20 1138 10/23/20 1206 10/24/20 0448 10/25/20 0224 10/26/20 0413 10/27/20 0624  WBC 6.8  --  6.5 7.6 6.4 10.8*  NEUTROABS  --   --  5.2  --   --  7.5  HGB 13.4 12.9 12.8 11.9* 12.3 10.9*  HCT 42.5 38.0 38.1 33.4* 35.8* 31.6*  MCV 90.8  --  85.4 84.1 84.6 83.6  PLT 213  --  220 202 209 371   Basic Metabolic Panel: Recent Labs  Lab 10/23/20 1138 10/23/20 1206 10/24/20 0448 10/25/20 0224 10/26/20 0413 10/27/20 0624  NA 139 141 137 140 140 139  K 3.6 3.5 3.7 3.5 3.4* 3.3*  CL 108 109 106 110 110 113*  CO2 21*  --  20* 23 22 19*  GLUCOSE 94 91 143* 99 107* 106*  BUN 11 12 14 10 10  7*  CREATININE 0.67 0.70 0.69 0.65 0.70 0.60  CALCIUM 9.2  --  9.2 8.8* 8.9 8.5*  MG  --   --  1.8 1.8  --   --   PHOS  --   --   --  3.1  --   --    Liver Function Tests: Recent Labs  Lab 10/23/20 1138 10/24/20 0448  AST 22 18  ALT 17 15  ALKPHOS 54 53  BILITOT 1.0 0.6  PROT 6.9 6.6  ALBUMIN 3.8 3.5   Coagulation Profile: Recent Labs  Lab 10/25/20 0224  INR 1.1   HbA1C: No results for input(s): HGBA1C in the last 72 hours. CBG: Recent Labs  Lab 10/23/20 1126 10/25/20 1126  GLUCAP 98 91    Recent Results (from the past 240 hour(s))  Resp Panel by RT-PCR (Flu A&B, Covid) Nasopharyngeal Swab     Status: None   Collection Time: 10/23/20 12:28 PM   Specimen: Nasopharyngeal Swab; Nasopharyngeal(NP) swabs in vial transport medium  Result Value Ref Range Status   SARS Coronavirus 2 by RT PCR NEGATIVE  NEGATIVE Final    Comment: (NOTE) SARS-CoV-2 target nucleic acids are NOT DETECTED.  The SARS-CoV-2 RNA is generally detectable in upper respiratory specimens during the acute phase of infection. The lowest concentration of SARS-CoV-2 viral copies this assay can detect is 138 copies/mL. A negative result does not preclude SARS-Cov-2 infection and should not be used as the sole basis for treatment or other patient management decisions. A negative result may occur with  improper specimen collection/handling, submission of specimen other than nasopharyngeal swab, presence of  viral mutation(s) within the areas targeted by this assay, and inadequate number of viral copies(<138 copies/mL). A negative result must be combined with clinical observations, patient history, and epidemiological information. The expected result is Negative.  Fact Sheet for Patients:  EntrepreneurPulse.com.au  Fact Sheet for Healthcare Providers:  IncredibleEmployment.be  This test is no t yet approved or cleared by the [place] FDA and  has been authorized for detection and/or diagnosis of SARS-CoV-2 by FDA under an Emergency Use Authorization (EUA). This EUA will remain  in effect (meaning this test can be used) for the duration of the COVID-19 declaration under Section 564(b)(1) of the Act, 21 U.S.C.section 360bbb-3(b)(1), unless the authorization is terminated  or revoked sooner.       Influenza A by PCR NEGATIVE NEGATIVE Final   Influenza B by PCR NEGATIVE NEGATIVE Final    Comment: (NOTE) The Xpert Xpress SARS-CoV-2/FLU/RSV plus assay is intended as an aid in the diagnosis of influenza from Nasopharyngeal swab specimens and should not be used as a sole basis for treatment. Nasal washings and aspirates are unacceptable for Xpert Xpress SARS-CoV-2/FLU/RSV testing.  Fact Sheet for Patients: EntrepreneurPulse.com.au  Fact Sheet for Healthcare  Providers: IncredibleEmployment.be  This test is not yet approved or cleared by the [place] FDA and has been authorized for detection and/or diagnosis of SARS-CoV-2 by FDA under an Emergency Use Authorization (EUA). This EUA will remain in effect (meaning this test can be used) for the duration of the COVID-19 declaration under Section 564(b)(1) of the Act, 21 U.S.C. section 360bbb-3(b)(1), unless the authorization is terminated or revoked.  Performed at [facility], [place] [address] [place], [place] [address]   Surgical pcr screen     Status: None   Collection Time: 10/26/20  2:56 AM   Specimen: Nasal Mucosa; Nasal Swab  Result Value Ref Range Status   MRSA, PCR NEGATIVE NEGATIVE Final   Staphylococcus aureus NEGATIVE NEGATIVE Final    Comment: (NOTE) The Xpert SA Assay (FDA approved for NASAL specimens in patients 34 years of age and older), is one component of a comprehensive surveillance program. It is not intended to diagnose infection nor to guide or monitor treatment. Performed at [facility], [place] [address] [place], [place] [address]      Radiology Studies: CT HEAD WO CONTRAST  Result Date: 10/27/2020 CLINICAL DATA:  Right upper extremity weakness EXAM: CT HEAD WITHOUT CONTRAST TECHNIQUE: Contiguous axial images were obtained from the base of the skull through the vertex without intravenous contrast. COMPARISON:  October 26, 2020 FINDINGS: Brain: Compared to 1 day prior, there is now cytotoxic edema and decreased attenuation throughout much of the anterior and mid left temporal lobe as well as portions of the anterior left parietal lobe and a portion of the posterior aspect of the left frontal lobe. This edema causes relative effacement of the left lateral ventricle. There is now approximately 6 mm of midline shift to the right. Infarct is again noted involving the left external capsule, claustrum, extreme capsule as well as insular cortex  anteriorly and midportion regions. Previous partially calcified meningioma in the medial left frontal region with extensive dural thickening/calcification in the left frontal region is stable consistent with meningioma. This appearance is stable. Calcification along the dura of the left anterior parietal lobe and anterior left temporal lobe is also stable. No new evident mass. No acute hemorrhage is evident. Vascular: There is a left middle cerebral artery hyperdense vessel consistent with occlusion of the  left middle cerebral artery. There is calcification in each carotid siphon region. Skull: Bony calvarium appears intact. Meningioma changes on the left as noted above. Sinuses/Orbits: Mucosal thickening noted in the maxillary antra bilaterally with small air-fluid levels. Mucosal thickening noted in several ethmoid air cells. Orbits appear symmetric bilaterally. Other: Mastoid air cells clear. IMPRESSION: 1. Hyperdense vessel left middle cerebral artery consistent with occlusion of this vessel. 2. Extensive infarct throughout much of the left temporal lobe, posterior left frontal lobe, anterior left parietal lobe consistent with evolving left middle cerebral artery distribution infarct. This infarct includes portions of the left external capsule, claustrum, extreme capsule, and insular cortex. There is cytotoxic edema causing effacement of the left frontal horn and 6 mm of midline shift toward the right. Fourth ventricle midline. 3. Left frontal meningioma with extensive dural base calcification on the left persists without change. 4.  Foci of arterial vascular calcification noted. 5.  Areas of paranasal sinus disease noted. These results will be called to the ordering clinician or representative by the Radiologist Assistant, and communication documented in the PACS or [employer]. Electronically Signed   By: Lowella Grip III M.D.   On: 10/27/2020 11:18    Marzetta Board, MD, PhD [facility]  Between 7 am - 7 pm I am available, please contact me via Amion or Securechat  Between 7 pm - 7 am I am not available, please contact night coverage MD/APP via Amion

## 2020-10-28 ENCOUNTER — Inpatient Hospital Stay (HOSPITAL_COMMUNITY): Payer: Medicare HMO

## 2020-10-28 DIAGNOSIS — G936 Cerebral edema: Secondary | ICD-10-CM

## 2020-10-28 LAB — BASIC METABOLIC PANEL
Anion gap: 8 (ref 5–15)
BUN: 10 mg/dL (ref 8–23)
CO2: 18 mmol/L — ABNORMAL LOW (ref 22–32)
Calcium: 8.7 mg/dL — ABNORMAL LOW (ref 8.9–10.3)
Chloride: 124 mmol/L — ABNORMAL HIGH (ref 98–111)
Creatinine, Ser: 0.61 mg/dL (ref 0.44–1.00)
GFR, Estimated: >60 mL/min (ref >60)
Glucose, Bld: 160 mg/dL — ABNORMAL HIGH (ref 70–99)
Potassium: 3.1 mmol/L — ABNORMAL LOW (ref 3.5–5.1)
Sodium: 150 mmol/L — ABNORMAL HIGH (ref 135–145)

## 2020-10-28 LAB — PHOSPHORUS: Phosphorus: 3.1 mg/dL (ref 2.5–4.6)

## 2020-10-28 LAB — GLUCOSE, CAPILLARY
Glucose-Capillary: 140 mg/dL — ABNORMAL HIGH (ref 70–99)
Glucose-Capillary: 213 mg/dL — ABNORMAL HIGH (ref 70–99)

## 2020-10-28 LAB — SODIUM
Sodium: 147 mmol/L — ABNORMAL HIGH (ref 135–145)
Sodium: 148 mmol/L — ABNORMAL HIGH (ref 135–145)
Sodium: 149 mmol/L — ABNORMAL HIGH (ref 135–145)

## 2020-10-28 LAB — MAGNESIUM: Magnesium: 1.8 mg/dL (ref 1.7–2.4)

## 2020-10-28 LAB — TRIGLYCERIDES: Triglycerides: 85 mg/dL (ref <150)

## 2020-10-28 MED ORDER — POTASSIUM CHLORIDE 20 MEQ PO PACK
40.0000 meq | PACK | ORAL | Status: AC
  Administered 2020-10-29 (×2): 40 meq
  Filled 2020-10-28 (×2): qty 2

## 2020-10-28 MED ORDER — PROSOURCE TF PO LIQD
45.0000 mL | Freq: Every day | ORAL | Status: DC
  Administered 2020-10-28 – 2020-10-31 (×4): 45 mL
  Filled 2020-10-28 (×4): qty 45

## 2020-10-28 MED ORDER — HYDRALAZINE HCL 20 MG/ML IJ SOLN: 20.0000 mg | Freq: Four times a day (QID) | INTRAMUSCULAR | Status: DC | PRN

## 2020-10-28 MED ORDER — RESOURCE THICKENUP CLEAR PO POWD
ORAL | Status: DC | PRN
  Filled 2020-10-28: qty 125

## 2020-10-28 MED ORDER — OSMOLITE 1.2 CAL PO LIQD
1000.0000 mL | ORAL | Status: DC
  Administered 2020-10-28 – 2020-10-29 (×2): 1000 mL
  Filled 2020-10-28 (×3): qty 1000

## 2020-10-28 MED ORDER — INSULIN ASPART 100 UNIT/ML ~~LOC~~ SOLN
0.0000 [IU] | SUBCUTANEOUS | Status: DC
  Administered 2020-10-29 (×2): 1 [IU] via SUBCUTANEOUS
  Administered 2020-10-29: 2 [IU] via SUBCUTANEOUS
  Administered 2020-10-29 (×2): 1 [IU] via SUBCUTANEOUS
  Administered 2020-10-29: 3 [IU] via SUBCUTANEOUS
  Administered 2020-10-29 – 2020-10-30 (×2): 1 [IU] via SUBCUTANEOUS
  Administered 2020-10-30: 0 [IU] via SUBCUTANEOUS
  Administered 2020-10-31 – 2020-11-01 (×3): 1 [IU] via SUBCUTANEOUS
  Administered 2020-11-01 (×3): 2 [IU] via SUBCUTANEOUS

## 2020-10-28 NOTE — Progress Notes (Signed)
OT Cancellation Note  Patient Details Name: Brittany Griffith MRN: [id number] DOB: [birth year]   Cancelled Treatment:    Reason Eval/Treat Not Completed: Patient at procedure or test/ unavailable (Swallow study. Will return as schedule allows.)  [place], OTR/L Acute Rehab Pager: [phone] Office: [phone] 10/28/2020, 1:19 PM

## 2020-10-28 NOTE — Discharge Instructions (Addendum)
Femoral Site Care This sheet gives you information about how to care for yourself after your procedure. Your health care provider may also give you more specific instructions. If you have problems or questions, contact your health care provider. What can I expect after the procedure? After the procedure, it is common to have:  Bruising that usually fades within 1-2 weeks.  Tenderness at the site. Follow these instructions at home: Wound care 1. Follow instructions from your health care provider about how to take care of your insertion site. Make sure you: ? Wash your hands with soap and water before you change your bandage (dressing). If soap and water are not available, use hand sanitizer. ? Change your dressing as directed- pressure dressing removed 24 hours post-procedure (and switch for bandaid), bandaid removed 72 hours post-procedure 2. Do not take baths, swim, or use a hot tub for 7 days post-procedure. 3. You may shower 48 hours after the procedure or as told by your health care provider. ? Gently wash the site with plain soap and water. ? Pat the area dry with a clean towel. ? Do not rub the site. This may cause bleeding. 4. Check your site every day for signs of infection. Check for: ? Redness, swelling, or pain. ? Fluid or blood. ? Warmth. ? Pus or a bad smell. Activity  Do not stoop, bend, or lift anything that is heavier than 10 lb (4.5 kg) for 2 weeks post-procedure.  Do not drive self for 2 weeks post-procedure. Contact a health care provider if you have:  A fever or chills.  You have redness, swelling, or pain around your insertion site. Get help right away if:  The catheter insertion area swells very fast.  You pass out.  You suddenly start to sweat or your skin gets clammy.  The catheter insertion area is bleeding, and the bleeding does not stop when you hold steady pressure on the area.  The area near or just beyond the catheter insertion site becomes  pale, cool, tingly, or numb. These symptoms may represent a serious problem that is an emergency. Do not wait to see if the symptoms will go away. Get medical help right away. Call your local emergency services (911 in the [place] Do not drive yourself to the hospital.  This information is not intended to replace advice given to you by your health care provider. Make sure you discuss any questions you have with your health care provider. Document Revised: 08/12/2017 Document Reviewed: 08/12/2017 Elsevier Patient Education  2020 Elsevier Inc. 

## 2020-10-28 NOTE — Progress Notes (Addendum)
Initial Nutrition Assessment  DOCUMENTATION CODES:   Not applicable  INTERVENTION:   Initiate tube feeding via Cortrak tube: Osmolite 1.2 at 60 ml/h (1440 ml per day) Prosource TF 45 ml daily  Provides 1768 kcal, 91 gm protein, 1167 ml free water daily  Encourage PO intake at meals  Adjust TF as PO intake improves   NUTRITION DIAGNOSIS:   Inadequate oral intake related to dysphagia as evidenced by meal completion < 50% (altered texture diet).  GOAL:   Patient will meet greater than or equal to 90% of their needs  MONITOR:   PO intake,TF tolerance  REASON FOR ASSESSMENT:   Consult Enteral/tube feeding initiation and management  ASSESSMENT:   Pt with PMH of HTN and HLD admitted with L MCA d/t severe L M1 stenosis s/p IR for angioplasty and stent.    Pt discussed during ICU rounds and with RN. Pt remains non-verbal. Pt and husband do not speak [place]. Daughter at bedside and speaks [place].   Spoke with daughter. She reports that pt has had no recent changes in her weight, good appetite PTA.   3/16 cortrak placed for medications; tip gastric  3/18 diet advanced to Dysphagia 1 with Honey thickened liquids  Medications reviewed and include: senokot-s Hypertonic saline  Labs reviewed:  CBG's: 147-149    NUTRITION - FOCUSED PHYSICAL EXAM:  Flowsheet Row Most Recent Value  Orbital Region No depletion  Upper Arm Region No depletion  Thoracic and Lumbar Region No depletion  Buccal Region No depletion  Temple Region No depletion  Clavicle Bone Region No depletion  Clavicle and Acromion Bone Region No depletion  Scapular Bone Region No depletion  Dorsal Hand No depletion  Patellar Region No depletion  Anterior Thigh Region No depletion  Posterior Calf Region No depletion  Edema (RD Assessment) None  Hair Reviewed  Eyes Unable to assess  Mouth Unable to assess  Skin Reviewed  Nails Reviewed       Diet Order:   Diet Order            DIET - DYS 1  Room service appropriate? No; Fluid consistency: Honey Thick  Diet effective now                 EDUCATION NEEDS:   No education needs have been identified at this time  Skin:  Skin Assessment: Reviewed RN Assessment  Last BM:  3/18  Height:   Ht Readings from Last 1 Encounters:  10/23/20 5\' 2"  (1.575 m)    Weight:   Wt Readings from Last 1 Encounters:  10/23/20 69.9 kg    Ideal Body Weight:  50 kg  BMI:  Body mass index is 28.17 kg/m.  Estimated Nutritional Needs:   Kcal:  1700-1900  Protein:  85-100 grams  Fluid:  > 1.7 L/day  Lockie Pares., RD, LDN, CNSC See AMiON for contact information

## 2020-10-28 NOTE — Progress Notes (Signed)
Brief Cortrak Note  Consult received to place Cortrak, though pt already had functioning Cortrak in place. RD retraced tube and flushed tube to ensure it is functioning properly. Note, RD did pull Cortrak back by 5cm as it was likely right at the pylorus at 65cm (where it was previously left on 3/16). Cortrak bridled in L nare at 60cm.  LDAs updated.  No x-ray is required. RN may begin using tube.   If the tube becomes dislodged please keep the tube and contact the Cortrak team at [email or web address] ([email or web address]) for replacement.  If after hours and replacement cannot be delayed, place a NG tube and confirm placement with an abdominal x-ray.    Larkin Ina, MS, RD, LDN RD pager number and weekend/on-call pager number located in [place].

## 2020-10-28 NOTE — Progress Notes (Signed)
STROKE TEAM PROGRESS NOTE   INTERVAL HISTORY Daughter (English speaking) and husband at bedside.    S/p Left MCA angioplasty 3/16  by Dr. Jaquita Folds.  RUE with worsening weakness yesterday, Head CT repeated showing extension of f Left MCA infarct and cerebral edema with midline shift. HTS started.  RUE w/severe weakness this morning. Able to follow some simple commands. Remains aphasic.  BP adequately controlled daily po Norvasc added. Prn hydralazine on board.  Stroke diagnosis, plan of care and ongoing work up discussed with family at the bedside. Questions answered.   Vitals:   10/28/20 1000 10/28/20 1200 10/28/20 1300 10/28/20 1400  BP: (!) 182/69 (!) 189/75 (!) 156/65 (!) 166/61  Pulse: 71 72 65 68  Resp: 19 14 20 18   Temp:  97.8 F (36.6 C)    TempSrc:  Oral    SpO2: 99% 100% 98% 96%  Weight:      Height:       CBC:  Recent Labs  Lab 10/24/20 0448 10/25/20 0224 10/26/20 0413 10/27/20 0624  WBC 6.5   < > 6.4 10.8*  NEUTROABS 5.2  --   --  7.5  HGB 12.8   < > 12.3 10.9*  HCT 38.1   < > 35.8* 31.6*  MCV 85.4   < > 84.6 83.6  PLT 220   < > 209 202   < > = values in this interval not displayed.   Basic Metabolic Panel:  Recent Labs  Lab 10/24/20 0448 10/25/20 0224 10/26/20 0413 10/27/20 0624 10/27/20 1306 10/28/20 0603 10/28/20 1240  NA 137 140 140 139   < > 148* 149*  K 3.7 3.5 3.4* 3.3*  --   --   --   CL 106 110 110 113*  --   --   --   CO2 20* 23 22 19*  --   --   --   GLUCOSE 143* 99 107* 106*  --   --   --   BUN 14 10 10  7*  --   --   --   CREATININE 0.69 0.65 0.70 0.60  --   --   --   CALCIUM 9.2 8.8* 8.9 8.5*  --   --   --   MG 1.8 1.8  --   --   --   --   --   PHOS  --  3.1  --   --   --   --   --    < > = values in this interval not displayed.   Lipid Panel:  Recent Labs  Lab 10/24/20 0448 10/28/20 0206  CHOL 248*  --   TRIG 58 85  HDL 77  --   CHOLHDL 3.2  --   VLDL 12  --   LDLCALC 159*  --    HgbA1c:  Recent Labs  Lab  10/24/20 0448  HGBA1C 5.6   Urine Drug Screen:  Recent Labs  Lab 10/23/20 1430  LABOPIA NONE DETECTED  COCAINSCRNUR NONE DETECTED  LABBENZ NONE DETECTED  AMPHETMU NONE DETECTED  THCU NONE DETECTED  LABBARB NONE DETECTED    IMAGING past 24 hours  MRI BRAIN WO 10/24/2020 Acute infarct in the left MCA territory with significant progression since yesterday. This involves the insula and deep white matter with small areas of cortical infarct. 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 Extensive hyperostosis in the left  frontal bone. No associated brain edema.  CT HEAD WO CONTRAST  Result Date: 10/24/2020 1. Appearance suspicious for new Left PCA territory ischemia in the occipital pole. However, this might be streak artifact related to the complex left skull lesion.  2. Cytotoxic edema now evident at the posterior left insula and mildly larger than the DWI lesion yesterday.  3. Stable brain otherwise. No acute hemorrhage or increased intracranial mass effect. 4. Stable complex left frontal convexity meningioma(s) and skull hyperostosis.   MR BRAIN WO CONTRAST  Result Date: 10/24/2020 IMPRESSION: Acute infarct in the left MCA territory with significant progression since yesterday. This involves the insula and deep white matter with small areas of cortical infarct. 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 No associated brain edema.   MR Brain W and Wo Contrast  Result Date: 10/23/2020 IMPRESSION:  1. 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  2. 2.6 cm enhancing left frontal parafalcine mass consistent with a meningioma with mild edema and minimal rightward  midline shift. This is associated with extensive dural ossification and left frontal skull hyperostosis along the anterior and lateral cerebral convexities. 3. Smaller meningiomas along the falx in the posterior left frontal region and laterally in the left sylvian fissure.  4. 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  CTA HEAD/NECK IMPRESSION: 1. 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 Mild edema in the left frontal lobe. Trace local rightward midline shift. Brain MRI without and with contrast is recommended for further evaluation. 2. No evidence of an acute infarct or intracranial hemorrhage. 3. No large vessel occlusion. 4. Intracranial atherosclerosis including moderate right and severe left M1 stenoses and moderate bilateral P2 stenoses. 5. Widely patent cervical carotid and vertebral arteries. 6. 2.3 cm right thyroid nodule. Recommend thyroid US (ref: J Am Coll  EEG adult Result Date: 10/23/2020 IMPRESSION: This study is suggestive of cortical dysfunction in left temporal region which is non specific but could be secondary to underlying structural abnormality. No seizures or epileptiform discharges were seen throughout the recording. [name]   CTA head Result date 10/25/2020 Evolving recent left MCA territory infarctions. No acute intracranial hemorrhage or significant mass effect.  Similar appearance of intracranial atherosclerosis compared to recent prior study.  PHYSICAL EXAM Physical Exam  HEENT-  Skull asymmetry due to hyperostosis. No trauma seen.    Lungs- Respirations unlabored Extremities- No edema  Neurological Examination  Mental Status:   Patient is sleepy.  She partially opens eyes to stimulation.  Unable to follow most verbal commands - will follow one step commands only. Speech output was sparse and mostly unintelligible,  . Does not answer  any questions but appears to be making an effort to answer and nodding appropriately   Cranial Nerves: II: Visual fields cannot be formally tested   III,IV, VI: EOMI noncooperative  V,VII: Unable to formally touch temperature sensation, but reacts to touch bilaterally. Right facial droop is prominent when smiling.  VIII: Hearing intact to some commands.  IX,X: Unable to assess. Not following commands for mouth opening.  XI: Head is midline XII: Did not protrude tongue.  Motor: RUE 1/5 grip, biceps, triceps and deltoid. Drift present LUE 4+/5 grip, biceps, triceps and deltoid. RLE 4/5 hip and knee flexion LLE 5/5 hip and  knee flexion Sensory: Reacts to touch x 4 but does not answer questions regarding the intensity of the touch or temperature stimuli Cerebellar: ataxia on FN bilaterally Gait: Deferred due to falls risk concerns   ASSESSMENT/PLAN Ms. Tmya Wigington is a 70 y.o. female with history of hypertension who was found sitting on the floor slumped over, nonverbal and unable to follow commands, but awake, tracking with her eyes and crying.  Husband then lifted his wife off the floor and noticed right-sided weakness and a right-sided facial droop. LKN was 0930. She continued to exhibit the same symptoms on EMS arrival to the home, with resolution approximately 20 minutes later.  MRI revealed small strokes scattered in the left MCA territory in the context of severe left M1 stenosis seen on CTA. Of note, she also has multiple left hemisphere meningiomas, the largest being associated with mild edema and minimal rightward shift.  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  S/p left MCA angioplasty and stenting 10/26/2020  With infarct extension and cytotoxic edema on repeat imaging 10/27/20  -Admission CT head: left PCA territory ischemia   -Repeat HCT 3/17 due RUE weakness: re-occlusion of Left MCA. Extensive infarct throughout much of the left temporal  lobe,posterior left frontal lobe, anterior left parietal lobe consistentwith evolving left middle cerebral artery distribution infarct. Thisinfarct includes portions of the left external capsule, claustrum,extreme capsule, and insular cortex. There is cytotoxic edema causing effacement of the left frontal horn and 6 mm of midline shift toward the right. Fourth ventricle midline. -AdmissionCTA head & neck: left frontal parafalcine mass with extensive regional hyperostosis which may reflect an en plaque meningioma. Mild edema in the left frontal lobe. Trace local rightward midline shift. Intracranial atherosclerosis including moderate right and severe left M1 stenoses and moderate bilateral P2 stenoses -MRI brain w/o: acute infarct in the left MCA territory with significant progression since yesterday. This involves the insula and deep white matter with small areas of cortical infarct. Negative for hemorrhagic transformation -MRA: ubcentimeter acute left MCA infarcts in the left insula and parietal lobe.  2. 2.6 cm enhancing left frontal parafalcine mass  consistent with a meningioma with mild edema and minimal  rightward midline shift. Smaller meningiomas along the falx in the       posterior left frontal region and laterally in the left sylvian fissure. -Repeat CTA head (3/15): Evolving recent left MCA territory infarctions. Stable appearance of left greater than right M1 stenoses. Distal MCA branch atherosclerotic irregularity is unchanged  2D Echo pending  LDL 159  HgbA1c 5.6  VTE prophylaxis - SCDs  Heart Healthy diet  Swallow eval pending today   No antithrombotic prior to admission. On ASA 81mg        daily and Brilinta 90 mg BID   Therapy recommendations: CIR  Disposition:  pending  Meningiomas -Neurosurgery consulted: No acute neurosurgical intervention indicated  Left M1 stenosis -Dr. Estanislado Pandy consulted, patient taken to IR 3/16 where she underwent cerebral arteriogram with  successful revascularization/stenting of Left MCA -follow up head CT 3/17 showing re-occlusion of Left MCA -On heparin gtt overnight -Radiology called twice overnight for oozing at R groin site  Concern for partial complex seizures -Brief episode of aphasia and right sided weakness at home -EEG: cortical dysfunction in left temporal region which is non specific but could be secondary to underlying structural abnormality. No seizures or epileptiform discharges were seen throughout the recording.  Cerebral Edema with midline shift -HTS protocol initiated at  75cc/hr  -Monitor sodium q 6 hours per protocol -Na 139->148 -Continue Neuro ICU care  Dysphagia -SLP mod barium today with Dysphagia I diet and honey thick liquids with alternative means for medications recommended -Cortrak placement pending -Monitor hydration status with daily BMP  Hypertension -Home meds:  None -Remains hypertensive, requiring prn IV antihypertensives  Norvasc 5mg  daily added 2/54 -Keep systolic BP less than 982, Avoid hypotension  -Long-term BP goal normotensive  Hyperlipidemia -Home meds:  none -LDL 159, goal < 70 -Atorvastatin 80mg  daily -Continue statin at discharge  Other Stroke Risk Factors -Advanced Age >/= 19   Other Active Problems -2.3 cm right thyroid nodule. Recommend PCP followup and thyroid US -Left frontal meningioma likely asymptomatic and longstanding can be followed conservatively and no need for neurosurgical treatment at the present time.  I have personally obtained history,examined this patient, reviewed notes, independently viewed imaging studies, participated in medical decision making and plan of care.ROS completed by me personally and pertinent positives fully documented  I have made any additions or clarifications directly to the above note. Agree with note above.  Continue hypertonic saline with serum sodium goal 150-155.  Change NG tube to core track tube for tube feeding.   Speech therapy swallow eval.  Mobilize out of bed and therapy consults.  Discussion with patient's husband and daughter at the bedside and answered questions. This patient is critically ill and at significant risk of neurological worsening, death and care requires constant monitoring of vital signs, hemodynamics,respiratory and cardiac monitoring, extensive review of multiple databases, frequent neurological assessment, discussion with family, other specialists and medical decision making of high complexity.I have made any additions or clarifications directly to the above note.This critical care time does not reflect procedure time, or teaching time or supervisory time of PA/NP/Med Resident etc but could involve care discussion time.  I spent 30 minutes of neurocritical care time  in the care of  this patient.     Antony Contras, MD Medical Director [facility] Pager: [phone] 10/28/2020 3:20 PM    To contact Stroke Continuity provider, please refer to [email or web address] After hours, contact General Neurology

## 2020-10-28 NOTE — Progress Notes (Addendum)
Occupational Therapy Re-evaluation Patient Details Name: Brittany Griffith MRN: [id number] DOB: 08/21/1950 Today's Date: 10/28/2020    History of present illness 70 y.o. female who presented to hospital with complaints of speech difficulty and right sided weakness but on EMS arrival symptoms had completely resolved. In ER later in the day, MD reported patient started to have difficulty with getting her words out. MRI revealed acute CVA involving L MCA in the L insula and parietal lobe. Abnormal neuro checks in AM (3/14); repeat CT repeated showing extension of f Left MCA infarct and cerebral edema with midline shift. S/p Left MCA angioplasty 3/16  by Dr. Jaquita Folds. PMH: HLD, HTN, osteopenia   OT comments  Pt seen for re-eval after s/p Left MCA angioplasty 3/16. Pt presenting with high motivation to participate in therapy and continue working despite fatigue. Pt performing functional mobility (x2) in room with Max A +2 and assistance for blocking L knee. Pt participating in self feeding task with Mod A for incorporating RUE. Continue to highly recommend dc to CIR and will continue to follow acutely as admitted.    Follow Up Recommendations  CIR;Supervision/Assistance - 24 hour    Equipment Recommendations  Other (comment) (TBd at next venue of care)    Recommendations for Other Services Rehab consult;Speech consult;PT consult    Precautions / Restrictions Precautions Precautions: Fall       Mobility Bed Mobility Overal bed mobility: Needs Assistance Bed Mobility: Rolling;Sidelying to Sit Rolling: Mod assist;+2 for physical assistance Sidelying to sit: Mod assist       General bed mobility comments: Mod A to maintain RLE in flexed position and then push to roll L. Mod A to bring BLEs over EOB and then elevate trunk.    Transfers Overall transfer level: Needs assistance Equipment used: 1 person hand held assist;Rolling walker (2 wheeled) Transfers: Sit to/from Stand Sit to Stand:  Min assist;+2 physical assistance;+2 safety/equipment         General transfer comment: Min A +2 to power up into standing and maintain balance    Balance Overall balance assessment: Needs assistance Sitting-balance support: No upper extremity supported;Feet supported Sitting balance-Leahy Scale: Fair Sitting balance - Comments: Maintaining static sitting   Standing balance support: No upper extremity supported Standing balance-Leahy Scale: Poor Standing balance comment: Reliant on physical A                           ADL either performed or assessed with clinical judgement   ADL Overall ADL's : Needs assistance/impaired Eating/Feeding: Moderate assistance;Sitting Eating/Feeding Details (indicate cue type and reason): Mod A for bilateral coorindation due to decreased functional use of RUE. pt using spoon to scoop ice cream and bring to mouth                     Toilet Transfer: +2 for physical assistance;RW;Minimal assistance;Ambulation (simualted to recliner)           Functional mobility during ADLs: Moderate assistance;+2 for physical assistance;+2 for safety/equipment;Cueing for sequencing;Maximal assistance General ADL Comments: Pt demonsatrating high motivation. Performing functional mobility with Mod-Max A +2 and then participating in self feeding while sitting in recliner     Vision       Perception     Praxis      Cognition Arousal/Alertness: Awake/alert Behavior During Therapy: Flat affect (smiling with therapist) Overall Cognitive Status: Difficult to assess Area of Impairment: Attention;Problem solving;Awareness;Following commands;Safety/judgement  Current Attention Level: Sustained   Following Commands: Follows one step commands inconsistently;Follows one step commands with increased time Safety/Judgement: Decreased awareness of deficits;Decreased awareness of safety Awareness: Intellectual Problem Solving:  Slow processing;Decreased initiation;Difficulty sequencing;Requires verbal cues;Requires tactile cues General Comments: Difficult to assess due to decreased verbalization and communication. Pt following simple, one step commands ~80% of time. Very motiavted to particiapte in therapy. Requiring increased time        Exercises     Shoulder Instructions       General Comments VSS on RA    Pertinent Vitals/ Pain       Pain Assessment: Faces Faces Pain Scale: Hurts a little bit Pain Location: Generalized; unable to state Pain Descriptors / Indicators: Discomfort Pain Intervention(s): Monitored during session;Limited activity within patient's tolerance;Repositioned  Home Living                                          Prior Functioning/Environment              Frequency  Min 3X/week        Progress Toward Goals  OT Goals(current goals can now be found in the care plan section)  Progress towards OT goals: Progressing toward goals  Acute Rehab OT Goals Patient Stated Goal: pt did not state Time For Goal Achievement: 11/08/20 Potential to Achieve Goals: Good ADL Goals Pt Will Perform Grooming: sitting;with min assist Pt Will Perform Upper Body Bathing: with min assist;sitting Pt Will Transfer to Toilet: with min assist;bedside commode Pt/caregiver will Perform Home Exercise Program: Increased strength;Increased ROM;Right Upper extremity Additional ADL Goal #1: Pt will follow 1 step commands with 75% accuracy to optimize participation in ADLs.  Plan Discharge plan remains appropriate;Frequency remains appropriate    Co-evaluation    PT/OT/SLP Co-Evaluation/Treatment: Yes Reason for Co-Treatment: For patient/therapist safety;To address functional/ADL transfers   OT goals addressed during session: ADL's and self-care      AM-PAC OT "6 Clicks" Daily Activity     Outcome Measure   Help from another person eating meals?: A Lot Help from another  person taking care of personal grooming?: A Lot Help from another person toileting, which includes using toliet, bedpan, or urinal?: A Lot Help from another person bathing (including washing, rinsing, drying)?: A Lot Help from another person to put on and taking off regular upper body clothing?: A Lot Help from another person to put on and taking off regular lower body clothing?: A Lot 6 Click Score: 12    End of Session Equipment Utilized During Treatment: Gait belt  OT Visit Diagnosis: Other abnormalities of gait and mobility (R26.89);Muscle weakness (generalized) (M62.81);Other symptoms and signs involving the nervous system (R29.898);Other symptoms and signs involving cognitive function;Cognitive communication deficit (R41.841) Symptoms and signs involving cognitive functions: Cerebral infarction   Activity Tolerance Patient tolerated treatment well   Patient Left in chair;with call bell/phone within reach;with chair alarm set   Nurse Communication Mobility status        Time: 5701-7793 OT Time Calculation (min): 25 min  Charges: OT General Charges $OT Visit: 1 Visit OT Evaluation $OT Re-eval: 1 Re-eval  [place], OTR/L Acute Rehab Pager: [phone] Office: [place] 10/28/2020, 5:05 PM

## 2020-10-28 NOTE — Progress Notes (Signed)
Referring Physician(s): Kerney Elbe (neurology)  Supervising Physician: Pedro Earls  Patient Status:  [facility] - In-pt  Chief Complaint: Left MCA stenosis s/p stent assisted angioplasty follow-up  Subjective:  Acute CVA (left MCA territory infarct) in setting of left MCA stenosis s/p cerebral arteriogram with revascularization of left MCA stenosis using stent assisted angioplasty via right femoral approach 10/26/2020 by Dr. Estanislado Pandy. Patient awake and alert laying in bed. Husband and daughter at bedside. Aphasic but follows simple commands. Movement of all extremities (right side- can lift LE but cannot hold, minimal hand grip of UE). Right femoral puncture site c/d/i.   Allergies: Penicillins  Medications: Prior to Admission medications   Medication Sig Start Date End Date Taking? Authorizing Provider  Multiple Vitamins-Minerals (MULTIVITAMIN WITH MINERALS) tablet Take 1 tablet by mouth daily.   Yes [provider]     Vital Signs: BP (!) 185/75   Pulse 69   Temp 99.3 F (37.4 C) (Oral)   Resp 13   Ht 5\' 2"  (1.575 m)   Wt 154 lb (69.9 kg)   SpO2 99%   BMI 28.17 kg/m   Physical Exam Vitals and nursing note reviewed.  Constitutional:      General: She is not in acute distress. Pulmonary:     Effort: Pulmonary effort is normal. No respiratory distress.  Skin:    General: Skin is warm and dry.     Comments: Right femoral puncture site soft without active bleeding or hematoma.  Neurological:     Mental Status: She is alert.     Comments: Alert and awake. Aphasic but follows simple commands. PERRL bilaterally. Moving left side spontaneously, can lift RLE off bed but cannot hold, minimal right hand grip. Distal pulses (DPs) 1+ bilaterally.     Imaging: CT HEAD WO CONTRAST  Addendum Date: 10/27/2020   ADDENDUM REPORT: 10/27/2020 15:45 ADDENDUM: Comment: The increased attenuation in the left middle cerebral artery is actually due to  a stent in this vessel. Electronically Signed   By: Lowella Grip III M.D.   On: 10/27/2020 15:45   Result Date: 10/27/2020 CLINICAL DATA:  Right upper extremity weakness EXAM: CT HEAD WITHOUT CONTRAST TECHNIQUE: Contiguous axial images were obtained from the base of the skull through the vertex without intravenous contrast. COMPARISON:  October 26, 2020 FINDINGS: Brain: Compared to 1 day prior, there is now cytotoxic edema and decreased attenuation throughout much of the anterior and mid left temporal lobe as well as portions of the anterior left parietal lobe and a portion of the posterior aspect of the left frontal lobe. This edema causes relative effacement of the left lateral ventricle. There is now approximately 6 mm of midline shift to the right. Infarct is again noted involving the left external capsule, claustrum, extreme capsule as well as insular cortex anteriorly and midportion regions. Previous partially calcified meningioma in the medial left frontal region with extensive dural thickening/calcification in the left frontal region is stable consistent with meningioma. This appearance is stable. Calcification along the dura of the left anterior parietal lobe and anterior left temporal lobe is also stable. No new evident mass. No acute hemorrhage is evident. Vascular: There is a left middle cerebral artery hyperdense vessel consistent with occlusion of the left middle cerebral artery. There is calcification in each carotid siphon region. Skull: Bony calvarium appears intact. Meningioma changes on the left as noted above. Sinuses/Orbits: Mucosal thickening noted in the maxillary antra bilaterally with small air-fluid levels. Mucosal thickening noted  in several ethmoid air cells. Orbits appear symmetric bilaterally. Other: Mastoid air cells clear. IMPRESSION: 1. Hyperdense vessel left middle cerebral artery consistent with occlusion of this vessel. 2. Extensive infarct throughout much of the left  temporal lobe, posterior left frontal lobe, anterior left parietal lobe consistent with evolving left middle cerebral artery distribution infarct. This infarct includes portions of the left external capsule, claustrum, extreme capsule, and insular cortex. There is cytotoxic edema causing effacement of the left frontal horn and 6 mm of midline shift toward the right. Fourth ventricle midline. 3. Left frontal meningioma with extensive dural base calcification on the left persists without change. 4.  Foci of arterial vascular calcification noted. 5.  Areas of paranasal sinus disease noted. These results will be called to the ordering clinician or representative by the Radiologist Assistant, and communication documented in the PACS or [employer]. Electronically Signed: By: Lowella Grip III M.D. On: 10/27/2020 11:18   CT HEAD WO CONTRAST  Result Date: 10/26/2020 CLINICAL DATA:  Mental status change.  Lethargy.  Stroke. EXAM: CT HEAD WITHOUT CONTRAST TECHNIQUE: Contiguous axial images were obtained from the base of the skull through the vertex without intravenous contrast. COMPARISON:  MRI head 10/24/2020.  CT head 10/25/2020 FINDINGS: Brain: Evolving infarct left MCA territory. Hypodensity in the deep white matter on the left. Hypodensity in the left insula. These areas show restricted diffusion on recent MRI and are consistent with stable evolving infarct. No new infarct or hemorrhage. Chronic infarct right cerebellum unchanged. Ventricle size normal.  No midline shift. Extensive extra-axial dural base calcification in the left frontal parietal lobe compatible with meningioma, stable from the prior study. Mild associated edema in the left frontal white matter. Vascular: Negative for hyperdense vessel Skull: Thickening of the left frontal bone compatible with hyperostosis related to meningioma, stable Sinuses/Orbits: Mucosal edema and air-fluid levels in the maxillary sinus bilaterally. Negative orbit  Other: None IMPRESSION: Evolving subacute infarcts in the left MCA territory, consistent with recent MRI. No new infarct or hemorrhage. Extensive dural ossification compatible with meningioma on the left. Electronically Signed   By: Franchot Gallo M.D.   On: 10/26/2020 10:10   ECHOCARDIOGRAM COMPLETE  Result Date: 10/24/2020    ECHOCARDIOGRAM REPORT   Patient Name:   Brittany Griffith Date of Exam: 10/24/2020 Medical Rec #:  [id number]     Height:       62.0 in Accession #:    [id number]    Weight:       154.0 lb Date of Birth:  [birth year]    BSA:          28.711 m Patient Age:    70 years      BP:           171/60 mmHg Patient Gender: F             HR:           62 bpm. Exam Location:  Inpatient Procedure: 2D Echo, Cardiac Doppler, Color Doppler and Intracardiac            Opacification Agent Indications:    Stroke  History:        Patient has no prior history of Echocardiogram examinations.                 Risk Factors:Hypertension and Dyslipidemia.  Sonographer:    Clayton Lefort RDCS (AE) Referring Phys: [id number] [place]  1. Left ventricular ejection fraction, by estimation, is 60 to 65%. The  left ventricle has normal function. The left ventricle has no regional wall motion abnormalities. Left ventricular diastolic parameters are indeterminate.  2. Right ventricular systolic function is normal. The right ventricular size is normal. There is normal pulmonary artery systolic pressure.  3. Right atrial size was mildly dilated.  4. The mitral valve is normal in structure. Trivial mitral valve regurgitation.  5. The aortic valve is normal in structure. Aortic valve regurgitation is mild. No aortic stenosis is present. FINDINGS  Left Ventricle: Left ventricular ejection fraction, by estimation, is 60 to 65%. The left ventricle has normal function. The left ventricle has no regional wall motion abnormalities. Definity contrast agent was given IV to delineate the left ventricular  endocardial borders. The  left ventricular internal cavity size was normal in size. There is borderline left ventricular hypertrophy. Left ventricular diastolic parameters are indeterminate. Right Ventricle: The right ventricular size is normal. No increase in right ventricular wall thickness. Right ventricular systolic function is normal. There is normal pulmonary artery systolic pressure. The tricuspid regurgitant velocity is 2.29 m/s, and  with an assumed right atrial pressure of 3 mmHg, the estimated right ventricular systolic pressure is 42.3 mmHg. Left Atrium: Left atrial size was normal in size. Right Atrium: Right atrial size was mildly dilated. Pericardium: There is no evidence of pericardial effusion. Mitral Valve: The mitral valve is normal in structure. Trivial mitral valve regurgitation. MV peak gradient, 2.9 mmHg. The mean mitral valve gradient is 1.0 mmHg. Tricuspid Valve: The tricuspid valve is grossly normal. Tricuspid valve regurgitation is trivial. Aortic Valve: The aortic valve is normal in structure. Aortic valve regurgitation is mild. No aortic stenosis is present. Aortic valve mean gradient measures 3.0 mmHg. Aortic valve peak gradient measures 5.1 mmHg. Aortic valve area, by VTI measures 1.94 cm. Pulmonic Valve: The pulmonic valve was normal in structure. Pulmonic valve regurgitation is not visualized. Aorta: The aortic root and ascending aorta are structurally normal, with no evidence of dilitation. IAS/Shunts: The atrial septum is grossly normal.  LEFT VENTRICLE PLAX 2D LVIDd:         4.10 cm  Diastology LVIDs:         2.90 cm  LV e' medial:    5.87 cm/s LV PW:         1.30 cm  LV E/e' medial:  11.0 LV IVS:        1.10 cm  LV e' lateral:   6.85 cm/s LVOT diam:     1.70 cm  LV E/e' lateral: 9.4 LV SV:         55 LV SV Index:   32 LVOT Area:     2.27 cm  RIGHT VENTRICLE             IVC RV Basal diam:  2.80 cm     IVC diam: 1.00 cm RV S prime:     10.60 cm/s TAPSE (M-mode): 2.7 cm LEFT ATRIUM             Index        RIGHT ATRIUM           Index LA diam:        2.80 cm 1.64 cm/m  RA Area:     19.20 cm LA Vol (A2C):   25.7 ml 15.02 ml/m RA Volume:   56.90 ml  33.26 ml/m LA Vol (A4C):   43.4 ml 25.37 ml/m LA Biplane Vol: 33.8 ml 19.76 ml/m  AORTIC VALVE AV Area (Vmax):  2.09 cm AV Area (Vmean):   1.88 cm AV Area (VTI):     1.94 cm AV Vmax:           113.00 cm/s AV Vmean:          81.600 cm/s AV VTI:            0.282 m AV Peak Grad:      5.1 mmHg AV Mean Grad:      3.0 mmHg LVOT Vmax:         104.00 cm/s LVOT Vmean:        67.600 cm/s LVOT VTI:          0.241 m LVOT/AV VTI ratio: 0.85  AORTA Ao Root diam: 3.30 cm Ao Asc diam:  3.00 cm MITRAL VALVE               TRICUSPID VALVE MV Area (PHT): 2.87 cm    TR Peak grad:   21.0 mmHg MV Area VTI:   1.63 cm    TR Vmax:        229.00 cm/s MV Peak grad:  2.9 mmHg MV Mean grad:  1.0 mmHg    SHUNTS MV Vmax:       0.85 m/s    Systemic VTI:  0.24 m MV Vmean:      41.9 cm/s   Systemic Diam: 1.70 cm MV Decel Time: 264 msec MV E velocity: 64.70 cm/s MV A velocity: 82.30 cm/s MV E/A ratio:  0.79 Mertie Moores MD Electronically signed by Mertie Moores MD Signature Date/Time: 10/24/2020/3:36:07 PM    Final    CT ANGIO HEAD CODE STROKE  Result Date: 10/25/2020 CLINICAL DATA:  Left MCA stroke, follow-up EXAM: CT ANGIOGRAPHY HEAD TECHNIQUE: Multidetector CT imaging of the head was performed using the standard protocol during bolus administration of intravenous contrast. Multiplanar CT image reconstructions and MIPs were obtained to evaluate the vascular anatomy. CONTRAST:  54mL OMNIPAQUE IOHEXOL 350 MG/ML SOLN COMPARISON:  None. FINDINGS: CT HEAD Brain: There are increased foci of hypoattenuation in the left MCA territory corresponding to areas of acute infarction seen on prior MRI. There is no acute intracranial hemorrhage or significant mass effect. Additional patchy hypoattenuation in the supratentorial white matter likely reflects stable chronic microvascular ischemic changes. There  is a small chronic right cerebellar infarct. Ventricles are stable in size. Stable appearance of calcified left parafalcine meningioma with extensive adjacent hyperostosis. Vascular: No new abnormality. Skull: Calvarium is unremarkable apart from hyperostosis. Sinuses/Orbits: No acute finding. Other: None. CTA HEAD There is greater artifact on this study as compared to recent prior. Anterior circulation: Intracranial internal carotid arteries are patent. Mixed plaque results in moderate stenosis the paraclinoid portions. Anterior and middle cerebral arteries are patent. Stable appearance of left greater than right M1 stenoses. Additional more distal MCA branch atherosclerotic irregularity is unchanged. Anterior cerebral arteries are patent. Middle and anterior cerebral arteries are patent. Posterior circulation: Intracranial vertebral, basilar, and posterior cerebral arteries are patent. Atherosclerotic irregularity of the posterior cerebral arteries with bilateral P2 segment stenoses again identified. Venous sinuses: Patent as permitted by contrast timing. Review of the MIP images confirms the above findings IMPRESSION: Evolving recent left MCA territory infarctions. No acute intracranial hemorrhage or significant mass effect. Similar appearance of intracranial atherosclerosis compared to recent prior study. Electronically Signed   By: Macy Mis M.D.   On: 10/25/2020 08:47    Labs:  CBC: Recent Labs    10/24/20 0448 10/25/20 0224 10/26/20 0413 10/27/20 2595  WBC 6.5 7.6 6.4 10.8*  HGB 12.8 11.9* 12.3 10.9*  HCT 38.1 33.4* 35.8* 31.6*  PLT 220 202 209 202    COAGS: Recent Labs    10/25/20 0224 10/26/20 1430  INR 1.1  --   APTT  --  >200*    BMP: Recent Labs    10/24/20 0448 10/25/20 0224 10/26/20 0413 10/27/20 0624 10/27/20 1306 10/27/20 2011 10/28/20 0214 10/28/20 0603  NA 137 140 140 139 139 143 147* 148*  K 3.7 3.5 3.4* 3.3*  --   --   --   --   CL 106 110 110 113*  --    --   --   --   CO2 20* 23 22 19*  --   --   --   --   GLUCOSE 143* 99 107* 106*  --   --   --   --   BUN 14 10 10  7*  --   --   --   --   CALCIUM 9.2 8.8* 8.9 8.5*  --   --   --   --   CREATININE 0.69 0.65 0.70 0.60  --   --   --   --   GFRNONAA >60 >60 >60 >60  --   --   --   --     LIVER FUNCTION TESTS: Recent Labs    10/23/20 1138 10/24/20 0448  BILITOT 1.0 0.6  AST 22 18  ALT 17 15  ALKPHOS 89 53  PROT 6.9 6.6  ALBUMIN 3.8 3.5    Assessment and Plan:  Acute CVA (left MCA territory infarct) in setting of left MCA stenosis s/p cerebral arteriogram with revascularization of left MCA stenosis using stent assisted angioplasty via right femoral approach 10/26/2020 by Dr. Estanislado Pandy. Patient's condition stable- awake and alert, aphasic but follows simple commands, moving left side spontaneously, can lift RLE off bed but cannot hold, minimal right hand grip. Right femoral puncture site stable, distal pulses (DPs) 1+ bilaterally. Dr. Karenann Cai at bedside to answer family questions- all questions answered and concerns addressed. For possible CIR once medically stable. Continue taking Brilinta 90 mg twice daily and Aspirin 81 mg once daily. Plan to follow-up with Dr. Estanislado Pandy in clinic 2 weeks after discharge ([facility] schedulers to call patient to set up this appointment). Further plans per [facility]/neurology- appreciate and agree with management. Please call [facility] with questions/concerns.   Electronically Signed: Earley Abide, PA-C 10/28/2020, 9:17 AM   I spent a total of 15 Minutes at the the patient's bedside AND on the patient's hospital floor or unit, greater than 50% of which was counseling/coordinating care for left MCA stenosis s/p revascularization.

## 2020-10-28 NOTE — Progress Notes (Signed)
Discussed with Dr. Leonie Man, she is still requiring ICU level of care, remains on neuro ICU service.  Please call [facility] to take over when she is transferring out of the ICU.  Wadell Craddock M. Cruzita Lederer, MD, PhD [facility]

## 2020-10-28 NOTE — Evaluation (Signed)
Physical Therapy Re-Evaluation Patient Details Name: Brittany Griffith MRN: [id number] DOB: 11/02/1950 Today's Date: 10/28/2020   History of Present Illness  70 y.o. female who presented to hospital with complaints of speech difficulty and right sided weakness but on EMS arrival symptoms had completely resolved. In ER later in the day, MD reported patient started to have difficulty with getting her words out. MRI revealed acute CVA involving L MCA in the L insula and parietal lobe. Abnormal neuro checks in AM (3/14); repeat CT repeated showing extension of f Left MCA infarct and cerebral edema with midline shift. S/p Left MCA angioplasty 3/16  by Dr. Jaquita Folds. PMH: HLD, HTN, osteopenia    Clinical Impression  The pt was seen by PT/OT in conjunction today to allow for safe OOB mobility following procedure. The pt remains highly motivated and followed commands well through the session, but benefits from repeated visual cues at times rather than verbal instructions. The pt was able to complete sit-stand transfers x4 from EOB with HHA of 2 and blocking of R knee. The pt was then able to complete pivoting steps to recliner as well as 2 short bouts of ambulation in the room. The pt currently requires mod/maxA of 2 to maintain balance, blocking of bilateral knees for stance, and assist with advancing RLE during gait. The pt continues to be great candidate for CIR level therapies, and will continue to benefit from skilled PT acutely to further progress functional strength, stability, and gait.     Follow Up Recommendations CIR    Equipment Recommendations  3in1 (PT) (defer to post acute)    Recommendations for Other Services Rehab consult     Precautions / Restrictions Precautions Precautions: Fall Restrictions Weight Bearing Restrictions: No      Mobility  Bed Mobility Overal bed mobility: Needs Assistance Bed Mobility: Rolling;Sidelying to Sit Rolling: Mod assist;+2 for physical  assistance Sidelying to sit: Mod assist       General bed mobility comments: Mod A to maintain RLE in flexed position and then push to roll L. Mod A to bring BLEs over EOB and then elevate trunk.    Transfers Overall transfer level: Needs assistance Equipment used: 2 person hand held assist Transfers: Sit to/from Stand Sit to Stand: Min assist;+2 physical assistance;+2 safety/equipment         General transfer comment: Min A +2 to power up into standing and maintain balance blocking at R knee to maintain  Ambulation/Gait Ambulation/Gait assistance: Max assist;Mod assist;+2 physical assistance Gait Distance (Feet): 3 Feet (+ 84ft + 8 ft) Assistive device: 2 person hand held assist Gait Pattern/deviations: Step-to pattern;Decreased step length - left;Decreased stance time - right;Decreased weight shift to right Gait velocity: decr Gait velocity interpretation: <1.31 ft/sec, indicative of household ambulator General Gait Details: pt relying on +2 to steady, blocking of bilateral knees with steps. progressively less blocking needed in RLE, may be able to progress to RW with hand-over-hand assist for grip   Modified Rankin (Stroke Patients Only) Modified Rankin (Stroke Patients Only) Pre-Morbid Rankin Score: No symptoms Modified Rankin: Moderately severe disability     Balance Overall balance assessment: Needs assistance Sitting-balance support: No upper extremity supported;Feet supported Sitting balance-Leahy Scale: Fair Sitting balance - Comments: Maintaining static sitting   Standing balance support: No upper extremity supported Standing balance-Leahy Scale: Poor Standing balance comment: Reliant on physical A  Pertinent Vitals/Pain Pain Assessment: Faces Faces Pain Scale: Hurts a little bit Pain Location: Generalized; unable to state Pain Descriptors / Indicators: Discomfort Pain Intervention(s): Monitored during  session;Repositioned    Home Living Family/patient expects to be discharged to:: Private residence Living Arrangements: Spouse/significant other;Children Available Help at Discharge: Family;Available 24 hours/day Type of Home: House Home Access: Stairs to enter Entrance Stairs-Rails: None Entrance Stairs-Number of Steps: 1 Home Layout: One level Home Equipment: None Additional Comments: family reports spouse was working but not plans to stay with pt    Prior Function Level of Independence: Independent         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, driving, independent and very active in [employer] certified [employer]   Dominant Hand: Right    Extremity/Trunk Assessment   Upper Extremity Assessment Upper Extremity Assessment: Defer to OT evaluation RUE Deficits / Details: Trace active movement. Increased edema. WFL for PROM RUE Coordination: decreased fine motor;decreased gross motor    Lower Extremity Assessment Lower Extremity Assessment: RLE deficits/detail RLE Deficits / Details: grossly 3/5, pt able to move against gravity with visual cues, unable to complete MMT due to inconsistent command following. R knee buckling in stance initially, progressed to minA to stand without buckling    Cervical / Trunk Assessment Cervical / Trunk Assessment: Normal  Communication   Communication: Receptive difficulties;Expressive difficulties  Cognition Arousal/Alertness: Awake/alert Behavior During Therapy: Flat affect (smiling with therapists) Overall Cognitive Status: Difficult to assess Area of Impairment: Attention;Problem solving;Awareness;Following commands;Safety/judgement                   Current Attention Level: Sustained   Following Commands: Follows one step commands inconsistently;Follows one step commands with increased time Safety/Judgement: Decreased awareness of deficits;Decreased awareness of safety Awareness: Intellectual Problem Solving: Slow  processing;Decreased initiation;Difficulty sequencing;Requires verbal cues;Requires tactile cues General Comments: Difficult to assess due to decreased verbalization and communication. Pt following simple, one step commands ~80% of time. Very motiavted to particiapte in therapy. Requiring increased time      General Comments General comments (skin integrity, edema, etc.): VSS on RA    Exercises     Assessment/Plan    PT Assessment Patient needs continued PT services  PT Problem List Decreased strength;Decreased activity tolerance;Decreased balance;Decreased mobility;Decreased coordination;Decreased cognition;Decreased knowledge of use of DME;Decreased safety awareness;Decreased knowledge of precautions       PT Treatment Interventions DME instruction;Gait training;Stair training;Functional mobility training;Therapeutic activities;Therapeutic exercise;Balance training;Neuromuscular re-education;Cognitive remediation;Patient/family education    PT Goals (Current goals can be found in the Care Plan section)  Acute Rehab PT Goals Patient Stated Goal: pt did not state PT Goal Formulation: With patient Time For Goal Achievement: 11/12/20 Potential to Achieve Goals: Good    Frequency Min 4X/week        Co-evaluation PT/OT/SLP Co-Evaluation/Treatment: Yes Reason for Co-Treatment: Complexity of the patient's impairments (multi-system involvement);For patient/therapist safety;To address functional/ADL transfers PT goals addressed during session: Mobility/safety with mobility;Balance OT goals addressed during session: ADL's and self-care       AM-PAC PT "6 Clicks" Mobility  Outcome Measure Help needed turning from your back to your side while in a flat bed without using bedrails?: A Lot Help needed moving from lying on your back to sitting on the side of a flat bed without using bedrails?: Total Help needed moving to and from a bed to a chair (including a wheelchair)?: A Lot Help  needed standing up from a chair using your arms (e.g., wheelchair or bedside chair)?: A  Lot Help needed to walk in hospital room?: A Lot Help needed climbing 3-5 steps with a railing? : Total 6 Click Score: 10    End of Session Equipment Utilized During Treatment: Gait belt Activity Tolerance: Patient tolerated treatment well Patient left: in chair;with call bell/phone within reach;with chair alarm set Nurse Communication: Mobility status PT Visit Diagnosis: Unsteadiness on feet (R26.81);Other abnormalities of gait and mobility (R26.89);Apraxia (R48.2)    Time: 6681-5947 PT Time Calculation (min) (ACUTE ONLY): 24 min   Charges:   PT Evaluation $PT Re-evaluation: 1 Re-eval          Karma Ganja, PT, DPT   Acute Rehabilitation Department Pager #: [phone]  Otho Bellows 10/28/2020, 6:21 PM

## 2020-10-28 NOTE — Progress Notes (Signed)
Modified Barium Swallow Progress Note  Patient Details  Name: Brittany Griffith MRN: [id number] Date of Birth: [birth year]  Today's Date: 10/28/2020  Modified Barium Swallow completed.  Full report located under Chart Review in the Imaging Section.  Brief recommendations include the following:  Clinical Impression  Pt's oral phase of dysphagia marked by holding due to apraxia , cognitive impairments and decreased manipulation resulting in delayed transit, lingual residue and piecemeal swallow pattern. Silent aspiration with nectar and thin liquid from impact of mildly decrease laryngeal elevation and minimally mistimed onset of laryngeal protection. Pharyngeal strength, contraction and epiglottic inversion were adequate. Pt has significant verbal apraxia and was unable to volitionally throat clear or cough and no sensory response to aspirate. Comprehension and cognitive deficits prevented compensatory strategies.There was no significant pharyngeal stasis. Recommend Dys 1 (puree) texture, honey thick liquids with full supervision and slow pace. Meds via Cortrak for now. Suspect intake will be slow to progress with prognosis is good.   Swallow Evaluation Recommendations       SLP Diet Recommendations: Dysphagia 1 (Puree) solids;Honey thick liquids   Liquid Administration via: Cup   Medication Administration: Via alternative means   Supervision: Staff to assist with self feeding;Full supervision/cueing for compensatory strategies   Compensations: Slow rate;Minimize environmental distractions;Small sips/bites   Postural Changes: Seated upright at 90 degrees   Oral Care Recommendations: Oral care BID   Other Recommendations: Order thickener from pharmacy    Houston Siren 10/28/2020,1:56 PM  Orbie Pyo Colvin Caroli.Ed Risk analyst [phone] Office [phone]

## 2020-10-28 NOTE — Progress Notes (Signed)
Potassium of 3.1. Replacement with 98mEq KCL x 2 ordered. Recheck K with morning labs. Also started Sliding scale insulin for glucose of 231 after tube feeds were initiated.  [place] Pager Number [phone]

## 2020-10-29 DIAGNOSIS — R569 Unspecified convulsions: Secondary | ICD-10-CM

## 2020-10-29 DIAGNOSIS — I6602 Occlusion and stenosis of left middle cerebral artery: Secondary | ICD-10-CM

## 2020-10-29 DIAGNOSIS — I63412 Cerebral infarction due to embolism of left middle cerebral artery: Secondary | ICD-10-CM

## 2020-10-29 LAB — BASIC METABOLIC PANEL
BUN: 13 mg/dL (ref 8–23)
CO2: 20 mmol/L — ABNORMAL LOW (ref 22–32)
Calcium: 9.1 mg/dL (ref 8.9–10.3)
Chloride: >130 mmol/L (ref 98–111)
Creatinine, Ser: 0.76 mg/dL (ref 0.44–1.00)
GFR, Estimated: >60 mL/min (ref >60)
Glucose, Bld: 182 mg/dL — ABNORMAL HIGH (ref 70–99)
Potassium: 4.5 mmol/L (ref 3.5–5.1)
Sodium: 157 mmol/L — ABNORMAL HIGH (ref 135–145)

## 2020-10-29 LAB — CBC
HCT: 29.7 % — ABNORMAL LOW (ref 36.0–46.0)
Hemoglobin: 10.3 g/dL — ABNORMAL LOW (ref 12.0–15.0)
MCH: 29.9 pg (ref 26.0–34.0)
MCHC: 34.7 g/dL (ref 30.0–36.0)
MCV: 86.3 fL (ref 80.0–100.0)
Platelets: 179 10*3/uL (ref 150–400)
RBC: 3.44 MIL/uL — ABNORMAL LOW (ref 3.87–5.11)
RDW: 13.2 % (ref 11.5–15.5)
WBC: 9.6 10*3/uL (ref 4.0–10.5)
nRBC: 0 % (ref 0.0–0.2)

## 2020-10-29 LAB — GLUCOSE, CAPILLARY
Glucose-Capillary: 152 mg/dL — ABNORMAL HIGH (ref 70–99)
Glucose-Capillary: 143 mg/dL — ABNORMAL HIGH (ref 70–99)
Glucose-Capillary: 131 mg/dL — ABNORMAL HIGH (ref 70–99)
Glucose-Capillary: 149 mg/dL — ABNORMAL HIGH (ref 70–99)
Glucose-Capillary: 128 mg/dL — ABNORMAL HIGH (ref 70–99)
Glucose-Capillary: 142 mg/dL — ABNORMAL HIGH (ref 70–99)

## 2020-10-29 LAB — SODIUM
Sodium: 156 mmol/L — ABNORMAL HIGH (ref 135–145)
Sodium: 154 mmol/L — ABNORMAL HIGH (ref 135–145)
Sodium: 154 mmol/L — ABNORMAL HIGH (ref 135–145)

## 2020-10-29 LAB — MAGNESIUM
Magnesium: 2.1 mg/dL (ref 1.7–2.4)
Magnesium: 2 mg/dL (ref 1.7–2.4)

## 2020-10-29 LAB — PHOSPHORUS
Phosphorus: 1.3 mg/dL — ABNORMAL LOW (ref 2.5–4.6)
Phosphorus: 2.8 mg/dL (ref 2.5–4.6)

## 2020-10-29 MED ORDER — LOSARTAN POTASSIUM 50 MG PO TABS
50.0000 mg | ORAL_TABLET | Freq: Every day | ORAL | Status: DC
  Administered 2020-10-29: 50 mg via ORAL
  Filled 2020-10-29: qty 1

## 2020-10-29 MED ORDER — AMLODIPINE BESYLATE 10 MG PO TABS
10.0000 mg | ORAL_TABLET | Freq: Every day | ORAL | Status: DC
  Administered 2020-10-29 – 2020-11-01 (×4): 10 mg
  Filled 2020-10-29 (×4): qty 1

## 2020-10-29 MED ORDER — K PHOS MONO-SOD PHOS DI & MONO 155-852-130 MG PO TABS
250.0000 mg | ORAL_TABLET | Freq: Once | ORAL | Status: DC
  Filled 2020-10-29: qty 1

## 2020-10-29 MED ORDER — HYDRALAZINE HCL 20 MG/ML IJ SOLN
5.0000 mg | INTRAMUSCULAR | Status: DC | PRN
  Administered 2020-10-30 – 2020-10-31 (×3): 10 mg via INTRAVENOUS
  Filled 2020-10-29 (×4): qty 1

## 2020-10-29 MED ORDER — K PHOS MONO-SOD PHOS DI & MONO 155-852-130 MG PO TABS
500.0000 mg | ORAL_TABLET | Freq: Once | ORAL | Status: AC
  Administered 2020-10-29: 500 mg via ORAL
  Filled 2020-10-29: qty 2

## 2020-10-29 MED ORDER — K PHOS MONO-SOD PHOS DI & MONO 155-852-130 MG PO TABS
250.0000 mg | ORAL_TABLET | Freq: Once | ORAL | Status: AC
  Administered 2020-10-29: 250 mg
  Filled 2020-10-29: qty 1

## 2020-10-29 MED ORDER — LOSARTAN POTASSIUM 50 MG PO TABS
50.0000 mg | ORAL_TABLET | Freq: Every day | ORAL | Status: DC
  Administered 2020-10-30: 50 mg
  Filled 2020-10-29: qty 1

## 2020-10-29 NOTE — Progress Notes (Addendum)
STROKE TEAM PROGRESS NOTE   INTERVAL HISTORY No acute events overnight.  Son at bedside.   S/p Left MCA angioplasty 3/16  by Dr. Jaquita Folds.  RUE with worsening weakness yesterday, Head CT repeated showing extension of f Left MCA infarct and cerebral edema with midline shift. HTS started. RUE w/continued severe weakness.  Able to follow some simple commands. Remains aphasic. Son says she told him "no" this morning.  BP adequately controlled with frequent prns. Daily po Norvasc increased to 10mg . Ate 50% of last meal. SLP feels cortrak needed for the present per discussion. Stroke diagnosis, plan of care and ongoing work up discussed with family at the bedside. Questions answered.   Vitals:   10/29/20 0400 10/29/20 0500 10/29/20 0600 10/29/20 0700  BP: (!) 164/71 (!) 165/82 (!) 176/75 (!) 178/64  Pulse: 70 78 79 71  Resp: 19 20 20  (!) 21  Temp: 99.3 F (37.4 C)     TempSrc: Oral     SpO2: 98% 99% 97% 98%  Weight: 59.9 kg     Height:       CBC:  Recent Labs  Lab 10/24/20 0448 10/25/20 0224 10/27/20 0624 10/29/20 0243  WBC 6.5   < > 10.8* 9.6  NEUTROABS 5.2  --  7.5  --   HGB 12.8   < > 10.9* 10.3*  HCT 38.1   < > 31.6* 29.7*  MCV 85.4   < > 83.6 86.3  PLT 220   < > 202 179   < > = values in this interval not displayed.   Basic Metabolic Panel:  Recent Labs  Lab 10/28/20 1727 10/29/20 0243  NA 150* 157*  K 3.1* 4.5  CL 124* >130*  CO2 18* 20*  GLUCOSE 160* 182*  BUN 10 13  CREATININE 0.61 0.76  CALCIUM 8.7* 9.1  MG 1.8 2.1  PHOS 3.1 1.3*   Lipid Panel:  Recent Labs  Lab 10/24/20 0448 10/28/20 0206  CHOL 248*  --   TRIG 58 85  HDL 77  --   CHOLHDL 3.2  --   VLDL 12  --   LDLCALC 159*  --    HgbA1c:  Recent Labs  Lab 10/24/20 0448  HGBA1C 5.6   Urine Drug Screen:  Recent Labs  Lab 10/23/20 1430  LABOPIA NONE DETECTED  COCAINSCRNUR NONE DETECTED  LABBENZ NONE DETECTED  AMPHETMU NONE DETECTED  THCU NONE DETECTED  LABBARB NONE DETECTED     IMAGING past 24 hours  MRI BRAIN WO 10/24/2020 Acute infarct in the left MCA territory with significant progression since yesterday. This involves the insula and deep white matter with small areas of cortical infarct. 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 No associated brain edema.  CT HEAD WO CONTRAST  Result Date: 10/24/2020 1. Appearance suspicious for new Left PCA territory ischemia in the occipital pole. However, this might be streak artifact related to the complex left skull lesion.  2. Cytotoxic edema now evident at the posterior left insula and mildly larger than the DWI lesion yesterday.  3. Stable brain otherwise. No acute hemorrhage or increased intracranial mass effect. 4. Stable complex left frontal convexity meningioma(s) and skull hyperostosis.   MR BRAIN WO CONTRAST  Result Date: 10/24/2020 IMPRESSION: Acute infarct in the left MCA territory with significant progression since yesterday. This involves the insula and deep white matter with  small areas of cortical infarct. 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 No associated brain edema.   MR Brain W and Wo Contrast  Result Date: 10/23/2020 IMPRESSION:  1. 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  2. 2.6 cm enhancing left frontal parafalcine mass consistent with a meningioma with mild edema and minimal rightward midline shift. This is associated with extensive dural ossification and left frontal skull hyperostosis along the anterior and lateral cerebral convexities. 3. Smaller meningiomas along the falx in the posterior left frontal region and  laterally in the left sylvian fissure.  4. 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  CTA HEAD/NECK IMPRESSION: 1. 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 Mild edema in the left frontal lobe. Trace local rightward midline shift. Brain MRI without and with contrast is recommended for further evaluation. 2. No evidence of an acute infarct or intracranial hemorrhage. 3. No large vessel occlusion. 4. Intracranial atherosclerosis including moderate right and severe left M1 stenoses and moderate bilateral P2 stenoses. 5. Widely patent cervical carotid and vertebral arteries. 6. 2.3 cm right thyroid nodule. Recommend thyroid US (ref: J Am Coll  EEG adult Result Date: 10/23/2020 IMPRESSION: This study is suggestive of cortical dysfunction in left temporal region which is non specific but could be secondary to underlying structural abnormality. No seizures or epileptiform discharges were seen throughout the recording. [name]   CTA head Result date 10/25/2020 Evolving recent left MCA territory infarctions. No acute intracranial hemorrhage or significant mass effect.  Similar appearance of intracranial atherosclerosis compared to recent prior study.  PHYSICAL EXAM Physical Exam  HEENT-  Skull asymmetry due to hyperostosis. No trauma seen.    Lungs- Respirations unlabored Extremities- No edema  Neurological Examination  Mental Status:   Patient is mildly drowsy. She partially opens eyes to stimulation.  Unable to follow most verbal commands - will follow one step commands only. No verbal response. Does not answer any questions.  Cranial Nerves: II: Visual fields cannot be formally tested   III,IV, VI: EOMI noncooperative  V,VII: Unable to formally test sensation, but reacts to touch bilaterally. Right facial droop is prominent when smiling.  VIII: Hearing intact to some  commands.  IX,X: Unable to assess. Not following commands for mouth opening.  XI: Head is midline XII: Did not protrude tongue.  Motor: RUE 1/5 grip, biceps, triceps and deltoid. Drift present LUE 4+/5 grip, biceps, triceps and deltoid. RLE 4/5 hip and knee flexion LLE 5/5 hip and knee flexion Sensory: Reacts to touch x 4 but does not answer questions regarding the intensity of the touch or temperature stimuli Cerebellar: ataxia on FN bilaterally Gait: Deferred due to falls risk concerns   ASSESSMENT/PLAN Brittany Griffith is a 70 y.o. female with history of hypertension who was found sitting on the floor slumped over, nonverbal and unable to follow commands, but awake, tracking with her eyes and crying.  Husband then lifted his wife off the floor and noticed right-sided weakness and a right-sided facial droop. LKN was 0930. She continued to exhibit the same symptoms on EMS arrival to the home, with resolution approximately 20 minutes later.  MRI revealed small strokes scattered in the left MCA territory in the context of severe left M1 stenosis seen on CTA. Of  note, she also has multiple left hemisphere meningiomas, the largest being associated with mild edema and minimal rightward shift.  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  S/p left MCA angioplasty and stenting 10/26/2020  With infarct extension and cytotoxic edema on repeat imaging 10/27/20  -Admission CT head: left PCA territory ischemia   -Repeat HCT 3/17 due RUE weakness: re-occlusion of Left MCA. Extensive infarct throughout much of the left temporal lobe,posterior left frontal lobe, anterior left parietal lobe consistentwith evolving left middle cerebral artery distribution infarct. Thisinfarct includes portions of the left external capsule, claustrum,extreme capsule, and insular cortex. There is cytotoxic edema causing effacement of the left frontal horn and 6 mm of midline shift toward the  right. Fourth ventricle midline. -AdmissionCTA head & neck: left frontal parafalcine mass with extensive regional hyperostosis which may reflect an en plaque meningioma. Mild edema in the left frontal lobe. Trace local rightward midline shift. Intracranial atherosclerosis including moderate right and severe left M1 stenoses and moderate bilateral P2 stenoses -MRI brain w/o: acute infarct in the left MCA territory with significant progression since yesterday. This involves the insula and deep white matter with small areas of cortical infarct. Negative for hemorrhagic transformation -MRA: ubcentimeter acute left MCA infarcts in the left insula and parietal lobe.  2. 2.6 cm enhancing left frontal parafalcine mass  consistent with a meningioma with mild edema and minimal  rightward midline shift. Smaller meningiomas along the falx in the       posterior left frontal region and laterally in the left sylvian fissure. -Repeat CTA head (3/15): Evolving recent left MCA territory infarctions. Stable appearance of left greater than right M1 stenoses. Distal MCA branch atherosclerotic irregularity is unchanged  Repeat MRI/MRA today is pending  2D Echo pending  LDL 159  HgbA1c 5.6  VTE prophylaxis - SCDs  Heart Healthy diet  Swallow eval pending today   No antithrombotic prior to admission. On ASA 81mg             daily and Brilinta 90 mg BID   Therapy recommendations: CIR  Disposition:  pending  Meningiomas -Neurosurgery consulted: No acute neurosurgical intervention indicated  Left M1 stenosis -Dr. Estanislado Pandy consulted, patient taken to IR 3/16 where she underwent cerebral arteriogram with successful revascularization/stenting of Left MCA  Concern for partial complex seizures -Brief episode of aphasia and right sided weakness at home -EEG: cortical dysfunction in left temporal region which is non specific but could be secondary to underlying structural abnormality. No seizures or epileptiform  discharges were seen throughout the recording.  Cerebral Edema with midline shift -HTS protocol reduced to 25cc/hr -Monitor sodium q 6 hours per protocol -Na 139->148->157 -Continue Neuro ICU care  Dysphagia -SLP mod barium: Dysphagia I diet and honey thick liquids with alternative means for medications recommended -Cortrak in place with TF, Osmolite, RD following -Monitor hydration status with daily BMP  Hypertension -Home meds:  None -Remains hypertensive, requiring prn IV antihypertensives  Norvasc 5mg  daily added 1/57 -Keep systolic BP less than 262, Avoid hypotension  -Long-term BP goal normotensive  Hyperlipidemia -Home meds:  none -LDL 159, goal < 70 -Atorvastatin 80mg  daily -Continue statin at discharge  Glucose management -Mild Hyperglycemia with tube feeding initiation (140-213) -SSI  Hypokalemia 3.1->4.5 Monitor and replete   Hypohosphatemia 3.1->1.3 Replete and monitor   Other Stroke Risk Factors -Advanced Age >/= 61   Other Active Problems -2.3 cm right thyroid nodule. Recommend PCP followup and thyroid US -Left frontal meningioma  likely asymptomatic and longstanding can be followed conservatively and no need for neurosurgical treatment at the present time.  This plan of care was directed by Dr. Erlinda Hong.  Hetty Blend, NP-C, MSM  ATTENDING NOTE: I reviewed above note and agree with the assessment and plan. Pt was seen and examined.   70 year old female with history of hypertension admitted for stopover, aphasia, right-sided weakness, right facial droop but symptoms resolved in 20 minutes. CT head showed left frontal parafalcine mass with edema and midline shift, consistent with aggressive meningioma.  CTA head and neck showed bilateral M1 high-grade stenosis and bilateral P2 moderate stenosis.  MRI 10/23/2020 showed 3 punctate infarcts at left insular cortex.  However, patient symptoms getting worse with increased right-sided weakness on  10/24/2020.  CT showed extension of left MCA infarct.  MRI confirmed left MCA infarct extension.  Repeat CTA head showed similar bilateral M1 high-grade stenosis.  Status post left M1 stent on 10/26/2020.  However, patient symptoms continue to be worse with right hemiplegia.  10/27/2020 CT repeat showed left large MCA infarct.  LDL 159, A1c 5.6.  EEG negative for seizure.  EF 60 to 65%.  Son at bedside.  On exam, patient awake, alert, eyes open, global aphasia, not following commands, nonverbal. Able to mimic simple hand movement. Left gaze preference and occasionally pass midline, frequent blinking spontaneously, but seems able to have additional blinking to visual threat on the left but not consistently to the right, PERRL. Right facial droop. Tongue protrusion not cooperative. Left UE no drift. LLE spontaneous movement against gravity. However, RUE and RLE slight withdraw to pain. Sensation, coordination and gait not tested.  Currently patient 3% saline for increased mass-effect.  On tube feeding for nutrition.  BP 160s 180s, on Norvasc 10 and losartan 50.  Patient does have seizure like activity, although EEG negative, patient on Keppra at this time 500 mg twice daily.  Continue aspirin Brilinta and Lipitor 80 post stent. Had long discussion with son at bedside, will repeat MRI and MRA.  If mass-effect stable or improved, will DC 3% saline.  This patient is critically ill due to left MCA large infarct, status post left M1 stent, aggressive meningioma and at significant risk of neurological worsening, death form cerebral edema, hemorrhagic conversion, seizure. This patient's care requires constant monitoring of vital signs, hemodynamics, respiratory and cardiac monitoring, review of multiple databases, neurological assessment, discussion with family, other specialists and medical decision making of high complexity. I spent 45 minutes of neurocritical care time in the care of this patient. I had long discussion  with son at bedside, updated pt current condition, treatment plan and potential prognosis, and answered all the questions.  He expressed understanding and appreciation.     Rosalin Hawking, MD PhD Stroke Neurology 10/29/2020 7:38 PM     To contact Stroke Continuity provider, please refer to [email or web address] After hours, contact General Neurology

## 2020-10-29 NOTE — Progress Notes (Signed)
Inpatient Rehab Admissions Coordinator:  Awaiting insurance approval for potential CIR admission. Will continue to follow.   Gayland Curry, [place], [place] Admissions Coordinator [phone]

## 2020-10-29 NOTE — Progress Notes (Signed)
  Speech Language Pathology Treatment: Dysphagia;Cognitive-Linquistic  Patient Details Name: Brittany Griffith MRN: [id number] DOB: [birth year] Today's Date: 10/29/2020 Time: 5188-4166 SLP Time Calculation (min) (ACUTE ONLY): 32 min  Assessment / Plan / Recommendation Clinical Impression  Pt completed meal with son present who reported consumed approximately 50% and denied coughing. Pt agreeable to several sips honey thick liquid noted with mild oral holding, suspected delayed initiation and delayed cough (5-8 min however suspicious for po etiology). No oral pocketing.  Son face timed sister and husband and therapist provided thorough education for MBS results, recommendations and goals for her swallow. Son had pertinent question re: speech/lang/comprehension and therapist explained/educated impairments of aphasia, apraxia, pt's current state, goals and strategies to assist her in communicating her needs.  She was unable to follow command with verbal cues (apraxia). Gestured to yes/no question accurately with 25% (nodded yes). Unable to elicit vocalization on command or with automatic utterances.  As therapist documenting, heard pt crying loudly and determined to be [place] came into hall and therapist explained positivity in hearing her make volitional phonation and her awareness of situation to certain degree.    HPI HPI: Patient is a 70 y.o. female with PMH: HLD, HTN, osteopenia who presented to hospital with complaints of speech difficulty and right sided weakness but on EMS arrival symptoms had completely resolved. In ER later in the day, MD reported patient started to have difficulty with getting her words out. 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 The next day (3/14) patient had abnormal neuro checks in AM and repeat MRI showed significant progression of acute infarct in the left MCA territory but negative for hemorrhagic transformation. On 3/16 pt underwent  Patient underwent an image-guided cerebral arteriogram with revascularization of right MCA M1. Repeat head CT 3/17 revealed Hyperdense vessel left middle cerebral artery consistent with  occlusion of this vessel and extensive infarct throughout much of the left temporal lobe,  posterior left frontal lobe, anterior left parietal lobe consistent  with evolving left middle cerebral artery distribution infarct.  6 mm of midline  shift toward the right      SLP Plan  Continue with current plan of care       Recommendations  Diet recommendations: Dysphagia 1 (puree);Honey-thick liquid Liquids provided via: Cup Medication Administration: Via alternative means (or crushed) Supervision: Patient able to self feed;Staff to assist with self feeding;Full supervision/cueing for compensatory strategies Compensations: Slow rate;Minimize environmental distractions;Small sips/bites Postural Changes and/or Swallow Maneuvers: Seated upright 90 degrees                Oral Care Recommendations: Oral care BID Follow up Recommendations: Inpatient Rehab SLP Visit Diagnosis: Dysphagia, oropharyngeal phase (R13.12);Aphasia (R47.01);Apraxia (R48.2);Cognitive communication deficit (A63.016) Plan: Continue with current plan of care       GO                Houston Siren 10/29/2020, 10:40 AM  Orbie Pyo Colvin Caroli.Ed Risk analyst [phone] Office [phone]

## 2020-10-29 NOTE — Progress Notes (Signed)
Neuro MD paged at 0400. Sodium level 157, previous 150. Chloride >130. Response from MD at 0440. New verbal order to change rate of 3% from 75 ml/hr to 25 ml/hr.

## 2020-10-30 ENCOUNTER — Inpatient Hospital Stay (HOSPITAL_COMMUNITY): Payer: Medicare HMO

## 2020-10-30 LAB — BASIC METABOLIC PANEL
Anion gap: 15 (ref 5–15)
BUN: 17 mg/dL (ref 8–23)
CO2: 20 mmol/L — ABNORMAL LOW (ref 22–32)
Calcium: 9.4 mg/dL (ref 8.9–10.3)
Chloride: 120 mmol/L — ABNORMAL HIGH (ref 98–111)
Creatinine, Ser: 0.64 mg/dL (ref 0.44–1.00)
GFR, Estimated: >60 mL/min (ref >60)
Glucose, Bld: 125 mg/dL — ABNORMAL HIGH (ref 70–99)
Potassium: 3 mmol/L — ABNORMAL LOW (ref 3.5–5.1)
Sodium: 155 mmol/L — ABNORMAL HIGH (ref 135–145)

## 2020-10-30 LAB — SODIUM
Sodium: 156 mmol/L — ABNORMAL HIGH (ref 135–145)
Sodium: 155 mmol/L — ABNORMAL HIGH (ref 135–145)

## 2020-10-30 LAB — CBC
HCT: 34.2 % — ABNORMAL LOW (ref 36.0–46.0)
Hemoglobin: 11.6 g/dL — ABNORMAL LOW (ref 12.0–15.0)
MCH: 29.3 pg (ref 26.0–34.0)
MCHC: 33.9 g/dL (ref 30.0–36.0)
MCV: 86.4 fL (ref 80.0–100.0)
Platelets: 188 10*3/uL (ref 150–400)
RBC: 3.96 MIL/uL (ref 3.87–5.11)
RDW: 13.4 % (ref 11.5–15.5)
WBC: 12.7 10*3/uL — ABNORMAL HIGH (ref 4.0–10.5)
nRBC: 0 % (ref 0.0–0.2)

## 2020-10-30 LAB — PHOSPHORUS: Phosphorus: 3.3 mg/dL (ref 2.5–4.6)

## 2020-10-30 LAB — GLUCOSE, CAPILLARY
Glucose-Capillary: 121 mg/dL — ABNORMAL HIGH (ref 70–99)
Glucose-Capillary: 133 mg/dL — ABNORMAL HIGH (ref 70–99)
Glucose-Capillary: 102 mg/dL — ABNORMAL HIGH (ref 70–99)
Glucose-Capillary: 98 mg/dL (ref 70–99)
Glucose-Capillary: 111 mg/dL — ABNORMAL HIGH (ref 70–99)
Glucose-Capillary: 94 mg/dL (ref 70–99)

## 2020-10-30 LAB — MAGNESIUM: Magnesium: 2 mg/dL (ref 1.7–2.4)

## 2020-10-30 MED ORDER — FREE WATER
100.0000 mL | Status: DC
  Administered 2020-10-30 – 2020-11-01 (×9): 100 mL

## 2020-10-30 MED ORDER — OSMOLITE 1.2 CAL PO LIQD
600.0000 mL | ORAL | Status: DC
  Filled 2020-10-30: qty 711

## 2020-10-30 MED ORDER — ENOXAPARIN SODIUM 40 MG/0.4ML ~~LOC~~ SOLN
40.0000 mg | SUBCUTANEOUS | Status: DC
  Administered 2020-10-30 – 2020-10-31 (×2): 40 mg via SUBCUTANEOUS
  Filled 2020-10-30 (×2): qty 0.4

## 2020-10-30 MED ORDER — POTASSIUM CHLORIDE 20 MEQ PO PACK
60.0000 meq | PACK | Freq: Once | ORAL | Status: AC
  Administered 2020-10-30: 60 meq via ORAL
  Filled 2020-10-30: qty 3

## 2020-10-30 MED ORDER — OSMOLITE 1.2 CAL PO LIQD
600.0000 mL | ORAL | Status: DC
  Administered 2020-10-30 – 2020-10-31 (×2): 600 mL
  Filled 2020-10-30 (×3): qty 711

## 2020-10-30 MED ORDER — LOSARTAN POTASSIUM 50 MG PO TABS
50.0000 mg | ORAL_TABLET | Freq: Two times a day (BID) | ORAL | Status: DC
  Administered 2020-10-30 – 2020-11-01 (×4): 50 mg
  Filled 2020-10-30 (×4): qty 1

## 2020-10-30 NOTE — Progress Notes (Addendum)
STROKE TEAM PROGRESS NOTE   INTERVAL HISTORY No acute events overnight.  Son at bedside.   RUE w/continued severe weakness.  Able to follow some simple commands on the left upper and bilateral lower extremities. Remains aphasic.  Afebrile. BP continue to elevated.  Increased losartan to 50mg  bid today.  Ate 50% of breakfast this morning. SLP feels cortrak needed for the present per discussion. Stroke diagnosis, plan of care and ongoing work up discussed with family at the bedside. Questions answered.   Vitals:   10/30/20 1400 10/30/20 1500 10/30/20 1535 10/30/20 1600  BP: (!) 162/57 (!) 181/104 (!) 156/62 (!) 140/55  Pulse: 83 87  76  Resp: 15 (!) 21  17  Temp:    (!) 97.2 F (36.2 C)  TempSrc:    Axillary  SpO2: 98% 96%  97%  Weight:      Height:       CBC:  Recent Labs  Lab 10/24/20 0448 10/25/20 0224 10/27/20 0624 10/29/20 0243 10/30/20 0154  WBC 6.5   < > 10.8* 9.6 12.7*  NEUTROABS 5.2  --  7.5  --   --   HGB 12.8   < > 10.9* 10.3* 11.6*  HCT 38.1   < > 31.6* 29.7* 34.2*  MCV 85.4   < > 83.6 86.3 86.4  PLT 220   < > 202 179 188   < > = values in this interval not displayed.   Basic Metabolic Panel:  Recent Labs  Lab 10/29/20 0243 10/29/20 1129 10/29/20 1748 10/29/20 2246 10/30/20 0154 10/30/20 1056  NA 157*   < > 154*   < > 155* 156*  K 4.5  --   --   --  3.0*  --   CL >130*  --   --   --  120*  --   CO2 20*  --   --   --  20*  --   GLUCOSE 182*  --   --   --  125*  --   BUN 13  --   --   --  17  --   CREATININE 0.76  --   --   --  0.64  --   CALCIUM 9.1  --   --   --  9.4  --   MG 2.1  --  2.0  --  2.0  --   PHOS 1.3*  --  2.8  --  3.3  --    < > = values in this interval not displayed.   Lipid Panel:  Recent Labs  Lab 10/24/20 0448 10/28/20 0206  CHOL 248*  --   TRIG 58 85  HDL 77  --   CHOLHDL 3.2  --   VLDL 12  --   LDLCALC 159*  --    HgbA1c:  Recent Labs  Lab 10/24/20 0448  HGBA1C 5.6   Urine Drug Screen:  No results for  input(s): LABOPIA, COCAINSCRNUR, LABBENZ, AMPHETMU, THCU, LABBARB in the last 168 hours.  IMAGING past 24 hours  MRI BRAIN WO 10/24/2020 Acute infarct in the left MCA territory with significant progression since yesterday. This involves the insula and deep white matter with small areas of cortical infarct. 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 No associated brain edema.  CT HEAD WO CONTRAST  Result Date: 10/24/2020 1. Appearance suspicious for new  Left PCA territory ischemia in the occipital pole. However, this might be streak artifact related to the complex left skull lesion.  2. Cytotoxic edema now evident at the posterior left insula and mildly larger than the DWI lesion yesterday.  3. Stable brain otherwise. No acute hemorrhage or increased intracranial mass effect. 4. Stable complex left frontal convexity meningioma(s) and skull hyperostosis.   MR BRAIN WO CONTRAST  Result Date: 10/24/2020 IMPRESSION: Acute infarct in the left MCA territory with significant progression since yesterday. This involves the insula and deep white matter with small areas of cortical infarct. 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 No associated brain edema.   MR Brain W and Wo Contrast  Result Date: 10/23/2020 IMPRESSION:  1. 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  2. 2.6 cm enhancing left frontal parafalcine mass consistent with a meningioma with mild edema and minimal rightward midline shift. This is associated with extensive dural ossification and left frontal skull hyperostosis along the anterior and lateral cerebral  convexities. 3. Smaller meningiomas along the falx in the posterior left frontal region and laterally in the left sylvian fissure.  4. 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  CTA HEAD/NECK IMPRESSION: 1. 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 Mild edema in the left frontal lobe. Trace local rightward midline shift. Brain MRI without and with contrast is recommended for further evaluation. 2. No evidence of an acute infarct or intracranial hemorrhage. 3. No large vessel occlusion. 4. Intracranial atherosclerosis including moderate right and severe left M1 stenoses and moderate bilateral P2 stenoses. 5. Widely patent cervical carotid and vertebral arteries. 6. 2.3 cm right thyroid nodule. Recommend thyroid US (ref: J Am Coll  EEG adult Result Date: 10/23/2020 IMPRESSION: This study is suggestive of cortical dysfunction in left temporal region which is non specific but could be secondary to underlying structural abnormality. No seizures or epileptiform discharges were seen throughout the recording. Brittany Griffith   CTA head Result date 10/25/2020 Evolving recent left MCA territory infarctions. No acute intracranial hemorrhage or significant mass effect.  Similar appearance of intracranial atherosclerosis compared to recent prior study.  PHYSICAL EXAM Physical Exam  HEENT-  Skull asymmetry due to hyperostosis. No trauma seen.    Lungs- Respirations unlabored Extremities- No edema  Neurological Examination  Mental Status:   Patient is awake. Will follow one step commands only. No verbal response. Does not answer any questions. Global aphasia. Cranial Nerves: II: Visual fields cannot be formally tested   III,IV, VI: EOMI  V,VII: Unable to formally test sensation, but reacts to touch bilaterally. Right facial droop is prominent when smiling.  VIII: Hearing intact to  some commands.  IX,X: Unable to assess. Not following commands for mouth opening.  XI: Head is midline XII: Did not protrude tongue.  Motor: RUE 1/5 grip, biceps, triceps and deltoid. Drift present LUE 4+/5 grip, biceps, triceps and deltoid. RLE 4/5 hip and knee flexion LLE 4+/5 hip and knee flexion Sensory: Reacts to touch x 4 but does not answer questions regarding the intensity of the touch or temperature stimuli Cerebellar: ataxia on FN bilaterally Gait: Deferred due to falls risk concerns   ASSESSMENT/PLAN Brittany Griffith is a 70 y.o. female with history of hypertension who was found sitting on the  floor slumped over, nonverbal and unable to follow commands, but awake, tracking with her eyes and crying.  Husband then lifted his wife off the floor and noticed right-sided weakness and a right-sided facial droop. LKN was 0930. She continued to exhibit the same symptoms on EMS arrival to the home, with resolution approximately 20 minutes later.  MRI revealed small strokes scattered in the left MCA territory in the context of severe left M1 stenosis seen on CTA. Of note, she also has multiple left hemisphere meningiomas, the largest being associated with mild edema and minimal rightward shift.  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 Stent occlusion  CT head 3/13 no acute infarct    CTA head and neck 3/13 bilateral M1 high-grade stenosis and bilateral P2 moderate stenosis.    MRI 10/23/2020 showed 3 punctate infarcts at left insular cortex.  Neuro worsening with CT 3/14 showed extension of left MCA infarct.    MRI 3/14 confirmed left MCA infarct extension.    Repeat CTA head 3/15 similar bilateral M1 high-grade stenosis.    Status post left M1 stent on 10/26/2020.    However, patient symptoms continue to worsen with right hemiplegia, therefore 10/27/2020 CT  repeat which showed left large MCA infarct.    MRI 3/20 large left MCA infarct  MRA signal loss related to left MCA stents, concerning for occlusion   EEG negative for seizure.    EF 60 to 65%.  LDL 159  HgbA1c 5.6  VTE prophylaxis - lovenox  No antithrombotic prior to admission. On ASA 81mg  daily and Brilinta 90 mg BID post stenting  Therapy recommendations: CIR  Disposition:  pending  Meningioma  CT head showed left frontal parafalcine mass with edema and midline shift, consistent with aggressive meningioma.  Neurosurgery consulted: No acute neurosurgical intervention indicated, Left frontal meningioma likely asymptomatic and longstanding can be followed conservatively and no need for neurosurgical treatment at the present time.  ? partial complex seizure  Brief episode of aphasia and right sided weakness at home  EEG: cortical dysfunction in left temporal region, No epileptiform discharges   On keppra 500mg  bid now  Cerebral Edema with minimal midline shift  Was on 3% saline, discontinued 3/20  Allow Na gradually trending down  Na 139->148->157->155->156  On TF and FW  Dysphagia  SLP mod barium: Dysphagia I diet and honey thick liquids with alternative means for medications recommended  Cortrak in place with TF  Nutrition consult for nocturnal tube feeds and calorie count  Encourage po intake  Free water 100 Q4h  Hypertension  Home meds:  None  Remains hypertensive, requiring prn IV antihypertensives  Norvasc 10mg  daily  Increase losartan to 50mg  bid  Keep systolic BP less than 606   Avoid hypotension   Long-term BP goal normotensive  Hyperlipidemia  Home meds:  none  LDL 159, goal < 70  Atorvastatin 80mg  daily  Continue statin at discharge  Hypokalemia  3.1->4.5->3.0  Replete with potassium chloride 92meq packet  Check level in am   Leukocytosis   WBC 10.8->9.6->12.7  Repeat UA pending  Other Stroke Risk  Factors  Advanced Age >/= 29   Other Active Problems  2.3 cm right thyroid nodule. Recommend PCP followup and thyroid US  Lissy Olivencia-Simmons, ACNP-BC Stroke NP 10/30/2020   ATTENDING NOTE: I reviewed above note and agree with the assessment and plan. Pt was seen  and examined.   Son at bedside, patient more awake alert today and more interactive than yesterday.  Eyes open, global aphasia, however able to close and open eyes on request, not able to follow other simple commands.  Nonverbal.  Not able to name or repeat.  No gaze preference today, tracking bilaterally to voice.  Blinking to visual threat on the left consistently, however inconsistent on the right.  Right facial droop.  Right upper extremity 0/5 with increased muscle tone, however right lower extremity 3/5 proximal and 2/5 distally. Sensation, coordination not corporative and gait not tested.  Patient neurologically improving, MRI today showed large left MCA infarct with minimal midline shift, will DC 3% saline.  However, MRA head suspicious for left MCA stent occlusion.  We will continue aspirin and Brilinta and high-dose Lipitor at this time.  Patient increased p.o. intake, will change to feeding to nocturnal tube feeding and encourage p.o. intake.  Will have dietitian consult for calorie count.  Continue Keppra, increase losartan dose to twice daily for BP management.  Elevated WBC will check UA.   For detailed assessment and plan, please refer to above as I have made changes wherever appropriate.   Rosalin Hawking, MD PhD Stroke Neurology 10/30/2020 6:41 PM  This patient is critically ill due to large left MCA stroke, cerebral edema, status post MCA stenting, questionable seizure and at significant risk of neurological worsening, death form brain herniation, status epilepticus, aspiration pneumonia. This patient's care requires constant monitoring of vital signs, hemodynamics, respiratory and cardiac monitoring, review of  multiple databases, neurological assessment, discussion with family, other specialists and medical decision making of high complexity. I spent 40 minutes of neurocritical care time in the care of this patient. I had long discussion with son at bedside, updated pt current condition, treatment plan and potential prognosis, and answered all the questions.  He expressed understanding and appreciation.     To contact Stroke Continuity provider, please refer to [email or web address] After hours, contact General Neurology

## 2020-10-30 NOTE — Progress Notes (Signed)
Inpatient Rehab Admissions Coordinator:  Received insurance authorization. Awaiting medical clearance and bed availability. Will continue to follow.   Amadou Katzenstein Graves Madden, MS, CCC-SLP Admissions Coordinator [phone]  

## 2020-10-31 LAB — CBC
HCT: 39.6 % (ref 36.0–46.0)
Hemoglobin: 13.1 g/dL (ref 12.0–15.0)
MCH: 29 pg (ref 26.0–34.0)
MCHC: 33.1 g/dL (ref 30.0–36.0)
MCV: 87.8 fL (ref 80.0–100.0)
Platelets: 220 10*3/uL (ref 150–400)
RBC: 4.51 MIL/uL (ref 3.87–5.11)
RDW: 13.5 % (ref 11.5–15.5)
WBC: 10.8 10*3/uL — ABNORMAL HIGH (ref 4.0–10.5)
nRBC: 0 % (ref 0.0–0.2)

## 2020-10-31 LAB — BASIC METABOLIC PANEL
Anion gap: 10 (ref 5–15)
BUN: 21 mg/dL (ref 8–23)
CO2: 23 mmol/L (ref 22–32)
Calcium: 9.8 mg/dL (ref 8.9–10.3)
Chloride: 119 mmol/L — ABNORMAL HIGH (ref 98–111)
Creatinine, Ser: 0.68 mg/dL (ref 0.44–1.00)
GFR, Estimated: >60 mL/min (ref >60)
Glucose, Bld: 145 mg/dL — ABNORMAL HIGH (ref 70–99)
Potassium: 3.1 mmol/L — ABNORMAL LOW (ref 3.5–5.1)
Sodium: 152 mmol/L — ABNORMAL HIGH (ref 135–145)

## 2020-10-31 LAB — GLUCOSE, CAPILLARY
Glucose-Capillary: 147 mg/dL — ABNORMAL HIGH (ref 70–99)
Glucose-Capillary: 112 mg/dL — ABNORMAL HIGH (ref 70–99)
Glucose-Capillary: 140 mg/dL — ABNORMAL HIGH (ref 70–99)
Glucose-Capillary: 122 mg/dL — ABNORMAL HIGH (ref 70–99)
Glucose-Capillary: 121 mg/dL — ABNORMAL HIGH (ref 70–99)
Glucose-Capillary: 169 mg/dL — ABNORMAL HIGH (ref 70–99)

## 2020-10-31 LAB — URINALYSIS, COMPLETE (UACMP) WITH MICROSCOPIC
Bilirubin Urine: NEGATIVE
Glucose, UA: NEGATIVE mg/dL
Hgb urine dipstick: NEGATIVE
Ketones, ur: NEGATIVE mg/dL
Leukocytes,Ua: NEGATIVE
Nitrite: NEGATIVE
Protein, ur: NEGATIVE mg/dL
Specific Gravity, Urine: 1.018 (ref 1.005–1.030)
pH: 6 (ref 5.0–8.0)

## 2020-10-31 MED ORDER — POTASSIUM CHLORIDE CRYS ER 20 MEQ PO TBCR: 40.0000 meq | EXTENDED_RELEASE_TABLET | ORAL | Status: DC

## 2020-10-31 MED ORDER — PROSOURCE TF PO LIQD
45.0000 mL | Freq: Two times a day (BID) | ORAL | Status: DC
  Administered 2020-10-31 – 2020-11-01 (×2): 45 mL
  Filled 2020-10-31 (×2): qty 45

## 2020-10-31 MED ORDER — POTASSIUM CHLORIDE 20 MEQ PO PACK
40.0000 meq | PACK | ORAL | Status: AC
  Administered 2020-10-31 (×2): 40 meq
  Filled 2020-10-31 (×2): qty 2

## 2020-10-31 NOTE — Progress Notes (Signed)
Inpatient Rehab Admissions Coordinator:  Pt not medically ready to admit to CIR.  Will continue to follow.   Gayland Curry, [place], [place] Admissions Coordinator [phone]

## 2020-10-31 NOTE — H&P (Incomplete)
Physical Medicine and Rehabilitation Admission H&P    Chief Complaint  Patient presents with  . Altered Mental Status  : HPI: Brittany Griffith is a 70 year old right-handed limited English speaking female with history of hyperlipidemia as well as hypertension.  Per chart review lives with spouse and independent prior to admission.  1 level home one-step to entry.  Presented 10/23/2020 with right side weakness, headache and slurred speech.  Noted blood pressure 200/77.  CT angiogram of head and neck showed left frontal parafalcine mass with extensive regional hyperostosis possibly reflecting en plaque meningioma.  Mild edema in left frontal lobe.  Trace local rightward midline shift.  No large vessel occlusion.  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  2.6 cm enhancing left frontal parafalcine mass consistent with meningioma minimal rightward midline shift.  Neurosurgery Dr. Venetia Constable follow-up in regards to meningioma felt to be slow-growing and would follow-up outpatient after initial evaluation by neurology services for CVA.  EEG negative for seizure and maintained on Keppra for seizure prophylaxis..  Echocardiogram with ejection fraction of 60 to 65% no wall motion abnormalities.  Patient underwent cerebral angiogram 10/26/2020 followed by stent assisted angioplasty of left MCA with  TTICI revascularization per interventional radiology.  Currently maintained on aspirin 81 mg daily and Brilinta 90 mg twice daily post stenting.  Patient was cleared to begin Lovenox for DVT prophylaxis.  She was maintained on 3% saline for her cerebral edema and monitoring of sodium levels.  On 10/30/2020 patient with questionable increasing weakness right upper extremity MRI/MRA completed showing considerable extension of the left middle cerebral artery territory infarction involving the vast majority of the cortical and subcortical distribution of the left middle cerebral  artery with sparing of the basal ganglia and patient remained on aspirin as well as Brilinta..  Currently on a dysphagia #1 honey thick liquid diet with nasogastric tube feeds for nutritional support.  Therapy evaluations completed due to patient decreased functional ability, right side weakness and slurred speech was admitted for a comprehensive rehab program.  Review of Systems  Constitutional: Negative for chills and fever.  HENT: Negative for hearing loss.   Eyes: Negative for blurred vision and double vision.  Respiratory: Negative for cough and shortness of breath.   Cardiovascular: Negative for chest pain, palpitations and leg swelling.  Gastrointestinal: Positive for constipation. Negative for heartburn, nausea and vomiting.  Genitourinary: Negative for dysuria, flank pain and hematuria.  Musculoskeletal: Positive for joint pain and myalgias.  Skin: Negative for rash.  Neurological: Positive for speech change and weakness.  All other systems reviewed and are negative.  Past Medical History:  Diagnosis Date  . Hypercholesteremia   . Hypertension   . Osteopenia    Past Surgical History:  Procedure Laterality Date  . RADIOLOGY WITH ANESTHESIA Left 10/26/2020   Procedure: RADIOLOGY WITH ANESTHESIA LEFT MCA STENT;  Surgeon: Luanne Bras, MD;  Location: [facility];  Service: Radiology;  Laterality: Left;   Family History  Problem Relation Age of Onset  . Stroke Father   . Heart attack Father   . Kidney disease Brother    Social History:  reports that she has never smoked. She has never used smokeless tobacco. No history on file for alcohol use and drug use. Allergies:  Allergies  Allergen Reactions  . Penicillins     unk reaction   Medications Prior to Admission  Medication Sig Dispense Refill  . Multiple Vitamins-Minerals (MULTIVITAMIN WITH MINERALS) tablet Take  1 tablet by mouth daily.      Drug Regimen Review { DRUG REGIMEN [id number]}  Home: Home  Living Family/patient expects to be discharged to:: Private residence Living Arrangements: Spouse/significant other,Children Available Help at Discharge: Family,Available 24 hours/day Type of Home: House Home Access: Stairs to enter [employer] of Steps: 1 Entrance Stairs-Rails: None Home Layout: One level Bathroom Shower/Tub: Art therapist: Standard Bathroom Accessibility: Yes Home Equipment: None Additional Comments: family reports spouse was working but not plans to stay with pt  Lives With: Spouse   Functional History: Prior Function Level of Independence: Independent 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 Mod A to bring BLEs over EOB and then elevate trunk. Transfers Overall transfer level: Needs assistance Equipment used: 2 person hand held assist Transfers: Sit to/from Stand Sit to Stand: Min assist,+2 physical assistance,+2 safety/equipment General transfer comment: Min A +2 to power up into standing and maintain balance blocking at R knee to maintain Ambulation/Gait Ambulation/Gait assistance: Max assist,Mod assist,+2 physical assistance Gait Distance (Feet): 3 Feet (+ 3ft + 8 ft) Assistive device: 2 person hand held assist Gait Pattern/deviations: Step-to pattern,Decreased step length - left,Decreased stance time - right,Decreased weight shift to right General Gait Details: pt relying on +2 to steady, blocking of bilateral knees with steps. progressively less blocking needed in RLE, may be able to progress to RW with hand-over-hand assist  for grip Gait velocity: decr Gait velocity interpretation: <1.31 ft/sec, indicative of household ambulator    ADL: ADL Overall ADL's : Needs assistance/impaired Eating/Feeding: Moderate assistance,Sitting Eating/Feeding Details (indicate cue type and reason): Mod A for bilateral coorindation due to decreased functional use of RUE. pt using spoon to scoop ice cream and bring to mouth Grooming: Wash/dry hands,Wash/dry face,Moderate assistance Grooming Details (indicate cue type and reason): standing at sink able to engage in washing hands with min assist for task sequencing/R hand attention, min-mod assist for balance and perseverating on washing face (bed level continues to perseverate on washing face and requied max assist to wash hands) Toilet Transfer: +2 for physical assistance,RW,Minimal assistance,Ambulation (simualted to recliner) Functional mobility during ADLs: Moderate assistance,+2 for physical assistance,+2 for safety/equipment,Cueing for sequencing,Maximal assistance General ADL Comments: Pt demonsatrating high motivation. Performing functional mobility with Mod-Max A +2 and then participating in self feeding while sitting in recliner  Cognition: Cognition Overall Cognitive Status: Difficult to assess Arousal/Alertness: Awake/alert Orientation Level: Oriented to person Attention: Sustained Sustained Attention: Appears intact Memory:  (unable to assess secondary to aphasia) Awareness: Impaired Awareness Impairment: Intellectual impairment Problem Solving: Impaired Problem Solving Impairment: Functional basic Executive Function: Sequencing Sequencing: Impaired Sequencing Impairment: Functional basic Cognition Arousal/Alertness: Awake/alert Behavior During Therapy: Flat affect (smiling with therapists) Overall Cognitive Status: Difficult to assess Area of Impairment: Attention,Problem solving,Awareness,Following commands,Safety/judgement Orientation Level: Disoriented  to,Place,Person (did not question time or situation) Current Attention Level: Sustained Memory: Decreased recall of precautions,Decreased short-term memory Following Commands: Follows one step commands inconsistently,Follows one step commands with increased time Safety/Judgement: Decreased awareness of deficits,Decreased awareness of safety Awareness: Intellectual Problem Solving: Slow processing,Decreased initiation,Difficulty sequencing,Requires verbal cues,Requires tactile cues General Comments: Difficult to assess due to decreased verbalization and communication. Pt  following simple, one step commands ~80% of time. Very motiavted to particiapte in therapy. Requiring increased time Difficult to assess due to: Impaired communication  Physical Exam: Blood pressure (!) 161/79, pulse 84, temperature 99.1 F (37.3 C), temperature source Oral, resp. rate 17, height 5\' 2"  (1.575 m), weight 57.8 kg, SpO2 97 %. Physical Exam Neurological:     Comments: Patient is alert no acute distress.  Makes eye contact with examiner.  She is aphasic with language barrier.  Follows simple demonstrated commands.     Results for orders placed or performed during the hospital encounter of 10/23/20 (from the past 48 hour(s))  Glucose, capillary     Status: Abnormal   Collection Time: 10/29/20  7:56 AM  Result Value Ref Range   Glucose-Capillary 143 (H) 70 - 99 mg/dL    Comment: Glucose reference range applies only to samples taken after fasting for at least 8 hours.  Sodium     Status: Abnormal   Collection Time: 10/29/20 11:29 AM  Result Value Ref Range   Sodium 156 (H) 135 - 145 mmol/L    Comment: Performed at [facility] [address], [place], [place] [address]  Glucose, capillary     Status: Abnormal   Collection Time: 10/29/20 12:40 PM  Result Value Ref Range   Glucose-Capillary 131 (H) 70 - 99 mg/dL    Comment: Glucose reference range applies only to samples taken after fasting for at least  8 hours.  Glucose, capillary     Status: Abnormal   Collection Time: 10/29/20  4:26 PM  Result Value Ref Range   Glucose-Capillary 149 (H) 70 - 99 mg/dL    Comment: Glucose reference range applies only to samples taken after fasting for at least 8 hours.  Sodium     Status: Abnormal   Collection Time: 10/29/20  5:48 PM  Result Value Ref Range   Sodium 154 (H) 135 - 145 mmol/L    Comment: Performed at [facility] [address] [place], [place] [address]  Magnesium     Status: None   Collection Time: 10/29/20  5:48 PM  Result Value Ref Range   Magnesium 2.0 1.7 - 2.4 mg/dL    Comment: Performed at [facility], [place] [address] [place], [place] [address]  Phosphorus     Status: None   Collection Time: 10/29/20  5:48 PM  Result Value Ref Range   Phosphorus 2.8 2.5 - 4.6 mg/dL    Comment: Performed at [facility] [address], [place], [place] [address]  Glucose, capillary     Status: Abnormal   Collection Time: 10/29/20  7:40 PM  Result Value Ref Range   Glucose-Capillary 128 (H) 70 - 99 mg/dL    Comment: Glucose reference range applies only to samples taken after fasting for at least 8 hours.  Sodium     Status: Abnormal   Collection Time: 10/29/20 10:46 PM  Result Value Ref Range   Sodium 154 (H) 135 - 145 mmol/L    Comment: Performed at [facility], [place] [address], [place], [place] [address]  Glucose, capillary     Status: Abnormal   Collection Time: 10/29/20 10:57 PM  Result Value Ref Range   Glucose-Capillary 142 (H) 70 - 99 mg/dL    Comment: Glucose reference range applies only to samples taken after fasting for at least 8 hours.  Magnesium     Status: None   Collection Time:  10/30/20  1:54 AM  Result Value Ref Range   Magnesium 2.0 1.7 - 2.4 mg/dL    Comment: Performed at [facility] [address], [place], [place] [address]  Phosphorus     Status: None   Collection Time: 10/30/20  1:54 AM  Result Value Ref Range   Phosphorus  3.3 2.5 - 4.6 mg/dL    Comment: Performed at [facility] [address], [place], [place] [address]  Basic metabolic panel     Status: Abnormal   Collection Time: 10/30/20  1:54 AM  Result Value Ref Range   Sodium 155 (H) 135 - 145 mmol/L   Potassium 3.0 (L) 3.5 - 5.1 mmol/L   Chloride 120 (H) 98 - 111 mmol/L   CO2 20 (L) 22 - 32 mmol/L   Glucose, Bld 125 (H) 70 - 99 mg/dL    Comment: Glucose reference range applies only to samples taken after fasting for at least 8 hours.   BUN 17 8 - 23 mg/dL   Creatinine, Ser 0.64 0.44 - 1.00 mg/dL   Calcium 9.4 8.9 - 10.3 mg/dL   GFR, Estimated >60 >60 mL/min    Comment: (NOTE) Calculated using the CKD-EPI Creatinine Equation (2021)    Anion gap 15 5 - 15    Comment: Performed at [facility] [address], [place], [place] [address]  CBC     Status: Abnormal   Collection Time: 10/30/20  1:54 AM  Result Value Ref Range   WBC 12.7 (H) 4.0 - 10.5 K/uL   RBC 3.96 3.87 - 5.11 MIL/uL   Hemoglobin 11.6 (L) 12.0 - 15.0 g/dL   HCT 34.2 (L) 36.0 - 46.0 %   MCV 86.4 80.0 - 100.0 fL   MCH 29.3 26.0 - 34.0 pg   MCHC 33.9 30.0 - 36.0 g/dL   RDW 13.4 11.5 - 15.5 %   Platelets 188 150 - 400 K/uL   nRBC 0.0 0.0 - 0.2 %    Comment: Performed at [facility], [place] [address], [place], [place] [address]  Glucose, capillary     Status: Abnormal   Collection Time: 10/30/20  3:28 AM  Result Value Ref Range   Glucose-Capillary 121 (H) 70 - 99 mg/dL    Comment: Glucose reference range applies only to samples taken after fasting for at least 8 hours.  Glucose, capillary     Status: Abnormal   Collection Time: 10/30/20  7:40 AM  Result Value Ref Range   Glucose-Capillary 133 (H) 70 - 99 mg/dL    Comment: Glucose reference range applies only to samples taken after fasting for at least 8 hours.  Sodium     Status: Abnormal   Collection Time: 10/30/20 10:56 AM  Result Value Ref Range   Sodium 156 (H) 135 - 145 mmol/L    Comment: Performed  at [facility] [address], [place], [place] [address]  Glucose, capillary     Status: Abnormal   Collection Time: 10/30/20 11:23 AM  Result Value Ref Range   Glucose-Capillary 102 (H) 70 - 99 mg/dL    Comment: Glucose reference range applies only to samples taken after fasting for at least 8 hours.  Glucose, capillary     Status: None   Collection Time: 10/30/20  3:28 PM  Result Value Ref Range   Glucose-Capillary 98 70 - 99 mg/dL    Comment: Glucose reference range applies only to samples taken after fasting  for at least 8 hours.  Sodium     Status: Abnormal   Collection Time: 10/30/20  4:47 PM  Result Value Ref Range   Sodium 155 (H) 135 - 145 mmol/L    Comment: Performed at [facility], [place] [address], [place], [place] [address]  Glucose, capillary     Status: Abnormal   Collection Time: 10/30/20  7:14 PM  Result Value Ref Range   Glucose-Capillary 111 (H) 70 - 99 mg/dL    Comment: Glucose reference range applies only to samples taken after fasting for at least 8 hours.  Glucose, capillary     Status: None   Collection Time: 10/30/20 11:05 PM  Result Value Ref Range   Glucose-Capillary 94 70 - 99 mg/dL    Comment: Glucose reference range applies only to samples taken after fasting for at least 8 hours.  Glucose, capillary     Status: Abnormal   Collection Time: 10/31/20  3:23 AM  Result Value Ref Range   Glucose-Capillary 147 (H) 70 - 99 mg/dL    Comment: Glucose reference range applies only to samples taken after fasting for at least 8 hours.   MR ANGIO HEAD WO CONTRAST  Result Date: 10/30/2020 CLINICAL DATA:  Follow-up left MCA angioplasty. Worsening weakness in the right upper extremity. EXAM: MRI HEAD WITHOUT CONTRAST MRA HEAD WITHOUT CONTRAST TECHNIQUE: Multiplanar, multiecho pulse sequences of the brain and surrounding structures were obtained without intravenous contrast. Angiographic images of the head were obtained using MRA technique without contrast.  COMPARISON:  MRI 10/24/2020. Subsequent CT and interventional studies. FINDINGS: MRI HEAD FINDINGS Brain: Considerable extension of the left middle cerebral artery territory infarction, now involving the vast majority of the cortical and subcortical distribution of the left middle cerebral artery, with sparing of the basal ganglia. Mild swelling. Petechial blood products without frank hematoma. Vascular: Not evaluated using requested technique. Skull and upper cervical spine: Not evaluated using requested technique. Sinuses/Orbits: Not evaluated using requested technique. Other: None MRA HEAD FINDINGS The study suffers from considerable motion degradation. Right internal carotid artery is patent through the skull base and siphon region. Atherosclerotic narrowing of the siphon, anterior cerebral and middle cervical branches. Left internal carotid artery is patent through the siphon region but without atherosclerotic narrowing. There is a stent in the left middle cerebral artery which causes signal loss. Some flow can be appreciated in the more distal left MCA branches. Both vertebral arteries patent to the basilar. Limited detail of the more distal branches. IMPRESSION: 1. Considerable extension of the left middle cerebral artery territory infarction, now involving the vast majority of the cortical and subcortical distribution of the left middle cerebral artery, with sparing of the basal ganglia. Petechial blood products without frank hematoma. 2. Motion degraded MR angiography. Signal loss related 2 left MCA stent. Flow does appear to be present in the more distal left MCA branch vessels presently. Electronically Signed   By: Nelson Chimes M.D.   On: 10/30/2020 12:58   MR BRAIN WO CONTRAST  Result Date: 10/30/2020 CLINICAL DATA:  Follow-up left MCA angioplasty. Worsening weakness in the right upper extremity. EXAM: MRI HEAD WITHOUT CONTRAST MRA HEAD WITHOUT CONTRAST TECHNIQUE: Multiplanar, multiecho pulse  sequences of the brain and surrounding structures were obtained without intravenous contrast. Angiographic images of the head were obtained using MRA technique without contrast. COMPARISON:  MRI 10/24/2020. Subsequent CT and interventional studies. FINDINGS: MRI HEAD FINDINGS Brain: Considerable extension of the left middle cerebral artery territory infarction, now involving  the vast majority of the cortical and subcortical distribution of the left middle cerebral artery, with sparing of the basal ganglia. Mild swelling. Petechial blood products without frank hematoma. Vascular: Not evaluated using requested technique. Skull and upper cervical spine: Not evaluated using requested technique. Sinuses/Orbits: Not evaluated using requested technique. Other: None MRA HEAD FINDINGS The study suffers from considerable motion degradation. Right internal carotid artery is patent through the skull base and siphon region. Atherosclerotic narrowing of the siphon, anterior cerebral and middle cervical branches. Left internal carotid artery is patent through the siphon region but without atherosclerotic narrowing. There is a stent in the left middle cerebral artery which causes signal loss. Some flow can be appreciated in the more distal left MCA branches. Both vertebral arteries patent to the basilar. Limited detail of the more distal branches. IMPRESSION: 1. Considerable extension of the left middle cerebral artery territory infarction, now involving the vast majority of the cortical and subcortical distribution of the left middle cerebral artery, with sparing of the basal ganglia. Petechial blood products without frank hematoma. 2. Motion degraded MR angiography. Signal loss related 2 left MCA stent. Flow does appear to be present in the more distal left MCA branch vessels presently. Electronically Signed   By: Nelson Chimes M.D.   On: 10/30/2020 12:58       Medical Problem List and Plan: 1.  Right side weakness with  aphasia/dysphagia secondary to left MCA territory infarction due to severe left M1 stenosis status post stenting 10/26/2020  -patient may *** shower  -ELOS/Goals: *** 2.  Antithrombotics: -DVT/anticoagulation: Lovenox  -antiplatelet therapy: Aspirin 81 mg daily and Brilinta 90 mg twice daily 3. Pain Management: Tylenol as needed 4. Mood: Provide emotional support  -antipsychotic agents: N/A 5. Neuropsych: This patient is not capable of making decisions on her own behalf. 6. Skin/Wound Care: Routine skin checks 7. Fluids/Electrolytes/Nutrition: Routine in and outs with follow-up chemistries 8.  Dysphagia.  Dysphagia #1 honey thick liquids.  Nasogastric tube for nutritional support.  Dietary follow-up.  Speech therapy follow-up 9.  Hypertension.  Norvasc 10 mg daily, Cozaar 50 mg twice daily.  Monitor with increased mobility 10.  Seizure prophylaxis.  Keppra 500 mg twice daily.  EEG negative 11.  Hyperlipidemia.  Lipitor 12.  Incidental findings meningioma.  Follow-up outpatient neurosurgery Dr. Venetia Constable 13.  Hyperglycemia related to tube feeds.  SSI ***  Cathlyn Parsons, PA-C 10/31/2020

## 2020-10-31 NOTE — Progress Notes (Addendum)
STROKE TEAM PROGRESS NOTE   INTERVAL HISTORY No acute events overnight.  daughter at bedside.   Afebrile. BP better controlled. Eyes open, global aphasia, however able to close and open eyes on request, not able to follow other simple commands.  Nonverbal.  Not able to name or repeat.  No gaze preference, tracking bilaterally to voice.  Blinking to visual threat on the left consistently, however inconsistent on the right.  Right facial droop.  Right upper extremity 0/5 with increased muscle tone, right lower extremity 3/5 proximal and 2/5 distally. Sensation, coordination not corporative and gait not tested.   Vitals:   10/31/20 0940 10/31/20 1000 10/31/20 1100 10/31/20 1144  BP: 132/64 (!) 148/59 (!) 164/62   Pulse: 95 80 84   Resp: 19 (!) 22 (!) 22   Temp:    98.9 F (37.2 C)  TempSrc:    Oral  SpO2: 98% 97% 96%   Weight:      Height:       CBC:  Recent Labs  Lab 10/27/20 0624 10/29/20 0243 10/30/20 0154 10/31/20 0604  WBC 10.8*   < > 12.7* 10.8*  NEUTROABS 7.5  --   --   --   HGB 10.9*   < > 11.6* 13.1  HCT 31.6*   < > 34.2* 39.6  MCV 83.6   < > 86.4 87.8  PLT 202   < > 188 220   < > = values in this interval not displayed.   Basic Metabolic Panel:  Recent Labs  Lab 10/29/20 1748 10/29/20 2246 10/30/20 0154 10/30/20 1056 10/30/20 1647 10/31/20 0604  NA 154*   < > 155*   < > 155* 152*  K  --   --  3.0*  --   --  3.1*  CL  --   --  120*  --   --  119*  CO2  --   --  20*  --   --  23  GLUCOSE  --   --  125*  --   --  145*  BUN  --   --  17  --   --  21  CREATININE  --   --  0.64  --   --  0.68  CALCIUM  --   --  9.4  --   --  9.8  MG 2.0  --  2.0  --   --   --   PHOS 2.8  --  3.3  --   --   --    < > = values in this interval not displayed.   Lipid Panel:  Recent Labs  Lab 10/28/20 0206  TRIG 85   HgbA1c:  No results for input(s): HGBA1C in the last 168 hours. Urine Drug Screen:  No results for input(s): LABOPIA, COCAINSCRNUR, LABBENZ, AMPHETMU, THCU,  LABBARB in the last 168 hours.  IMAGING past 24 hours  MRI BRAIN WO 10/24/2020 Acute infarct in the left MCA territory with significant progression since yesterday. This involves the insula and deep white matter with small areas of cortical infarct. 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 No associated brain edema.  CT HEAD WO CONTRAST  Result Date: 10/24/2020 1. Appearance suspicious for new Left PCA territory ischemia in the occipital pole. However, this might be streak artifact related to the complex left skull lesion.  2.  Cytotoxic edema now evident at the posterior left insula and mildly larger than the DWI lesion yesterday.  3. Stable brain otherwise. No acute hemorrhage or increased intracranial mass effect. 4. Stable complex left frontal convexity meningioma(s) and skull hyperostosis.   MR BRAIN WO CONTRAST  Result Date: 10/24/2020 IMPRESSION: Acute infarct in the left MCA territory with significant progression since yesterday. This involves the insula and deep white matter with small areas of cortical infarct. 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 No associated brain edema.   MR Brain W and Wo Contrast  Result Date: 10/23/2020 IMPRESSION:  1. 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  2. 2.6 cm enhancing left frontal parafalcine mass consistent with a meningioma with mild edema and minimal rightward midline shift. This is associated with extensive dural ossification and left frontal skull hyperostosis along the anterior and lateral cerebral convexities. 3. Smaller meningiomas along the falx in the  posterior left frontal region and laterally in the left sylvian fissure.  4. 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  CTA HEAD/NECK IMPRESSION: 1. 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 Mild edema in the left frontal lobe. Trace local rightward midline shift. Brain MRI without and with contrast is recommended for further evaluation. 2. No evidence of an acute infarct or intracranial hemorrhage. 3. No large vessel occlusion. 4. Intracranial atherosclerosis including moderate right and severe left M1 stenoses and moderate bilateral P2 stenoses. 5. Widely patent cervical carotid and vertebral arteries. 6. 2.3 cm right thyroid nodule. Recommend thyroid US (ref: J Am Coll  EEG adult Result Date: 10/23/2020 IMPRESSION: This study is suggestive of cortical dysfunction in left temporal region which is non specific but could be secondary to underlying structural abnormality. No seizures or epileptiform discharges were seen throughout the recording. [name]   CTA head Result date 10/25/2020 Evolving recent left MCA territory infarctions. No acute intracranial hemorrhage or significant mass effect.  Similar appearance of intracranial atherosclerosis compared to recent prior study.  PHYSICAL EXAM Physical Exam  HEENT-  Skull asymmetry due to hyperostosis. No trauma seen.    Lungs- Respirations unlabored Extremities- No edema  Neurological Examination  Mental Status:   Patient is awake. Will follow one step commands only. No verbal response. Does not answer any questions. Global aphasia. Cranial Nerves: II: Visual fields cannot be formally tested   III,IV, VI: EOMI  V,VII: Unable to formally test sensation, but reacts to touch bilaterally. Right facial droop is prominent when smiling.  VIII: Hearing intact to some commands.  IX,X: Unable to assess. Not following  commands for mouth opening.  XI: Head is midline XII: Did not protrude tongue.  Motor: RUE 1/5 grip, biceps, triceps and deltoid. Drift present LUE 4+/5 grip, biceps, triceps and deltoid. RLE 4/5 hip and knee flexion LLE 4+/5 hip and knee flexion Sensory: Reacts to touch x 4 but does not answer questions regarding the intensity of the touch or temperature stimuli Cerebellar: ataxia on FN bilaterally Gait: Deferred due to falls risk concerns   ASSESSMENT/PLAN Ms. Brittany Griffith is a 70 y.o. female with history of hypertension who was found sitting on the floor slumped over, nonverbal and unable to follow commands, but awake, tracking with her eyes and crying.  Husband then lifted his wife  off the floor and noticed right-sided weakness and a right-sided facial droop. LKN was 0930. She continued to exhibit the same symptoms on EMS arrival to the home, with resolution approximately 20 minutes later.  MRI revealed small strokes scattered in the left MCA territory in the context of severe left M1 stenosis seen on CTA. Of note, she also has multiple left hemisphere meningiomas, the largest being associated with mild edema and minimal rightward shift.  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 Stent occlusion  CT head 3/13 no acute infarct    CTA head and neck 3/13 bilateral M1 high-grade stenosis and bilateral P2 moderate stenosis.    MRI 10/23/2020 showed 3 punctate infarcts at left insular cortex.  Neuro worsening with CT 3/14 showed extension of left MCA infarct.    MRI 3/14 confirmed left MCA infarct extension.    Repeat CTA head 3/15 similar bilateral M1 high-grade stenosis.    Status post left M1 stent on 10/26/2020.    However, patient symptoms continue to worsen with right hemiplegia, therefore 10/27/2020 CT repeat which showed left large MCA infarct.    MRI  3/20 large left MCA infarct  MRA signal loss related to left MCA stents, ? stent occlusion   No further intervention at this time. Plan to follow-up with Dr. Estanislado Pandy in clinic 2 weeks after discharge   EEG negative for seizure.    EF 60 to 65%.  LDL 159  HgbA1c 5.6  VTE prophylaxis - lovenox  No antithrombotic prior to admission. On ASA 81mg  daily and Brilinta 90 mg BID post stenting  Therapy recommendations: CIR  Disposition:  pending  Meningioma  CT head showed left frontal parafalcine mass with edema and midline shift, consistent with aggressive meningioma.  Neurosurgery consulted: No acute neurosurgical intervention indicated, Left frontal meningioma likely asymptomatic and longstanding can be followed conservatively and no need for neurosurgical treatment at the present time.  ? partial complex seizure  Brief episode of aphasia and right sided weakness at home  EEG: cortical dysfunction in left temporal region, No epileptiform discharges   On keppra 500mg  bid now  Cerebral Edema with minimal midline shift  Was on 3% saline, discontinued 3/20  Allow Na gradually trending down  Na 139->148->157->155->156->152  On TF and FW  Dysphagia  SLP mod barium: Dysphagia I diet and honey thick liquids with alternative means for medications recommended  Cortrak in place with TF  Nutrition consult for nocturnal tube feeds and calorie count  Osmolite 1.2 at 60 ml/h (600 ml per day) x 10 hours (8pm to 6am)  Encourage po intake  Free water 100 Q4h  Hypertension  Home meds:  None  Remains hypertensive, requiring prn IV antihypertensives  Norvasc 10mg  daily, Iosartan 50mg  bid  Keep systolic BP less than 263   Avoid hypotension   Long-term BP goal normotensive  Hyperlipidemia  Home meds:  none  LDL 159, goal < 70  Atorvastatin 80mg  daily  Continue statin at discharge  Hypokalemia  3.1->4.5->3.0->3.1  Replete with potassium chloride 30meq  packet  Check level in am   Leukocytosis   WBC 10.8->9.6->12.7->10.8  Repeat UA negative  Other Stroke Risk Factors  Advanced Age >/= 53   Other Active Problems  2.3 cm right thyroid nodule. Recommend PCP followup and thyroid US  Brittany Griffith, ACNP-BC Stroke NP 10/31/2020   ATTENDING NOTE: I reviewed above note  and agree with the assessment and plan. Pt was seen and examined.   Patient no acute event overnight, lying in bed, vitals stable.  BP under control.  Still has global aphasia, right arm plegic, however right lower extremity recovers well.  Sodium trending down gradually.  Continue nocturnal tube feeding, encourage p.o. intake.  PT/OT recommend inpatient rehab.  Patient will follow up with Dr. Estanislado Pandy as outpatient.  Continue aspirin and Brilinta and statin.  Potassium still low, status post supplement.  Leukocytosis improving, no fever, UA negative.  For detailed assessment and plan, please refer to above as I have made changes wherever appropriate.   Brittany Hawking, MD PhD Stroke Neurology 10/31/2020 6:58 PM      To contact Stroke Continuity provider, please refer to [email or web address] After hours, contact General Neurology

## 2020-10-31 NOTE — Progress Notes (Signed)
Physical Therapy Treatment Patient Details Name: Brittany Griffith MRN: [id number] DOB: Apr 17, 1951 Today's Date: 10/31/2020    History of Present Illness 70 y.o. female who presented to hospital with complaints of speech difficulty and right sided weakness but on EMS arrival symptoms had completely resolved. In ER later in the day, MD reported patient started to have difficulty with getting her words out. MRI revealed acute CVA involving L MCA in the L insula and parietal lobe. Abnormal neuro checks in AM (3/14); repeat CT repeated showing extension of f Left MCA infarct and cerebral edema with midline shift. S/p Left MCA angioplasty 3/16  by Dr. Jaquita Folds. PMH: HLD, HTN, osteopenia    PT Comments    The pt was able to demo progress with tolerance for transfers and OOB activity at this time. The session was limited by episodes of incontinence with each standing attempt, but the pt remained motivated and eager to continue with mobility after being cleaned up. The pt was able to complete multiple sit-stand with minA of 2 and blocking of R knee for stability, but demos good efforts to power up to stand. The pt was then able to complete 2 short bouts of gait in the room, but continues to require significant assist to complete wt shift and advancement of RLE for each step due to poor functional strength in hip flexor and quads. The pt will continue to benefit from skilled PT to progress functional mobility, strength, and dynamic stability.    Follow Up Recommendations  CIR     Equipment Recommendations  3in1 (PT) (defer to post acute)    Recommendations for Other Services       Precautions / Restrictions Precautions Precautions: Fall Precaution Comments: R neglect Restrictions Weight Bearing Restrictions: No    Mobility  Bed Mobility Overal bed mobility: Needs Assistance Bed Mobility: Rolling;Sidelying to Sit Rolling: Mod assist Sidelying to sit: Mod assist       General bed mobility  comments: modA to roll, pt able to complete lateral movement of BLE x1, needed minA on second attempt to move. modA to elevate trunk from elevated HOB    Transfers Overall transfer level: Needs assistance Equipment used: 2 person hand held assist Transfers: Sit to/from [facility] Sit to Stand: +2 physical assistance;+2 safety/equipment;Min assist Stand pivot transfers: Mod assist;+2 safety/equipment;+2 physical assistance       General transfer comment: minA to power up, blocking R knee pt able to maintain with BUE support and blocking to R knee  Ambulation/Gait Ambulation/Gait assistance: Max assist;Mod assist;+2 physical assistance Gait Distance (Feet): 4 Feet (+ 8 ft) Assistive device: 2 person hand held assist Gait Pattern/deviations: Step-to pattern;Decreased step length - left;Decreased stance time - right;Decreased weight shift to right Gait velocity: decr Gait velocity interpretation: <1.31 ft/sec, indicative of household ambulator General Gait Details: pt relying on +2 to steady, blocking of R knee with stance, modA to advance RLE with each step but pt able to initiate hip flexion      Modified Rankin (Stroke Patients Only) Modified Rankin (Stroke Patients Only) Pre-Morbid Rankin Score: No symptoms Modified Rankin: Moderately severe disability     Balance Overall balance assessment: Needs assistance Sitting-balance support: No upper extremity supported;Feet supported Sitting balance-Leahy Scale: Fair Sitting balance - Comments: can maintain static sitting without LE support, however, slight impuslivity today leaning outside BOS which requires minA to return to upright as pt is unable to initiate correction   Standing balance support: No upper extremity supported Standing balance-Leahy Scale:  Poor Standing balance comment: Reliant on physical A                            Cognition Arousal/Alertness: Awake/alert;Lethargic (initially  sleepy, improved alertness with mobility) Behavior During Therapy: Flat affect Overall Cognitive Status: Difficult to assess Area of Impairment: Attention;Problem solving;Awareness;Following commands;Safety/judgement                   Current Attention Level: Sustained     Safety/Judgement: Decreased awareness of deficits;Decreased awareness of safety Awareness: Intellectual Problem Solving: Slow processing;Decreased initiation;Difficulty sequencing;Requires verbal cues;Requires tactile cues General Comments: Difficult to assess due to decreased verbalization, consistent with yes/no when asked simple questions. Pt moving impulsively at times, but redirectable.      Exercises General Exercises - Lower Extremity Short Arc Quad: AROM;Both;5 reps;Seated    General Comments General comments (skin integrity, edema, etc.): family present and supportive      Pertinent Vitals/Pain Pain Assessment: No/denies pain Faces Pain Scale: No hurt Pain Location: suspect back Pain Descriptors / Indicators: Restless Pain Intervention(s): Monitored during session;Limited activity within patient's tolerance;Repositioned           PT Goals (current goals can now be found in the care plan section) Acute Rehab PT Goals Patient Stated Goal: pt did not state PT Goal Formulation: With patient Time For Goal Achievement: 11/12/20 Potential to Achieve Goals: Good Progress towards PT goals: Progressing toward goals    Frequency    Min 4X/week      PT Plan Current plan remains appropriate       AM-PAC PT "6 Clicks" Mobility   Outcome Measure  Help needed turning from your back to your side while in a flat bed without using bedrails?: A Lot Help needed moving from lying on your back to sitting on the side of a flat bed without using bedrails?: A Lot Help needed moving to and from a bed to a chair (including a wheelchair)?: A Lot Help needed standing up from a chair using your arms  (e.g., wheelchair or bedside chair)?: A Lot Help needed to walk in hospital room?: A Lot Help needed climbing 3-5 steps with a railing? : Total 6 Click Score: 11    End of Session Equipment Utilized During Treatment: Gait belt Activity Tolerance: Patient tolerated treatment well;Patient limited by fatigue Patient left: in chair;with call bell/phone within reach;with chair alarm set Nurse Communication: Mobility status PT Visit Diagnosis: Unsteadiness on feet (R26.81);Other abnormalities of gait and mobility (R26.89);Apraxia (R48.2)     Time: 2542-7062 PT Time Calculation (min) (ACUTE ONLY): 42 min  Charges:  $Gait Training: 8-22 mins $Therapeutic Activity: 8-22 mins $Neuromuscular Re-education: 8-22 mins                     Karma Ganja, PT, DPT   Acute Rehabilitation Department Pager #: [phone]   Otho Bellows 10/31/2020, 12:44 PM

## 2020-10-31 NOTE — Progress Notes (Addendum)
Nutrition Follow-up  DOCUMENTATION CODES:   Not applicable  INTERVENTION:   Tube feeding via Cortrak tube: Osmolite 1.2 at 60 ml/h (600 ml per day) x 10 hours (8pm to 6am) Prosource TF 45 ml daily  Provides 800 kcal, 55 gm protein, 486 ml free water daily  100 ml free water every 4 hours Total free water: 1086 ml free water   Encourage PO intake at meals  Adjust TF as PO intake improves   48 hour Calorie Count  NUTRITION DIAGNOSIS:   Inadequate oral intake related to dysphagia as evidenced by meal completion < 50% (altered texture diet). Ongoing.   GOAL:   Patient will meet greater than or equal to 90% of their needs Progressing.   MONITOR:   PO intake,TF tolerance  REASON FOR ASSESSMENT:   Consult Enteral/tube feeding initiation and management  ASSESSMENT:   Pt with PMH of HTN and HLD admitted with L MCA d/t severe L M1 stenosis s/p IR for angioplasty and stent.    Pt discussed during ICU rounds and with RN. Pt seen during SLP visit. Pt remained aphasic during my visit. Per RN pt will eat/drink what is presented and tends to eat/drink foods and beverages quickly.   3/16 cortrak placed for medications; tip gastric  3/18 diet advanced to Dysphagia 1 with Honey thickened liquids  Medications reviewed and include: SSI, KCl, senokot-s Labs reviewed: Na 152 CBG's: 112-140      Diet Order:   Diet Order            DIET - DYS 1 Room service appropriate? No; Fluid consistency: Honey Thick  Diet effective now                 EDUCATION NEEDS:   No education needs have been identified at this time  Skin:  Skin Assessment: Reviewed RN Assessment  Last BM:  3/20  Height:   Ht Readings from Last 1 Encounters:  10/23/20 5\' 2"  (1.575 m)    Weight:   Wt Readings from Last 1 Encounters:  10/31/20 57.8 kg    Ideal Body Weight:  50 kg  BMI:  Body mass index is 23.31 kg/m.  Estimated Nutritional Needs:   Kcal:  1700-1900  Protein:  85-100  grams  Fluid:  > 1.7 L/day  Lockie Pares., RD, LDN, CNSC See AMiON for contact information

## 2020-10-31 NOTE — Progress Notes (Signed)
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 Estanislado Pandy. Patient awake and alert laying in bed. Husband and daughter at bedside. Aphasic but follows simple commands. Movement of all extremities with weakness of right side.   Allergies: Penicillins  Medications: Prior to Admission medications   Medication Sig Start Date End Date Taking? Authorizing Provider  Multiple Vitamins-Minerals (MULTIVITAMIN WITH MINERALS) tablet Take 1 tablet by mouth daily.   Yes [provider]     Vital Signs: BP (!) 148/59   Pulse 80   Temp 98.8 F (37.1 C) (Axillary)   Resp (!) 22   Ht 5\' 2"  (1.575 m)   Wt 127 lb 6.8 oz (57.8 kg)   SpO2 97%   BMI 23.31 kg/m   Physical Exam Vitals and nursing note reviewed.  Constitutional:      General: She is not in acute distress. Pulmonary:     Effort: Pulmonary effort is normal. No respiratory distress.  Skin:    General: Skin is warm and dry.  Neurological:     Mental Status: She is alert.     Comments: Alert and awake. Aphasic but follows simple commands. PERRL bilaterally. Minimal right facial droop. Moving left side spontaneously, RLE 4/5, RUE 1/5.     Imaging: CT HEAD WO CONTRAST  Addendum Date: 10/27/2020   ADDENDUM REPORT: 10/27/2020 15:45 ADDENDUM: Comment: The increased attenuation in the left middle cerebral artery is actually due to a stent in this vessel. Electronically Signed   By: Lowella Grip III M.D.   On: 10/27/2020 15:45   Result Date: 10/27/2020 CLINICAL DATA:  Right upper extremity weakness EXAM: CT HEAD WITHOUT CONTRAST  TECHNIQUE: Contiguous axial images were obtained from the base of the skull through the vertex without intravenous contrast. COMPARISON:  October 26, 2020 FINDINGS: Brain: Compared to 1 day prior, there is now cytotoxic edema and decreased attenuation throughout much of the anterior and mid left temporal lobe as well as portions of the anterior left parietal lobe and a portion of the posterior aspect of the left frontal lobe. This edema causes relative effacement of the left lateral ventricle. There is now approximately 6 mm of midline shift to the right. Infarct is again noted involving the left external capsule, claustrum, extreme capsule as well as insular cortex anteriorly and midportion regions. Previous partially calcified meningioma in the medial left frontal region with extensive dural thickening/calcification in the left frontal region is stable consistent with meningioma. This appearance is stable. Calcification along the dura of the left anterior parietal lobe and anterior left temporal lobe is also stable. No new evident mass. No acute hemorrhage is evident. Vascular: There is a left middle cerebral artery hyperdense vessel consistent with occlusion of the left middle cerebral artery. There is calcification in each carotid siphon region. Skull: Bony calvarium appears intact. Meningioma changes on the left as noted above. Sinuses/Orbits: Mucosal thickening noted in the maxillary antra bilaterally with small air-fluid levels. Mucosal thickening noted in several ethmoid air cells. Orbits appear symmetric bilaterally. Other: Mastoid air cells clear. IMPRESSION: 1. Hyperdense vessel left middle cerebral artery consistent with occlusion of this vessel. 2. Extensive infarct throughout much of the left  temporal lobe, posterior left frontal lobe, anterior left parietal lobe consistent with evolving left middle cerebral artery distribution infarct. This infarct includes portions of the left external capsule,  claustrum, extreme capsule, and insular cortex. There is cytotoxic edema causing effacement of the left frontal horn and 6 mm of midline shift toward the right. Fourth ventricle midline. 3. Left frontal meningioma with extensive dural base calcification on the left persists without change. 4.  Foci of arterial vascular calcification noted. 5.  Areas of paranasal sinus disease noted. These results will be called to the ordering clinician or representative by the Radiologist Assistant, and communication documented in the PACS or [employer]. Electronically Signed: By: Lowella Grip III M.D. On: 10/27/2020 11:18   MR ANGIO HEAD WO CONTRAST  Result Date: 10/30/2020 CLINICAL DATA:  Follow-up left MCA angioplasty. Worsening weakness in the right upper extremity. EXAM: MRI HEAD WITHOUT CONTRAST MRA HEAD WITHOUT CONTRAST TECHNIQUE: Multiplanar, multiecho pulse sequences of the brain and surrounding structures were obtained without intravenous contrast. Angiographic images of the head were obtained using MRA technique without contrast. COMPARISON:  MRI 10/24/2020. Subsequent CT and interventional studies. FINDINGS: MRI HEAD FINDINGS Brain: Considerable extension of the left middle cerebral artery territory infarction, now involving the vast majority of the cortical and subcortical distribution of the left middle cerebral artery, with sparing of the basal ganglia. Mild swelling. Petechial blood products without frank hematoma. Vascular: Not evaluated using requested technique. Skull and upper cervical spine: Not evaluated using requested technique. Sinuses/Orbits: Not evaluated using requested technique. Other: None MRA HEAD FINDINGS The study suffers from considerable motion degradation. Right internal carotid artery is patent through the skull base and siphon region. Atherosclerotic narrowing of the siphon, anterior cerebral and middle cervical branches. Left internal carotid artery is patent through the siphon  region but without atherosclerotic narrowing. There is a stent in the left middle cerebral artery which causes signal loss. Some flow can be appreciated in the more distal left MCA branches. Both vertebral arteries patent to the basilar. Limited detail of the more distal branches. IMPRESSION: 1. Considerable extension of the left middle cerebral artery territory infarction, now involving the vast majority of the cortical and subcortical distribution of the left middle cerebral artery, with sparing of the basal ganglia. Petechial blood products without frank hematoma. 2. Motion degraded MR angiography. Signal loss related 2 left MCA stent. Flow does appear to be present in the more distal left MCA branch vessels presently. Electronically Signed   By: Nelson Chimes M.D.   On: 10/30/2020 12:58   MR BRAIN WO CONTRAST  Result Date: 10/30/2020 CLINICAL DATA:  Follow-up left MCA angioplasty. Worsening weakness in the right upper extremity. EXAM: MRI HEAD WITHOUT CONTRAST MRA HEAD WITHOUT CONTRAST TECHNIQUE: Multiplanar, multiecho pulse sequences of the brain and surrounding structures were obtained without intravenous contrast. Angiographic images of the head were obtained using MRA technique without contrast. COMPARISON:  MRI 10/24/2020. Subsequent CT and interventional studies. FINDINGS: MRI HEAD FINDINGS Brain: Considerable extension of the left middle cerebral artery territory infarction, now involving the vast majority of the cortical and subcortical distribution of the left middle cerebral artery, with sparing of the basal ganglia. Mild swelling. Petechial blood products without frank hematoma. Vascular: Not evaluated using requested technique. Skull and upper cervical spine: Not evaluated using requested technique. Sinuses/Orbits: Not evaluated using requested technique. Other: None MRA HEAD FINDINGS The study suffers from considerable motion degradation. Right internal carotid artery is patent through the skull  base and siphon region.  Atherosclerotic narrowing of the siphon, anterior cerebral and middle cervical branches. Left internal carotid artery is patent through the siphon region but without atherosclerotic narrowing. There is a stent in the left middle cerebral artery which causes signal loss. Some flow can be appreciated in the more distal left MCA branches. Both vertebral arteries patent to the basilar. Limited detail of the more distal branches. IMPRESSION: 1. Considerable extension of the left middle cerebral artery territory infarction, now involving the vast majority of the cortical and subcortical distribution of the left middle cerebral artery, with sparing of the basal ganglia. Petechial blood products without frank hematoma. 2. Motion degraded MR angiography. Signal loss related 2 left MCA stent. Flow does appear to be present in the more distal left MCA branch vessels presently. Electronically Signed   By: Nelson Chimes M.D.   On: 10/30/2020 12:58   DG Swallowing Func-Speech Pathology  Result Date: 10/28/2020 Objective Swallowing Evaluation: Type of Study: MBS-Modified Barium Swallow Study  Patient Details Name: Reece Mcbroom MRN: [id number] Date of Birth: Oct 01, 1950 Today's Date: 10/28/2020 Time: SLP Start Time (ACUTE ONLY): 1052 -SLP Stop Time (ACUTE ONLY): 1113 SLP Time Calculation (min) (ACUTE ONLY): 21 min Past Medical History: Past Medical History: Diagnosis Date . Hypercholesteremia  . Hypertension  . Osteopenia  Past Surgical History: Past Surgical History: Procedure Laterality Date . RADIOLOGY WITH ANESTHESIA Left 10/26/2020  Procedure: RADIOLOGY WITH ANESTHESIA LEFT MCA STENT;  Surgeon: Luanne Bras, MD;  Location: [place];  Service: Radiology;  Laterality: Left; HPI: Patient is a 70 y.o. female with PMH: HLD, HTN, osteopenia who presented to hospital with complaints of speech difficulty and right sided weakness but on EMS arrival symptoms had completely resolved. In ER later in the day, MD  reported patient started to have difficulty with getting her words out. 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 The next day (3/14) patient had abnormal neuro checks in AM and repeat MRI showed significant progression of acute infarct in the left MCA territory but negative for hemorrhagic transformation. On 3/16 pt underwent Patient underwent an image-guided cerebral arteriogram with revascularization of right MCA M1. Repeat head CT 3/17 revealed Hyperdense vessel left middle cerebral artery consistent with  occlusion of this vessel and extensive infarct throughout much of the left temporal lobe,  posterior left frontal lobe, anterior left parietal lobe consistent  with evolving left middle cerebral artery distribution infarct.  6 mm of midline  shift toward the right  Subjective: pleasant, alert husband in room Assessment / Plan / Recommendation CHL IP CLINICAL IMPRESSIONS 10/28/2020 Clinical Impression Pt's oral phase of dysphagia marked by holding due to apraxia , cognitive impairments and decreased manipulation resulting in delayed transit, lingual residue and piecemeal swallow pattern. Silent aspiration with nectar and thin liquid from impact of mildly decrease laryngeal elevation and minimally mistimed onset of laryngeal protection. Pharyngeal strength, contraction and epiglottic inversion were adequate. Pt has significant verbal apraxia and was unable to volitionally throat clear or cough and no sensory response to aspirate. Comprehension and cognitive deficits prevented compensatory strategies.There was no significant pharyngeal stasis. Recommend Dys 1 (puree) texture, honey thick liquids with full supervision and slow pace. Meds via Cortrak for now. Suspect intake will be slow to progress with prognosis is good. SLP Visit Diagnosis Dysphagia, oropharyngeal phase (R13.12) Attention and concentration deficit following -- Frontal lobe and executive function deficit  following -- Impact on safety and function Moderate aspiration risk;Mild aspiration risk   CHL IP  TREATMENT RECOMMENDATION 10/28/2020 Treatment Recommendations Therapy as outlined in treatment plan below   Prognosis 10/28/2020 Prognosis for Safe Diet Advancement Good Barriers to Reach Goals -- Barriers/Prognosis Comment -- CHL IP DIET RECOMMENDATION 10/28/2020 SLP Diet Recommendations Dysphagia 1 (Puree) solids;Honey thick liquids Liquid Administration via Cup Medication Administration Via alternative means Compensations Slow rate;Minimize environmental distractions;Small sips/bites Postural Changes Seated upright at 90 degrees   CHL IP OTHER RECOMMENDATIONS 10/28/2020 Recommended Consults -- Oral Care Recommendations Oral care BID Other Recommendations Order thickener from pharmacy   CHL IP FOLLOW UP RECOMMENDATIONS 10/28/2020 Follow up Recommendations Inpatient Rehab   CHL IP FREQUENCY AND DURATION 10/28/2020 Speech Therapy Frequency (ACUTE ONLY) min 2x/week Treatment Duration 2 weeks      CHL IP ORAL PHASE 10/28/2020 Oral Phase Impaired Oral - Pudding Teaspoon -- Oral - Pudding Cup -- Oral - Honey Teaspoon Delayed oral transit Oral - Honey Cup Delayed oral transit;Holding of bolus;Piecemeal swallowing Oral - Nectar Teaspoon Decreased bolus cohesion;Piecemeal swallowing Oral - Nectar Cup Decreased bolus cohesion;Piecemeal swallowing Oral - Nectar Straw -- Oral - Thin Teaspoon Lingual/palatal residue Oral - Thin Cup Lingual/palatal residue Oral - Thin Straw -- Oral - Puree Delayed oral transit Oral - Mech Soft -- Oral - Regular -- Oral - Multi-Consistency -- Oral - Pill -- Oral Phase - Comment --  CHL IP PHARYNGEAL PHASE 10/28/2020 Pharyngeal Phase Impaired Pharyngeal- Pudding Teaspoon -- Pharyngeal -- Pharyngeal- Pudding Cup -- Pharyngeal -- Pharyngeal- Honey Teaspoon WFL Pharyngeal -- Pharyngeal- Honey Cup Penetration/Aspiration during swallow;Reduced laryngeal elevation Pharyngeal Material enters airway, CONTACTS cords  and then ejected out Pharyngeal- Nectar Teaspoon Penetration/Aspiration during swallow;Reduced laryngeal elevation Pharyngeal Material enters airway, passes BELOW cords without attempt by patient to eject out (silent aspiration) Pharyngeal- Nectar Cup Reduced laryngeal elevation;Penetration/Aspiration before swallow;Penetration/Aspiration during swallow Pharyngeal Material enters airway, passes BELOW cords without attempt by patient to eject out (silent aspiration);Material enters airway, remains ABOVE vocal cords and not ejected out Pharyngeal- Nectar Straw -- Pharyngeal -- Pharyngeal- Thin Teaspoon Penetration/Aspiration during swallow;Reduced laryngeal elevation Pharyngeal Material enters airway, remains ABOVE vocal cords then ejected out Pharyngeal- Thin Cup Reduced laryngeal elevation;Penetration/Aspiration during swallow Pharyngeal Material enters airway, passes BELOW cords without attempt by patient to eject out (silent aspiration) Pharyngeal- Thin Straw -- Pharyngeal -- Pharyngeal- Puree WFL Pharyngeal -- Pharyngeal- Mechanical Soft -- Pharyngeal -- Pharyngeal- Regular -- Pharyngeal -- Pharyngeal- Multi-consistency -- Pharyngeal -- Pharyngeal- Pill -- Pharyngeal -- Pharyngeal Comment --  CHL IP CERVICAL ESOPHAGEAL PHASE 10/28/2020 Cervical Esophageal Phase WFL Pudding Teaspoon -- Pudding Cup -- Honey Teaspoon -- Honey Cup -- Nectar Teaspoon -- Nectar Cup -- Nectar Straw -- Thin Teaspoon -- Thin Cup -- Thin Straw -- Puree -- Mechanical Soft -- Regular -- Multi-consistency -- Pill -- Cervical Esophageal Comment -- Houston Siren 10/28/2020, 1:55 PM Orbie Pyo Litaker M.Ed CCC-SLP Speech-Language Pathologist Pager [phone] Office [phone]               Labs:  CBC: Recent Labs    10/27/20 0624 10/29/20 0243 10/30/20 0154 10/31/20 0604  WBC 10.8* 9.6 12.7* 10.8*  HGB 10.9* 10.3* 11.6* 13.1  HCT 31.6* 29.7* 34.2* 39.6  PLT 202 179 188 220    COAGS: Recent Labs    10/25/20 0224  10/26/20 1430  INR 1.1  --   APTT  --  >200*    BMP: Recent Labs    10/28/20 1727 10/29/20 0243 10/29/20 1129 10/30/20 0154 10/30/20 1056 10/30/20 1647 10/31/20 0604  NA 150* 157*   < >  155* 156* 155* 152*  K 3.1* 4.5  --  3.0*  --   --  3.1*  CL 124* >130*  --  120*  --   --  119*  CO2 18* 20*  --  20*  --   --  23  GLUCOSE 160* 182*  --  125*  --   --  145*  BUN 10 13  --  17  --   --  21  CALCIUM 8.7* 9.1  --  9.4  --   --  9.8  CREATININE 0.61 0.76  --  0.64  --   --  0.68  GFRNONAA >60 >60  --  >60  --   --  >60   < > = values in this interval not displayed.    LIVER FUNCTION TESTS: Recent Labs    10/23/20 1138 10/24/20 0448  BILITOT 1.0 0.6  AST 22 18  ALT 17 15  ALKPHOS 21 53  PROT 6.9 6.6  ALBUMIN 3.8 3.5    Assessment and Plan:  Acute CVA (left MCA territory infarct) in setting of left MCA stenosis s/p cerebral arteriogram with revascularization of left MCA stenosis using stent assisted angioplasty via right femoral approach 10/26/2020 by Dr. Estanislado Pandy. Patient's condition stable- awake and alert, aphasic but follows simple commands, moving left side spontaneously, RLE 4/5 and RUE 1/5. Dr. Estanislado Pandy at bedside to answer family questions- all questions answered and concerns addressed. For possible transfer to progressive today, possible transfer CIR once medically stable. Continue taking Brilinta 90 mg twice daily and Aspirin 81 mg once daily. Plan to follow-up with Dr. Estanislado Pandy in clinic 2 weeks after discharge ([facility] schedulers to call patient to set up this appointment). Further plans per neurology- appreciate and agree with management. NIR will continue to follow peripherally- please call NIR with questions/concerns.   Electronically Signed: Earley Abide, PA-C 10/31/2020, 10:43 AM   I spent a total of 15 Minutes at the the patient's bedside AND on the patient's hospital floor or unit, greater than 50% of which was counseling/coordinating care for  left MCA stenosis s/p revascularization.

## 2020-10-31 NOTE — Plan of Care (Signed)
Pt remains hypertensive, mute, and cooperative even with the language barrier/expressive aphasia.  Will continue to monitor and treat appropriately per MD orders.   Problem: Education: Goal: Knowledge of General Education information will improve Description: Including pain rating scale, medication(s)/side effects and non-pharmacologic comfort measures 10/31/2020 0320 by Luna Glasgow, RN Outcome: Not Progressing 10/30/2020 2347 by Luna Glasgow, RN Outcome: Not Progressing   Problem: Health Behavior/Discharge Planning: Goal: Ability to manage health-related needs will improve Outcome: Not Progressing   Problem: Clinical Measurements: Goal: Ability to maintain clinical measurements within normal limits will improve 10/31/2020 0320 by Luna Glasgow, RN Outcome: Progressing 10/30/2020 2347 by Luna Glasgow, RN Outcome: Not Progressing Goal: Will remain free from infection 10/31/2020 0320 by Luna Glasgow, RN Outcome: Progressing 10/30/2020 2347 by Luna Glasgow, RN Outcome: Not Progressing Goal: Diagnostic test results will improve 10/31/2020 0320 by Luna Glasgow, RN Outcome: Progressing 10/30/2020 2347 by Luna Glasgow, RN Outcome: Not Progressing Goal: Respiratory complications will improve 10/31/2020 0320 by Luna Glasgow, RN Outcome: Not Applicable 5/32/9924 2683 by Luna Glasgow, RN Outcome: Not Progressing Goal: Cardiovascular complication will be avoided 10/31/2020 0320 by Luna Glasgow, RN Outcome: Progressing 10/30/2020 2347 by Luna Glasgow, RN Outcome: Not Progressing   Problem: Activity: Goal: Risk for activity intolerance will decrease Outcome: Not Progressing   Problem: Nutrition: Goal: Adequate nutrition will be maintained 10/31/2020 0320 by Luna Glasgow, RN Outcome: Progressing 10/30/2020 2347 by Luna Glasgow, RN Outcome: Not Progressing   Problem: Coping: Goal: Level of anxiety will decrease 10/31/2020 0320 by  Luna Glasgow, RN Outcome: Completed/Met 10/30/2020 2347 by Luna Glasgow, RN Outcome: Not Progressing   Problem: Elimination: Goal: Will not experience complications related to bowel motility 10/31/2020 0320 by Luna Glasgow, RN Outcome: Progressing 10/30/2020 2347 by Luna Glasgow, RN Outcome: Not Progressing Goal: Will not experience complications related to urinary retention 10/31/2020 0320 by Luna Glasgow, RN Outcome: Completed/Met 10/30/2020 2347 by Luna Glasgow, RN Outcome: Not Progressing   Problem: Pain Managment: Goal: General experience of comfort will improve 10/31/2020 0320 by Luna Glasgow, RN Outcome: Completed/Met 10/30/2020 2347 by Luna Glasgow, RN Outcome: Not Progressing   Problem: Safety: Goal: Ability to remain free from injury will improve 10/31/2020 0320 by Luna Glasgow, RN Outcome: Progressing 10/30/2020 2347 by Luna Glasgow, RN Outcome: Not Progressing   Problem: Skin Integrity: Goal: Risk for impaired skin integrity will decrease 10/31/2020 0320 by Luna Glasgow, RN Outcome: Progressing 10/30/2020 2347 by Luna Glasgow, RN Outcome: Not Progressing   Problem: Education: Goal: Knowledge of disease or condition will improve 10/31/2020 0320 by Luna Glasgow, RN Outcome: Not Progressing 10/30/2020 2347 by Luna Glasgow, RN Outcome: Not Progressing Goal: Knowledge of secondary prevention will improve 10/31/2020 0320 by Luna Glasgow, RN Outcome: Not Progressing 10/30/2020 2347 by Luna Glasgow, RN Outcome: Not Progressing

## 2020-10-31 NOTE — Progress Notes (Signed)
  Speech Language Pathology Treatment: Dysphagia;Cognitive-Linquistic  Patient Details Name: Brittany Griffith MRN: [id number] DOB: 21-Sep-1950 Today's Date: 10/31/2020 Time: 2130-8657 SLP Time Calculation (min) (ACUTE ONLY): 30 min  Assessment / Plan / Recommendation Clinical Impression  Expressive/receptive language, motor planning and swallow addressed with husband at bedside. Asensate to right buccal and labial pocketing and spill on right; appeared to use tongue minimally to remove. Prolonged mastication with trial regular texture and recommend continue with puree at this time. Needed mild-mod tactile cues to assist with volume control of honey thick liquids.  Variety of modalities presented to facilitate comprehension and motor execution during activities (visual w/ gestures and labial/lingual movement, written words). She did move lips 30% of the time during familiar songs but were not approximated to target. Cued husband to call pt using therapist's phone and pt using spouse's phone and spoke, out of sight, in native language. Slight change in facial expression but no attempts to respond verbally or articulatory movement. 0/3 accurate to identify numbers in field of 5. Yes/no questions 20% acc however nods yes to all questions. Biographical information written and did not increase accuracy this session. SLP used tactile stimulation to move her lips/face to desired position to form phoneme. Continued ST and she would benefit from inpatient CIR.   HPI HPI: Patient is a 70 y.o. female with PMH: HLD, HTN, osteopenia who presented to hospital with complaints of speech difficulty and right sided weakness but on EMS arrival symptoms had completely resolved. In ER later in the day, MD reported patient started to have difficulty with getting her words out. 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 The next day (3/14) patient had abnormal neuro checks in AM and repeat MRI  showed significant progression of acute infarct in the left MCA territory but negative for hemorrhagic transformation. On 3/16 pt underwent Patient underwent an image-guided cerebral arteriogram with revascularization of right MCA M1. Repeat head CT 3/17 revealed Hyperdense vessel left middle cerebral artery consistent with  occlusion of this vessel and extensive infarct throughout much of the left temporal lobe,  posterior left frontal lobe, anterior left parietal lobe consistent  with evolving left middle cerebral artery distribution infarct.  6 mm of midline  shift toward the right      SLP Plan  Continue with current plan of care       Recommendations  Diet recommendations: Dysphagia 1 (puree);Honey-thick liquid Liquids provided via: Cup Medication Administration: Crushed with puree Supervision: Patient able to self feed;Staff to assist with self feeding;Full supervision/cueing for compensatory strategies Compensations: Slow rate;Minimize environmental distractions;Small sips/bites Postural Changes and/or Swallow Maneuvers: Seated upright 90 degrees                Oral Care Recommendations: Oral care BID Follow up Recommendations: Inpatient Rehab SLP Visit Diagnosis: Dysphagia, oropharyngeal phase (R13.12);Aphasia (R47.01);Apraxia (R48.2);Cognitive communication deficit (Q46.962) Plan: Continue with current plan of care       Malcolm, Lisa Willis 10/31/2020, 11:20 AM Orbie Pyo Colvin Caroli.Ed Risk analyst [phone] Office [phone]

## 2020-11-01 ENCOUNTER — Inpatient Hospital Stay (HOSPITAL_COMMUNITY)
Admission: RE | Admit: 2020-11-01 | Discharge: 2020-11-22 | DRG: 057 | Disposition: A | Discharge status: Home / Self Care | Payer: Medicare HMO | Source: Intra-hospital | Attending: Physical Medicine & Rehabilitation | Admitting: Physical Medicine & Rehabilitation

## 2020-11-01 ENCOUNTER — Encounter (HOSPITAL_COMMUNITY): Payer: Self-pay | Admitting: Physical Medicine & Rehabilitation

## 2020-11-01 ENCOUNTER — Other Ambulatory Visit: Payer: Self-pay

## 2020-11-01 DIAGNOSIS — D329 Benign neoplasm of meninges, unspecified: Secondary | ICD-10-CM | POA: Diagnosis not present

## 2020-11-01 DIAGNOSIS — G936 Cerebral edema: Secondary | ICD-10-CM | POA: Diagnosis present

## 2020-11-01 DIAGNOSIS — Z79899 Other long term (current) drug therapy: Secondary | ICD-10-CM | POA: Diagnosis not present

## 2020-11-01 DIAGNOSIS — I10 Essential (primary) hypertension: Secondary | ICD-10-CM

## 2020-11-01 DIAGNOSIS — E876 Hypokalemia: Secondary | ICD-10-CM | POA: Diagnosis present

## 2020-11-01 DIAGNOSIS — R739 Hyperglycemia, unspecified: Secondary | ICD-10-CM | POA: Diagnosis present

## 2020-11-01 DIAGNOSIS — I160 Hypertensive urgency: Secondary | ICD-10-CM | POA: Diagnosis present

## 2020-11-01 DIAGNOSIS — I69320 Aphasia following cerebral infarction: Secondary | ICD-10-CM

## 2020-11-01 DIAGNOSIS — K5900 Constipation, unspecified: Secondary | ICD-10-CM | POA: Diagnosis present

## 2020-11-01 DIAGNOSIS — D320 Benign neoplasm of cerebral meninges: Secondary | ICD-10-CM | POA: Diagnosis present

## 2020-11-01 DIAGNOSIS — R1312 Dysphagia, oropharyngeal phase: Secondary | ICD-10-CM

## 2020-11-01 DIAGNOSIS — E785 Hyperlipidemia, unspecified: Secondary | ICD-10-CM | POA: Diagnosis present

## 2020-11-01 DIAGNOSIS — I63512 Cerebral infarction due to unspecified occlusion or stenosis of left middle cerebral artery: Secondary | ICD-10-CM | POA: Diagnosis present

## 2020-11-01 DIAGNOSIS — I69391 Dysphagia following cerebral infarction: Secondary | ICD-10-CM

## 2020-11-01 DIAGNOSIS — I6602 Occlusion and stenosis of left middle cerebral artery: Secondary | ICD-10-CM | POA: Diagnosis not present

## 2020-11-01 DIAGNOSIS — I639 Cerebral infarction, unspecified: Secondary | ICD-10-CM | POA: Diagnosis present

## 2020-11-01 DIAGNOSIS — R339 Retention of urine, unspecified: Secondary | ICD-10-CM | POA: Diagnosis present

## 2020-11-01 DIAGNOSIS — I63412 Cerebral infarction due to embolism of left middle cerebral artery: Secondary | ICD-10-CM | POA: Diagnosis not present

## 2020-11-01 DIAGNOSIS — R1310 Dysphagia, unspecified: Secondary | ICD-10-CM

## 2020-11-01 DIAGNOSIS — R4701 Aphasia: Secondary | ICD-10-CM | POA: Diagnosis not present

## 2020-11-01 DIAGNOSIS — E7800 Pure hypercholesterolemia, unspecified: Secondary | ICD-10-CM | POA: Diagnosis present

## 2020-11-01 DIAGNOSIS — G8929 Other chronic pain: Secondary | ICD-10-CM

## 2020-11-01 DIAGNOSIS — R1319 Other dysphagia: Secondary | ICD-10-CM | POA: Diagnosis present

## 2020-11-01 DIAGNOSIS — G8191 Hemiplegia, unspecified affecting right dominant side: Secondary | ICD-10-CM

## 2020-11-01 DIAGNOSIS — I69351 Hemiplegia and hemiparesis following cerebral infarction affecting right dominant side: Principal | ICD-10-CM

## 2020-11-01 DIAGNOSIS — G40209 Localization-related (focal) (partial) symptomatic epilepsy and epileptic syndromes with complex partial seizures, not intractable, without status epilepticus: Secondary | ICD-10-CM | POA: Diagnosis present

## 2020-11-01 LAB — GLUCOSE, CAPILLARY
Glucose-Capillary: 103 mg/dL — ABNORMAL HIGH (ref 70–99)
Glucose-Capillary: 117 mg/dL — ABNORMAL HIGH (ref 70–99)
Glucose-Capillary: 134 mg/dL — ABNORMAL HIGH (ref 70–99)
Glucose-Capillary: 135 mg/dL — ABNORMAL HIGH (ref 70–99)
Glucose-Capillary: 157 mg/dL — ABNORMAL HIGH (ref 70–99)
Glucose-Capillary: 158 mg/dL — ABNORMAL HIGH (ref 70–99)

## 2020-11-01 LAB — BASIC METABOLIC PANEL WITH GFR
Anion gap: 10 (ref 5–15)
BUN: 25 mg/dL — ABNORMAL HIGH (ref 8–23)
CO2: 25 mmol/L (ref 22–32)
Calcium: 9.5 mg/dL (ref 8.9–10.3)
Chloride: 115 mmol/L — ABNORMAL HIGH (ref 98–111)
Creatinine, Ser: 0.76 mg/dL (ref 0.44–1.00)
GFR, Estimated: >60 mL/min
Glucose, Bld: 126 mg/dL — ABNORMAL HIGH (ref 70–99)
Potassium: 3.2 mmol/L — ABNORMAL LOW (ref 3.5–5.1)
Sodium: 150 mmol/L — ABNORMAL HIGH (ref 135–145)

## 2020-11-01 LAB — CBC
HCT: 39.2 % (ref 36.0–46.0)
Hemoglobin: 12.9 g/dL (ref 12.0–15.0)
MCH: 29 pg (ref 26.0–34.0)
MCHC: 32.9 g/dL (ref 30.0–36.0)
MCV: 88.1 fL (ref 80.0–100.0)
Platelets: 220 10*3/uL (ref 150–400)
RBC: 4.45 MIL/uL (ref 3.87–5.11)
RDW: 13.5 % (ref 11.5–15.5)
WBC: 11.1 10*3/uL — ABNORMAL HIGH (ref 4.0–10.5)
nRBC: 0 % (ref 0.0–0.2)

## 2020-11-01 MED ORDER — POTASSIUM CHLORIDE 20 MEQ PO PACK
40.0000 meq | PACK | ORAL | Status: DC
  Administered 2020-11-01: 40 meq
  Filled 2020-11-01: qty 2

## 2020-11-01 MED ORDER — ENOXAPARIN SODIUM 40 MG/0.4ML ~~LOC~~ SOLN
40.0000 mg | SUBCUTANEOUS | Status: DC
  Administered 2020-11-01 – 2020-11-21 (×21): 40 mg via SUBCUTANEOUS
  Filled 2020-11-01 (×23): qty 0.4

## 2020-11-01 MED ORDER — SENNOSIDES-DOCUSATE SODIUM 8.6-50 MG PO TABS
1.0000 | ORAL_TABLET | Freq: Every day | ORAL | Status: DC
  Administered 2020-11-01 – 2020-11-21 (×19): 1
  Filled 2020-11-01 (×20): qty 1

## 2020-11-01 MED ORDER — ACETAMINOPHEN 325 MG PO TABS
650.0000 mg | ORAL_TABLET | ORAL | Status: DC | PRN
  Administered 2020-11-08 – 2020-11-21 (×8): 650 mg via ORAL
  Filled 2020-11-01 (×8): qty 2

## 2020-11-01 MED ORDER — AMLODIPINE BESYLATE 10 MG PO TABS
10.0000 mg | ORAL_TABLET | Freq: Every day | ORAL | Status: DC
  Administered 2020-11-02 – 2020-11-04 (×3): 10 mg
  Filled 2020-11-01 (×3): qty 1

## 2020-11-01 MED ORDER — FREE WATER
100.0000 mL | Status: DC
  Administered 2020-11-01 – 2020-11-03 (×12): 100 mL

## 2020-11-01 MED ORDER — RESOURCE THICKENUP CLEAR PO POWD
ORAL | Status: DC | PRN
  Filled 2020-11-01 (×2): qty 125

## 2020-11-01 MED ORDER — POLYETHYLENE GLYCOL 3350 17 G PO PACK: 17.0000 g | PACK | Freq: Every day | ORAL | Status: DC | PRN

## 2020-11-01 MED ORDER — ATORVASTATIN CALCIUM 80 MG PO TABS: 80.0000 mg | ORAL_TABLET | Freq: Every day | ORAL | 0 refills | Status: DC

## 2020-11-01 MED ORDER — POLYETHYLENE GLYCOL 3350 17 G PO PACK: 17.0000 g | PACK | Freq: Every day | ORAL | 0 refills | Status: DC | PRN

## 2020-11-01 MED ORDER — LOSARTAN POTASSIUM 50 MG PO TABS: 50.0000 mg | ORAL_TABLET | Freq: Two times a day (BID) | ORAL | 0 refills | Status: DC

## 2020-11-01 MED ORDER — LOSARTAN POTASSIUM 50 MG PO TABS
50.0000 mg | ORAL_TABLET | Freq: Two times a day (BID) | ORAL | Status: DC
  Administered 2020-11-01 – 2020-11-20 (×38): 50 mg
  Filled 2020-11-01 (×38): qty 1

## 2020-11-01 MED ORDER — ACETAMINOPHEN 650 MG RE SUPP
650.0000 mg | RECTAL | Status: DC | PRN
Start: 2020-11-01 — End: 2020-11-22

## 2020-11-01 MED ORDER — OSMOLITE 1.2 CAL PO LIQD
600.0000 mL | ORAL | Status: DC
  Administered 2020-11-01: 600 mL
  Filled 2020-11-01: qty 711

## 2020-11-01 MED ORDER — PROSOURCE TF PO LIQD
45.0000 mL | Freq: Two times a day (BID) | ORAL | Status: DC
  Administered 2020-11-01 – 2020-11-03 (×4): 45 mL
  Filled 2020-11-01 (×4): qty 45

## 2020-11-01 MED ORDER — INSULIN ASPART 100 UNIT/ML ~~LOC~~ SOLN
0.0000 [IU] | SUBCUTANEOUS | Status: DC
  Administered 2020-11-01 – 2020-11-02 (×5): 1 [IU] via SUBCUTANEOUS
  Administered 2020-11-02: 2 [IU] via SUBCUTANEOUS
  Administered 2020-11-02 – 2020-11-03 (×2): 1 [IU] via SUBCUTANEOUS
  Administered 2020-11-03: 2 [IU] via SUBCUTANEOUS
  Administered 2020-11-03 – 2020-11-05 (×4): 1 [IU] via SUBCUTANEOUS

## 2020-11-01 MED ORDER — SENNOSIDES-DOCUSATE SODIUM 8.6-50 MG PO TABS: 1.0000 | ORAL_TABLET | Freq: Every day | ORAL | 0 refills | Status: DC

## 2020-11-01 MED ORDER — ATORVASTATIN CALCIUM 80 MG PO TABS
80.0000 mg | ORAL_TABLET | Freq: Every day | ORAL | Status: DC
  Administered 2020-11-02 – 2020-11-22 (×21): 80 mg
  Filled 2020-11-01 (×21): qty 1

## 2020-11-01 MED ORDER — ENOXAPARIN SODIUM 40 MG/0.4ML ~~LOC~~ SOLN: 40.0000 mg | SUBCUTANEOUS | Status: DC

## 2020-11-01 MED ORDER — TICAGRELOR 90 MG PO TABS: 90.0000 mg | ORAL_TABLET | Freq: Two times a day (BID) | ORAL | 0 refills | Status: DC

## 2020-11-01 MED ORDER — LEVETIRACETAM 100 MG/ML PO SOLN
500.0000 mg | Freq: Two times a day (BID) | ORAL | Status: DC
  Administered 2020-11-01 – 2020-11-22 (×42): 500 mg
  Filled 2020-11-01 (×42): qty 5

## 2020-11-01 MED ORDER — ASPIRIN 81 MG PO CHEW
81.0000 mg | CHEWABLE_TABLET | Freq: Every day | ORAL | Status: DC
  Administered 2020-11-02 – 2020-11-03 (×2): 81 mg
  Filled 2020-11-01 (×8): qty 1

## 2020-11-01 MED ORDER — TICAGRELOR 90 MG PO TABS
90.0000 mg | ORAL_TABLET | Freq: Two times a day (BID) | ORAL | Status: DC
  Administered 2020-11-02 – 2020-11-22 (×38): 90 mg via ORAL
  Filled 2020-11-01 (×39): qty 1

## 2020-11-01 MED ORDER — TICAGRELOR 90 MG PO TABS
90.0000 mg | ORAL_TABLET | Freq: Two times a day (BID) | ORAL | Status: DC
  Administered 2020-11-01 – 2020-11-08 (×4): 90 mg
  Filled 2020-11-01 (×21): qty 1

## 2020-11-01 MED ORDER — LEVETIRACETAM 100 MG/ML PO SOLN: 500.0000 mg | Freq: Two times a day (BID) | ORAL | 12 refills | Status: DC

## 2020-11-01 MED ORDER — AMLODIPINE BESYLATE 10 MG PO TABS: 10.0000 mg | ORAL_TABLET | Freq: Every day | ORAL | 0 refills | Status: DC

## 2020-11-01 MED ORDER — ASPIRIN 81 MG PO CHEW
81.0000 mg | CHEWABLE_TABLET | Freq: Every day | ORAL | Status: DC
  Administered 2020-11-04 – 2020-11-22 (×19): 81 mg via ORAL
  Filled 2020-11-01 (×20): qty 1

## 2020-11-01 MED ORDER — ASPIRIN 81 MG PO CHEW: 81.0000 mg | CHEWABLE_TABLET | Freq: Every day | ORAL | 0 refills | Status: AC

## 2020-11-01 MED ORDER — ACETAMINOPHEN 160 MG/5ML PO SOLN
650.0000 mg | ORAL | Status: DC | PRN
  Administered 2020-11-07 – 2020-11-13 (×4): 650 mg
  Filled 2020-11-01 (×4): qty 20.3

## 2020-11-01 NOTE — Discharge Summary (Addendum)
Stroke Discharge Summary  Patient ID: Brittany Griffith   MRN: [id number]      DOB: August 19, 1950  Date of Admission: 10/23/2020 Date of Discharge: 11/01/2020  Attending Physician:  Rosalin Hawking, MD, Stroke MD Consultant(s):   vascular surgery and Neurosurgery: meningiomas Patient's PCP:  [facility] [place]  Discharge Diagnoses: Left MCA stroke, L M1 stenosis s/t stenting Principal Problem:   left MCA stroke Active Problems:   Meningioma ([place])   Cerebral edema ([place])   Expressive aphasia   Hypertensive urgency   Acute CVA (cerebrovascular accident) ([place])   Middle cerebral artery stenosis, left   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   aspirin 81 MG chewable tablet Chew 1 tablet (81 mg total) by mouth daily.   atorvastatin 80 MG tablet Commonly known as: LIPITOR Place 1 tablet (80 mg total) into feeding tube daily.   levETIRAcetam 100 MG/ML solution Commonly known as: KEPPRA Place 5 mLs (500 mg total) into feeding tube 2 (two) times daily.   losartan 50 MG tablet Commonly known as: COZAAR Place 1 tablet (50 mg total) into feeding tube 2 (two) times daily.   multivitamin with minerals tablet Take 1 tablet by mouth daily.   polyethylene glycol 17 g packet Commonly known as: MIRALAX / GLYCOLAX Place 17 g into feeding tube daily as needed for moderate constipation.   senna-docusate 8.6-50 MG tablet Commonly known as: Senokot-S Place 1 tablet into feeding tube at bedtime.   ticagrelor 90 MG Tabs tablet Commonly known as: BRILINTA Take 1 tablet (90 mg total) by mouth 2 (two) times daily.       LABORATORY STUDIES CBC    Component Value Date/Time    WBC 11.1 (H) 11/01/2020 0404   RBC 4.45 11/01/2020 0404   HGB 12.9 11/01/2020 0404   HCT 39.2 11/01/2020 0404   PLT 220 11/01/2020 0404   MCV 88.1 11/01/2020 0404   MCH 29.0 11/01/2020 0404   MCHC 32.9 11/01/2020 0404   RDW 13.5 11/01/2020 0404   LYMPHSABS 2.7 10/27/2020 0624   MONOABS 0.6 10/27/2020 0624   EOSABS 0.0 10/27/2020 0624   BASOSABS 0.0 10/27/2020 0624   CMP    Component Value Date/Time   NA 150 (H) 11/01/2020 0404   K 3.2 (L) 11/01/2020 0404   CL 115 (H) 11/01/2020 0404   CO2 25 11/01/2020 0404   GLUCOSE 126 (H) 11/01/2020 0404   BUN 25 (H) 11/01/2020 0404   CREATININE 0.76 11/01/2020 0404   CALCIUM 9.5 11/01/2020 0404   PROT 6.6 10/24/2020 0448   ALBUMIN 3.5 10/24/2020 0448   AST 18 10/24/2020 0448   ALT 15 10/24/2020 0448   ALKPHOS 53 10/24/2020 0448   BILITOT 0.6 10/24/2020 0448   GFRNONAA >60 11/01/2020 0404   COAGS Lab Results  Component Value Date   INR 1.1 10/25/2020   Lipid Panel    Component Value Date/Time   CHOL 248 (H) 10/24/2020 0448   TRIG 85 10/28/2020 0206   HDL 77 10/24/2020 0448   CHOLHDL 3.2 10/24/2020 0448   VLDL 12 10/24/2020 0448   LDLCALC 159 (H) 10/24/2020 0448  HgbA1C  Lab Results  Component Value Date   HGBA1C 5.6 10/24/2020   Urinalysis    Component Value Date/Time   COLORURINE YELLOW 10/31/2020 0908   APPEARANCEUR CLEAR 10/31/2020 0908   LABSPEC 1.018 10/31/2020 0908   PHURINE 6.0 10/31/2020 0908   GLUCOSEU NEGATIVE 10/31/2020 0908   HGBUR NEGATIVE 10/31/2020 0908   BILIRUBINUR NEGATIVE 10/31/2020 0908   KETONESUR NEGATIVE 10/31/2020 0908   PROTEINUR NEGATIVE 10/31/2020 0908   NITRITE NEGATIVE 10/31/2020 0908   LEUKOCYTESUR NEGATIVE 10/31/2020 0908   Urine Drug Screen     Component Value Date/Time   LABOPIA NONE DETECTED 10/23/2020 1430   COCAINSCRNUR NONE DETECTED 10/23/2020 1430   LABBENZ NONE DETECTED 10/23/2020 1430   AMPHETMU NONE DETECTED 10/23/2020 1430   THCU NONE DETECTED 10/23/2020 1430    LABBARB NONE DETECTED 10/23/2020 1430    Alcohol Level No results found for: ETH   SIGNIFICANT DIAGNOSTIC STUDIES  MR ANGIO HEAD WO CONTRAST  Result Date: 10/30/2020 IMPRESSION: 1. Considerable extension of the left middle cerebral artery territory infarction, now involving the vast majority of the cortical and subcortical distribution of the left middle cerebral artery, with sparing of the basal ganglia. Petechial blood products without frank hematoma. 2. Motion degraded MR angiography. Signal loss related 2 left MCA stent. Flow does appear to be present in the more distal left MCA branch vessels presently. Electronically Signed   By: Nelson Chimes M.D.   On: 10/30/2020 12:58   MR BRAIN WO CONTRAST  Result Date: 10/30/2020  IMPRESSION:  1. Considerable extension of the left middle cerebral artery territory infarction, now involving the vast majority of the cortical and subcortical distribution of the left middle cerebral artery, with sparing of the basal ganglia. Petechial blood products without frank hematoma. 2. Motion degraded MR angiography. Signal loss related 2 left MCA stent. Flow does appear to be present in the more distal left MCA branch vessels presently.   CT HEAD WO CONTRAST  Addendum Date: 10/27/2020   ADDENDUM REPORT: 10/27/2020 15:45 ADDENDUM: Comment: The increased attenuation in the left middle cerebral artery is actually due to a stent in this vessel.   Result Date: 10/27/2020  IMPRESSION: 1. Hyperdense vessel left middle cerebral artery consistent with occlusion of this vessel. 2. Extensive infarct throughout much of the left temporal lobe, posterior left frontal lobe, anterior left parietal lobe consistent with evolving left middle cerebral artery distribution infarct. This infarct includes portions of the left external capsule, claustrum, extreme capsule, and insular cortex. There is cytotoxic edema causing effacement of the left frontal horn and 6 mm of midline shift  toward the right. Fourth ventricle midline. 3. Left frontal meningioma with extensive dural base calcification on the left persists without change. 4.  Foci of arterial vascular calcification noted. 5.  Areas of paranasal sinus disease noted. These results will be called to the ordering clinician or representative by the Radiologist Assistant, and communication documented in the PACS or [employer]. Electronically Signed: By: Lowella Grip III M.D. On: 10/27/2020 11:18   CT HEAD WO CONTRAST  Result Date: 10/26/2020 IMPRESSION: Evolving subacute infarcts in the left MCA territory, consistent with recent MRI. No new infarct or hemorrhage. Extensive dural ossification compatible with meningioma on the left.   CTA head Result date 10/25/2020 Evolving recent left MCA territory infarctions. No acute intracranial hemorrhage or significant mass effect.  Similar appearance of intracranial atherosclerosis compared to recent prior study.  CT ANGIO HEAD CODE STROKE  Result Date: 10/25/2020  IMPRESSION: Evolving  recent left MCA territory infarctions. No acute intracranial hemorrhage or significant mass effect. Similar appearance of intracranial atherosclerosis compared to recent prior study.   MRI BRAIN WO 10/24/2020 Acute infarct in the left MCA territory with significant progression since yesterday. This involves the insula and deep white matter with small areas of cortical infarct. 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 No associated brain edema.  CT HEAD WO CONTRAST  Result Date: 10/24/2020 1. Appearance suspicious for new Left PCA territory ischemia in the occipital pole. However, this might be streak artifact related to the complex left skull lesion.  2. Cytotoxic edema now evident at the posterior left  insula and mildly larger than the DWI lesion yesterday.  3. Stable brain otherwise. No acute hemorrhage or increased intracranial mass effect. 4. Stable complex left frontal convexity meningioma(s) and skull hyperostosis.   MR BRAIN WO CONTRAST  Result Date: 10/24/2020 IMPRESSION: Acute infarct in the left MCA territory with significant progression since yesterday. This involves the insula and deep white matter with small areas of cortical infarct. 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 No associated brain edema.   MR Brain W and Wo Contrast  Result Date: 10/23/2020 IMPRESSION:  1. 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  2. 2.6 cm enhancing left frontal parafalcine mass consistent with a meningioma with mild edema and minimal rightward midline shift. This is associated with extensive dural ossification and left frontal skull hyperostosis along the anterior and lateral cerebral convexities. 3. Smaller meningiomas along the falx in the posterior left frontal region and laterally in the left sylvian fissure.  4. 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  CTA HEAD/NECK IMPRESSION: 1. 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 Mild edema in the left frontal lobe. Trace local rightward midline shift. Brain MRI without and with contrast is recommended for further evaluation. 2. No evidence of an acute infarct or intracranial hemorrhage. 3. No large vessel occlusion. 4. Intracranial atherosclerosis including moderate right and severe left M1 stenoses and moderate bilateral P2 stenoses. 5. Widely patent cervical carotid and vertebral arteries. 6. 2.3 cm right thyroid nodule.  Recommend thyroid US (ref: J Am Coll  EEG adult Result Date: 10/23/2020 IMPRESSION: This study is suggestive of cortical dysfunction in left temporal region which is non specific but could be secondary to underlying structural abnormality. No seizures or epileptiform discharges were seen throughout the recording. [place]         HISTORY OF PRESENT ILLNESS Basil Blakesley is an 70 y.o. female with a PMHx of HTN who presented to the ED this morning via EMS after she was found by her husband sitting on the floor slumped over, nonverbal and unable to follow commands, but awake, tracking with her eyes and crying. Husband had been awakened from a nap hearing abnormal banging sounds. Husband then lifted his wife off the floor and noticed right-sided weakness and a right-sided facial droop. LKN was 0930. She continued to exhibit the same symptoms on EMS arrival to the home, with resolution approximately 20 minutes later. On arrival to the ED, she was alert and oriented x 4, able to follow  commands and with no neuro deficits seen on Triage RN assessment. She was complaining of a mild headache the previous night and being unable to sleep well for a few days. Her initial BP on arrival was 190/98, HR 60, normal O2 saturation, CBG 113. BP subsequently was 200/77. In the ED the patient stated that she had been folding a blanket at about 9:30 AM when she noticed that she was unable to perform the task. She then sat on the ground and has no memory of the events occurring afterwards. She has no prior history of stroke or TIA. She is only on OTC vitamins and fish oil. She had no visual changes, facial droop, speech changes, lateralized weakness, dizziness during the periods that she can recall prior to and after the episode. There were no prodromal symptoms prior to the above, and other than the headache, she has been feeling well. Total time of her AMS is estimated to be about one hour.   HOSPITAL COURSE Ms.  Brittany Griffith is a 70 y.o. female with history of hypertension who was found sitting on the floor slumped over, nonverbal and unable to follow commands, but awake, tracking with her eyes and crying.  Husband then liftedhiswife off the floor and noticed right-sided weakness and a right-sided facial droop.LKN was 0930. She continued to exhibit the same symptoms on EMS arrival to the home, with resolution approximately 20 minutes later.  MRI revealed small strokes scattered in the left MCA territory in the context of severe left M1 stenosis seen on CTA. Of note, she also has multiple left hemisphere meningiomas, the largest being associated with mild edema and minimal rightward shift.  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 Stent occlusion  CT head 3/13 no acute infarct   CTA head and neck 3/13 bilateral M1 high-grade stenosis and bilateral P2 moderate stenosis.   MRI 10/23/2020 showed 3 punctate infarcts at left insular cortex.  Neuro worsening with CT 3/14 showed extension of left MCA infarct.   MRI 3/14 confirmed left MCA infarct extension.   Repeat CTA head 3/15 similar bilateral M1 high-grade stenosis.   Status post left M1 stent on 10/26/2020.   However, patient symptoms continue to worsen with right hemiplegia, therefore3/17/2022 CT repeat which showed left large MCA infarct.   MRI 3/20 large left MCA infarct  MRA signal loss related to left MCA stents, ? stent occlusion   No further intervention at this time. Plan to follow-up with Dr. Estanislado Pandy in clinic 2 weeks after discharge   EEG negative for seizure.   EF 60 to 65%.  LDL 159  HgbA1c 5.6  VTE prophylaxis - lovenox  No antithrombotic prior to admission. On ASA 81mg  daily and Brilinta 90 mg BID post stenting for 3 months  Therapy recommendations: CIR  Dispo: CIR  today  Meningioma  CT head showed left frontal parafalcine mass with edema and midline shift, consistent with aggressive meningioma.  Neurosurgery consulted: No acute neurosurgical intervention indicated, Left frontal meningioma likely asymptomatic and longstanding can be followed conservatively and no need for neurosurgical treatment at the present time.  ? partial complex seizure  Brief episode of aphasia and right sided weakness at home  EEG: cortical dysfunction in left temporal region, No epileptiform discharges   On keppra 500mg  bid now  Cerebral Edema with minimal midline shift  Was on 3% saline, discontinued 3/20  Allow Na gradually trending down  Na 139->148->157->155->156->152->150  On TF and FW  Dysphagia  SLP mod barium: Dysphagia I diet and honey thick liquids with alternative means for medications recommended  Cortrak in place with TF  dietitian on nocturnal tube feeds and calorie count  Osmolite 1.2 at 60 ml/h(686ml per day) x 10 hours (8pm to 6am)  Encourage po intake  Free water 100 Q4h  Hypertension  Home meds:  None  Norvasc 10mg  daily, Iosartan 50mg  bid  Prn hydralazine  Keep systolic BP less than 263   Avoid hypotension   Long-term BP goal normotensive  Hyperlipidemia  Home meds:  none  LDL 159, goal < 70  Atorvastatin 80mg  daily  Continue statin at discharge  Hypokalemia  3.1->4.5->3.0->3.1->3.2  Mag 2.0 (3/20)  Replete with potassium chloride 40 meq packet x2  Leukocytosis   WBC 10.8->9.6->12.7->10.8->11.1  Repeat UA negative  Other Stroke Risk Factors  Advanced Age >/= 33   Other Active Problems  2.3 cm right thyroid nodule. Recommend PCP followup and thyroid US   DISCHARGE EXAM Blood pressure (!) 157/66, pulse 74, temperature 98.5 F (36.9 C), temperature source Oral, resp. rate 18, height 5\' 2"  (1.575 m), weight 57.8 kg, SpO2 98 %. PHYSICAL EXAM Physical Exam HEENT-Skull asymmetry due  to hyperostosis. No trauma seen. Lungs-Respirations unlabored Extremities-No edema  Neurological Examination Mental Status: Patient is awake. Will follow one step commands only. No verbal response. Does not answer any questions. Global aphasia. Cranial Nerves: II: Visual fieldscannot be formally tested   III,IV, FH:LKTG  V,VII:Unable to formally test sensation, but reacts to touch bilaterally.Right facial droop is prominent when smiling. VIII:Hearing intact to some commands. IX,X:Unable to assess. Not following commands for mouth opening. YB:WLSL is midline XII:Did not protrude tongue. Motor: RUE 1/5 grip, biceps, triceps and deltoid. Drift present LUE 4+/5 grip, biceps, triceps and deltoid. RLE 4/5 hip and knee flexion LLE 4+/5 hip and knee flexion Sensory:Reacts to touch x 4 but does not answer questions regarding the intensity of the touch or temperature stimuli Cerebellar: ataxia on FN bilaterally Gait:Deferred due to falls risk concerns  Discharge Diet      Diet   DIET - DYS 1 Room service appropriate? No; Fluid consistency: Honey Thick   liquids  DISCHARGE PLAN  Disposition:  Transfer to [place] for ongoing PT, OT and ST  aspirin 81 mg daily and Brilinta (ticagrelor) 90 mg bid for post left MCA stenting  Recommend ongoing stroke risk factor control by Primary Care Physician at time of discharge from inpatient rehabilitation.  Follow-up PCP [facility] [place] [place] in 2 weeks following discharge from rehab.  Follow-up in [facility] in 4 weeks following discharge from rehab, office to schedule an appointment.   Follow-up with Dr. Emelda Brothers from Neurosurgery in 4 weeks following discharge for further evaluation and management of meningiomas  Follow-up Dr. Estanislado Pandy in clinic 2 weeks after discharge, office to schedule an appointment.   45 minutes were spent preparing  discharge.  ATTENDING NOTE: I reviewed above note and agree with the assessment and plan. Pt was seen and examined.   Daughter and husband are at the bedside. Pt neuro stable, no change from yesterday still has global aphasia and RUE plegia but RLE 3/5. On nocturnal TF and encourage po intake. Ready for CIR today. She will follow up with Dr. Estanislado Pandy neuro IR and [facility] neurology as well as Dr. Zada Finders at [facility] clinic.   For  detailed assessment and plan, please refer to above as I have made changes wherever appropriate.   Rosalin Hawking, MD PhD Stroke Neurology 11/01/2020 3:04 PM

## 2020-11-01 NOTE — TOC Transition Note (Signed)
Transition of Care [facility]) - CM/SW Discharge Note   Patient Details  Name: Brittany Griffith MRN: [id number] Date of Birth: Oct 21, 1950  [facility] of Care [facility] CM/SW Contact:  Pollie Friar, RN Phone Number: 11/01/2020, 12:47 PM   Clinical Narrative:    Patient is discharging to CIR today. CM signing off.   Final next level of care: IP Rehab Facility Barriers to Discharge: No Barriers Identified   Patient Goals and CMS Choice        Discharge Placement                       Discharge Plan and Services                                     Social Determinants of Health (SDOH) Interventions     Readmission Risk Interventions No flowsheet data found.

## 2020-11-01 NOTE — H&P (Signed)
Physical Medicine and Rehabilitation Admission H&P Chief Complaint:  R hemiplegia due to L MCA infarct    No chief complaint on file. : HPI: Brittany Griffith is a 70 year old right-handed limited English speaking female with history of hyperlipidemia as well as hypertension.  Per chart review lives with spouse and independent prior to admission.  1 level home one-step to entry.  Presented 10/23/2020 with right side weakness, headache and slurred speech.  Noted blood pressure 200/77.  CT angiogram of head and neck showed left frontal parafalcine mass with extensive regional hyperostosis possibly reflecting en plaque meningioma.  Mild edema in left frontal lobe.  Trace local rightward midline shift.  No large vessel occlusion.  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  2.6 cm enhancing left frontal parafalcine mass consistent with meningioma minimal rightward midline shift.  Neurosurgery Dr. Venetia Constable follow-up in regards to meningioma felt to be slow-growing and would follow-up outpatient after initial evaluation by neurology services for CVA.  EEG negative for seizure and maintained on Keppra for seizure prophylaxis..  Echocardiogram with ejection fraction of 60 to 65% no wall motion abnormalities.  Patient underwent cerebral angiogram 10/26/2020 followed by stent assisted angioplasty of left MCA with  TTICI revascularization per interventional radiology.  Currently maintained on aspirin 81 mg daily and Brilinta 90 mg twice daily post stenting.  Patient was cleared to begin Lovenox for DVT prophylaxis.  She was maintained on 3% saline for her cerebral edema and monitoring of sodium levels.  On 10/30/2020 patient with questionable increasing weakness right upper extremity MRI/MRA completed showing considerable extension of the left middle cerebral artery territory infarction involving the vast majority of the cortical and subcortical distribution of the left  middle cerebral artery with sparing of the basal ganglia and patient remained on aspirin as well as Brilinta..  Currently on a dysphagia #1 honey thick liquid diet with nasogastric tube feeds for nutritional support.  Therapy evaluations completed due to patient decreased functional ability, right side weakness and slurred speech was admitted for a comprehensive rehab program.   Per daughter and husband who were at bedside- pt has been nodding yes and no- and using some hand signals, however no actual speech.  She's eating ~ 40-50% of meals- with new D1 honey thick liquids.  LBM this AM- using purewick and is voiding.  Has Cortak- getting TFs at night.     Review of Systems  Unable to perform ROS: Patient nonverbal  Constitutional: Negative for chills and fever.  HENT: Negative for hearing loss.   Eyes: Negative for blurred vision and double vision.  Respiratory: Negative for cough and shortness of breath.   Cardiovascular: Negative for chest pain, palpitations and leg swelling.  Gastrointestinal: Negative for constipation, heartburn, nausea and vomiting.  Genitourinary: Negative for dysuria, flank pain and hematuria.  Musculoskeletal: Positive for joint pain and myalgias.  Skin: Negative for rash.  Neurological: Positive for speech change and weakness.  All other systems reviewed and are negative.  Past Medical History:  Diagnosis Date  . Hypercholesteremia   . Hypertension   . Osteopenia    Past Surgical History:  Procedure Laterality Date  . RADIOLOGY WITH ANESTHESIA Left 10/26/2020   Procedure: RADIOLOGY WITH ANESTHESIA LEFT MCA STENT;  Surgeon: Luanne Bras, MD;  Location: [facility];  Service: Radiology;  Laterality: Left;   Family History  Problem Relation Age of Onset  . Stroke Father   . Heart attack Father   .  Kidney disease Brother    Social History:  reports that she has never smoked. She has never used smokeless tobacco. No history on file for alcohol use and  drug use. Allergies:  Allergies  Allergen Reactions  . Penicillins     unk reaction   Medications Prior to Admission  Medication Sig Dispense Refill  . amLODipine (NORVASC) 10 MG tablet Place 1 tablet (10 mg total) into feeding tube daily. 30 tablet 0  . aspirin 81 MG chewable tablet Chew 1 tablet (81 mg total) by mouth daily. 30 tablet 0  . atorvastatin (LIPITOR) 80 MG tablet Place 1 tablet (80 mg total) into feeding tube daily. 30 tablet 0  . levETIRAcetam (KEPPRA) 100 MG/ML solution Place 5 mLs (500 mg total) into feeding tube 2 (two) times daily. 473 mL 12  . losartan (COZAAR) 50 MG tablet Place 1 tablet (50 mg total) into feeding tube 2 (two) times daily. 30 tablet 0  . Multiple Vitamins-Minerals (MULTIVITAMIN WITH MINERALS) tablet Take 1 tablet by mouth daily.    . polyethylene glycol (MIRALAX / GLYCOLAX) 17 g packet Place 17 g into feeding tube daily as needed for moderate constipation. 14 each 0  . senna-docusate (SENOKOT-S) 8.6-50 MG tablet Place 1 tablet into feeding tube at bedtime. 30 tablet 0  . ticagrelor (BRILINTA) 90 MG TABS tablet Take 1 tablet (90 mg total) by mouth 2 (two) times daily. 60 tablet 0    Drug Regimen Review  Drug regimen was reviewed and remains appropriate with no significant issues identified  Home:     Functional History:    Functional Status:  Mobility:          ADL:    Cognition: Cognition Orientation Level: Disoriented X4    Physical Exam: There were no vitals taken for this visit. Physical Exam Vitals and nursing note reviewed. Exam conducted with a chaperone present.  Constitutional:      Comments: Pt sitting up slightly in bed- flat affect, daughter and husband at bedside, has cortrak, NAD  HENT:     Head:     Comments: Flat facies- cannot understand to smile/stick tongue out- cortrak in place-     Right Ear: External ear normal.     Left Ear: External ear normal.     Nose: Nose normal. No congestion.     Mouth/Throat:      Mouth: Mucous membranes are moist.     Pharynx: Oropharynx is clear. No oropharyngeal exudate.  Eyes:     General: No scleral icterus.       Right eye: No discharge.        Left eye: No discharge.     Comments: Conjugate gaze grossly, but cannot comply with cues for EOMs  Cardiovascular:     Comments: RRR- no JVD Pulmonary:     Comments: CTA B/L- no W/R/R- good air movement Abdominal:     Comments: Soft, NT, ND, (+)BS    Genitourinary:    Comments: purewick in place Musculoskeletal:     Cervical back: Normal range of motion and neck supple.     Comments: LUE in biceps, grip and finger abd 5-/5- also likely triceps, however pt kept perseverating during exam on previous command RUE- 0/5 except trace in deltoid LLE- HF, DF and PF 5-/5-  RLE- HF 2/5, KE 2+/5, DF 0/5 and Pt couldn't do PF due to apraxia?  Skin:    General: Skin is warm and dry.     Comments: No skin breakdown  seen- could not turn to assess backside- no breakdown on heels B/L  Neurological:     Comments: Patient is alert no acute distress.  Makes eye contact with examiner.  She is aphasic with language barrier.  Follows simple demonstrated commands.  Pt is globally aphasic, however appears to understand some things, just not everything- nonverbal- did shake head yes/no 1x- and showed was very apraxic/perseverative during exam Attempted to assess sensation- not able to tell with aphasia.   Psychiatric:     Comments: Flat- nonverbal- hard to assess     Results for orders placed or performed during the hospital encounter of 11/01/20 (from the past 48 hour(s))  Glucose, capillary     Status: Abnormal   Collection Time: 11/01/20  4:37 PM  Result Value Ref Range   Glucose-Capillary 117 (H) 70 - 99 mg/dL    Comment: Glucose reference range applies only to samples taken after fasting for at least 8 hours.   No results found.     Medical Problem List and Plan: 1.  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  Antithrombotics: -DVT/anticoagulation: Lovenox  -antiplatelet therapy: Aspirin 81 mg daily and Brilinta 90 mg twice daily 3. Pain Management: Tylenol as needed 4. Mood: Provide emotional support  -antipsychotic agents: N/A 5. Neuropsych: This patient is not capable of making decisions on her own behalf. 6. Skin/Wound Care: Routine skin checks 7. Fluids/Electrolytes/Nutrition: Routine in and outs with follow-up chemistries 8.  Dysphagia.  Dysphagia #1 honey thick liquids.  Nasogastric tube for nutritional support.  Dietary follow-up.  Speech therapy follow-up- did eat 40-50% of lunch today, FYI 9.  Hypertension.  Norvasc 10 mg daily, Cozaar 50 mg twice daily.  Monitor with increased mobility 10.  Seizure prophylaxis.  Keppra 500 mg twice daily.  EEG negative 11.  Hyperlipidemia.  Lipitor 12.  Incidental findings meningioma.  Follow-up outpatient neurosurgery Dr. Venetia Constable 13.  Hyperglycemia related to tube feeds.  SSI 14. Poor initiation/Aphasia- suggested Amantadine- family wanted to talk before deciding yes or no 15. I think pt's husband should be able to spend the night, esp for language barrier and reducing nursing burden-however said would allow primary rehab physician to make call past first night.    Lavon Paganini Anguilli- PA-C   I have personally performed a face to face diagnostic evaluation of this patient and formulated the key components of the plan.  Additionally, I have personally reviewed laboratory data, imaging studies, as well as relevant notes and concur with the physician assistant's documentation above.   The patient's status has not changed from the original H&P.  Any changes in documentation from the acute care chart have been noted above.     Courtney Heys, MD 11/01/2020

## 2020-11-01 NOTE — Progress Notes (Signed)
Calorie Count Note  48 hour calorie count ordered.  Diet: Dysphagia 1 with Honey Thick liquids  Day 1 Results (10/31/20): Breakfast: 579 kcals, 31g protein Lunch: no documentation available Dinner: 440 kcals, 17g protein  Total intake: 1019 kcal (60% of minimum estimated needs)  48g protein (56% of minimum estimated needs)  Nutrition Dx: Inadequate oral intake related to dysphagia as evidenced by meal completion < 50% (altered texture diet). Ongoing.   Goal: Patient will meet greater than or equal to 90% of their needs Progressing.   Intervention:  -Continue calorie count -Continue to encourage PO intake -Continue nocturnal TF regimen via Cortrak:  Osmolite 1.2 at 60 ml/h (600 ml per day) x 10 hours (8pm to 6am) Prosource TF 45 ml daily  Provides 800 kcal, 55 gm protein, 486 ml free water daily  100 ml free water every 4 hours Total free water: 1086 ml free water    Larkin Ina, MS, RD, LDN RD pager number and weekend/on-call pager number located in [place].

## 2020-11-01 NOTE — Progress Notes (Signed)
INPATIENT REHABILITATION ADMISSION NOTE   Arrival Method: Bed     Mental Orientation: Alert. Unable to respond verbally.    Assessment: Done   Skin: Scattered bruses on Abd and all extremities that are fading.   IV'S: Left upper arm   Pain: unable to verbalize   Tubes and Drains: Cortrak in place.    Safety Measures: fall risk. Family at bedside. Reviewed rehab fall policy with family.    Vital Signs: done   Height and Weight: 5'1" 65.1Kg   Rehab Orientation: done with family.    Family: At bedside.     Notes: done

## 2020-11-01 NOTE — Care Management Important Message (Signed)
Important Message  Patient Details  Name: Brittany Griffith MRN: [id number] Date of Birth: May 26, 1951   Medicare Important Message Given:  Yes  Patient left prior to IM delivery.  IM mailed to the patient address.  Milica Gully 11/01/2020, 3:52 PM

## 2020-11-01 NOTE — Progress Notes (Signed)
Inpatient Rehabilitation Medication Review by a Pharmacist  A complete drug regimen review was completed for this patient to identify any potential clinically significant medication issues.  Clinically significant medication issues were identified:  Yes   Type of Medication Issue Identified Description of Issue Urgent (address now) Non-Urgent (address on AM team rounds) Plan Plan Accepted by Provider? (Yes / No / Pending AM Rounds)  Drug Interaction(s) (clinically significant)       Duplicate Therapy       Allergy       No Medication Administration End Date       Incorrect Dose       Additional Drug Therapy Needed       Other  [place] discharge med list included MVI with minerals; pt did not receive this med while at [facility], and it was not ordered on admission to [facility] Pending review by CIR team on AM rounds Pending AM rounds    Name of provider notified for urgent issues identified:  N/A  For non-urgent medication issues to be resolved on team rounds tomorrow morning a [facility] Secure Chat Handoff was sent to:  Marlowe [place], PA  Time spent performing this drug regimen review (minutes):  15  Gillermina Hu, PharmD, BCPS, [facility] Clinical Pharmacist 11/01/2020 3:20 PM

## 2020-11-01 NOTE — Progress Notes (Signed)
STROKE TEAM PROGRESS NOTE   INTERVAL HISTORY No acute events overnight.  daughter at bedside.   Patient no acute event overnight. Lying in bed, vitals stable.  BP under control.  Still has global aphasia, right arm plegic, however right lower extremity is recovering well.  Sodium trending down gradually.  Continue nocturnal tube feeding, encourage p.o. intake.  PT/OT recommend inpatient rehab.  Patient will follow up with Dr. Estanislado Pandy as outpatient.  Continue aspirin and Brilinta and statin.  Potassium still low, status post supplement.  Leukocytosis (11.1), Tmax 99.1, UA negative.    Vitals:   10/31/20 1938 10/31/20 2308 11/01/20 0320 11/01/20 0755  BP: (!) 187/87 (!) 139/54 (!) 147/54 (!) 161/70  Pulse: 94 89 78 75  Resp: 17 19 17 18   Temp: 99.5 F (37.5 C) 99.1 F (37.3 C) 99.1 F (37.3 C) 98.6 F (37 C)  TempSrc: Oral Oral Oral Oral  SpO2: 96% 95% 95% 96%  Weight:      Height:       CBC:  Recent Labs  Lab 10/27/20 0624 10/29/20 0243 10/31/20 0604 11/01/20 0404  WBC 10.8*   < > 10.8* 11.1*  NEUTROABS 7.5  --   --   --   HGB 10.9*   < > 13.1 12.9  HCT 31.6*   < > 39.6 39.2  MCV 83.6   < > 87.8 88.1  PLT 202   < > 220 220   < > = values in this interval not displayed.   Basic Metabolic Panel:  Recent Labs  Lab 10/29/20 1748 10/29/20 2246 10/30/20 0154 10/30/20 1056 10/31/20 0604 11/01/20 0404  NA 154*   < > 155*   < > 152* 150*  K  --   --  3.0*  --  3.1* 3.2*  CL  --   --  120*  --  119* 115*  CO2  --   --  20*  --  23 25  GLUCOSE  --   --  125*  --  145* 126*  BUN  --   --  17  --  21 25*  CREATININE  --   --  0.64  --  0.68 0.76  CALCIUM  --   --  9.4  --  9.8 9.5  MG 2.0  --  2.0  --   --   --   PHOS 2.8  --  3.3  --   --   --    < > = values in this interval not displayed.   Lipid Panel:  Recent Labs  Lab 10/28/20 0206  TRIG 85   HgbA1c:  No results for input(s): HGBA1C in the last 168 hours. Urine Drug Screen:  No results for input(s):  LABOPIA, COCAINSCRNUR, LABBENZ, AMPHETMU, THCU, LABBARB in the last 168 hours.  IMAGING past 24 hours  MRI BRAIN WO 10/24/2020 Acute infarct in the left MCA territory with significant progression since yesterday. This involves the insula and deep white matter with small areas of cortical infarct. 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 No associated brain edema.  CT HEAD WO CONTRAST  Result Date: 10/24/2020 1. Appearance suspicious for new Left PCA territory ischemia in the occipital pole. However, this might be streak artifact related to the complex left skull lesion.  2. Cytotoxic edema now evident at the posterior left insula and  mildly larger than the DWI lesion yesterday.  3. Stable brain otherwise. No acute hemorrhage or increased intracranial mass effect. 4. Stable complex left frontal convexity meningioma(s) and skull hyperostosis.   MR BRAIN WO CONTRAST  Result Date: 10/24/2020 IMPRESSION: Acute infarct in the left MCA territory with significant progression since yesterday. This involves the insula and deep white matter with small areas of cortical infarct. 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 No associated brain edema.   MR Brain W and Wo Contrast  Result Date: 10/23/2020 IMPRESSION:  1. 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  2. 2.6 cm enhancing left frontal parafalcine mass consistent with a meningioma with mild edema and minimal rightward midline shift. This is associated with extensive dural ossification and left frontal skull hyperostosis along the anterior and lateral cerebral convexities.  3. Smaller meningiomas along the falx in the posterior left frontal region and laterally in the left sylvian fissure.  4. 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  CTA HEAD/NECK IMPRESSION: 1. 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 Mild edema in the left frontal lobe. Trace local rightward midline shift. Brain MRI without and with contrast is recommended for further evaluation. 2. No evidence of an acute infarct or intracranial hemorrhage. 3. No large vessel occlusion. 4. Intracranial atherosclerosis including moderate right and severe left M1 stenoses and moderate bilateral P2 stenoses. 5. Widely patent cervical carotid and vertebral arteries. 6. 2.3 cm right thyroid nodule. Recommend thyroid US (ref: J Am Coll  EEG adult Result Date: 10/23/2020 IMPRESSION: This study is suggestive of cortical dysfunction in left temporal region which is non specific but could be secondary to underlying structural abnormality. No seizures or epileptiform discharges were seen throughout the recording. Brittany Griffith   CTA head Result date 10/25/2020 Evolving recent left MCA territory infarctions. No acute intracranial hemorrhage or significant mass effect.  Similar appearance of intracranial atherosclerosis compared to recent prior study.  PHYSICAL EXAM Physical Exam  HEENT-  Skull asymmetry due to hyperostosis. No trauma seen.    Lungs- Respirations unlabored Extremities- No edema  Neurological Examination  Mental Status:   Patient is awake. Will follow one step commands only. No verbal response. Does not answer any questions. Global aphasia. Cranial Nerves: II: Visual fields cannot be formally tested   III,IV, VI: EOMI  V,VII: Unable to formally test sensation, but reacts to touch bilaterally. Right facial droop is prominent when smiling.  VIII: Hearing intact to some commands.   IX,X: Unable to assess. Not following commands for mouth opening.  XI: Head is midline XII: Did not protrude tongue.  Motor: RUE 1/5 grip, biceps, triceps and deltoid. Drift present LUE 4+/5 grip, biceps, triceps and deltoid. RLE 4/5 hip and knee flexion LLE 4+/5 hip and knee flexion Sensory: Reacts to touch x 4 but does not answer questions regarding the intensity of the touch or temperature stimuli Cerebellar: ataxia on FN bilaterally Gait: Deferred due to falls risk concerns   ASSESSMENT/PLAN Ms. Brittany Griffith is a 70 y.o. female with history of hypertension who was found sitting on the floor slumped over, nonverbal and unable to follow commands, but awake, tracking with her eyes and crying.  Husband then lifted his wife off the floor and noticed right-sided weakness and a right-sided  facial droop. LKN was 0930. She continued to exhibit the same symptoms on EMS arrival to the home, with resolution approximately 20 minutes later.  MRI revealed small strokes scattered in the left MCA territory in the context of severe left M1 stenosis seen on CTA. Of note, she also has multiple left hemisphere meningiomas, the largest being associated with mild edema and minimal rightward shift.  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 Stent occlusion  CT head 3/13 no acute infarct    CTA head and neck 3/13 bilateral M1 high-grade stenosis and bilateral P2 moderate stenosis.    MRI 10/23/2020 showed 3 punctate infarcts at left insular cortex.  Neuro worsening with CT 3/14 showed extension of left MCA infarct.    MRI 3/14 confirmed left MCA infarct extension.    Repeat CTA head 3/15 similar bilateral M1 high-grade stenosis.    Status post left M1 stent on 10/26/2020.    However, patient symptoms continue to worsen with right hemiplegia, therefore 10/27/2020 CT repeat which  showed left large MCA infarct.    MRI 3/20 large left MCA infarct  MRA signal loss related to left MCA stents, ? stent occlusion   No further intervention at this time. Plan to follow-up with Dr. Estanislado Pandy in clinic 2 weeks after discharge   EEG negative for seizure.    EF 60 to 65%.  LDL 159  HgbA1c 5.6  VTE prophylaxis - lovenox  No antithrombotic prior to admission. On ASA 81mg  daily and Brilinta 90 mg BID post stenting  Therapy recommendations: CIR  Disposition:  pending  Meningioma  CT head showed left frontal parafalcine mass with edema and midline shift, consistent with aggressive meningioma.  Neurosurgery consulted: No acute neurosurgical intervention indicated, Left frontal meningioma likely asymptomatic and longstanding can be followed conservatively and no need for neurosurgical treatment at the present time.  ? partial complex seizure  Brief episode of aphasia and right sided weakness at home  EEG: cortical dysfunction in left temporal region, No epileptiform discharges   On keppra 500mg  bid now  Cerebral Edema with minimal midline shift  Was on 3% saline, discontinued 3/20  Allow Na gradually trending down  Na 139->148->157->155->156->152->150  On TF and FW  Dysphagia  SLP mod barium: Dysphagia I diet and honey thick liquids with alternative means for medications recommended  Cortrak in place with TF  Nutrition consult for nocturnal tube feeds and calorie count  Osmolite 1.2 at 60 ml/h (600 ml per day) x 10 hours (8pm to 6am)  Encourage po intake  Free water 100 Q4h  Hypertension  Home meds:  None  Norvasc 10mg  daily, Iosartan 50mg  bid  Prn hydralazine  Keep systolic BP less than 277   Avoid hypotension   Long-term BP goal normotensive  Hyperlipidemia  Home meds:  none  LDL 159, goal < 70  Atorvastatin 80mg  daily  Continue statin at discharge  Hypokalemia  3.1->4.5->3.0->3.1  Replete with potassium chloride 40 meq  packet x2  Check level in am   Leukocytosis   WBC 10.8->9.6->12.7->10.8->11.1  Repeat UA negative  Other Stroke Risk Factors  Advanced Age >/= 5   Other Active Problems  2.3 cm right thyroid nodule. Recommend PCP followup and thyroid US  Lissy Olivencia-Simmons, ACNP-BC Stroke NP 11/01/2020        To contact Stroke Continuity provider, please refer to [email or web address] After hours, contact General  Neurology

## 2020-11-01 NOTE — Progress Notes (Addendum)
Inpatient Rehab Admissions Coordinator:    I have insurance approval and a bed available for pt to admit to CIR today. Awaiting confirmation from medical team that pt is ready to admit.  I will let pt/family and TOC team know.   Addendum: I have approval from stroke service.  Will admit to CIR today.   Shann Medal, PT, DPT Admissions Coordinator [phone] 11/01/20  11:22 AM

## 2020-11-02 LAB — GLUCOSE, CAPILLARY
Glucose-Capillary: 124 mg/dL — ABNORMAL HIGH (ref 70–99)
Glucose-Capillary: 125 mg/dL — ABNORMAL HIGH (ref 70–99)
Glucose-Capillary: 128 mg/dL — ABNORMAL HIGH (ref 70–99)
Glucose-Capillary: 124 mg/dL — ABNORMAL HIGH (ref 70–99)
Glucose-Capillary: 132 mg/dL — ABNORMAL HIGH (ref 70–99)

## 2020-11-02 LAB — COMPREHENSIVE METABOLIC PANEL
ALT: 43 U/L (ref 0–44)
AST: 42 U/L — ABNORMAL HIGH (ref 15–41)
Albumin: 3 g/dL — ABNORMAL LOW (ref 3.5–5.0)
Alkaline Phosphatase: 53 U/L (ref 38–126)
Anion gap: 8 (ref 5–15)
BUN: 23 mg/dL (ref 8–23)
CO2: 24 mmol/L (ref 22–32)
Calcium: 8.9 mg/dL (ref 8.9–10.3)
Chloride: 111 mmol/L (ref 98–111)
Creatinine, Ser: 0.71 mg/dL (ref 0.44–1.00)
GFR, Estimated: >60 mL/min (ref >60)
Glucose, Bld: 119 mg/dL — ABNORMAL HIGH (ref 70–99)
Potassium: 3.5 mmol/L (ref 3.5–5.1)
Sodium: 143 mmol/L (ref 135–145)
Total Bilirubin: 0.5 mg/dL (ref 0.3–1.2)
Total Protein: 5.9 g/dL — ABNORMAL LOW (ref 6.5–8.1)

## 2020-11-02 LAB — CBC WITH DIFFERENTIAL/PLATELET
Abs Immature Granulocytes: 0.1 10*3/uL — ABNORMAL HIGH (ref 0.00–0.07)
Basophils Absolute: 0.1 10*3/uL (ref 0.0–0.1)
Basophils Relative: 1 %
Eosinophils Absolute: 0.3 10*3/uL (ref 0.0–0.5)
Eosinophils Relative: 3 %
HCT: 38 % (ref 36.0–46.0)
Hemoglobin: 12.4 g/dL (ref 12.0–15.0)
Immature Granulocytes: 1 %
Lymphocytes Relative: 20 %
Lymphs Abs: 2.1 10*3/uL (ref 0.7–4.0)
MCH: 28.9 pg (ref 26.0–34.0)
MCHC: 32.6 g/dL (ref 30.0–36.0)
MCV: 88.6 fL (ref 80.0–100.0)
Monocytes Absolute: 0.7 10*3/uL (ref 0.1–1.0)
Monocytes Relative: 7 %
Neutro Abs: 7.3 10*3/uL (ref 1.7–7.7)
Neutrophils Relative %: 68 %
Platelets: 223 10*3/uL (ref 150–400)
RBC: 4.29 MIL/uL (ref 3.87–5.11)
RDW: 13.1 % (ref 11.5–15.5)
WBC: 10.6 10*3/uL — ABNORMAL HIGH (ref 4.0–10.5)
nRBC: 0 % (ref 0.0–0.2)

## 2020-11-02 MED ORDER — OSMOLITE 1.2 CAL PO LIQD: 600.0000 mL | ORAL | Status: DC

## 2020-11-02 MED ORDER — OSMOLITE 1.2 CAL PO LIQD
1000.0000 mL | ORAL | Status: AC
  Administered 2020-11-03: 237 mL
  Filled 2020-11-02: qty 1000

## 2020-11-02 NOTE — Progress Notes (Signed)
[facility] Individual Statement of Services  Patient Name:  Brittany Griffith  Date:  11/02/2020  Welcome to the [facility].  Our goal is to provide you with an individualized program based on your diagnosis and situation, designed to meet your specific needs.  With this comprehensive rehabilitation program, you will be expected to participate in at least 3 hours of rehabilitation therapies Monday-Friday, with modified therapy programming on the weekends.  Your rehabilitation program will include the following services:  Physical Therapy (PT), Occupational Therapy (OT), Speech Therapy (ST), 24 hour per day rehabilitation nursing, Therapeutic Recreaction (TR), Neuropsychology, Care Coordinator, Rehabilitation Medicine, Nutrition Services, Pharmacy Services and Other  Weekly team conferences will be held on Wednesday to discuss your progress.  Your Inpatient Rehabilitation Care Coordinator will talk with you frequently to get your input and to update you on team discussions.  Team conferences with you and your family in attendance may also be held.  Expected length of stay: 9-12 Days  Overall anticipated outcome: Supervision to Min a  Depending on your progress and recovery, your program may change. Your Inpatient Rehabilitation Care Coordinator will coordinate services and will keep you informed of any changes. Your Inpatient Rehabilitation Care Coordinator's name and contact numbers are listed  below.  The following services may also be recommended but are not provided by the [facility]:    [place] will be made to provide these services after discharge if needed.  Arrangements include referral to agencies that provide these services.  Your insurance has been verified to be:  [employer] Your primary doctor is:  [facility]  Pertinent  information will be shared with your doctor and your insurance company.  Inpatient Rehabilitation Care Coordinator:  Erlene Quan, [place] or [phone]  Information discussed with and copy given to patient by: Dyanne Iha, 11/02/2020, 11:55 AM

## 2020-11-02 NOTE — Progress Notes (Signed)
Calorie Count Note: Day 1 Results  48-hour calorie count ordered. Calorie count started yesterday (3/22) at breakfast meal. Please see day 1 results below. RD will follow up tomorrow to provide day 2 results.  Diet: dysphagia 1 with honey-thick liquids Supplements: - Magic Cup BID with meals  11/01/20: Breakfast: 627 kcal, 18 grams of protein Lunch: 240 kcal, 12 grams of protein Dinner: 371 kcal, 22 grams of protein  Day 1 total 24-hour intake: 1238 kcal (73% of minimum estimated needs)  52 grams of protein (61% of minimum estimated needs)  Nutrition Diagnosis: Increased nutrient needs related to other (extensive therapies) as evidenced by estimated needs.  Goal: Patient will meet greater than or equal to 90% of their needs  Intervention: - Continue calorie count through the end of today - Continue nocturnal tube feeds of Osmolite 1.2 @ 60 ml/hr x 10 hours from 1800 to 0400 with ProSource TF 45 ml BID and free water flushes - Magic Cup TID with meals   [name], MS, RD, LDN Inpatient Clinical Dietitian Please see [facility] for contact information.

## 2020-11-02 NOTE — Patient Care Conference (Signed)
Inpatient RehabilitationTeam Conference and Plan of Care Update Date: 11/02/2020   Time: 10:32 AM    Patient Name: Brittany Griffith      Medical Record Number: [id number]  Date of Birth: 19-Aug-1950 Sex: Female         Room/Bed: 4W26C/4W26C-01 Payor Info: Payor: [employer] MEDICARE / Plan: [employer] MEDICARE HMO/PPO / Product Type: *No Product type* /    Admit Date/Time:  11/01/2020  2:44 PM  Primary Diagnosis:  Global aphasia  Hospital Problems: Principal Problem:   Global aphasia Active Problems:   left MCA stroke   Meningioma ([facility])   Cerebral edema ([facility])   Hypertensive urgency   Left middle cerebral artery stroke [facility])   Dysphagia    Expected Discharge Date: Expected Discharge Date:  (Initial evals pending ELOS 2.5 weeks)  Team Members Present: Physician leading conference: Dr. Leeroy Cha Care Coodinator Present: Dorien Chihuahua, RN, BSN, CRRN;Christina Hobart, BSW Nurse Present: Dorien Chihuahua, RN OT Present: Clyda Greener, OT SLP Present: Jettie Booze, CF-SLP PPS Coordinator present : Ileana Ladd, PT     Current Status/Progress Goal Weekly Team Focus  Bowel/Bladder   incontinent of bladder. last B3/22  become continent of bladder  assess q shift and PRN   Swallow/Nutrition/ Hydration   eval pending         ADL's             Mobility             Communication   eval pending         Safety/Cognition/ Behavioral Observations  eval pending         Pain   No c/o pain  Pain<3  assess pain q shift and PRN   Skin   eccymosis to bilateral arms and legs, abdomen.  no new breakdowns  assess q shift and PRN     Discharge Planning:  Pending assesment   Team Discussion: Patient anti-medication prior to admission and spouse/dtr are leery of use of amatidine or like medication as recommended by MD. Sodium trending down to WNL with  WBC and K+ up; labs look good.   Patient on target to meet rehab goals: yes, currently max assist for bathing and dressing at the sink and  total assist +2 for sit-stand. Trace movement in right lower extremity. Progress limited by right inattention, global aphasia, and questionable visual deficit. Max assist for squat-pivot transfers. Voicing some spontaneously however not on command. Apraxia and oral motor issues lead to difficulty with expression however able to ID objects visually.  *See Care Plan and progress notes for long and short-term goals.   Revisions to Treatment Plan:  D2 trials; continue with D1/Honey diet for now due to pocketing on right and silent aspiration noted on MBS 10/28/20.  Teaching Needs: Nutritional means, medications, secondary risk management, toileting, transfers, etc.   Current Barriers to Discharge: Decreased caregiver support, Home enviroment access/layout, Incontinence and Nutritional means  Possible Resolutions to Barriers: Family education; supportive family and spouse stays 24/7.     Medical Summary Current Status: Severe aphasia, hyponatremia improving, kypokalemia improving, leukocytosis improving, right sided inattention, neglect  Barriers to Discharge: Medical stability  Barriers to Discharge Comments: Severe aphasia, hyponatremia improving, kypokalemia improving, leukocytosis improving, right sided inattention, neglect Possible Resolutions to [employer] Focus: Discussed with daughter and husband starting Ritalin vs. Amantadine for neurostimulation, monitor labs weekly.   Continued Need for Acute Rehabilitation Level of Care: The patient requires daily medical management by a physician with specialized training in physical  medicine and rehabilitation for the following reasons: Direction of a multidisciplinary physical rehabilitation program to maximize functional independence : Yes Medical management of patient stability for increased activity during participation in an intensive rehabilitation regime.: Yes Analysis of laboratory values and/or radiology reports with any subsequent  need for medication adjustment and/or medical intervention. : Yes   I attest that I was present, lead the team conference, and concur with the assessment and plan of the team.   Dorien Chihuahua B 11/02/2020, 2:29 PM

## 2020-11-02 NOTE — Progress Notes (Signed)
PMR Admission Coordinator Pre-Admission Assessment   Patient: Brittany Griffith is an 70 y.o., female MRN: [id number] DOB: 1950-08-14 Height: 5' 2"  (157.5 cm) Weight: 57.8 kg                                                                                                                                                  Insurance Information HMO:     PPO: yes     PCP:      IPA:      80/20:      OTHER:  PRIMARY: [employer] Medicare      Policy#: [id number]      Subscriber: pt CM Name: Ubaldo Glassing  Phone#: [phone] Fax#: [phone] Pre-Cert#: [id number] auth for CIR provided by Lattie Haw with [employer]. Approved for 5 days with updates due to [place] at fax listed above on 11/03/20      Employer: n/a Benefits:  Phone #: [phone]     Name:  Eff. Date: 08/13/20     Deduct: $0      Out of Pocket Max: $4500 ($0 met)       Life Max: n/a  CIR: $295/day for days 1-6      SNF: $188/day Outpatient:      Co-Pay: $35/visit Home Health: 100%      Co-Pay:  DME: 80%     Co-Pay: 20% Providers:  SECONDARY:       Policy#:       Phone#:    Development worker, community:       Phone#:    [facility] for patients in Inpatient Rehabilitation Facilities with attached Privacy Act [place] Records was provided and verbally reviewed with: Family   Emergency Contact Information         Contact Information     Name Relation Home Work [place], [place] Spouse     [phone]    Lia Foyer     [phone]       Current Medical History  Patient Admitting Diagnosis: L MCA CVA   History of Present Illness: Brittany Griffith is a 70 y.o. right-handed limited English speaking female with history of hyperlipidemia as well as hypertension.  Presented 10/23/2020 with right side weakness, headache and slurred speech.  Noted blood pressure 200/77.  CT angiogram of head and neck showed left frontal parafalcine mass with extensive regional hyperostosis possibly reflecting en plaque  meningioma.  Mild edema in the left frontal lobe.  Trace local rightward midline shift.  No large vessel occlusion.  MRI showed subcentimeter acute left MCA infarct in the left insula and parietal lobe.  2.6 cm enhancing left frontal parafalcine mass consistent with meningioma minimal rightward midline shift.  Neurosurgery Dr. Venetia Constable did follow-up in regards to meningioma felt to be slow-growing and would follow-up outpatient after initial evaluation by neurology services for CVA.  Current plan is for cerebral arteriogram 10/25/2020 which demonstrated L MCA occlusion.  Pt underwent revascularization of L MCA on 3/16 per Dr. Patrecia Pour.  Repeat imaging on 3/17 due to persistent RUE weakness demonstrated re-occlusion of MCA.  Pt did require max doses of Cleviprex for BP control.  Maintained on aspirin and Plavix for CVA.  EEG negative for seizure.  Keppra added for seizure prophylaxis.  Echocardiogram with ejection fraction of 60 to 65% no wall motion abnormalities.  Therapy evaluations completed with recommendations physical medicine rehab consult due to right side weakness and speech difficulty.    Complete NIHSS TOTAL: 17 Glasgow Coma Scale Score: 11   Past Medical History      Past Medical History:  Diagnosis Date   Hypercholesteremia     Hypertension     Osteopenia        Family History  family history includes Heart attack in her father; Kidney disease in her brother; Stroke in her father.   Prior Rehab/Hospitalizations:  Has the patient had prior rehab or hospitalizations prior to admission? No   Has the patient had major surgery during 100 days prior to admission? Yes   Current Medications    Current Facility-Administered Medications:    acetaminophen (TYLENOL) tablet 650 mg, 650 mg, Oral, Q4H PRN **OR** acetaminophen (TYLENOL) 160 MG/5ML solution 650 mg, 650 mg, Per Tube, Q4H PRN, 650 mg at 10/28/20 2140 **OR** acetaminophen (TYLENOL) suppository 650 mg, 650 mg, Rectal, Q4H PRN,  Lavina Hamman, MD   amLODipine (NORVASC) tablet 10 mg, 10 mg, Per Tube, Daily, Bailey-Modzik, Delila A, NP, 10 mg at 10/31/20 1011   aspirin chewable tablet 81 mg, 81 mg, Oral, Daily **OR** aspirin chewable tablet 81 mg, 81 mg, Per Tube, Daily, Deveshwar, Sanjeev, MD, 81 mg at 10/31/20 1011   atorvastatin (LIPITOR) tablet 80 mg, 80 mg, Per Tube, Daily, Blenda Nicely, RPH, 80 mg at 10/31/20 1011   chlorhexidine (PERIDEX) 0.12 % solution 15 mL, 15 mL, Mouth Rinse, BID, Deveshwar, Sanjeev, MD, 15 mL at 10/31/20 2135   Chlorhexidine Gluconate Cloth 2 % PADS 6 each, 6 each, Topical, Daily, Hall, Carole N, DO, 6 each at 10/30/20 1013   enoxaparin (LOVENOX) injection 40 mg, 40 mg, Subcutaneous, Q24H, Rosalin Hawking, MD, 40 mg at 10/31/20 2129   feeding supplement (OSMOLITE 1.2 CAL) liquid 600 mL, 600 mL, Per Tube, Q24H, Rosalin Hawking, MD, Last Rate: 60 mL/hr at 10/31/20 2118, 600 mL at 10/31/20 2118   feeding supplement (PROSource TF) liquid 45 mL, 45 mL, Per Tube, BID, Rosalin Hawking, MD, 45 mL at 10/31/20 2135   free water 100 mL, 100 mL, Per Tube, Q4H, Rosalin Hawking, MD, 100 mL at 11/01/20 0411   hydrALAZINE (APRESOLINE) injection 5-10 mg, 5-10 mg, Intravenous, Q4H PRN, Rosalin Hawking, MD, 10 mg at 10/31/20 1914   insulin aspart (novoLOG) injection 0-9 Units, 0-9 Units, Subcutaneous, Q4H, Donnetta Simpers, MD, 1 Units at 11/01/20 0408   levETIRAcetam (KEPPRA) 100 MG/ML solution 500 mg, 500 mg, Per Tube, BID, Cruzita Lederer, Costin M, MD, 500 mg at 10/31/20 2133   losartan (COZAAR) tablet 50 mg, 50 mg, Per Tube, BID, Olivencia-Simmons, Ivelisse, NP, 50 mg at 10/31/20 2133   MEDLINE mouth rinse, 15 mL, Mouth Rinse, q12n4p, Deveshwar, Sanjeev, MD, 15 mL at 10/31/20 1753   polyethylene glycol (MIRALAX / GLYCOLAX) packet 17 g, 17 g, Per Tube, Daily PRN, Blenda Nicely, RPH, 17 g at 10/27/20 1813   potassium chloride (KLOR-CON) packet 40 mEq, 40 mEq,  Per Tube, Q4H, Olivencia-Simmons, Ivelisse, NP    Sara Lee, , Oral, PRN, Garvin Fila, MD   senna-docusate (Senokot-S) tablet 1 tablet, 1 tablet, Per Tube, QHS, Blenda Nicely, RPH, 1 tablet at 10/31/20 2133   ticagrelor (BRILINTA) tablet 90 mg, 90 mg, Oral, BID **OR** ticagrelor (BRILINTA) tablet 90 mg, 90 mg, Per Tube, BID, Deveshwar, Sanjeev, MD, 90 mg at 10/31/20 2133   Patients Current Diet:     Diet Order                      DIET - DYS 1 Room service appropriate? No; Fluid consistency: Honey Thick  Diet effective now                      Precautions / Restrictions Precautions Precautions: Fall Precaution Comments: R neglect Restrictions Weight Bearing Restrictions: No    Has the patient had 2 or more falls or a fall with injury in the past year?No   Prior Activity Level Community (5-7x/wk): working part time, driving, and active in her community, no DME at baseline   Prior Functional Level Prior Function Level of Independence: Independent 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, driving, independent and very active in [place]: Did the patient need help bathing, dressing, using the toilet or eating?  Independent   Indoor Mobility: Did the patient need assistance with walking from room to room (with or without device)? Independent   Stairs: Did the patient need assistance with internal or external stairs (with or without device)? Independent   Functional Cognition: Did the patient need help planning regular tasks such as shopping or remembering to take medications? Independent   Home Assistive Devices / Equipment Home Assistive Devices/Equipment: None Home Equipment: None   Prior Device Use: Indicate devices/aids used by the patient prior to current illness, exacerbation or injury? noen   Current Functional Level Cognition   Arousal/Alertness: Awake/alert Overall Cognitive Status: Difficult to assess Difficult to assess due to: Impaired communication Current Attention Level:  Sustained Orientation Level: Other (comment) (unable to assess) Following Commands: Follows one step commands inconsistently,Follows one step commands with increased time Safety/Judgement: Decreased awareness of deficits,Decreased awareness of safety General Comments: Difficult to assess due to decreased verbalization, consistent with yes/no when asked simple questions. Pt moving impulsively at times, but redirectable. Attention: Sustained Sustained Attention: Appears intact Memory:  (unable to assess secondary to aphasia) Awareness: Impaired Awareness Impairment: Intellectual impairment Problem Solving: Impaired Problem Solving Impairment: Functional basic Executive Function: Sequencing Sequencing: Impaired Sequencing Impairment: Functional basic    Extremity Assessment (includes Sensation/Coordination)   Upper Extremity Assessment: Defer to OT evaluation RUE Deficits / Details: Trace active movement. Increased edema. WFL for PROM RUE Sensation: decreased proprioception,decreased light touch RUE Coordination: decreased fine motor,decreased gross motor  Lower Extremity Assessment: RLE deficits/detail RLE Deficits / Details: grossly 3/5, pt able to move against gravity with visual cues, unable to complete MMT due to inconsistent command following. R knee buckling in stance initially, progressed to minA to stand without buckling     ADLs   Overall ADL's : Needs assistance/impaired Eating/Feeding: Moderate assistance,Sitting Eating/Feeding Details (indicate cue type and reason): Mod A for bilateral coorindation due to decreased functional use of RUE. pt using spoon to scoop ice cream and bring to mouth Grooming: Wash/dry hands,Wash/dry face,Moderate assistance Grooming Details (indicate cue type and reason): standing at sink able to engage in washing hands with min assist for task sequencing/R hand attention,  min-mod assist for balance and perseverating on washing face (bed level continues  to perseverate on washing face and requied max assist to wash hands) Toilet Transfer: +2 for physical assistance,RW,Minimal assistance,Ambulation (simualted to recliner) Functional mobility during ADLs: Moderate assistance,+2 for physical assistance,+2 for safety/equipment,Cueing for sequencing,Maximal assistance General ADL Comments: Pt demonsatrating high motivation. Performing functional mobility with Mod-Max A +2 and then participating in self feeding while sitting in recliner     Mobility   Overal bed mobility: Needs Assistance Bed Mobility: Rolling,Sidelying to Sit Rolling: Mod assist Sidelying to sit: Mod assist Supine to sit: Supervision Sit to supine: Min assist General bed mobility comments: modA to roll, pt able to complete lateral movement of BLE x1, needed minA on second attempt to move. modA to elevate trunk from elevated HOB     Transfers   Overall transfer level: Needs assistance Equipment used: 2 person hand held assist Transfers: Sit to/from [place] to Stand: +2 physical assistance,+2 safety/equipment,Min assist Stand pivot transfers: Mod assist,+2 safety/equipment,+2 physical assistance General transfer comment: minA to power up, blocking R knee pt able to maintain with BUE support and blocking to R knee     Ambulation / Gait / Stairs / Wheelchair Mobility   Ambulation/Gait Ambulation/Gait assistance: Max assist,Mod assist,+2 physical assistance Gait Distance (Feet): 4 Feet (+ 8 ft) Assistive device: 2 person hand held assist Gait Pattern/deviations: Step-to pattern,Decreased step length - left,Decreased stance time - right,Decreased weight shift to right General Gait Details: pt relying on +2 to steady, blocking of R knee with stance, modA to advance RLE with each step but pt able to initiate hip flexion Gait velocity: decr Gait velocity interpretation: <1.31 ft/sec, indicative of household ambulator     Posture / Balance Dynamic Sitting  Balance Sitting balance - Comments: can maintain static sitting without LE support, however, slight impuslivity today leaning outside BOS which requires minA to return to upright as pt is unable to initiate correction Balance Overall balance assessment: Needs assistance Sitting-balance support: No upper extremity supported,Feet supported Sitting balance-Leahy Scale: Fair Sitting balance - Comments: can maintain static sitting without LE support, however, slight impuslivity today leaning outside BOS which requires minA to return to upright as pt is unable to initiate correction Postural control: Right lateral lean Standing balance support: No upper extremity supported Standing balance-Leahy Scale: Poor Standing balance comment: Reliant on physical A     Special needs/care consideration Special service needs interpreter        Previous Home Environment (from acute therapy documentation) Living Arrangements: Spouse/significant other,Children  Lives With: Spouse Available Help at Discharge: Family,Available 24 hours/day Type of Home: House Home Layout: One level Home Access: Stairs to enter Entrance Stairs-Rails: None Entrance Stairs-Number of Steps: 1 Bathroom Shower/Tub: Art therapist: Programmer, systems: Yes Home Care Services: No Additional Comments: family reports spouse was working but not plans to stay with pt   Discharge Living Setting Plans for Discharge Living Setting: Patient's home Type of Home at Discharge: House Discharge Home Layout: One level Discharge Home Access: Stairs to enter Entrance Stairs-Rails: None Entrance Stairs-Number of Steps: 1 Discharge Bathroom Shower/Tub: Pharmacologist: Standard Discharge Bathroom Accessibility: Yes How Accessible: Accessible via walker Does the patient have any problems obtaining your medications?: No   Social/Family/Support  Systems Patient Roles: Spouse Anticipated Caregiver: Than Wyline Copas (spouse); Javier Docker (son) Anticipated Caregiver's Contact Information: Than [phone] Salles [phone] Ability/Limitations of Caregiver: n/a Caregiver Availability: 24/7 Discharge Plan Discussed with  Primary Caregiver: Yes Is Caregiver In Agreement with Plan?: Yes Does Caregiver/Family have Issues with Lodging/Transportation while Pt is in Rehab?: No     Goals Patient/Family Goal for Rehab: PT/OT supervision to min assist, SLP min assist Expected length of stay: 21-24 days Cultural Considerations: Nepali is first language (speaks 3 fluently, including English, plus 2 more partially) Additional Information: Plan for [place] interpreter at first Pt/Family Agrees to Admission and willing to participate: Yes Program Orientation Provided & Reviewed with Pt/Caregiver Including Roles  & Responsibilities: Yes  Barriers to Discharge: Insurance for SNF coverage     Decrease burden of Care through IP rehab admission: n/a   Possible need for SNF placement upon discharge: Not anticipated.  Reviewed goals of CIR admission, as well as expectation that pt will require 24/7 supervision/assist at discharge and family prepared to provide this level of care.      Patient Condition: This patient's medical and functional status has changed since the consult dated: 3/15 in which the Rehabilitation Physician determined and documented that the patient's condition is appropriate for intensive rehabilitative care in an inpatient rehabilitation facility. See "History of Present Illness" (above) for medical update. Functional changes are: pt max +2 for transfers. Patient's medical and functional status update has been discussed with the Rehabilitation physician and patient remains appropriate for inpatient rehabilitation. Will admit to inpatient rehab today.   Preadmission Screen Completed By:  Michel Santee, PT, 11/01/2020 11:32  AM ______________________________________________________________________   Discussed status with Dr. Dagoberto Ligas on 11/01/20 at 11:32 AM  and received approval for admission today.   Admission Coordinator:   Michel Santee, time 11:32 AM Sudie Grumbling 11/01/20          Cosigned by: Courtney Heys, MD at 11/01/2020 12:03 PM

## 2020-11-02 NOTE — Progress Notes (Signed)
Patient information reviewed and entered into [facility] System by Becky Zakeya Junker, PPS coordinator. Information including medical coding, function ability, and quality indicators will be reviewed and updated through discharge.   

## 2020-11-02 NOTE — Progress Notes (Signed)
Continuous feeding d/c 3/23 and set to restart 3/24 2000-0600 for 10 hr feeds; clarified w/ on-call date to start 10 hr feeds, and verbal order given to start 3/23.

## 2020-11-02 NOTE — Evaluation (Signed)
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[facility])   Dysphagia  Past Medical History:  Past Medical History:  Diagnosis Date   Hypercholesteremia    Hypertension    Osteopenia    Past Surgical History:  Past Surgical History:  Procedure Laterality Date   IR ANGIO INTRA EXTRACRAN SEL COM CAROTID INNOMINATE UNI R MOD SED  10/26/2020   IR INTRA CRAN STENT  10/26/2020   RADIOLOGY WITH ANESTHESIA Left 10/26/2020   Procedure: RADIOLOGY WITH ANESTHESIA LEFT MCA STENT;  Surgeon: Luanne Bras, MD;  Location: [facility];  Service: Radiology;  Laterality: Left;    Assessment / Plan / Recommendation Clinical Impression   HPI: Brittany Griffith is a 70 year old right-handed limited English speaking female with history of hyperlipidemia as well as hypertension.  Per chart review lives with spouse and independent prior to admission.  1 level home one-step to entry.  Presented 10/23/2020 with right side weakness, headache and slurred speech.  Noted blood pressure 200/77.  CT angiogram of head and neck showed left frontal parafalcine mass with extensive regional hyperostosis possibly reflecting en plaque meningioma.  Mild edema in left frontal lobe.  Trace local rightward midline shift.  No large vessel occlusion.  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  2.6 cm enhancing left frontal parafalcine mass consistent with meningioma minimal rightward  midline shift.  Neurosurgery Dr. Venetia Constable follow-up in regards to meningioma felt to be slow-growing and would follow-up outpatient after initial evaluation by neurology services for CVA.  EEG negative for seizure and maintained on Keppra for seizure prophylaxis..  Echocardiogram with ejection fraction of 60 to 65% no wall motion abnormalities.  Patient underwent cerebral angiogram 10/26/2020 followed by stent assisted angioplasty of left MCA with  TTICI revascularization per interventional radiology.  Currently maintained on aspirin 81 mg daily and Brilinta 90 mg twice daily post stenting.  Patient was cleared to begin Lovenox for DVT prophylaxis.  She was maintained on 3% saline for her cerebral edema and monitoring of sodium levels.  On 10/30/2020 patient with questionable increasing weakness right upper extremity MRI/MRA completed showing considerable extension of the left middle cerebral artery territory infarction involving the vast majority of the cortical and subcortical distribution of the left middle cerebral artery with sparing of the basal ganglia and patient remained on aspirin as well as Brilinta..  Currently on a dysphagia #1 honey thick liquid diet with nasogastric tube feeds for nutritional support.  Therapy evaluations completed due to patient decreased functional ability, right side weakness and slurred speech was admitted for a comprehensive rehab program 11/01/20 and SLP evluations were completed 11/02/20 with results as follows:  Pt presents with moderate oral and pharyngeal dysphagia, based on today's clinical swallow evaluation and recent MBSS completed on 10/28/20. Although no overt s/sx aspiration noted across PO trials today, per MBSS results, pt has a hx of silent aspiration of nectar and thin liquids, which would make it difficult to determine aspiration at bedside. She still presents with oral apraxia,  resulting in mild oral holding of boluses at times, although appears to be improving.  Honey and puree textures were consumed most efficiently today. Dys 2 (minced/ground) trials resulted in a significant increase in right buccal pocketing, which was very difficult for pt to clear due to oral apraxia. She was unable to follow most oral motor commands (can only produce open mouth posture on command at this time). Spoonfuls of honey liquid did aid in some clearance of pocketing. Conservative ice chip trials administered after oral care provided, which were consumed without s/sx aspiration but unable to keep bolus on left side of oral cavity and consistent right anterior loss noted. Recommend continue current diet with full supervision for now.   Pt did not produce any voicing or verbalizations throughout session today, despite her visible attempts. Pt's husband reported the pt is able to spontaneously produce voice when crying, laughing or coughing, however unable to produce on command at this time despite Max A multimodal cueing (attempted cough, grunt, sigh, vowel sounds). She did open her mouth when attempting "ah", and approximated lip rounding when attempting "oo". Pt's yes/no's are somewhat inconsistent, and mostly answers yes, however ~80% accurate today with basic biographical, orientation, and and environmental questions. Pt also able to identify objects from a field of 2 via pointing with 90% accuracy. In a more complex task evaluating her potential to use a communication board for basic wants and needs, she was ~50% accurate but demonstrated good indicators that she may be able to use a communication board with more training. She attended to session well, without cueing for redirection. She demonstrated some signs that cognitive processing is somewhat delayed.   Recommend pt receive skilled ST services to address dysphagia and communication goals while inpatient in order to maximize her functional independence and safety as well as diet safety and efficiency prior to discharge. Given  severity of swallow and speech/language deficits, cognitive goals will not be directly addressed at this time, and would be more effectively targeted at next venue of care once speech improves. Pt and husband in agreement with treatment plan.    Skilled Therapeutic Interventions          Bedside swallow and cognitive-linguistic evaluations were administered and results were reviewed with pt (please see above for details regarding the results). Pt speaks Nepali and Hindi; pt's husband and a hired Veterinary surgeon were present throughout evaluation to translate session into pt's preferred language.    SLP Assessment  Patient will need skilled Speech Lanaguage Pathology Services during CIR admission    Recommendations  SLP Diet Recommendations: Dysphagia 1 (Puree);Honey Liquid Administration via: Cup;Spoon;Straw Medication Administration: Crushed with puree Supervision: Patient able to self feed;Staff to assist with self feeding;Full supervision/cueing for compensatory strategies Compensations: Slow rate;Minimize environmental distractions;Small sips/bites;Lingual sweep for clearance of pocketing;Other (Comment) (give sips of liquid to assist with oral clearance, likely needs oral care as well to help clear residuals at end of intake) Postural Changes and/or Swallow Maneuvers: Seated upright 90 degrees Oral Care Recommendations: Oral care BID;Other (Comment) (after PO) Patient destination: Home Follow up Recommendations: Home Health SLP;24 hour supervision/assistance Equipment Recommended: None recommended by SLP    SLP Frequency 3 to 5 out of 7 days   SLP Duration  SLP Intensity  SLP Treatment/Interventions 3.5-4 weeks  Minumum of 1-2 x/day, 30 to 90 minutes  Cognitive remediation/compensation;Cueing hierarchy;Dysphagia/aspiration precaution training;Functional tasks;Patient/family education;Therapeutic Activities;Speech/Language facilitation;Multimodal communication approach;Internal/external  aids    Pain Pain Assessment Pain Scale: 0-10 Pain Score: 0-No pain  SLP Evaluation Cognition Overall Cognitive Status: Difficult to assess Arousal/Alertness: Awake/alert Orientation Level: Oriented to person;Disoriented to place (via yes/no gestures (accomodations provided for expressive language deficits/apraxia of speech)) Attention: Sustained Sustained Attention: Appears intact Memory:  (UTA due to speech/language impairments) Awareness: Impaired Awareness Impairment: Emergent impairment Problem Solving: Impaired Problem Solving Impairment: Functional basic Executive Function: Sequencing Sequencing: Impaired Sequencing Impairment: Functional basic Safety/Judgment: Impaired  Comprehension Auditory Comprehension Overall Auditory Comprehension: Impaired Yes/No Questions: Impaired Basic Biographical Questions: 76-100% accurate Basic Immediate Environment Questions: 50-74% accurate Commands: Impaired One Step Basic Commands: 25-49% accurate Interfering Components: Processing speed EffectiveTechniques: Extra processing time;Visual/Gestural cues Visual Recognition/Discrimination Discrimination: Not tested Reading Comprehension Reading Status: Not tested Expression Expression Primary Mode of Expression: Verbal Verbal Expression Overall Verbal Expression: Impaired Initiation: Impaired Automatic Speech:  (none) Level of Generative/Spontaneous Verbalization:  (no voicing or verbalizations observed today) Repetition: Impaired Level of Impairment:  (none) Naming: Impairment Responsive: Not tested Confrontation: Impaired Convergent: Not tested Divergent: Not tested Other Naming Comments: unable to imitate vowels or produce more automatic voicing exercises at this time (cough, grunt, sigh) Pragmatics: No impairment Non-Verbal Means of Communication: Gestures Written Expression Written Expression: Not tested Oral Motor Oral Motor/Sensory Function Overall Oral  Motor/Sensory Function: Mild impairment Facial ROM: Reduced right Facial Symmetry: Within Functional Limits Facial Strength: Reduced right Lingual ROM: Reduced right Lingual Symmetry: Abnormal symmetry right Lingual Strength: Within Functional Limits Mandible: Within Functional Limits Motor Speech Overall Motor Speech: Other (comment) (unable to assess - no voicing noted during eval) Respiration: Within functional limits Phonation: Other (comment) (none observed) Motor Planning: Impaired Level of Impairment: Word Motor Speech Errors: Groping for words  Care Tool Care Tool Cognition Expression of Ideas and Wants Expression of Ideas and Wants: Rarely/Never expressess or very difficult - rarely/never expresses self or speech is very difficult to understand   Understanding Verbal and Non-Verbal Content Understanding Verbal and Non-Verbal Content: Sometimes understands - understands only basic conversations or simple, direct phrases. Frequently requires cues to understand   Memory/Recall Ability *first 3 days only Memory/Recall Ability *first 3 days only: That he or she is in a hospital/hospital unit (accomodations made for speech/language deficits)        Bedside Swallowing Assessment General Date of Onset: 10/23/20 Previous Swallow Assessment: MBSS 10/28/20 Diet Prior to this Study: Dysphagia 1 (puree);Honey-thick liquids Temperature Spikes Noted: No Respiratory Status: Room air History of Recent Intubation: Yes Length of Intubations (days):  (procedure only) Date extubated: 10/26/20 Behavior/Cognition: Alert;Cooperative;Pleasant mood;Requires cueing Oral Cavity - Dentition: Adequate natural dentition Self-Feeding Abilities: Needs assist;Able to feed self Vision: Functional for self-feeding Patient Positioning: Upright in bed Baseline Vocal Quality: Not observed Volitional Cough: Cognitively unable to elicit (oral apraxia) Volitional Swallow: Unable to elicit  Oral Care  Assessment Does patient have any of the following "high(er) risk" factors?: None of the above Does patient have any of the following "at risk" factors?: Diet - patient on thickened liquids;Tongue - coated;Other - dysphagia Patient is AT RISK: Order set for Adult Oral Care Protocol initiated -  "At Risk Patients" option selected (see row information) Ice Chips Ice chips: Impaired Presentation: Spoon Oral Phase Impairments: Reduced labial seal;Reduced lingual movement/coordination Oral Phase Functional Implications: Right anterior spillage;Oral holding Pharyngeal Phase Impairments: Multiple swallows;Suspected delayed Swallow Thin Liquid Thin Liquid: Not tested Nectar Thick Nectar Thick Liquid: Not tested Honey Thick Honey Thick Liquid: Impaired Oral Phase Impairments: Reduced labial seal Oral Phase Functional Implications: Right anterior spillage Puree Puree: Impaired Presentation: Spoon;Self Fed Oral Phase Impairments: Reduced labial  seal;Reduced lingual movement/coordination Oral Phase Functional Implications: Oral holding Solid Solid: Impaired Oral Phase Impairments: Reduced labial seal;Reduced lingual movement/coordination Oral Phase Functional Implications: Prolonged oral transit;Right anterior spillage;Right lateral sulci pocketing BSE Assessment Risk for Aspiration Impact on safety and function: Moderate aspiration risk Other Related Risk Factors: Cognitive impairment  Short Term Goals: Week 1: SLP Short Term Goal 1 (Week 1): Pt will demonstrate efficient mastication and oral clearance of current diet Dys 1 textures, honey thick liquids with Max A cues for use of compensatory swallow strategies. SLP Short Term Goal 2 (Week 1): Pt will consume Dys 2 texture trials with minimal right buccal pocketing and no more than Moderate cues required to clear X3 prior to upgrade. SLP Short Term Goal 3 (Week 1): Pt will consume therapeutic trials of ice and or thin H2O via spoon without  decline in respiratory status or other vitals across 1 week prior to repeat MBSS. SLP Short Term Goal 4 (Week 1): Pt will voice on command in 25% of opportunities with Max A multimodal cueing. SLP Short Term Goal 5 (Week 1): Pt will imitate oral motor commands with 25% accuracy with Max A multimodal cueing. SLP Short Term Goal 6 (Week 1): Pt will communicate basic wants and needs via gestures and/or communication board with 75% accuracy with Max A multimodal cueing.  Refer to Care Plan for Long Term Goals  Recommendations for other services: None   Discharge Criteria: Patient will be discharged from SLP if patient refuses treatment 3 consecutive times without medical reason, if treatment goals not met, if there is a change in medical status, if patient makes no progress towards goals or if patient is discharged from hospital.  The above assessment, treatment plan, treatment alternatives and goals were discussed and mutually agreed upon: by patient and her husband, Lucio Edward 11/02/2020, 10:47 AM

## 2020-11-02 NOTE — Progress Notes (Signed)
Physical Medicine and Rehabilitation Consult Reason for Consult: Right side weakness and slurred speech Referring Physician: [facility]     HPI: Brittany Griffith is a 70 y.o. right-handed limited English speaking female with history of hyperlipidemia as well as hypertension.  Per chart review patient lives with spouse.  Independent prior to admission.  1 level home one-step to entry.  Presented 10/23/2020 with right side weakness, headache and slurred speech.  Noted blood pressure 200/77.  CT angiogram of head and neck showed left frontal parafalcine mass with extensive regional hyperostosis possibly reflecting en plaque meningioma.  Mild edema in the left frontal lobe.  Trace local rightward midline shift.  No large vessel occlusion.  MRI showed subcentimeter acute left MCA infarct in the left insula and parietal lobe.  2.6 cm enhancing left frontal parafalcine mass consistent with meningioma minimal rightward midline shift.  Neurosurgery Dr. Venetia Constable did follow-up in regards to meningioma felt to be slow-growing and would follow-up outpatient after initial evaluation by neurology services for CVA.  Current plan is for cerebral arteriogram 10/25/2020 with results currently pending.  Maintained on aspirin and Plavix for CVA.  EEG negative for seizure.  Keppra added for seizure prophylaxis.  Echocardiogram with ejection fraction of 60 to 65% no wall motion abnormalities.  Therapy evaluations completed with recommendations physical medicine rehab consult due to right side weakness and speech difficulty. Discussed CIR with son and daughter at bedside.     Review of Systems  Unable to perform ROS: Language        Past Medical History:  Diagnosis Date  . Hypercholesteremia    . Hypertension    . Osteopenia      History reviewed. No pertinent surgical history.      Family History  Problem Relation Age of Onset  . Stroke Father    . Heart attack Father    . Kidney disease Brother      Social  History:  reports that she has never smoked. She has never used smokeless tobacco. No history on file for alcohol use and drug use. Allergies:       Allergies  Allergen Reactions  . Penicillins        unk reaction          Medications Prior to Admission  Medication Sig Dispense Refill  . Multiple Vitamins-Minerals (MULTIVITAMIN WITH MINERALS) tablet Take 1 tablet by mouth daily.          Home: Home Living Family/patient expects to be discharged to:: Private residence Living Arrangements: Spouse/significant other,Children Available Help at Discharge: Family Type of Home: House Home Access: Stairs to enter Technical brewer of Steps: 1 Entrance Stairs-Rails: None Home Layout: One level Bathroom Shower/Tub: Financial planner: Yes Home Equipment: None  Lives With: Spouse  Functional History: Functional Status:  Mobility: Bed Mobility Overal bed mobility: Needs Assistance Bed Mobility: Supine to Sit,Sit to Supine Supine to sit: Min guard Sit to supine: Min guard General bed mobility comments: Minguard for safety; Did not attend to lines and leads while moving out of and back into bed; Sat up to long sit in the bed initially; multimodal cues to bring LEs around to EOB; Pt got back to bed at end of session, facing bed, with hands on the bed, then bringing L knee up onto the bed and moving through quadruped before making her way to supine Transfers Overall transfer level: Needs assistance Equipment used: 1 person hand held assist  Transfers: Sit to/from Stand Sit to Stand: Min assist General transfer comment: Overall adequate LE strength to rise; needing handheld assist to steady; multimodal cueing to slow down to allow for line management Ambulation/Gait Ambulation/Gait assistance: Min guard,Mod assist,+2 safety/equipment Gait Distance (Feet): 60 Feet (hallway amb) Assistive device: 1 person hand held assist,2 person hand held  assist Gait Pattern/deviations: Decreased step length - right,Decreased step length - left,Drifts right/left General Gait Details: Handheld assist to steady; notable gait assymetries including no arm swing RUE, decr foot clearance RLE; 3 significant losses of balance to the right, needing mod assist to prevent fall Gait velocity: decr   ADL:   Cognition: Cognition Overall Cognitive Status: Impaired/Different from baseline Arousal/Alertness: Awake/alert Orientation Level: Oriented to person,Oriented to place,Oriented to situation Attention: Sustained Sustained Attention: Appears intact Memory:  (unable to assess secondary to aphasia) Awareness: Impaired Awareness Impairment: Intellectual impairment Problem Solving: Impaired Problem Solving Impairment: Functional basic Executive Function: Sequencing Sequencing: Impaired Sequencing Impairment: Functional basic Cognition Arousal/Alertness: Awake/alert Behavior During Therapy: WFL for tasks assessed/performed,Impulsive Overall Cognitive Status: Impaired/Different from baseline Area of Impairment: Problem solving,Awareness,Safety/judgement General Comments: Pt's husband reports she has been getting up OOB impulsively many times throughout the day   Blood pressure (!) 143/55, pulse 62, temperature 97.6 F (36.4 C), temperature source Oral, resp. rate 18, height 5\' 2"  (1.575 m), weight 69.9 kg, SpO2 99 %. Physical Exam Gen: no distress, normal appearing HEENT: oral mucosa pink and moist, NCAT Cardio: Reg rate Chest: normal effort, normal rate of breathing Abd: soft, non-distended Ext: no edema Psych: pleasant, lethargic Skin: intact Neurological:     Comments: Patient is awake and alert.  She did not follow verbal commands.  There was a language barrier.  Speech output was minimal. 4/5 strength RUE, 4+5 LUE, strength intact in LE    Lab Results Last 24 Hours       Results for orders placed or performed during the hospital  encounter of 10/23/20 (from the past 24 hour(s))  Protime-INR     Status: None    Collection Time: 10/25/20  2:24 AM  Result Value Ref Range    Prothrombin Time 13.5 11.4 - 15.2 seconds    INR 1.1 0.8 - 1.2  CBC     Status: Abnormal    Collection Time: 10/25/20  2:24 AM  Result Value Ref Range    WBC 7.6 4.0 - 10.5 K/uL    RBC 3.97 3.87 - 5.11 MIL/uL    Hemoglobin 11.9 (L) 12.0 - 15.0 g/dL    HCT 33.4 (L) 36.0 - 46.0 %    MCV 84.1 80.0 - 100.0 fL    MCH 30.0 26.0 - 34.0 pg    MCHC 35.6 30.0 - 36.0 g/dL    RDW 12.5 11.5 - 15.5 %    Platelets 202 150 - 400 K/uL    nRBC 0.0 0.0 - 0.2 %  Basic metabolic panel     Status: Abnormal    Collection Time: 10/25/20  2:24 AM  Result Value Ref Range    Sodium 140 135 - 145 mmol/L    Potassium 3.5 3.5 - 5.1 mmol/L    Chloride 110 98 - 111 mmol/L    CO2 23 22 - 32 mmol/L    Glucose, Bld 99 70 - 99 mg/dL    BUN 10 8 - 23 mg/dL    Creatinine, Ser 0.65 0.44 - 1.00 mg/dL    Calcium 8.8 (L) 8.9 - 10.3 mg/dL    GFR, Estimated >60 >  60 mL/min    Anion gap 7 5 - 15  Magnesium     Status: None    Collection Time: 10/25/20  2:24 AM  Result Value Ref Range    Magnesium 1.8 1.7 - 2.4 mg/dL  Phosphorus     Status: None    Collection Time: 10/25/20  2:24 AM  Result Value Ref Range    Phosphorus 3.1 2.5 - 4.6 mg/dL       Imaging Results (Last 48 hours)  CT Angio Head W/Cm &/Or Wo Cm   Result Date: 10/23/2020 CLINICAL DATA:  Altered mental status. EXAM: CT ANGIOGRAPHY HEAD AND NECK TECHNIQUE: Multidetector CT imaging of the head and neck was performed using the standard protocol during bolus administration of intravenous contrast. Multiplanar CT image reconstructions and MIPs were obtained to evaluate the vascular anatomy. Carotid stenosis measurements (when applicable) are obtained utilizing NASCET criteria, using the distal internal carotid diameter as the denominator. CONTRAST:  16mL OMNIPAQUE IOHEXOL 350 MG/ML SOLN COMPARISON:  None. FINDINGS: CT  HEAD FINDINGS Brain: There is a 2.2 x 2.2 x 2.4 cm partially calcified extra-axial mass anteriorly along the falx in the left frontal region with evidence of very mild edema in the left frontal lobe. This mass is associated with extensive dural ossification/hyperostosis anteriorly and laterally over the left frontal convexity with mass effect on the underlying left frontal lobe. Separate foci of dural calcification/ossification are noted more superiorly along the falx. There is also an 8 mm focus of fat along the inner aspect of the anterior left frontal hyperostosis. There is trace rightward midline shift at the level of the mass. There is mild mass effect on the left lateral ventricle. There is no evidence of hydrocephalus, an acute infarct, intracranial hemorrhage, or extra-axial fluid collection. A small right cerebellar infarct is favored to be chronic. Vascular: Calcified atherosclerosis at the skull base. Skull: Extensive left frontal hyperostosis. Sinuses: Partially visualized mucosal thickening and likely fluid in the maxillary sinuses. Clear mastoid air cells. Orbits: Unremarkable. 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 Wide patency of both subclavian arteries with mild nonstenotic plaque in the proximal left subclavian artery. Right carotid system: Patent with mild atheromatous wall thickening in the mid and distal common carotid artery. No evidence of a dissection or significant stenosis. Left carotid system: Patent with mild atheromatous wall thickening in the mid and distal common carotid artery and mild calcified plaque at the carotid bifurcation. No evidence of dissection or significant stenosis. Vertebral arteries: Patent without evidence of stenosis or dissection. Strongly dominant left vertebral artery. Skeleton: Mild disc and  moderate facet degeneration in the cervical spine. Other neck: Bilateral thyroid nodules including a 2.3 cm nodule on the right. No evidence of cervical lymphadenopathy Upper chest: Left greater than right apical lung scarring. Review of the MIP images confirms the above findings CTA HEAD FINDINGS Anterior circulation: The internal carotid arteries are patent from skull base to carotid termini with mild atherosclerotic plaque bilaterally not resulting in a significant stenosis. ACAs and MCAs are patent without evidence of a proximal branch occlusion. There are moderate proximal right M1 and severe distal left M1 stenoses. Mild branch vessel irregularity is present bilaterally. No aneurysm is identified. Posterior circulation: The intracranial vertebral arteries are widely patent to the basilar with the right being diminutive distal to the PICA origin. Patent PICA, AICA,  and SCA origins are identified bilaterally. The basilar artery is widely patent. Both PCAs are patent with moderate left greater than right P2 segment stenoses. No aneurysm is identified. Venous sinuses: Narrowed but patent superior sagittal sinus by dural ossification. Anatomic variants: Hypoplastic right vertebral artery. Review of the MIP images confirms the above findings IMPRESSION: 1. 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 Mild edema in the left frontal lobe. Trace local rightward midline shift. Brain MRI without and with contrast is recommended for further evaluation. 2. No evidence of an acute infarct or intracranial hemorrhage. 3. No large vessel occlusion. 4. Intracranial atherosclerosis including moderate right and severe left M1 stenoses and moderate bilateral P2 stenoses. 5. Widely patent cervical carotid and vertebral arteries. 6. 2.3 cm right thyroid nodule. Recommend thyroid US (ref: J Am Coll Radiol. 2015 Feb;12(2): 143-50). Electronically Signed   By: Logan Bores M.D.   On: 10/23/2020 14:06    CT HEAD WO CONTRAST   Result Date: 10/24/2020 CLINICAL DATA:  70 year old female with worsening NIH stroke scale. Punctate left MCA territory infarcts on MRI yesterday. EXAM: CT HEAD WITHOUT CONTRAST TECHNIQUE: Contiguous axial images were obtained from the base of the skull through the vertex without intravenous contrast. COMPARISON:  Brain MRI, CTA head and neck yesterday. FINDINGS: Brain: Suspicious appearance now of the left occipital pole suggesting new cytotoxic edema there (series 4, image 12). Streak artifact from the complex skull lesion is felt less likely. Cytotoxic edema is identified in the posterior insula and appears larger than the DWI lesion there yesterday. But other gray-white matter differentiation in the left MCA territory appears stable. No acute intracranial hemorrhage identified. No ventriculomegaly. Chronic small cerebellar infarcts appear stable. Complex left frontal convexity meningioma(s) and associated mass effect appear stable. Possible mild anterior left frontal lobe vasogenic edema is stable. Vascular: Calcified atherosclerosis at the skull base. No suspicious intracranial vascular hyperdensity. 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 No acute osseous abnormality identified. Sinuses/Orbits: Stable. Small maxillary sinus fluid levels and mucoperiosteal thickening. Other: Stable orbit and scalp soft tissues. IMPRESSION: 1. Appearance suspicious for new Left PCA territory ischemia in the occipital pole. However, this might be streak artifact related to the complex left skull lesion. 2. Cytotoxic edema now evident at the posterior left insula and mildly larger than the DWI lesion yesterday. 3. Stable brain otherwise. No acute hemorrhage or increased intracranial mass effect. 4. Stable complex left frontal convexity meningioma(s) and skull hyperostosis. Electronically Signed   By: Genevie Ann M.D.   On:  10/24/2020 06:13    CT Angio Neck W and/or Wo Contrast   Result Date: 10/23/2020 CLINICAL DATA:  Altered mental status. EXAM: CT ANGIOGRAPHY HEAD AND NECK TECHNIQUE: Multidetector CT imaging of the head and neck was performed using the standard protocol during bolus administration of intravenous contrast. Multiplanar CT image reconstructions and MIPs were obtained to evaluate the vascular anatomy. Carotid stenosis measurements (when applicable) are obtained utilizing NASCET criteria, using the distal internal carotid diameter as the denominator. CONTRAST:  56mL OMNIPAQUE IOHEXOL 350 MG/ML SOLN COMPARISON:  None. FINDINGS: CT HEAD FINDINGS Brain: There is a 2.2 x 2.2 x 2.4 cm partially calcified extra-axial mass anteriorly along the falx in the left frontal region with evidence of very mild edema in the left frontal lobe. This mass is associated with extensive dural ossification/hyperostosis anteriorly and laterally over the left frontal convexity with mass effect on the underlying left  frontal lobe. Separate foci of dural calcification/ossification are noted more superiorly along the falx. There is also an 8 mm focus of fat along the inner aspect of the anterior left frontal hyperostosis. There is trace rightward midline shift at the level of the mass. There is mild mass effect on the left lateral ventricle. There is no evidence of hydrocephalus, an acute infarct, intracranial hemorrhage, or extra-axial fluid collection. A small right cerebellar infarct is favored to be chronic. Vascular: Calcified atherosclerosis at the skull base. Skull: Extensive left frontal hyperostosis. Sinuses: Partially visualized mucosal thickening and likely fluid in the maxillary sinuses. Clear mastoid air cells. Orbits: Unremarkable. Review of the MIP images confirms the above findings CTA NECK FINDINGS Aortic arch: Normal variant aortic arch branching pattern with an aberrant right subclavian artery which courses posterior to the  esophagus. Wide patency of both subclavian arteries with mild nonstenotic plaque in the proximal left subclavian artery. Right carotid system: Patent with mild atheromatous wall thickening in the mid and distal common carotid artery. No evidence of a dissection or significant stenosis. Left carotid system: Patent with mild atheromatous wall thickening in the mid and distal common carotid artery and mild calcified plaque at the carotid bifurcation. No evidence of dissection or significant stenosis. Vertebral arteries: Patent without evidence of stenosis or dissection. Strongly dominant left vertebral artery. Skeleton: Mild disc and moderate facet degeneration in the cervical spine. Other neck: Bilateral thyroid nodules including a 2.3 cm nodule on the right. No evidence of cervical lymphadenopathy Upper chest: Left greater than right apical lung scarring. Review of the MIP images confirms the above findings CTA HEAD FINDINGS Anterior circulation: The internal carotid arteries are patent from skull base to carotid termini with mild atherosclerotic plaque bilaterally not resulting in a significant stenosis. ACAs and MCAs are patent without evidence of a proximal branch occlusion. There are moderate proximal right M1 and severe distal left M1 stenoses. Mild branch vessel irregularity is present bilaterally. No aneurysm is identified. Posterior circulation: The intracranial vertebral arteries are widely patent to the basilar with the right being diminutive distal to the PICA origin. Patent PICA, AICA, and SCA origins are identified bilaterally. The basilar artery is widely patent. Both PCAs are patent with moderate left greater than right P2 segment stenoses. No aneurysm is identified. Venous sinuses: Narrowed but patent superior sagittal sinus by dural ossification. Anatomic variants: Hypoplastic right vertebral artery. Review of the MIP images confirms the above findings IMPRESSION: 1. Left frontal parafalcine mass  with extensive regional hyperostosis which may reflect an en plaque meningioma. Mild edema in the left frontal lobe. Trace local rightward midline shift. Brain MRI without and with contrast is recommended for further evaluation. 2. No evidence of an acute infarct or intracranial hemorrhage. 3. No large vessel occlusion. 4. Intracranial atherosclerosis including moderate right and severe left M1 stenoses and moderate bilateral P2 stenoses. 5. Widely patent cervical carotid and vertebral arteries. 6. 2.3 cm right thyroid nodule. Recommend thyroid US (ref: J Am Coll Radiol. 2015 Feb;12(2): 143-50). Electronically Signed   By: Logan Bores M.D.   On: 10/23/2020 14:06    MR BRAIN WO CONTRAST   Result Date: 10/24/2020 CLINICAL DATA:  Stroke. Neuro change, with right facial droop and aphasia. EXAM: MRI HEAD WITHOUT CONTRAST TECHNIQUE: Multiplanar, multiecho pulse sequences of the brain and surrounding structures were obtained without intravenous contrast. COMPARISON:  MRI head 10/23/2020.  CT head 10/24/2020 FINDINGS: Brain: Restricted diffusion compatible with acute infarct on the left has progressed. This involves  the posterior insula as well as the deep white matter in the left posterior frontal lobe. Small areas of cortical infarct also new. No infarct in the left occipital lobe as questioned on CT. Small chronic infarct in the right cerebellum. No intracranial hemorrhage. Left hemispheric extra-axial mass is partially ossified and also has areas of fatty transformation. This is predominantly in the left frontal lobe but extends to the posterior parietal lobe. There is extensive hyperostosis of the left frontal bone. Mild mass-effect on the left frontal lobe. No midline shift. Ventricle size normal. Vascular: Normal arterial flow voids. Skull and upper cervical spine: Extensive hyperostosis left frontal bone consistent with meningioma. Sinuses/Orbits: Mucosal edema and air-fluid levels in the sphenoid sinus  bilaterally. Normal orbit. Other: None IMPRESSION: Acute infarct in the left MCA territory with significant progression since yesterday. This involves the insula and deep white matter with small areas of cortical infarct. 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 No associated brain edema. Electronically Signed   By: Franchot Gallo M.D.   On: 10/24/2020 09:35    MR Brain W and Wo Contrast   Result Date: 10/23/2020 CLINICAL DATA:  TIA. Altered mental status. Left frontal parafalcine mass on CT. EXAM: MRI HEAD WITHOUT AND WITH CONTRAST TECHNIQUE: Multiplanar, multiecho pulse sequences of the brain and surrounding structures were obtained without and with intravenous contrast. CONTRAST:  34mL GADAVIST GADOBUTROL 1 MMOL/ML IV SOLN COMPARISON:  Head and neck CTA 10/23/2020 FINDINGS: Brain: There are subcentimeter acute infarcts in the posterior left insula and left parietal subcortical white matter (MCA territory). There is are small chronic bilateral cerebellar infarcts. There is no intracranial hemorrhage, hydrocephalus, or extra-axial fluid collection. Scattered small foci of T2 hyperintensity in the cerebral white matter bilaterally are nonspecific but compatible with mild chronic small vessel ischemic disease. An avidly enhancing extra-axial mass along the left anterior falx in the frontal region measures 2.6 x 2.2 x 2.5 cm (AP x transverse x craniocaudal) with mild edema in the adjacent left frontal white matter and minimal localized rightward midline shift. 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 This extends inferiorly and laterally into the anterior and middle cranial fossae, and there is a 1.1 cm enhancing component which  projects into the left sylvian fissure. A separate enhancing calcified mass along the left aspect of the falx in the posterior frontal region measures 1.5 cm. There is mild diffuse dural thickening along the upper portion of the falx. Vascular: Major intracranial vascular flow voids are preserved. Skull and upper cervical spine: Extensive left frontal hyperostosis. Sinuses/Orbits: Unremarkable orbits. Paranasal sinuses and mastoid air cells are clear. Other: None. IMPRESSION: 1. 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 2. 2.6 cm enhancing left frontal parafalcine mass consistent with a meningioma with mild edema and minimal rightward midline shift. This is associated with extensive dural ossification and left frontal skull hyperostosis along the anterior and lateral cerebral convexities. 3. Smaller meningiomas along the falx in the posterior left frontal region and laterally in the left sylvian fissure. 4. 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 Electronically Signed   By: Logan Bores M.D.   On: 10/23/2020 16:08    EEG adult   Result Date: 10/23/2020 [name], MD  10/23/2020  7:38 PM Patient Name: Taurus Willis MRN: [id number] Epilepsy Attending: [name] Referring Physician/Provider: DR Lesleigh Noe Date: 10/23/2020 Duration: 30.35 mins Patient history: 70 y.o. woman w/ 1h of aphasia and R sided weakness. EEG to evaluate for seizure Level of alertness: Awake, asleep AEDs during EEG study: Technical aspects: This EEG study was done with scalp electrodes positioned according to the 10-20 International system of electrode placement. Electrical activity was acquired at a sampling rate of 500Hz  and reviewed with a high frequency filter of 70Hz  and a low frequency filter of 1Hz . EEG data were recorded continuously and digitally stored. Description: The posterior dominant rhythm consists of 9-10 Hz activity of moderate  voltage (25-35 uV) seen predominantly in posterior head regions, symmetric and reactive to eye opening and eye closing. Sleep was characterized by vertex waves, sleep spindles (12 to 14 Hz), maximal frontocentral region. EEG showed intermitten tleft temporal 2-3hz  delta slowing.  Hyperventilation and photic stimulation were not performed.   ABNORMALITY -Intermittent slow, left temporal IMPRESSION: This study is suggestive of cortical dysfunction in left temporal region which is non specific but could be secondary to underlying structural abnormality. No seizures or epileptiform discharges were seen throughout the recording. [name]    ECHOCARDIOGRAM COMPLETE   Result Date: 10/24/2020    ECHOCARDIOGRAM REPORT   Patient Name:   DODY SMARTT Date of Exam: 10/24/2020 Medical Rec #:  [id number]     Height:       62.0 in Accession #:    [id number]    Weight:       154.0 lb Date of Birth:  03-30-1951    BSA:          72.711 m Patient Age:    36 years      BP:           171/60 mmHg Patient Gender: F             HR:           62 bpm. Exam Location:  Inpatient Procedure: 2D Echo, Cardiac Doppler, Color Doppler and Intracardiac            Opacification Agent Indications:    Stroke  History:        Patient has no prior history of Echocardiogram examinations.                 Risk Factors:Hypertension and Dyslipidemia.  Sonographer:    Clayton Lefort RDCS (AE) Referring Phys: [id number] [place]  1. Left ventricular ejection fraction, by estimation, is 60 to 65%. The left ventricle has normal function. The left ventricle has no regional wall motion abnormalities. Left ventricular diastolic parameters are indeterminate.  2. Right ventricular systolic function is normal. The right ventricular size is normal. There is normal pulmonary artery systolic pressure.  3. Right atrial size was mildly dilated.  4. The mitral valve is normal in structure. Trivial mitral valve regurgitation.  5. The aortic valve is  normal in structure. Aortic valve regurgitation is mild. No aortic stenosis is present. FINDINGS  Left Ventricle: Left ventricular ejection fraction, by estimation, is 60 to 65%. The left ventricle has normal function. The left ventricle has no regional wall motion abnormalities. Definity contrast agent was given IV to delineate the left ventricular  endocardial borders. The left ventricular internal cavity size was normal in size. There is borderline left ventricular hypertrophy. Left ventricular diastolic parameters are indeterminate. Right Ventricle: The right ventricular size is normal.  No increase in right ventricular wall thickness. Right ventricular systolic function is normal. There is normal pulmonary artery systolic pressure. The tricuspid regurgitant velocity is 2.29 m/s, and  with an assumed right atrial pressure of 3 mmHg, the estimated right ventricular systolic pressure is 45.8 mmHg. Left Atrium: Left atrial size was normal in size. Right Atrium: Right atrial size was mildly dilated. Pericardium: There is no evidence of pericardial effusion. Mitral Valve: The mitral valve is normal in structure. Trivial mitral valve regurgitation. MV peak gradient, 2.9 mmHg. The mean mitral valve gradient is 1.0 mmHg. Tricuspid Valve: The tricuspid valve is grossly normal. Tricuspid valve regurgitation is trivial. Aortic Valve: The aortic valve is normal in structure. Aortic valve regurgitation is mild. No aortic stenosis is present. Aortic valve mean gradient measures 3.0 mmHg. Aortic valve peak gradient measures 5.1 mmHg. Aortic valve area, by VTI measures 1.94 cm. Pulmonic Valve: The pulmonic valve was normal in structure. Pulmonic valve regurgitation is not visualized. Aorta: The aortic root and ascending aorta are structurally normal, with no evidence of dilitation. IAS/Shunts: The atrial septum is grossly normal.  LEFT VENTRICLE PLAX 2D LVIDd:         4.10 cm  Diastology LVIDs:         2.90 cm  LV e' medial:     5.87 cm/s LV PW:         1.30 cm  LV E/e' medial:  11.0 LV IVS:        1.10 cm  LV e' lateral:   6.85 cm/s LVOT diam:     1.70 cm  LV E/e' lateral: 9.4 LV SV:         55 LV SV Index:   32 LVOT Area:     2.27 cm  RIGHT VENTRICLE             IVC RV Basal diam:  2.80 cm     IVC diam: 1.00 cm RV S prime:     10.60 cm/s TAPSE (M-mode): 2.7 cm LEFT ATRIUM             Index       RIGHT ATRIUM           Index LA diam:        2.80 cm 1.64 cm/m  RA Area:     19.20 cm LA Vol (A2C):   25.7 ml 15.02 ml/m RA Volume:   56.90 ml  33.26 ml/m LA Vol (A4C):   43.4 ml 25.37 ml/m LA Biplane Vol: 33.8 ml 19.76 ml/m  AORTIC VALVE AV Area (Vmax):    2.09 cm AV Area (Vmean):   1.88 cm AV Area (VTI):     1.94 cm AV Vmax:           113.00 cm/s AV Vmean:          81.600 cm/s AV VTI:            0.282 m AV Peak Grad:      5.1 mmHg AV Mean Grad:      3.0 mmHg LVOT Vmax:         104.00 cm/s LVOT Vmean:        67.600 cm/s LVOT VTI:          0.241 m LVOT/AV VTI ratio: 0.85  AORTA Ao Root diam: 3.30 cm Ao Asc diam:  3.00 cm MITRAL VALVE               TRICUSPID VALVE MV Area (PHT): 2.87  cm    TR Peak grad:   21.0 mmHg MV Area VTI:   1.63 cm    TR Vmax:        229.00 cm/s MV Peak grad:  2.9 mmHg MV Mean grad:  1.0 mmHg    SHUNTS MV Vmax:       0.85 m/s    Systemic VTI:  0.24 m MV Vmean:      41.9 cm/s   Systemic Diam: 1.70 cm MV Decel Time: 264 msec MV E velocity: 64.70 cm/s MV A velocity: 82.30 cm/s MV E/A ratio:  0.79 Mertie Moores MD Electronically signed by Mertie Moores MD Signature Date/Time: 10/24/2020/3:36:07 PM    Final          Assessment/Plan: Diagnosis: Left MCA infarct 1. Does the need for close, 24 hr/day medical supervision in concert with the patient's rehab needs make it unreasonable for this patient to be served in a less intensive setting? Yes 2. Co-Morbidities requiring supervision/potential complications:  1. Meningioma 2. R>left upper extremity weakness: will benefit from intensive PT and  OT 3. Lethargy 4. Cerebral edema 5. Expressive aphasia: will benefit from intensive SLP 6. Hypertensive urgency: will require TID monitoring of BP 3. Due to bladder management, bowel management, safety, skin/wound care, disease management, medication administration, pain management and patient education, does the patient require 24 hr/day rehab nursing? Yes 4. Does the patient require coordinated care of a physician, rehab nurse, therapy disciplines of PT, OT, SLP to address physical and functional deficits in the context of the above medical diagnosis(es)? Yes Addressing deficits in the following areas: balance, endurance, locomotion, strength, transferring, bowel/bladder control, bathing, dressing, feeding, grooming, toileting, cognition, language and psychosocial support 5. Can the patient actively participate in an intensive therapy program of at least 3 hrs of therapy per day at least 5 days per week? Yes 6. The potential for patient to make measurable gains while on inpatient rehab is excellent 7. Anticipated functional outcomes upon discharge from inpatient rehab are supervision  with PT, supervision with OT, supervision with SLP. 8. Estimated rehab length of stay to reach the above functional goals is: 7-9 days 9. Anticipated discharge destination: Home 10. Overall Rehab/Functional Prognosis: excellent   RECOMMENDATIONS: This patient's condition is appropriate for continued rehabilitative care in the following setting: CIR Patient has agreed to participate in recommended program. Yes Note that insurance prior authorization may be required for reimbursement for recommended care.   Comment: Thank you for this consult. Admission coordinator to follow.    I have personally performed a face to face diagnostic evaluation, including, but not limited to relevant history and physical exam findings, of this patient and developed relevant assessment and plan.  Additionally, I have reviewed and  concur with the physician assistant's documentation above.   Leeroy Cha, MD   Lavon Paganini McClure, PA-C 10/25/2020

## 2020-11-02 NOTE — Progress Notes (Signed)
PROGRESS NOTE   Subjective/Complaints: As per Brittany Griffith she is not following commands well. WBC low.  3/23 labs stable.  Updated daughter via phone.   Objective:   No results found. Recent Labs    11/01/20 0404 11/02/20 0507  WBC 11.1* 10.6*  HGB 12.9 12.4  HCT 39.2 38.0  PLT 220 223   Recent Labs    11/01/20 0404 11/02/20 0507  NA 150* 143  K 3.2* 3.5  CL 115* 111  CO2 25 24  GLUCOSE 126* 119*  BUN 25* 23  CREATININE 0.76 0.71  CALCIUM 9.5 8.9    Intake/Output Summary (Last 24 hours) at 11/02/2020 0940 Last data filed at 11/02/2020 0700 Gross per 24 hour  Intake 957 ml  Output 1 ml  Net 956 ml        Physical Exam: Vital Signs Blood pressure (!) 157/76, pulse 81, temperature 98 F (36.7 C), temperature source Oral, resp. rate 18, height 5\' 2"  (1.575 m), weight 61.1 kg, SpO2 97 %. Gen: no distress, normal appearing HEENT: oral mucosa pink and moist, NCAT Cardio: Reg rate Chest: normal effort, normal rate of breathing Abd: soft, non-distended Ext: no edema Psych: pleasant, normal affect Skin: intact Genitourinary:    Comments: purewick in place Musculoskeletal:     Cervical back: Normal range of motion and neck supple.     Comments: LUE in biceps, grip and finger abd 5-/5- also likely triceps, however pt kept perseverating during exam on previous command RUE- 0/5 except trace in deltoid LLE- HF, DF and PF 5-/5-  RLE- HF 2/5, KE 2+/5, DF 0/5 and Pt couldn't do PF due to apraxia?  Skin:    General: Skin is warm and dry.     Comments: No skin breakdown seen- could not turn to assess backside- no breakdown on heels B/L  Neurological:     Comments: Patient is alert no acute distress.  Makes eye contact with examiner.  She is aphasic with language barrier.  Follows simple demonstrated commands.  Pt is globally aphasic, however appears to understand some things, just not everything- nonverbal- did shake  head yes/no 1x- and showed was very apraxic/perseverative during exam Attempted to assess sensation- not able to tell with aphasia.   Psychiatric:     Comments: Flat- nonverbal- hard to assess     Assessment/Plan: 1. Functional deficits which require 3+ hours per day of interdisciplinary therapy in a comprehensive inpatient rehab setting.  Physiatrist is providing close team supervision and 24 hour management of active medical problems listed below.  Physiatrist and rehab team continue to assess barriers to discharge/monitor patient progress toward functional and medical goals  Care Tool:  Bathing              Bathing assist       Upper Body Dressing/Undressing Upper body dressing        Upper body assist      Lower Body Dressing/Undressing Lower body dressing            Lower body assist       Toileting Toileting    Toileting assist Assist for toileting: Moderate Assistance - Patient 50 - 74%  Transfers Chair/bed transfer  Transfers assist           Locomotion Ambulation   Ambulation assist              Walk 10 feet activity   Assist           Walk 50 feet activity   Assist           Walk 150 feet activity   Assist           Walk 10 feet on uneven surface  activity   Assist           Wheelchair     Assist               Wheelchair 50 feet with 2 turns activity    Assist            Wheelchair 150 feet activity     Assist          Blood pressure (!) 157/76, pulse 81, temperature 98 F (36.7 C), temperature source Oral, resp. rate 18, height 5\' 2"  (1.575 m), weight 61.1 kg, SpO2 97 %.    Medical Problem List and Plan: 1.  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  Impaired mobility:  -DVT/anticoagulation: Continue Lovenox, does have bruising             -antiplatelet therapy: Aspirin 81 mg daily and Brilinta 90 mg twice daily 3. Pain Management: Tylenol as needed 4. Mood: Provide emotional support             -antipsychotic agents: N/A 5. Neuropsych: This patient is not capable of making decisions on her own behalf. 6. Skin/Wound Care: Routine skin checks 7. Fluids/Electrolytes/Nutrition: Routine in and outs with follow-up chemistries 8.  Dysphagia.  Dysphagia #1 honey thick liquids.  Nasogastric tube for nutritional support.  Dietary follow-up.  Speech therapy follow-up- did eat 40-50% of lunch today, FYI 9.  Hypertension.  Norvasc 10 mg daily, Cozaar 50 mg twice daily.  Monitor with increased mobility  3/23: BP continues to be elevated.  10.  Seizure prophylaxis.  Keppra 500 mg twice daily.  EEG negative 11.  Hyperlipidemia.  Lipitor 12.  Incidental findings meningioma.  Follow-up outpatient neurosurgery Dr. Venetia Constable 13.  Hyperglycemia related to tube feeds.  SSI 14. Poor initiation/Aphasia- suggested Amantadine- family wanted to talk before deciding yes or no  3/23: discussed starting amantadine vs Ritalin with daughter and she is going to research both options and get back to me.  15. Disposition: I think pt's husband should be able to spend the night, esp for language barrier and reducing nursing burden-however said would allow primary rehab physician to make call past first night.   LOS: 1 days A FACE TO FACE EVALUATION WAS PERFORMED  Clide Deutscher Raulkar 11/02/2020, 9:40 AM

## 2020-11-02 NOTE — Evaluation (Signed)
Occupational Therapy Assessment and Plan  Patient Details  Name: Brittany Griffith MRN: [id number] Date of Birth: Mar 04, 1951  OT Diagnosis: abnormal posture, cognitive deficits, hemiplegia affecting non-dominant side and muscle weakness (generalized) Rehab Potential: Rehab Potential (ACUTE ONLY): Excellent ELOS: 25-28 days   Today's Date: 11/02/2020 OT Individual Time: 5790-3833 OT Individual Time Calculation (min): 69 min     Hospital Problem: Principal Problem:   Global aphasia Active Problems:   left MCA stroke   Meningioma (HCC)   Cerebral edema (HCC)   Hypertensive urgency   Left middle cerebral artery stroke ([place])   Dysphagia   Past Medical History:  Past Medical History:  Diagnosis Date  . Hypercholesteremia   . Hypertension   . Osteopenia    Past Surgical History:  Past Surgical History:  Procedure Laterality Date  . IR ANGIO INTRA EXTRACRAN SEL COM CAROTID INNOMINATE UNI R MOD SED  10/26/2020  . IR INTRA CRAN STENT  10/26/2020  . RADIOLOGY WITH ANESTHESIA Left 10/26/2020   Procedure: RADIOLOGY WITH ANESTHESIA LEFT MCA STENT;  Surgeon: Luanne Bras, MD;  Location: [place];  Service: Radiology;  Laterality: Left;    Assessment & Plan Clinical Impression: Patient is a 70 y.o. year old female with recent admission to the hospital on 10/23/2020 with right side weakness, headache and slurred speech.  Noted blood pressure 200/77.  CT angiogram of head and neck showed left frontal parafalcine mass with extensive regional hyperostosis possibly reflecting en plaque meningioma.  Mild edema in left frontal lobe.  Trace local rightward midline shift.  No large vessel occlusion.  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  2.6 cm enhancing left frontal parafalcine mass consistent with meningioma minimal rightward midline shift.  Neurosurgery Dr. Venetia Constable follow-up in regards to meningioma felt to be slow-growing and would follow-up  outpatient after initial evaluation by neurology services for CVA.  EEG negative for seizure and maintained on Keppra for seizure prophylaxis..  Echocardiogram with ejection fraction of 60 to 65% no wall motion abnormalities.  Patient underwent cerebral angiogram 10/26/2020 followed by stent assisted angioplasty of left MCA with  TTICI revascularization per interventional radiology.  Currently maintained on aspirin 81 mg daily and Brilinta 90 mg twice daily post stenting.  Patient was cleared to begin Lovenox for DVT prophylaxis.  She was maintained on 3% saline for her cerebral edema and monitoring of sodium levels.  On 10/30/2020 patient with questionable increasing weakness right upper extremity MRI/MRA completed showing considerable extension of the left middle cerebral artery territory infarction involving the vast majority of the cortical and subcortical distribution of the left middle cerebral artery with sparing of the basal ganglia and patient remained on aspirin as well as Brilinta..  Patient transferred to CIR on 11/01/2020 .    Patient currently requires total with basic self-care skills secondary to muscle weakness and muscle paralysis, impaired timing and sequencing, unbalanced muscle activation and decreased coordination, decreased attention to right, decreased awareness, decreased problem solving and delayed processing and decreased sitting balance, decreased standing balance, decreased postural control, hemiplegia and decreased balance strategies.  Prior to hospitalization, patient could complete ADLs with independent .  Patient will benefit from skilled intervention to decrease level of assist with basic self-care skills and increase independence with basic self-care skills prior to discharge home with care partner.  Anticipate patient will require minimal physical assistance and follow up outpatient.  OT - End of Session Activity Tolerance: Decreased this session Endurance Deficit: Yes OT  Assessment Rehab Potential (ACUTE ONLY): Excellent OT Patient demonstrates impairments in the following area(s): Balance;Motor;Cognition;Perception;Safety;Sensory OT Basic ADL's Functional Problem(s): Eating;Grooming;Bathing;Dressing;Toileting OT Transfers Functional Problem(s): Toilet;Tub/Shower OT Additional Impairment(s): Fuctional Use of Upper Extremity OT Plan OT Intensity: Minimum of 1-2 x/day, 45 to 90 minutes OT Frequency: 5 out of 7 days OT Duration/Estimated Length of Stay: 25-28 days OT Treatment/Interventions: Balance/vestibular training;Discharge planning;Functional electrical stimulation;Pain management;Self Care/advanced ADL retraining;Therapeutic Activities;UE/LE Coordination activities;Cognitive remediation/compensation;Disease mangement/prevention;Functional mobility training;Patient/family education;Therapeutic Exercise;Visual/perceptual remediation/compensation;DME/adaptive equipment instruction;Neuromuscular re-education;Psychosocial support;Splinting/orthotics;UE/LE Strength taining/ROM;Wheelchair propulsion/positioning OT Self Feeding Anticipated Outcome(s): supervision OT Basic Self-Care Anticipated Outcome(s): min assist OT Toileting Anticipated Outcome(s): min assist OT Bathroom Transfers Anticipated Outcome(s): min assist OT Recommendation Patient destination: Home Follow Up Recommendations: 24 hour supervision/assistance Equipment Recommended: To be determined   OT Evaluation Precautions/Restrictions  Precautions Precautions: Fall Precaution Comments: global aphasia, right hemiparesis Restrictions Weight Bearing Restrictions: No Pain Pain Assessment Pain Scale: 0-10 Pain Score: 0-No pain Home Living/Prior Functioning Home Living Living Arrangements: Spouse/significant other Available Help at Discharge: Family,Available 24 hours/day Type of Home: House Home Access: Stairs to enter [employer] of Steps: 1 Home Layout: One level Bathroom  Shower/Tub: Multimedia programmer: Programmer, systems: Yes  Lives With: Spouse IADL History Homemaking Responsibilities: Yes Current License: Yes Occupation: Part time employment Type of Occupation: Worked at [employer] part-time Prior Function Level of Independence: Independent with basic ADLs,Independent with homemaking with ambulation,Independent with homemaking with wheelchair Driving: Yes Vision Baseline Vision/History: Wears glasses Vision Assessment?: Vision impaired- to be further tested in functional context Perception  Perception: Impaired Inattention/Neglect: Does not attend to right side of body Praxis Praxis: Impaired Praxis Impairment Details: Motor planning Cognition Overall Cognitive Status: Difficult to assess Arousal/Alertness: Awake/alert Orientation Level:  (unable secondary to global aphasia) Year:  (unable secondary to global aphasia) Month:  (unable secondary to global aphasia) Day of Week:  (unable secondary to global aphasia) Memory:  (UTA due to speech/language impairments) Immediate Memory Recall:  (unable secondary to global aphasia) Memory Recall Sock:  (unable secondary to global aphasia) Memory Recall Blue:  (unable secondary to global aphasia) Attention: Sustained Sustained Attention: Appears intact Awareness: Impaired Awareness Impairment: Emergent impairment Problem Solving: Impaired Problem Solving Impairment: Functional basic Executive Function: Sequencing Sequencing: Impaired Sequencing Impairment: Functional basic Safety/Judgment: Impaired Comments: Pt with inconsistency noted when following one step instructional commands related to selfcare tasks.  Globally aphasic and inconsistent with yes/no head nods. Sensation Sensation Light Touch: Impaired by gross assessment Hot/Cold: Not tested Proprioception: Impaired by gross assessment Stereognosis: Not tested Additional Comments: Decreased ability to  accurately assess sensation in the RUE secondary to aphasia, but when therapist squeezed pt's finger hard there was a slight squint in her eyes. Coordination Gross Motor Movements are Fluid and Coordinated: No Fine Motor Movements are Fluid and Coordinated: No Coordination and Movement Description: Brunnstrum stage I in the RUE.  No functional movement noted. Motor  Motor Motor: Hemiplegia;Abnormal postural alignment and control Motor - Skilled Clinical Observations: Moderate right UE and LE hemiparesis  Trunk/Postural Assessment  Cervical Assessment Cervical Assessment: Exceptions to [facility] (slight rotation to the left with protraction) Thoracic Assessment Thoracic Assessment: Exceptions to [facility] (thoracic rounding in sitting) Lumbar Assessment Lumbar Assessment: Exceptions to [facility] (posterior pelvic tilt) Postural Control Postural Control: Deficits on evaluation Righting Reactions: delayed with LOB to the right and forward  Balance Balance Balance Assessed: Yes Static Sitting Balance Static Sitting - Balance Support: Feet supported Static Sitting - Level of Assistance: 3: Mod assist Dynamic Sitting Balance  Dynamic Sitting - Balance Support: Feet unsupported;During functional activity Dynamic Sitting - Level of Assistance: 2: Max assist Static Standing Balance Static Standing - Balance Support: During functional activity Static Standing - Level of Assistance: 2: Max assist Dynamic Standing Balance Dynamic Standing - Balance Support: During functional activity Dynamic Standing - Level of Assistance: 1: +2 Total assist Extremity/Trunk Assessment RUE Assessment RUE Assessment: Exceptions to [facility] Passive Range of Motion (PROM) Comments: WFLS for all joints Active Range of Motion (AROM) Comments: No AROM noted General Strength Comments: Pt with Brunnstrum stage I movement in the arm and hand.  No functional movement noted.  Slight finger flexor tone present. LUE Assessment LUE  Assessment: Within Functional Limits General Strength Comments: WFLs for selfcare tasks.  Care Tool Care Tool Self Care Eating       Oral Care    Oral Care Assist Level: Maximal assistance - Patient 25 - 49%    Bathing   Body parts bathed by patient: Chest;Right upper leg;Left upper leg;Left lower leg;Face Body parts bathed by helper: Right arm;Left arm;Abdomen;Front perineal area;Buttocks;Right lower leg   Assist Level: Total Assistance - Patient < 25%    Upper Body Dressing(including orthotics)   What is the patient wearing?: Pull over shirt   Assist Level: Total Assistance - Patient < 25%    Lower Body Dressing (excluding footwear)     Assist for lower body dressing: Total Assistance - Patient < 25% (supine for brief, and sit to stand for pants)    Putting on/Taking off footwear   What is the patient wearing?: Non-skid slipper socks;Ted hose Assist for footwear: Dependent - Patient 0%       Care Tool Toileting Toileting activity   Assist for toileting: Dependent - Patient 0%     Care Tool Bed Mobility Roll left and right activity   Roll left and right assist level: Total Assistance - Patient < 25% (roll to the left)    Sit to lying activity        Lying to sitting edge of bed activity   Lying to sitting edge of bed assist level: Total Assistance - Patient < 25%     Care Tool Transfers Sit to stand transfer   Sit to stand assist level: Total Assistance - Patient < 25%    Chair/bed transfer   Chair/bed transfer assist level: Total Assistance - Patient < 25% (stand pivot)     Toilet transfer   Assist Level: Total Assistance - Patient < 25%     Care Tool Cognition Expression of Ideas and Wants Expression of Ideas and Wants: Rarely/Never expressess or very difficult - rarely/never expresses self or speech is very difficult to understand   Understanding Verbal and Non-Verbal Content Understanding Verbal and Non-Verbal Content: Sometimes understands -  understands only basic conversations or simple, direct phrases. Frequently requires cues to understand   Memory/Recall Ability *first 3 days only Memory/Recall Ability *first 3 days only: That he or she is in a hospital/hospital unit (accomodations made for speech/language deficits)    Refer to Care Plan for Long Term Goals  SHORT TERM GOAL WEEK 1 OT Short Term Goal 1 (Week 1): Pt will complete UB bathing with mod demonstrational cueing and min assist from supported sitting. OT Short Term Goal 2 (Week 1): Pt will complete LB bathing sit to stand with max assist and mod demonstrational cueing for sequencing. OT Short Term Goal 3 (Week 1): Pt will complete toilet transfer with mod assist squat pivot to drop  arm commode. OT Short Term Goal 4 (Week 1): Pt's spouse will return demonstrate safe assist with PROM exercises with the RUE. OT Short Term Goal 5 (Week 1): Pt will maintain static sitting balance with supervision EOB or EOM in preparation for selfcare tasks.  Recommendations for other services: None    Skilled Therapeutic Intervention ADL ADL Eating: NPO Grooming: Maximal assistance Where Assessed-Grooming: Wheelchair Upper Body Bathing: Maximal assistance Where Assessed-Upper Body Bathing: Wheelchair Lower Body Bathing: Dependent Where Assessed-Lower Body Bathing: Sitting at sink;Standing at sink;Wheelchair Upper Body Dressing: Dependent Where Assessed-Upper Body Dressing: Wheelchair Lower Body Dressing: Dependent Where Assessed-Lower Body Dressing: Sitting at sink;Standing at sink;Wheelchair Toileting: Dependent Where Assessed-Toileting: Bed level Toilet Transfer: Dependent Toilet Transfer Method: Engineer, water: Drop arm bedside commode Tub/Shower Transfer: Not assessed Social research officer, government: Not assessed Mobility  Bed Mobility Bed Mobility: Rolling Right;Rolling Left Rolling Right: Maximal Assistance - Patient 25-49% Rolling Left: Total  Assistance - Patient < 25% Transfers Sit to Stand: Total Assistance - Patient < 25% Stand to Sit: Total Assistance - Patient < 25%  Session Note:  Pt in bed with spouse and interpreter present.  She demonstrated inability to voice any verbal response to therapist's questions and could not consistently respond with yes/no head movements.  Noted bowel incontinence in her brief so worked on cleaning her up and changing her.  Total assist was needed for rolling to the left with max assist to the right.  Pt's spouse able to assist hands on without asking during these tasks.  After donning new brief with total assist, had her transfer to the left EOB from supine.  Max assist to complete with initial sitting balance at max assist as well with hard left lean.  This improved to min assist after a minute or so with stand pivot transfer completed to the right to the wheelchair.  Max facilitation needed to keep the left knee from buckling.  Worked on bathing and dressing next at the sink.  Pt unable to follow verbal instructions to reach up and squeeze out the washcloth with the left non involved hand.  Therapist had to place her hand on the washcloth and give her hand over hand guidance.  She was however able to bring it up to her face briefly and wash when told "wash you face".  She needed max demonstrational cueing for thoroughness to complete task.  She then needed max demonstrational cueing to sequence all other bathing secondary to not being able to follow instructional cueing.  Total assist for all dressing overall with pt attempting to place the LUE in her pants leg when presented to her.  Total assist as well for standing to pull them over her hips.  Finished session with pt in the wheelchair and LUE positioned on half lap tray for support.  Call button in reach with spouse present and safety belt in place.    Discharge Criteria: Patient will be discharged from OT if patient refuses treatment 3 consecutive times  without medical reason, if treatment goals not met, if there is a change in medical status, if patient makes no progress towards goals or if patient is discharged from hospital.  The above assessment, treatment plan, treatment alternatives and goals were discussed and mutually agreed upon: by family  Rami Budhu OTR/L 11/02/2020, 4:57 PM

## 2020-11-02 NOTE — Progress Notes (Signed)
Inpatient Rehabilitation Care Coordinator Assessment and Plan Patient Details  Name: Brittany Griffith MRN: [id number] Date of Birth: May 30, 1951  Today's Date: 11/02/2020  Hospital Problems: Principal Problem:   Global aphasia Active Problems:   left MCA stroke   Meningioma ([place])   Cerebral edema (HCC)   Hypertensive urgency   Left middle cerebral artery stroke [facility])   Dysphagia  Past Medical History:  Past Medical History:  Diagnosis Date  . Hypercholesteremia   . Hypertension   . Osteopenia    Past Surgical History:  Past Surgical History:  Procedure Laterality Date  . IR ANGIO INTRA EXTRACRAN SEL COM CAROTID INNOMINATE UNI R MOD SED  10/26/2020  . IR INTRA CRAN STENT  10/26/2020  . RADIOLOGY WITH ANESTHESIA Left 10/26/2020   Procedure: RADIOLOGY WITH ANESTHESIA LEFT MCA STENT;  Surgeon: Luanne Bras, MD;  Location: [place];  Service: Radiology;  Laterality: Left;   Social History:  reports that she has never smoked. She has never used smokeless tobacco. No history on file for alcohol use and drug use.  Family / Support Systems Marital Status: Married Spouse/Significant Other: Marina Goodell Children: Elsie Amis Anticipated Caregiver: Than Wyline Copas (spouse), Javier Docker (Son) Ability/Limitations of Caregiver: Spouse previosuly working, plans to stay home with pt Caregiver Availability: 24/7  Social History Preferred language: Nepali Religion:  Read: Yes Write: Yes Employment Status: Employed Public relations account executive Issues: n/a Guardian/Conservator: n/a   Abuse/Neglect Abuse/Neglect Assessment Can Be Completed: Yes Physical Abuse: Denies Verbal Abuse: Denies Sexual Abuse: Denies Exploitation of patient/patient's resources: Denies Self-Neglect: Denies  Emotional Status Pt's affect, behavior and adjustment status: coping Recent Psychosocial Issues: n/a Psychiatric History: n/a Substance Abuse History: n/a  Patient / Family Perceptions, Expectations &  Goals Pt/Family understanding of illness & functional limitations: yes Premorbid pt/family roles/activities: Previosuly independent, driving and working part-time Anticipated changes in roles/activities/participation: Assistance/Supervision Pt/family expectations/goals: Supervision to [employer] a  [employer]: None Premorbid Home Care/DME Agencies: None Transportation available at discharge: Family able to transport Resource referrals recommended: Neuropsychology  Discharge Planning Living Arrangements: Spouse/significant other [place]: Spouse/significant other,Children Type of Residence: Private residence (1 level home, 1 step to enter) Administrator, sports: Multimedia programmer (specify) ([place]) Money Management: Patient,Spouse Does the patient have any problems obtaining your medications?: No Home Management: Independent Patient/Family Preliminary Plans: some assistance Care Coordinator Barriers to Discharge: Insurance for SNF coverage Care Coordinator Anticipated Follow Up Needs: HH/OP Expected length of stay: 9-12 Days  Clinical Impression SW met with patient. Pt having lunch with staff. Pt spouse not at bedside, sw will follow up. Spouse has been given permission to stay over night due to language barrier. Pt plans to d/c home with spouse and son to provide care. SW will continue to follow up.   Dyanne Iha 11/02/2020, 1:40 PM

## 2020-11-02 NOTE — Progress Notes (Signed)
Initial Nutrition Assessment  DOCUMENTATION CODES:   Not applicable  INTERVENTION:   - Continue 48-hour calorie count  - Continue Magic Cup BID with meals, each supplement provides 290 kcal and 9 grams of protein  Continue nocturnal tube feeds via Cortrak: - Osmolite 1.2 @ 60 ml/hr for 10 hours from 1800 to 0400 (total of 600 ml) - ProSource TF 45 ml BID - Free water flushes of 100 ml q 4 hours  Tube feeding regimen with free water flushes provides 800 kcal, 55 grams of protein, and 1092 ml of H2O (meets 47% of minimum kcal needs and 65% of minimum protein needs).  NUTRITION DIAGNOSIS:   Increased nutrient needs related to other (extensive therapies) as evidenced by estimated needs.  GOAL:   Patient will meet greater than or equal to 90% of their needs  MONITOR:   PO intake,Supplement acceptance,Weight trends,Labs,TF tolerance  REASON FOR ASSESSMENT:   Consult Calorie Count,Enteral/tube feeding initiation and management,Assessment of nutrition requirement/status  ASSESSMENT:   70 year old female with PMH of HLD and HTN. Presented 10/23/20 with right-sided weakness, headache, and slurred speech. 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 Pt underwent cerebral angiogram 10/26/20 followed by stent assisted angioplasty of left MCA with revascularization per IR. Pt currently on a dysphagia 1 diet with honey thick liquids and Cortrak in place for nutrition support. Admitted to CIR on 11/01/20.   Spoke with RN and NT who report pt's PO intake is fair to good. Calorie count in progress.  Attempted to speak with pt but no family available in room ant pt sleeping soundly. Pt did not awaken to RD voice. NT reports that pt interacts best when family is in room.  Reviewed RD notes from acute admission. Pt's daughter reported that pt had a good appetite PTA with no recent changes in her weight.  No weight history available prior to acute care admission.  Current TF: Osmolite 1.2 @ 60 ml/hr x 10  hours, ProSource TF 45 ml BID, free water flushes of 100 ml q 4 hours  Meal Completion: 50-75%  Medications reviewed and include: SSI q 4 hours, senna  Labs reviewed. CBG's: 117-158 x 24 hours  NUTRITION - FOCUSED PHYSICAL EXAM:  Unable to complete at this time.  Diet Order:   Diet Order            DIET - DYS 1 Room service appropriate? No; Fluid consistency: Honey Thick  Diet effective now                 EDUCATION NEEDS:   No education needs have been identified at this time  Skin:  Skin Assessment: Reviewed RN Assessment  Last BM:  11/02/20 medium type 6  Height:   Ht Readings from Last 1 Encounters:  11/02/20 5\' 2"  (1.575 m)    Weight:   Wt Readings from Last 1 Encounters:  11/02/20 61.1 kg    BMI:  Body mass index is 24.64 kg/m.  Estimated Nutritional Needs:   Kcal:  1700-1900  Protein:  85-100 grams  Fluid:  1.7-1.9 L    [name], MS, RD, LDN Inpatient Clinical Dietitian Please see [facility] for contact information.

## 2020-11-02 NOTE — Evaluation (Signed)
Physical Therapy Assessment and Plan  Patient Details  Name: Brittany Griffith MRN: [id number] Date of Birth: [birth year]  PT Diagnosis: Abnormal posture, Abnormality of gait, Cognitive deficits, Difficulty walking, Hemiplegia dominant, Hypotonia, Impaired cognition, Impaired sensation and Muscle weakness Rehab Potential: Good ELOS: 3.5-4 weeks   Today's Date: 11/02/2020 PT Individual Time: 4431-5400 PT Individual Time Calculation (min): 64 min    Hospital Problem: Principal Problem:   Global aphasia Active Problems:   left MCA stroke   Meningioma ([place])   Cerebral edema ([facility])   Hypertensive urgency   Left middle cerebral artery stroke ([place])   Dysphagia   Past Medical History:  Past Medical History:  Diagnosis Date  . Hypercholesteremia   . Hypertension   . Osteopenia    Past Surgical History:  Past Surgical History:  Procedure Laterality Date  . IR ANGIO INTRA EXTRACRAN SEL COM CAROTID INNOMINATE UNI R MOD SED  10/26/2020  . IR INTRA CRAN STENT  10/26/2020  . RADIOLOGY WITH ANESTHESIA Left 10/26/2020   Procedure: RADIOLOGY WITH ANESTHESIA LEFT MCA STENT;  Surgeon: Luanne Bras, MD;  Location: [place];  Service: Radiology;  Laterality: Left;    Assessment & Plan Clinical Impression: Patient is a 70 y.o.  right-handed limited English speaking female with history of hyperlipidemia as well as hypertension.  Per chart review lives with spouse and independent prior to admission.  1 level home one-step to entry.  Presented 10/23/2020 with right side weakness, headache and slurred speech.  Noted blood pressure 200/77.  CT angiogram of head and neck showed left frontal parafalcine mass with extensive regional hyperostosis possibly reflecting en plaque meningioma.  Mild edema in left frontal lobe.  Trace local rightward midline shift.  No large vessel occlusion.  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  2.6 cm enhancing left frontal  parafalcine mass consistent with meningioma minimal rightward midline shift.  Neurosurgery Dr. Venetia Constable follow-up in regards to meningioma felt to be slow-growing and would follow-up outpatient after initial evaluation by neurology services for CVA.  EEG negative for seizure and maintained on Keppra for seizure prophylaxis..  Echocardiogram with ejection fraction of 60 to 65% no wall motion abnormalities.  Patient underwent cerebral angiogram 10/26/2020 followed by stent assisted angioplasty of left MCA with  TTICI revascularization per interventional radiology.  Currently maintained on aspirin 81 mg daily and Brilinta 90 mg twice daily post stenting.  Patient was cleared to begin Lovenox for DVT prophylaxis.  She was maintained on 3% saline for her cerebral edema and monitoring of sodium levels.  On 10/30/2020 patient with questionable increasing weakness right upper extremity MRI/MRA completed showing considerable extension of the left middle cerebral artery territory infarction involving the vast majority of the cortical and subcortical distribution of the left middle cerebral artery with sparing of the basal ganglia and patient remained on aspirin as well as Brilinta..  Currently on a dysphagia #1 honey thick liquid diet with nasogastric tube feeds for nutritional support.  Therapy evaluations completed due to patient decreased functional ability, right side weakness and slurred speech was admitted for a comprehensive rehab program.  Patient transferred to CIR on 11/01/2020 .   Patient currently requires +2 max/total assist with mobility secondary to muscle weakness, muscle joint tightness and muscle paralysis, decreased cardiorespiratoy endurance, impaired timing and sequencing, abnormal tone, unbalanced muscle activation and decreased motor planning, decreased midline orientation and decreased attention to right, decreased initiation, decreased attention, decreased awareness, decreased problem solving,  decreased safety  awareness, decreased memory and delayed processing and decreased sitting balance, decreased standing balance, decreased postural control, hemiplegia and decreased balance strategies.  Prior to hospitalization, patient was independent  with mobility and lived with Spouse in a House home.  Home access is 1Stairs to enter.  Patient will benefit from skilled PT intervention to maximize safe functional mobility, minimize fall risk and decrease caregiver burden for planned discharge home with 24 hour assist.  Anticipate patient will benefit from follow up [facility] at discharge.  PT - End of Session Activity Tolerance: Tolerates 30+ min activity with multiple rests Endurance Deficit: Yes Endurance Deficit Description: requires seated rest breaks PT Assessment Rehab Potential (ACUTE/IP ONLY): Good PT Barriers to Discharge: Incontinence;Neurogenic Bowel & Bladder PT Patient demonstrates impairments in the following area(s): Balance;Perception;Behavior;Safety;Edema;Sensory;Endurance;Skin Integrity;Motor;Nutrition;Pain PT Transfers Functional Problem(s): Bed Mobility;Bed to Chair;Car;Furniture PT Locomotion Functional Problem(s): Ambulation;Wheelchair Mobility;Stairs PT Plan PT Intensity: Minimum of 1-2 x/day ,45 to 90 minutes PT Frequency: 5 out of 7 days PT Duration Estimated Length of Stay: 3.5-4 weeks PT Treatment/Interventions: Ambulation/gait training;Community reintegration;DME/adaptive equipment instruction;Neuromuscular re-education;Psychosocial support;Stair training;UE/LE Strength taining/ROM;Wheelchair propulsion/positioning;Discharge planning;Balance/vestibular training;Functional electrical stimulation;Pain management;Skin care/wound management;Therapeutic Activities;UE/LE Coordination activities;Cognitive remediation/compensation;Disease management/prevention;Functional mobility training;Patient/family education;Splinting/orthotics;Therapeutic Exercise;Visual/perceptual  remediation/compensation PT Transfers Anticipated Outcome(s): min assist PT Locomotion Anticipated Outcome(s): min assist limited distances using LRAD PT Recommendation Follow Up Recommendations: Home health PT;24 hour supervision/assistance Patient destination: Home Equipment Recommended: To be determined   PT Evaluation Precautions/Restrictions Precautions Precautions: Fall;Other (comment) Precaution Comments: global aphasia, dense R hemiparesis, R inattention Restrictions Weight Bearing Restrictions: No Pain Pain Assessment Pain Scale: Faces Faces Pain Scale: No hurt Home Living/Prior Functioning Home Living Available Help at Discharge: Family;Available 24 hours/day (husband) Type of Home: House Home Access: Stairs to enter [employer] of Steps: 1 Entrance Stairs-Rails: None Home Layout: One level  Lives With: Spouse Prior Function Level of Independence: Independent with gait;Independent with transfers Driving: Yes 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, driving, independent and very active in community - all subjective history obtained from chart review as pt nonverbal and no family present at time of evalution Perception  Perception Perception: Impaired Inattention/Neglect: Does not attend to right side of body;Does not attend to right visual field Praxis Praxis: Impaired Praxis Impairment Details: Motor planning  Cognition Overall Cognitive Status: Difficult to assess Arousal/Alertness: Awake/alert Orientation Level: Oriented to person (pt nonverbal throughout session only responding via yes/no head nods to simple questions - oriented to name and visually attended to this throughout session) Attention: Focused;Sustained Focused Attention: Appears intact Sustained Attention: Appears intact Awareness: Impaired Problem Solving: Impaired Safety/Judgment: Impaired Sensation Sensation Light Touch: Impaired by gross assessment (on R  hemibody - unable to formally  assess due to pt being nonverbal and aphasic with inability to comprehend instructions for assessment; however, demonstrates gross impairment during mobility tasks) Hot/Cold: Not tested Proprioception: Impaired by gross assessment (on R  hemibody - unable to formally assess due to pt being nonverbal and aphasic with inability to comprehend instructions for assessment; however, demonstrates gross impairment during mobility tasks) Stereognosis: Not tested Additional Comments: Decreased ability to accurately assess sensation in the RUE secondary to aphasia, but when therapist squeezed pt's finger hard there was a slight squint in her eyes. Coordination Gross Motor Movements are Fluid and Coordinated: No Fine Motor Movements are Fluid and Coordinated: No Coordination and Movement Description: dense R hemiplegia, R inattention, poor trunk control Heel Shin Test: unable on R LE due to paresis Motor  Motor Motor: Hemiplegia;Abnormal postural alignment and control;Abnormal tone Motor - Skilled  Clinical Observations: dense R hemiplegia with hypotonia and impaired head/trunk control   Trunk/Postural Assessment  Cervical Assessment Cervical Assessment: Exceptions to [facility] (L cervical rotation with L gaze preference) Thoracic Assessment Thoracic Assessment: Exceptions to [facility] (excessive thoracic kyphosis in sitting) Lumbar Assessment Lumbar Assessment: Exceptions to [facility] (posterior pelvic tilt in sitting) Postural Control Postural Control: Deficits on evaluation Head Control: noticed impaired head control during dependent w/c mobility with pt's head tilting posteriorly Trunk Control: impaired with R trunk lean in sitting Righting Reactions: delayed and insufficient with R LOB Postural Limitations: decreased  Balance Balance Balance Assessed: Yes Static Sitting Balance Static Sitting - Balance Support: Feet supported Static Sitting - Level of Assistance: 3: Mod assist;2: Max assist Dynamic Sitting  Balance Dynamic Sitting - Balance Support: Feet unsupported;During functional activity Dynamic Sitting - Level of Assistance: 2: Max assist;1: +1 Total assist Static Standing Balance Static Standing - Balance Support: During functional activity;Left upper extremity supported Static Standing - Level of Assistance: 1: +1 Total assist Dynamic Standing Balance Dynamic Standing - Balance Support: During functional activity;Left upper extremity supported Dynamic Standing - Level of Assistance: 1: +2 Total assist Extremity Assessment      RLE Assessment RLE Assessment: Exceptions to [facility] Passive Range of Motion (PROM) Comments: [facility] General Strength Comments: unable to participate in formal strength assessment due to aphasia with inability to follow complex commands - demonstrates only trace activation of hip extensors with no distal activation noted during functional mobility RLE Tone RLE Tone: Moderate;Hypotonic LLE Assessment LLE Assessment: Exceptions to [facility] Active Range of Motion (AROM) Comments: WFL General Strength Comments: unable to participate in formal strength assessment due to aphasia with inability to follow commands - demonstrates at least 4+5 throughout LE during functional mobility tasks  Care Tool Care Tool Bed Mobility Roll left and right activity   Roll left and right assist level: Total Assistance - Patient < 25%    Sit to lying activity   Sit to lying assist level: Maximal Assistance - Patient 25 - 49%    Lying to sitting edge of bed activity   Lying to sitting edge of bed assist level: Total Assistance - Patient < 25%     Care Tool Transfers Sit to stand transfer   Sit to stand assist level: 2 Helpers (+2 max/total assist)    Chair/bed transfer   Chair/bed transfer assist level: 2 Helpers (squat pivot +2 max assist)     Toilet transfer   Assist Level: Dependent - Patient 0% (stedy)    Car transfer Car transfer activity did not occur: Safety/medical  concerns        Care Tool Locomotion Ambulation Ambulation activity did not occur: Safety/medical concerns        Walk 10 feet activity Walk 10 feet activity did not occur: Safety/medical concerns       Walk 50 feet with 2 turns activity Walk 50 feet with 2 turns activity did not occur: Safety/medical concerns      Walk 150 feet activity Walk 150 feet activity did not occur: Safety/medical concerns      Walk 10 feet on uneven surfaces activity Walk 10 feet on uneven surfaces activity did not occur: Safety/medical concerns      Stairs Stair activity did not occur: Safety/medical concerns        Walk up/down 1 step activity Walk up/down 1 step or curb (drop down) activity did not occur: Safety/medical concerns     Walk up/down 4 steps activity did not occuR: Safety/medical  concerns  Walk up/down 4 steps activity      Walk up/down 12 steps activity Walk up/down 12 steps activity did not occur: Safety/medical concerns      Pick up small objects from floor Pick up small object from the floor (from standing position) activity did not occur: Safety/medical concerns      Wheelchair Will patient use wheelchair at discharge?:  (TBD)          Wheel 50 feet with 2 turns activity      Wheel 150 feet activity        Refer to Care Plan for Long Term Goals  SHORT TERM GOAL WEEK 1 PT Short Term Goal 1 (Week 1): Pt will consistently perform supine<>sit with max assist of 1 PT Short Term Goal 2 (Week 1): Pt will perform sit<>stands with max assist of 1 using LRAD or +2 HHA PT Short Term Goal 3 (Week 1): Pt will perform bed<>chair transfers with max assist of 1 PT Short Term Goal 4 (Week 1): Pt will participate in pre-gait training with +2 max assist using LRAD  Recommendations for other services: None   Skilled Therapeutic Intervention Evaluation completed (see details above) with patient education regarding purpose of PT evaluation, PT POC and goals, therapy schedule, weekly  team meetings, and other CIR information including safety plan and fall risk safety. Pt received in supported upright sitting with NT present providing full supervision for meal - pt continues to demonstrate pocketing with poor clearing of oral cavity requiring cuing to correct. Pt nonverbal throughout session but is able to nod head yes/no in response to simple questions. Pt appears agreeable to therapy session therefore therapist assumed care of patient. In-person interpreter, Melina Modena, present throughout session to communicate in [place]; however, pt noted to be able to follow simple 1-step cuing/commands in [place]. Pt demonstrates R inattention with L gaze preference throughout session. Pt completed mobility tasks as written below with cuing as described. Pt noted to have started having incontinent BM therefore encouraged to transition into bathroom to improve voiding. Supine>sitting R EOB, HOB max elevated as pt just finished meal to ensure no aspiration risk and using bedrail, with max assist for R hemibody management and trunk upright. Demonstrates impaired sitting balance requiring constant max/total assist for static sitting EOB. L squat pivot transfer EOB>w/c +2 max assist for lifting/pivoting hips - pt demos improve motor planning and initiation of transfers in this direction.  Transported to/from gym in w/c for time management and energy conservation. R squat/stand pivot w/c>BSC over toilet +2 max assist for lifting/pivoting hips and maintaining balance. Standing with total assist of 1 while +2 assist provided total assist LB clothing management - pt with incontinent BM in brief but able to continently void further on toilet. Sit>stand BSC over toilet>stedy with total assist of 1 for lifting, balance, and RUE management while +2 assist completed total assist LB clothing management and peri-care. Dependent stedy transfer to manual wheelchair. Pt noted to have impaired head control when moving in wheelchair  therefore retrieved TIS w/c with roho cushion for improved pressure relief to increase upright, OOB activity tolerance; however, unable to try TIS w/c during session today (may require different size). Sit>stand manual wheelchair>stedy requiring +2 total assist to come to standing from this lower seat height. Stedy transfer to EOB. Sit>stand from stedy seat +2 max assist. Sit>supine max assist for R hemibody management. Pt left supine in bed with needs in reach, HOB elevated >30degrees, bed alarm on, and secretary notified  of pt's inability to communicate needs via call bell system.   Mobility Bed Mobility Bed Mobility: Rolling Right;Rolling Left;Sit to Supine;Supine to Sit Rolling Right: Maximal Assistance - Patient 25-49% Rolling Left: Total Assistance - Patient < 25% Supine to Sit: Total Assistance - Patient < 25% Sit to Supine: Maximal Assistance - Patient 25-49% Transfers Transfers: Sit to Stand;Stand to Sit;Squat Pivot Transfers Sit to Stand: 2 Helpers;Maximal Assistance - Patient 25-49%;Total Assistance - Patient < 25% Stand to Sit: 2 Helpers;Maximal Assistance - Patient 25-49%;Total Assistance - Patient < 25% Stand Pivot Transfers: Total Assistance - Patient < 25% Squat Pivot Transfers: 2 Helpers;Total Assistance - Patient < 25%;Maximal Assistance - Patient 25-49% Transfer (Assistive device): None Locomotion  Gait Ambulation: No Gait Gait: No Stairs / Additional Locomotion Stairs: No Wheelchair Mobility Wheelchair Mobility: No   Discharge Criteria: Patient will be discharged from PT if patient refuses treatment 3 consecutive times without medical reason, if treatment goals not met, if there is a change in medical status, if patient makes no progress towards goals or if patient is discharged from hospital.  The above assessment, treatment plan, treatment alternatives and goals were discussed and mutually agreed upon: No family available/patient unable; however, appears agreeable  with full participation and compliance during session  Tawana Scale , PT, DPT, CSRS  11/02/2020, 12:41 PM

## 2020-11-03 ENCOUNTER — Other Ambulatory Visit: Payer: Self-pay | Admitting: Physical Medicine and Rehabilitation

## 2020-11-03 LAB — URINALYSIS, ROUTINE W REFLEX MICROSCOPIC
Bilirubin Urine: NEGATIVE
Glucose, UA: NEGATIVE mg/dL
Hgb urine dipstick: NEGATIVE
Ketones, ur: NEGATIVE mg/dL
Leukocytes,Ua: NEGATIVE
Nitrite: NEGATIVE
Protein, ur: NEGATIVE mg/dL
Specific Gravity, Urine: 1.02 (ref 1.005–1.030)
pH: 6 (ref 5.0–8.0)

## 2020-11-03 LAB — GLUCOSE, CAPILLARY
Glucose-Capillary: 101 mg/dL — ABNORMAL HIGH (ref 70–99)
Glucose-Capillary: 110 mg/dL — ABNORMAL HIGH (ref 70–99)
Glucose-Capillary: 117 mg/dL — ABNORMAL HIGH (ref 70–99)
Glucose-Capillary: 129 mg/dL — ABNORMAL HIGH (ref 70–99)
Glucose-Capillary: 135 mg/dL — ABNORMAL HIGH (ref 70–99)
Glucose-Capillary: 156 mg/dL — ABNORMAL HIGH (ref 70–99)

## 2020-11-03 MED ORDER — OSMOLITE 1.2 CAL PO LIQD: 600.0000 mL | ORAL | Status: DC

## 2020-11-03 NOTE — Progress Notes (Signed)
Calorie Count Note: Day 2 Results  48-hour calorie count ordered. Calorie count started on 3/22 at breakfast meal. Calorie count ended 3/23 after dinner meal. Please see day 2 results below.  Diet: dysphagia 1 with honey-thick liquids Supplements: - Magic Cup TID with meals  11/02/20: Breakfast: 386 kcal, 19 grams of protein Lunch: 492 kcal, 23 grams of protein Dinner: 466 kcal, 16 grams of protein  Day 2 total 24-hour intake: 1344 kcal (79% of minimum estimated needs)  58 grams of protein (68% of minimum estimated needs)  Nutrition Diagnosis: Increased nutrient needsrelated to other (extensive therapies)as evidenced by estimated needs.  Goal: Patient will meet greater than or equal to 90% of their needs  Intervention: - d/c calorie count - recommend discontinuing nocturnal tube feeding; will discuss with MD - Magic Cup TID with meals   [name], MS, RD, LDN Inpatient Clinical Dietitian Please see [facility] for contact information.

## 2020-11-03 NOTE — Progress Notes (Signed)
Speech Language Pathology Daily Session Note  Patient Details  Name: Brittany Griffith MRN: [id number] Date of Birth: 05-15-1951  Today's Date: 11/03/2020 SLP Individual Time: 0732-0827 SLP Individual Time Calculation (min): 55 min  Short Term Goals: Week 1: SLP Short Term Goal 1 (Week 1): Pt will demonstrate efficient mastication and oral clearance of current diet Dys 1 textures, honey thick liquids with Max A cues for use of compensatory swallow strategies. SLP Short Term Goal 2 (Week 1): Pt will consume Dys 2 texture trials with minimal right buccal pocketing and no more than Moderate cues required to clear X3 prior to upgrade. SLP Short Term Goal 3 (Week 1): Pt will consume therapeutic trials of ice and or thin H2O via spoon without decline in respiratory status or other vitals across 1 week prior to repeat MBSS. SLP Short Term Goal 4 (Week 1): Pt will voice on command in 25% of opportunities with Max A multimodal cueing. SLP Short Term Goal 5 (Week 1): Pt will imitate oral motor commands with 25% accuracy with Max A multimodal cueing. SLP Short Term Goal 6 (Week 1): Pt will communicate basic wants and needs via gestures and/or communication board with 75% accuracy with Max A multimodal cueing.  Skilled Therapeutic Interventions: Pt was seen for skilled ST targeting dysphagia and communication goals. Pt's husband was present and also able to assist with interpreting to [place] language as needed. Although pt able to self-feed, SLP provided Max A multimodal cues to help pt self-monitor and correct bolus size (often too large) as well as for awareness and clearance of moderate right buccal pocketing. Pt is not able to use lingual sweeps very successfully yet (although still attempting), and most effective strategy is to pause intake every 3-5 bites for more mastication and 3-5 spoons of honey liquid to assist with clearance. Pt also consumed ~6-7 oz of honey thick juices and milk during breakfast. No  overt s/sx aspiration noted with honey thick liquids, but delayed coughing X2 noted when consuming pureed solids when pt's mouth was packed too full of PO. Pt's husband was actively engaged in session, therefore received more training from SLP regarding how to assist and cue pt for swallowing strategies during session. Recommend continue current diet with full supervision to ensure use of safe and compensatory swallowing strategies.  SLP further facilitated session with opportunities to target voicing, vilotional oral motor commands, and use of communication board. During attempts to use 6-picture communication board, accuracy was unclear due to pt's husband inserting himself multiple times, however she appeared to have more difficulty using it today in comparison to when it was introduced yesterday. She nodded yes/no to answer basic questions regarding her needs and feelings throughout session. She was unable to provide any volitional phonation despite Max A attempts and multimodal cueing (attempted vowels, "mm", humming to music, etc.). However, she did demonstrate improvement in ability to follow some oral motor commands - open mouth (90% accurate), protrude tongue (50% accurate), smile (100% acuate). Pt left sitting upright with alarm set and needs within reach, husband still present. Continue per current plan of care.      Pain Pain Assessment Pain Scale: 0-10 Pain Score: 0-No pain  Therapy/Group: Individual Therapy  Arbutus Leas 11/03/2020, 7:22 AM

## 2020-11-03 NOTE — Progress Notes (Signed)
Urine sent to lab via I/O cath

## 2020-11-03 NOTE — Progress Notes (Signed)
Cortrak removed per order. Pt tolerated well.

## 2020-11-03 NOTE — Progress Notes (Signed)
Daughter in room, requesting medication for bladder and communication board. Discussed with Linna Hoff PA, will check with MD. Unable to locate communication board at this time, will try again when see speech

## 2020-11-03 NOTE — Progress Notes (Signed)
Occupational Therapy Session Note  Patient Details  Name: Brittany Griffith MRN: [id number] Date of Birth: [birth year]  Today's Date: 11/03/2020 OT Individual Time: 4944-9675 OT Individual Time Calculation (min): 30 min    Short Term Goals: Week 1:  OT Short Term Goal 1 (Week 1): Pt will complete UB bathing with mod demonstrational cueing and min assist from supported sitting. OT Short Term Goal 2 (Week 1): Pt will complete LB bathing sit to stand with max assist and mod demonstrational cueing for sequencing. OT Short Term Goal 3 (Week 1): Pt will complete toilet transfer with mod assist squat pivot to drop arm commode. OT Short Term Goal 4 (Week 1): Pt's spouse will return demonstrate safe assist with PROM exercises with the RUE. OT Short Term Goal 5 (Week 1): Pt will maintain static sitting balance with supervision EOB or EOM in preparation for selfcare tasks.  Skilled Therapeutic Interventions/Progress Updates:    Pt in bed to start session with spouse and her daughter present.  Educated pt's daughter on PROM exercises with the RUE to be completed daily and let he return demonstrate them for the shoulder, elbow, wrist, hand, and digits.  Handout was provided with max demonstrational cueing needed for maintaining visual attention to the RUE while ROM was completed,  She continues to be non-verbal to questioning and could not consistently nod head yes or no when asked simple questions such as "Is this you sister?".  After education on ROM, she then worked on transfer to the tilt in space wheelchair.  She needed total assist for rolling to the left side of the bed with max assist for transition to sitting.  She needed max hand over hand assist to position the RUE across her body with rolling.  Once in sitting, she demonstrates slight pusher tendencies to the right with min to mod assist for initial static sitting balance.  Posture remains flexed through the neck and trunk with posterior pelvic tilt  present.  She needed total assist for squat pivot transfer to the right.  Completed scooting back into the chair with max assist.  Educated pt's family on pressure relief for tilting the chair back every 30-45 mins for 2-3 minute intervals.  They were able to voice understanding.  Safety belt in place and LUE supported on pillow in the chair.  Interpreter present throughout session, but only needed part of the time as spouse and daughter able to understand and speak english fluently.    Therapy Documentation Precautions:  Precautions Precautions: Fall,Other (comment) Precaution Comments: global aphasia, dense R hemiparesis, R inattention Restrictions Weight Bearing Restrictions: No  Pain: Pain Assessment Pain Scale: Faces Pain Score: 0-No pain ADL: See Care Tool for some details of mobility and selfcare  Therapy/Group: Individual Therapy  MCGUIRE,JAMES OTR/L 11/03/2020, 4:55 PM

## 2020-11-03 NOTE — Progress Notes (Signed)
Physical Therapy Session Note  Patient Details  Name: Brittany Griffith MRN: [id number] Date of Birth: 03-Mar-1951  Today's Date: 11/03/2020 PT Individual Time: 6314-9702 PT Individual Time Calculation (min): 68 min   Short Term Goals: Week 1:  PT Short Term Goal 1 (Week 1): Pt will consistently perform supine<>sit with max assist of 1 PT Short Term Goal 2 (Week 1): Pt will perform sit<>stands with max assist of 1 using LRAD or +2 HHA PT Short Term Goal 3 (Week 1): Pt will perform bed<>chair transfers with max assist of 1 PT Short Term Goal 4 (Week 1): Pt will participate in pre-gait training with +2 max assist using LRAD  Skilled Therapeutic Interventions/Progress Updates:    Pt received supine in bed with her husband present. In-person interpreter, Melina Modena, present throughout session. Pt remains nonverbal but continues to nod head yes/no in response to questions - pt appears to understand simple English phrases via initiating 1-step commands prior to interpreter having time to communicate - pt appears agreeable to therapy session nodding head "yes" and grinning. Removed B LE intermittent compression device and donned B LE TED hose and pants at bed level via rolling R with max assist and L with total assist - pt initiates assisting with pulling pants up over L hip. Pt starting to demonstrate some impulsivity with decreased safety awareness throughout session via starting to initiate mobility prior to w/c being set-up and in position. Supine>sitting L EOB, total assist for rolling, B LE management off EOB, and trunk upright - demonstrates strong L posterior lean requiring cuing and manual facilitation to achieve midline - requires constant min/mod assist for trunk control from +2 assist while therapist donned shoes max assist. R squat pivot transfer EOB>TIS w/c with +2 max assist for lifting/pivoting hips and maintaining trunk control - pt demonstrates poor motor planning holding onto bedrail with L hand  preventing her hips from being able to rotate towards w/c on the R, cued pt to hold onto therapist's arm to prevent this. Of note: the TIS w/c is too tall for patient (no other shorter TIS w/c available at time of eval).  Transported to/from gym in w/c for time management and energy conservation. Adjusted TIS w/c leg rests for improved fit to support B LEs. R squat pivot TIS w/c>EOM +2 max assist for lifting/pivoting hips and maintaining trunk control - continued to cue pt to hold onto therapist's arm with her L hand as pt demonstrating poor motor planning either pulling or pushing with that arm. Sitting balance task on EOM (placed 6" step under her feet to promote B LE WBing due to height of pt vs height of mat) targeting R UE NMR via therapist facilitating Elma for proprioceptive input, R attention via visually scanning to locate a bean bag and reach cross body to grasp with L hand - demos anterior trunk lean throughout with limited trunk extensor activation to maintain upright in midline therefore adjusted reaching task to promote improved trunk alignment - pt able to visually scan ~10-15degrees R of midline with max cuing (pt continues to respond well to her name when trying to promote R attention) - mirror feedback utilized throughout to improve midline orientation. Sit<>stands to/from EOM with 6" step under pt's feet due to height of pt and height of mat table not allowing it to be low enough for pt's feet to touch floor - 3 reps with +2 max assist for lifting and balance via 3 Musketeer support - blocking R knee and facilitating increased  R hip/knee extension and trunk extension to improve upright posture (continues to demonstrate anterior trunk lean in standing too) - mirror feedback for midline and on final rep pt demos activation of R glute and quad to extend R LE though small ROM without assist/faciltiation. R squat pivot EOM>TIS w/c as above. Transported back to room. Pt starting to initiate mobility to  EOB therefore provided +1 max/total assist L squat pivot to EOB. Sit>supine max assist for R hemibody management and trunk descent. Pt left supine in bed with HOB elevated >30degrees, needs in reach, R UE supported on pillows, and bed alarm on.    Therapy Documentation Precautions:  Precautions Precautions: Fall,Other (comment) Precaution Comments: global aphasia, dense R hemiparesis, R inattention Restrictions Weight Bearing Restrictions: No  Pain:   No indication of pain throughout session - no facial grimacing or moaning noted (pt remains nonverbal only responding to yes/no had nods)   Therapy/Group: Individual Therapy  Tawana Scale , PT, DPT, CSRS  11/03/2020, 7:56 AM

## 2020-11-03 NOTE — Progress Notes (Signed)
Patient ID: Brittany Griffith, female   DOB: Jun 22, 1951, 70 y.o.   MRN: [id number]  SW met with patient, spouse and daughter in pt room. Daughter and pt spouse do not require interpreter due to [place] speaking. Met with pt and family, introduced self, explained role and addressed questions and concerns. Sw will cont to follow up with pt family.  [place], [place]

## 2020-11-03 NOTE — Progress Notes (Signed)
PROGRESS NOTE   Subjective/Complaints: Brittany Griffith makes good eye contact this morning and smiles Her husband is massaging her hand.  He discusses that she was woken in the night for cath. Discussed scheduling caths morning afternoon, and night and he is agreeable.   ROS: +urinary retention as per husband.   Objective:   No results found. Recent Labs    11/01/20 0404 11/02/20 0507  WBC 11.1* 10.6*  HGB 12.9 12.4  HCT 39.2 38.0  PLT 220 223   Recent Labs    11/01/20 0404 11/02/20 0507  NA 150* 143  K 3.2* 3.5  CL 115* 111  CO2 25 24  GLUCOSE 126* 119*  BUN 25* 23  CREATININE 0.76 0.71  CALCIUM 9.5 8.9    Intake/Output Summary (Last 24 hours) at 11/03/2020 1352 Last data filed at 11/03/2020 1259 Gross per 24 hour  Intake 360 ml  Output 3400 ml  Net -3040 ml        Physical Exam: Vital Signs Blood pressure (!) 116/51, pulse 75, temperature 97.8 F (36.6 C), temperature source Oral, resp. rate 18, height 5\' 2"  (1.575 m), weight 60 kg, SpO2 94 %. Gen: no distress, normal appearing HEENT: oral mucosa pink and moist, NCAT Cardio: Reg rate Chest: normal effort, normal rate of breathing Abd: soft, non-distended Ext: no edema Psych: pleasant, normal affect Skin: intact Genitourinary:    Comments: purewick in place Musculoskeletal:     Cervical back: Normal range of motion and neck supple.     Comments: LUE in biceps, grip and finger abd 5-/5- also likely triceps, however pt kept perseverating during exam on previous command RUE- 0/5 except trace in deltoid LLE- HF, DF and PF 5-/5-  RLE- HF 2/5, KE 2+/5, DF 0/5 and Pt couldn't do PF due to apraxia?  Skin:    General: Skin is warm and dry.     Comments: No skin breakdown seen- could not turn to assess backside- no breakdown on heels B/L  Neurological:     Comments: Patient is alert no acute distress.  Makes eye contact with examiner.  She is aphasic with  language barrier.  Follows simple demonstrated commands.  Pt is globally aphasic, however appears to understand some things, just not everything- nonverbal- did shake head yes/no 1x- and showed was very apraxic/perseverative during exam Attempted to assess sensation- not able to tell with aphasia.   Psychiatric:     Comments: Flat- nonverbal- hard to assess     Assessment/Plan: 1. Functional deficits which require 3+ hours per day of interdisciplinary therapy in a comprehensive inpatient rehab setting.  Physiatrist is providing close team supervision and 24 hour management of active medical problems listed below.  Physiatrist and rehab team continue to assess barriers to discharge/monitor patient progress toward functional and medical goals  Care Tool:  Bathing    Body parts bathed by patient: Chest,Right upper leg,Left upper leg,Left lower leg,Face   Body parts bathed by helper: Right arm,Left arm,Abdomen,Front perineal area,Buttocks,Right lower leg     Bathing assist Assist Level: Total Assistance - Patient < 25%     Upper Body Dressing/Undressing Upper body dressing   What is the patient wearing?:  Pull over shirt    Upper body assist Assist Level: Total Assistance - Patient < 25%    Lower Body Dressing/Undressing Lower body dressing      What is the patient wearing?: Incontinence brief     Lower body assist Assist for lower body dressing: Maximal Assistance - Patient 25 - 49%     Toileting Toileting    Toileting assist Assist for toileting: Dependent - Patient 0%     Transfers Chair/bed transfer  Transfers assist     Chair/bed transfer assist level: 2 Helpers (squat pivot +2 max assist)     Locomotion Ambulation   Ambulation assist   Ambulation activity did not occur: N/A          Walk 10 feet activity   Assist  Walk 10 feet activity did not occur: Safety/medical concerns        Walk 50 feet activity   Assist Walk 50 feet with 2  turns activity did not occur: Safety/medical concerns         Walk 150 feet activity   Assist Walk 150 feet activity did not occur: Safety/medical concerns         Walk 10 feet on uneven surface  activity   Assist Walk 10 feet on uneven surfaces activity did not occur: Safety/medical concerns         Wheelchair     Assist Will patient use wheelchair at discharge?:  (TBD)             Wheelchair 50 feet with 2 turns activity    Assist            Wheelchair 150 feet activity     Assist          Blood pressure (!) 116/51, pulse 75, temperature 97.8 F (36.6 C), temperature source Oral, resp. rate 18, height 5\' 2"  (1.575 m), weight 60 kg, SpO2 94 %.    Medical Problem List and Plan: 1.  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  Impaired mobility:  -DVT/anticoagulation: Continue Lovenox, does have bruising             -antiplatelet therapy: Aspirin 81 mg daily and Brilinta 90 mg twice daily 3. Pain Management: Tylenol as needed 4. Mood: Provide emotional support             -antipsychotic agents: N/A 5. Neuropsych: This patient is not capable of making decisions on her own behalf. 6. Skin/Wound Care: Routine skin checks 7. Fluids/Electrolytes/Nutrition: Routine in and outs with follow-up chemistries 8.  Dysphagia.  Dysphagia #1 honey thick liquids.  Nasogastric tube for nutritional support.  Dietary follow-up.  Speech therapy follow-up- did eat 40-50% of lunch today, FYI 9.  Hypertension.  Norvasc 10 mg daily, Cozaar 50 mg twice daily.  Monitor with increased mobility  3/24: labile: continue to monitor.  10.  Seizure prophylaxis.  Keppra 500 mg twice daily.  EEG negative 11.  Hyperlipidemia.  Lipitor 12.  Incidental findings meningioma.   Follow-up outpatient neurosurgery Dr. Venetia Constable 13.  Hyperglycemia related to tube feeds.  SSI 14. Poor initiation/Aphasia- suggested Amantadine- family wanted to talk before deciding yes or no  3/23: discussed starting amantadine vs Ritalin with daughter  and she is going to research both options and get back to me.  15. Urinary retention:  3/24: check UA today, schedule caths three times per day during day to avoid waking patient during night. Discussed medication options but husband defers at this time 54. Disposition: I think pt's husband should be able to spend the night, esp for language barrier and reducing nursing burden-however said would allow primary rehab physician to make call past first night.   LOS: 2 days A FACE TO FACE EVALUATION WAS PERFORMED  Clide Deutscher Raulkar 11/03/2020, 1:52 PM

## 2020-11-04 LAB — GLUCOSE, CAPILLARY
Glucose-Capillary: 106 mg/dL — ABNORMAL HIGH (ref 70–99)
Glucose-Capillary: 117 mg/dL — ABNORMAL HIGH (ref 70–99)
Glucose-Capillary: 139 mg/dL — ABNORMAL HIGH (ref 70–99)
Glucose-Capillary: 143 mg/dL — ABNORMAL HIGH (ref 70–99)
Glucose-Capillary: 79 mg/dL (ref 70–99)
Glucose-Capillary: 98 mg/dL (ref 70–99)

## 2020-11-04 MED ORDER — AMLODIPINE BESYLATE 5 MG PO TABS: 5.0000 mg | ORAL_TABLET | Freq: Every day | ORAL | Status: DC

## 2020-11-04 MED ORDER — BETHANECHOL CHLORIDE 10 MG PO TABS
5.0000 mg | ORAL_TABLET | Freq: Three times a day (TID) | ORAL | Status: DC
  Administered 2020-11-04 – 2020-11-06 (×8): 5 mg via ORAL
  Filled 2020-11-04 (×8): qty 1

## 2020-11-04 NOTE — IPOC Note (Signed)
Overall Plan of Care [facility]) Patient Details Name: Brittany Griffith MRN: [id number] DOB: 23-Sep-1950  Admitting Diagnosis: Global aphasia  Hospital Problems: Principal Problem:   Global aphasia Active Problems:   left MCA stroke   Meningioma ([place])   Cerebral edema ([place])   Hypertensive urgency   Left middle cerebral artery stroke ([place])   Dysphagia     Functional Problem List: Nursing Medication Management,Safety,Nutrition,Bowel,Bladder,Endurance  PT Balance,Perception,Behavior,Safety,Edema,Sensory,Endurance,Skin Integrity,Motor,Nutrition,Pain  OT Balance,Motor,Cognition,Perception,Safety,Sensory  SLP Perception,Cognition,Linguistic,Motor,Nutrition  TR         Basic ADL's: OT Eating,Grooming,Bathing,Dressing,Toileting     Advanced  ADL's: OT       Transfers: PT Bed Mobility,Bed to Chair,Car,Furniture  OT Toilet,Tub/Shower     Locomotion: PT Ambulation,Wheelchair Mobility,Stairs     Additional Impairments: OT Fuctional Use of Upper Extremity  SLP Communication,Social Cognition,Swallowing expression,comprehension Awareness,Problem Solving  TR      Anticipated Outcomes Item Anticipated Outcome  Self Feeding supervision  Swallowing  Min   Basic self-care  min assist  Toileting  min assist   Bathroom Transfers min assist  Bowel/Bladder  Manage bowel and bladder with mod I assist  Transfers  min assist  Locomotion  min assist limited distances using LRAD  Communication  Min-Mod  Cognition     Pain  n/a  Safety/Judgment  Maintain safety with cues/reminders   Therapy Plan: PT Intensity: Minimum of 1-2 x/day ,45 to 90 minutes PT Frequency: 5 out of 7 days PT Duration Estimated Length of Stay: 3.5-4 weeks OT Intensity: Minimum of 1-2 x/day, 45 to 90 minutes OT Frequency: 5 out of 7 days OT Duration/Estimated Length of Stay: 25-28 days SLP Intensity: Minumum of 1-2 x/day, 30 to 90 minutes SLP Frequency: 3 to 5 out of 7 days SLP Duration/Estimated  Length of Stay: 3.5-4 weeks   Due to the current state of emergency, patients may not be receiving their 3-hours of Medicare-mandated therapy.   Team Interventions: Nursing Interventions Patient/Family Education,Bowel Management,Bladder Management,Disease Management/Prevention,Medication Web designer  PT interventions Ambulation/gait training,Community Corporate treasurer re-education,Psychosocial support,Stair training,UE/LE Strength taining/ROM,Wheelchair propulsion/positioning,Discharge planning,Balance/vestibular training,Functional electrical stimulation,Pain management,Skin care/wound management,Therapeutic Activities,UE/LE Coordination activities,Cognitive remediation/compensation,Disease management/prevention,Functional mobility training,Patient/family education,Splinting/orthotics,Therapeutic Exercise,Visual/perceptual remediation/compensation  OT Interventions Balance/vestibular training,Discharge planning,Functional electrical stimulation,Pain management,Self Care/advanced ADL retraining,Therapeutic Activities,UE/LE Coordination activities,Cognitive remediation/compensation,Disease mangement/prevention,Functional mobility training,Patient/family education,Therapeutic Exercise,Visual/perceptual remediation/compensation,DME/adaptive equipment instruction,Neuromuscular re-education,Psychosocial support,Splinting/orthotics,UE/LE Strength taining/ROM,Wheelchair propulsion/positioning  SLP Interventions Cognitive remediation/compensation,Cueing hierarchy,Dysphagia/aspiration precaution training,Functional tasks,Patient/family education,Therapeutic Activities,Speech/Language facilitation,Multimodal communication approach,Internal/external aids  TR Interventions    SW/CM Interventions Discharge Planning,Psychosocial Support,Patient/Family Education,Disease Management/Prevention   Barriers to  Discharge MD  Medical stability  Nursing Decreased caregiver support,Incontinence,Nutrition means    PT Incontinence,Neurogenic Bowel & Bladder    OT      SLP      SW Insurance for SNF coverage     Team Discharge Planning: Destination: PT-Home ,OT- Home , SLP-Home Projected Follow-up: PT-Home health PT,24 hour supervision/assistance, OT-  24 hour supervision/assistance, SLP-Home Health SLP,24 hour supervision/assistance Projected Equipment Needs: PT-To be determined, OT- To be determined, SLP-None recommended by SLP Equipment Details: PT- , OT-  Patient/family involved in discharge planning: PT- Patient unable/family or caregiver not available,Other (Comment) (pt appears agreeable with full participation and compliance during session),  OT-Family member/caregiver, SLP-Patient,Family member/caregiver  MD ELOS: 15-20 days [facility] Medical Rehab Prognosis:  Good Assessment: Brittany Griffith is a 70 year old woman who is admitted to CIR with right side weakness with aphasia/dysphagia secondary to 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 Active medical issues  include urinary retention, dysphagia, aphasia, hypertension, and incidental finding of meningioma.    See Team Conference Notes for weekly updates to the plan of care

## 2020-11-04 NOTE — Progress Notes (Signed)
Physical Therapy Session Note  Patient Details  Name: Brittany Griffith MRN: [id number] Date of Birth: 06-28-1951  Today's Date: 11/04/2020 PT Individual Time: 0800-0855 PT Individual Time Calculation (min): 55 min   Short Term Goals: Week 1:  PT Short Term Goal 1 (Week 1): Pt will consistently perform supine<>sit with max assist of 1 PT Short Term Goal 2 (Week 1): Pt will perform sit<>stands with max assist of 1 using LRAD or +2 HHA PT Short Term Goal 3 (Week 1): Pt will perform bed<>chair transfers with max assist of 1 PT Short Term Goal 4 (Week 1): Pt will participate in pre-gait training with +2 max assist using LRAD  Skilled Therapeutic Interventions/Progress Updates:   Patient received supine in bed, NT present finishing pericare/dressing at bedlevel, agreeable to PT. She denies pain. Husband present and interpretor present partway through therapy session. She was able to come sit edge of bed with MaxA and MinA to maintain static sitting balance at edge of bed. Stedy used to transfer to wc with MinA to come to stand. +2 present for safety and trunk control. Husband requested that patient be able to finish her breakfast. PT provided full supervision. Noted that patient was very impulsive with her intake, taking large bites and often going to take another bite before finishing what was in her mouth. Significant amount of R pocketing noted. Patient able to minimally clear with small sips of honey thick tea, but could not follow cues to complete tongue sweep. At one point, PT removed spoon from tray to allow patient to finish what was in her mouth- patient picked up straw from her drink and attempted to scoop up more food with her straw to eat more. Patient becoming visibly frustrated, but PT educated patient and husband on importance of safe swallow strategies implemented by ST. PT transported patient to therapy gym when complete. R attention tasks completed in sitting with Max cues to attend to R to  retrieve items from PTs hands. Patient able to visually scan ~20* to the R, but was able to grab items from PTs hands that were ~30* to the R. Despite interpretor present, patient with significant difficulty following simple cues to hand PT 1 item. Patient able to kick R LE to target with Max multimodal cues. Noting poor carryover between repetitive tasks. Patient able to achieve ~75% full AROM in RLE. Patient returning to room in wc, seatbelt alarm on, call light within reach.   Therapy Documentation Precautions:  Precautions Precautions: Fall,Other (comment) Precaution Comments: global aphasia, dense R hemiparesis, R inattention Restrictions Weight Bearing Restrictions: No    Therapy/Group: Individual Therapy  Karoline Caldwell, PT, DPT, CBIS  11/04/2020, 7:37 AM

## 2020-11-04 NOTE — Progress Notes (Signed)
PROGRESS NOTE   Subjective/Complaints: No complaints this morning. Urecholine started for urinary retention. She is making eye contact and smiles Happy Cortrak was removed yesterday   ROS: +urinary retention, appetite improving   Objective:   No results found. Recent Labs    11/02/20 0507  WBC 10.6*  HGB 12.4  HCT 38.0  PLT 223   Recent Labs    11/02/20 0507  NA 143  K 3.5  CL 111  CO2 24  GLUCOSE 119*  BUN 23  CREATININE 0.71  CALCIUM 8.9    Intake/Output Summary (Last 24 hours) at 11/04/2020 0093 Last data filed at 11/04/2020 0100 Gross per 24 hour  Intake 480 ml  Output 1872 ml  Net -1392 ml        Physical Exam: Vital Signs Blood pressure (!) 125/59, pulse 72, temperature 98.1 F (36.7 C), temperature source Oral, resp. rate 14, height 5\' 2"  (1.575 m), weight 60 kg, SpO2 96 %. Gen: no distress, normal appearing HEENT: oral mucosa pink and moist, NCAT Cardio: Reg rate Chest: normal effort, normal rate of breathing Abd: soft, non-distended Ext: no edema Psych: pleasant, normal affect Genitourinary:    Comments: purewick in place Musculoskeletal:     Cervical back: Normal range of motion and neck supple.     Comments: LUE in biceps, grip and finger abd 5-/5- also likely triceps, however pt kept perseverating during exam on previous command RUE- 0/5 except trace in deltoid LLE- HF, DF and PF 5-/5-  RLE- HF 2/5, KE 2+/5, DF 0/5 and Pt couldn't do PF due to apraxia?  Skin:    General: Skin is warm and dry.     Comments: No skin breakdown seen- could not turn to assess backside- no breakdown on heels B/L  Neurological:     Comments: Patient is alert no acute distress.  She is aphasic with language barrier.  Follows simple demonstrated commands.  Pt is globally aphasic, however appears to understand some things, just not everything- nonverbal- did shake head yes/no 1x- and showed was very  apraxic/perseverative during exam, able to smile and make good eye contact Attempted to assess sensation- not able to tell with aphasia.   Psychiatric:     Comments: Flat- nonverbal- hard to assess     Assessment/Plan: 1. Functional deficits which require 3+ hours per day of interdisciplinary therapy in a comprehensive inpatient rehab setting.  Physiatrist is providing close team supervision and 24 hour management of active medical problems listed below.  Physiatrist and rehab team continue to assess barriers to discharge/monitor patient progress toward functional and medical goals  Care Tool:  Bathing    Body parts bathed by patient: Chest,Right upper leg,Left upper leg,Left lower leg,Face   Body parts bathed by helper: Right arm,Left arm,Abdomen,Front perineal area,Buttocks,Right lower leg     Bathing assist Assist Level: Total Assistance - Patient < 25%     Upper Body Dressing/Undressing Upper body dressing   What is the patient wearing?: Pull over shirt    Upper body assist Assist Level: Total Assistance - Patient < 25%    Lower Body Dressing/Undressing Lower body dressing      What is the patient  wearing?: Incontinence brief     Lower body assist Assist for lower body dressing: Maximal Assistance - Patient 25 - 49%     Toileting Toileting    Toileting assist Assist for toileting: 2 Helpers     Transfers Chair/bed transfer  Transfers assist     Chair/bed transfer assist level: 2 Helpers     Locomotion Ambulation   Ambulation assist   Ambulation activity did not occur: N/A          Walk 10 feet activity   Assist  Walk 10 feet activity did not occur: Safety/medical concerns        Walk 50 feet activity   Assist Walk 50 feet with 2 turns activity did not occur: Safety/medical concerns         Walk 150 feet activity   Assist Walk 150 feet activity did not occur: Safety/medical concerns         Walk 10 feet on uneven  surface  activity   Assist Walk 10 feet on uneven surfaces activity did not occur: Safety/medical concerns         Wheelchair     Assist Will patient use wheelchair at discharge?:  (TBD)             Wheelchair 50 feet with 2 turns activity    Assist            Wheelchair 150 feet activity     Assist          Blood pressure (!) 125/59, pulse 72, temperature 98.1 F (36.7 C), temperature source Oral, resp. rate 14, height 5\' 2"  (1.575 m), weight 60 kg, SpO2 96 %.    Medical Problem List and Plan: 1.  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  Impaired mobility:  -DVT/anticoagulation: Continue Lovenox, does have bruising             -antiplatelet therapy: Aspirin 81 mg daily and Brilinta 90 mg twice daily 3. Pain Management: Tylenol as needed 4. Mood: Provide emotional support             -antipsychotic agents: N/A 5. Neuropsych: This patient is not capable of making decisions on her own behalf. 6. Skin/Wound Care: Routine skin checks 7. Fluids/Electrolytes/Nutrition: Routine in and outs with follow-up chemistries 8.  Dysphagia.  Dysphagia #1 honey thick liquids.  Nasogastric tube d/ced given improved appetite.  9.  Hypertension.  Norvasc 10 mg daily, Cozaar 50 mg twice daily.  Monitor with increased mobility  3/25: has been on lower side with no recent high reads: decrease Norvasc to 5mg .  10.  Seizure prophylaxis.  Keppra 500 mg twice daily.  EEG negative 11.  Hyperlipidemia.  Lipitor 12.  Incidental findings meningioma.  Follow-up outpatient neurosurgery Dr. Venetia Constable 13.  Hyperglycemia related to tube feeds.  SSI 14. Poor initiation/Aphasia- suggested Amantadine- family wanted to talk before deciding yes or no  3/23: discussed starting  amantadine vs Ritalin with daughter and she is going to research both options and get back to me.  15. Urinary retention:  3/25: UA was negative. Start urecholine 5mg  TID. Daughter in agreement. Caths scheduled during the day so she is not woken at night.  15. Disposition:  I think pt's husband should be able to spend the night, esp for language barrier and reducing nursing burden-however said would allow primary rehab physician to make call past first night.   LOS: 3 days A FACE TO FACE EVALUATION WAS PERFORMED  Clide Deutscher Linh Hedberg 11/04/2020, 9:28 AM

## 2020-11-04 NOTE — Progress Notes (Signed)
Speech Language Pathology Daily Session Note  Patient Details  Name: Brittany Griffith MRN: [id number] Date of Birth: 06-Oct-1950  Today's Date: 11/04/2020 SLP Individual Time: 1300-1355 SLP Individual Time Calculation (min): 55 min  Short Term Goals: Week 1: SLP Short Term Goal 1 (Week 1): Pt will demonstrate efficient mastication and oral clearance of current diet Dys 1 textures, honey thick liquids with Max A cues for use of compensatory swallow strategies. SLP Short Term Goal 2 (Week 1): Pt will consume Dys 2 texture trials with minimal right buccal pocketing and no more than Moderate cues required to clear X3 prior to upgrade. SLP Short Term Goal 3 (Week 1): Pt will consume therapeutic trials of ice and or thin H2O via spoon without decline in respiratory status or other vitals across 1 week prior to repeat MBSS. SLP Short Term Goal 4 (Week 1): Pt will voice on command in 25% of opportunities with Max A multimodal cueing. SLP Short Term Goal 5 (Week 1): Pt will imitate oral motor commands with 25% accuracy with Max A multimodal cueing. SLP Short Term Goal 6 (Week 1): Pt will communicate basic wants and needs via gestures and/or communication board with 75% accuracy with Max A multimodal cueing.  Skilled Therapeutic Interventions: Pt was seen for skilled ST targeting dysphagia and communication goals. Nepali interpretor present to translate into pt's preferred language. SLP provided Min A for self feeding of Dys 1/honey thick liquid lunch items. She does require physical assistance to control bolus size, and moderate right buccal pocketing of solids noted. Pt unable to use lingual sweep, but with Max A cueing to pause every 3-5 bites and administration of 3-5 spoons of honey liquid, solids are cleared. Pt also benefits from oral care after meals. Immediate cough X1 noted after a spoonful of honey liquid (she consumed ~6 oz total). Recommend continue current diet with full supervision for use of  swallowing strategies. Pt demonstrated some spontaneous and volitional phonation today. She spontaneously voiced "mm" accompanying a head shake yes to a question from SLP. She also demonstrated ability to produce "mm" on command with Max A multimodal cues in ~30% of trials. She produced and approximation of "ah" in ~50% of attempts. Pt required hand over hand assistance to trace her name X1, however when repeated she was 60% accurate with trace ability (her name only). Pt left laying in bed with alarm set and needs within reach. Continue per current plan of care.          Pain Pain Assessment Pain Scale: 0-10 Pain Score: 0-No pain  Therapy/Group: Individual Therapy  Arbutus Leas 11/04/2020, 7:25 AM

## 2020-11-04 NOTE — Plan of Care (Signed)
  Problem: Consults Goal: RH STROKE PATIENT EDUCATION Description: See Patient Education module for education specifics  Outcome: Progressing   Problem: RH BOWEL ELIMINATION Goal: RH STG MANAGE BOWEL WITH ASSISTANCE Description: STG Manage Bowel with mod I Assistance. Outcome: Progressing   Problem: RH BLADDER ELIMINATION Goal: RH STG MANAGE BLADDER WITH ASSISTANCE Description: STG Manage Bladder With  mod I Assistance Outcome: Progressing   Problem: RH SAFETY Goal: RH STG ADHERE TO SAFETY PRECAUTIONS W/ASSISTANCE/DEVICE Description: STG Adhere to Safety Precautions With cues/reminders Assistance/Device. Outcome: Progressing   Problem: RH KNOWLEDGE DEFICIT Goal: RH STG INCREASE KNOWLEDGE OF HYPERTENSION Description: Patient and spouse/dtr will be able to manage HTN with medications and dietary modifications using handouts and educational materials independently Outcome: Progressing Goal: RH STG INCREASE KNOWLEDGE OF DYSPHAGIA/FLUID INTAKE Description: Patient and spouse/dtr will be able to manage dysphagia /dietary modifications using handouts and educational materials independently Outcome: Progressing Goal: RH STG INCREASE KNOWLEGDE OF HYPERLIPIDEMIA Description: Patient and spouse/dtr will be able to manage HLD with medications and dietary modifications using handouts and educational materials independently Outcome: Progressing Goal: RH STG INCREASE KNOWLEDGE OF STROKE PROPHYLAXIS Description: Patient and spouse/dtr will be able to manage secondary stroke risks with medications and dietary modifications and stroke prophylaxis using handouts and educational materials independently Outcome: Progressing

## 2020-11-04 NOTE — Progress Notes (Signed)
Occupational Therapy Session Note  Patient Details  Name: Brittany Griffith MRN: [id number] Date of Birth: 1951/08/06  Today's Date: 11/04/2020 OT Individual Time: 9528-4132 OT Individual Time Calculation (min): 49 min    Short Term Goals: Week 1:  OT Short Term Goal 1 (Week 1): Pt will complete UB bathing with mod demonstrational cueing and min assist from supported sitting. OT Short Term Goal 2 (Week 1): Pt will complete LB bathing sit to stand with max assist and mod demonstrational cueing for sequencing. OT Short Term Goal 3 (Week 1): Pt will complete toilet transfer with mod assist squat pivot to drop arm commode. OT Short Term Goal 4 (Week 1): Pt's spouse will return demonstrate safe assist with PROM exercises with the RUE. OT Short Term Goal 5 (Week 1): Pt will maintain static sitting balance with supervision EOB or EOM in preparation for selfcare tasks.  Skilled Therapeutic Interventions/Progress Updates:    Pt up in the wheelchair with interpreter and her spouse present.  Agreed to engage in selfcare tasks sit to stand at the sink.  She needed max assist for sitting balance unsupported in the chair.  When supported she needed overall max assist for initiation and sequencing of all bathing.  She continues to demonstrate decreased consistency with following instructional commands and needs max demonstrational cueing to keep from perseverating on washing her face and for transitioning to other parts.  Max hand over hand with integration of the RUE for holding the soap and deodorant to open the top and for washing the LUE.  Still with decreased attention to the right side, requiring therapist to turn her head at times to visually fixate on the right hand.  She needed max assist for sit to stand while removing gown, pants, and then re-applying the pants.  Increased activity noted in the RLE as her knee did not buckle with standing.  She needed max assist for donning her pullover shirt as well as  her pants.  Decreased ability to sequence task or understand to start with the RUE or RLE first.  Finished session with therapist donning bilateral TEDs and then providing max assist for donning gripper socks.  When presented with the gripper sock and having her LLE crossed over the right knee, she attempted to reach down with the LUE to initiate donning the sock with max assist to complete task.  She was left sitting in the tilt in space wheelchair with the safety belt in place and spouse present.  NMES was applied to the right shoulder for treatment of subluxation and for facilitation of movement.  She was able to tolerate intensity at 6 for 60 mins without any adverse reactions.  See below for details. Saebo Stim One 330 pulse width 35 Hz pulse rate On 8 sec/ off 8 sec Ramp up/ down 2 sec Symmetrical Biphasic wave form  Max intensity 128mA at 500 Ohm load       Therapy Documentation Precautions:  Precautions Precautions: Fall,Other (comment) Precaution Comments: global aphasia, dense R hemiparesis, R inattention Restrictions Weight Bearing Restrictions: No  Pain: Pain Assessment Pain Scale: Faces Pain Score: 0-No pain ADL:  See Care Tool Section for some details of mobility and selfcare   Therapy/Group: Individual Therapy  MCGUIRE,JAMES OTR/L 11/04/2020, 12:56 PM

## 2020-11-05 LAB — GLUCOSE, CAPILLARY
Glucose-Capillary: 103 mg/dL — ABNORMAL HIGH (ref 70–99)
Glucose-Capillary: 104 mg/dL — ABNORMAL HIGH (ref 70–99)
Glucose-Capillary: 111 mg/dL — ABNORMAL HIGH (ref 70–99)
Glucose-Capillary: 126 mg/dL — ABNORMAL HIGH (ref 70–99)
Glucose-Capillary: 89 mg/dL (ref 70–99)
Glucose-Capillary: 93 mg/dL (ref 70–99)

## 2020-11-05 LAB — URINE CULTURE: Culture: 20000 — AB

## 2020-11-05 MED ORDER — AMLODIPINE BESYLATE 5 MG PO TABS
5.0000 mg | ORAL_TABLET | Freq: Every day | ORAL | Status: DC
  Administered 2020-11-05 – 2020-11-22 (×16): 5 mg via ORAL
  Filled 2020-11-05 (×18): qty 1

## 2020-11-05 MED ORDER — INSULIN ASPART 100 UNIT/ML ~~LOC~~ SOLN
0.0000 [IU] | Freq: Three times a day (TID) | SUBCUTANEOUS | Status: DC
  Administered 2020-11-06 – 2020-11-08 (×3): 1 [IU] via SUBCUTANEOUS
  Administered 2020-11-08: 2 [IU] via SUBCUTANEOUS
  Administered 2020-11-09 – 2020-11-21 (×4): 1 [IU] via SUBCUTANEOUS

## 2020-11-05 MED ORDER — INSULIN ASPART 100 UNIT/ML ~~LOC~~ SOLN: 0.0000 [IU] | Freq: Three times a day (TID) | SUBCUTANEOUS | Status: DC

## 2020-11-05 NOTE — Progress Notes (Signed)
Speech Language Pathology Daily Session Note  Patient Details  Name: Brittany Griffith MRN: [id number] Date of Birth: 08/18/1950  Today's Date: 11/05/2020 SLP Individual Time: 1302-1359 SLP Individual Time Calculation (min): 57 min  Short Term Goals: Week 1: SLP Short Term Goal 1 (Week 1): Pt will demonstrate efficient mastication and oral clearance of current diet Dys 1 textures, honey thick liquids with Max A cues for use of compensatory swallow strategies. SLP Short Term Goal 2 (Week 1): Pt will consume Dys 2 texture trials with minimal right buccal pocketing and no more than Moderate cues required to clear X3 prior to upgrade. SLP Short Term Goal 3 (Week 1): Pt will consume therapeutic trials of ice and or thin H2O via spoon without decline in respiratory status or other vitals across 1 week prior to repeat MBSS. SLP Short Term Goal 4 (Week 1): Pt will voice on command in 25% of opportunities with Max A multimodal cueing. SLP Short Term Goal 5 (Week 1): Pt will imitate oral motor commands with 25% accuracy with Max A multimodal cueing. SLP Short Term Goal 6 (Week 1): Pt will communicate basic wants and needs via gestures and/or communication board with 75% accuracy with Max A multimodal cueing.  Skilled Therapeutic Interventions:Skilled ST services focused on education and communication skills. Pt's son, daughter-in-law and interpretor were present. Pt was unable to imitate automatic voicing in yawn, sighs, coughs nor count 1-3, sign happy birthday, however voicing was noted at phoneme level constant and vowels (approimately x7) when attempting to speak. Pt was able to imitate opening and closing of oral cavity, and protruding tongue, but was not able to laterize or purse lips. Pt was able to response to yes/no questions pertaining to self/environment and disgusting female verse female in pictures with the use of primarily gestures, slight head nod up/down for YES and facial grimace/smirk for NO. SLP  posted sign above bed to assist others in communicating with pt. Pt demonstrated 80% accuracy with yes/no questions when the above system was used. SLP facilitated confrontation naming of common objects and pictures if words beginning with initial /b/ and /m/ phonemes, with ability to produce the final VC sounds for "ball" and "pen" with max A sentence completion, phonemic and imitation cues. SLP provided education to family on how to cue pt with sentence completion, phonemic and imitation cues. Family returned demonstration and all questions answered to satisfaction. Pt was left in room with family, call bell within reach and bed alarm set. SLP recommends to continue skilled services.     Pain Pain Assessment Pain Score: 0-No pain  Therapy/Group: Individual Therapy  Keldric Poyer  [facility] 11/05/2020, 3:19 PM

## 2020-11-05 NOTE — Progress Notes (Signed)
Occupational Therapy Session Note  Patient Details  Name: Brittany Griffith Service MRN: [id number] Date of Birth: 12-Nov-1950  Today's Date: 11/05/2020 OT Individual Time: 0802-0904 OT Individual Time Calculation (min): 62 min    Short Term Goals: Week 1:  OT Short Term Goal 1 (Week 1): Pt will complete UB bathing with mod demonstrational cueing and min assist from supported sitting. OT Short Term Goal 2 (Week 1): Pt will complete LB bathing sit to stand with max assist and mod demonstrational cueing for sequencing. OT Short Term Goal 3 (Week 1): Pt will complete toilet transfer with mod assist squat pivot to drop arm commode. OT Short Term Goal 4 (Week 1): Pt's spouse will return demonstrate safe assist with PROM exercises with the RUE. OT Short Term Goal 5 (Week 1): Pt will maintain static sitting balance with supervision EOB or EOM in preparation for selfcare tasks.  Skilled Therapeutic Interventions/Progress Updates:    Pt in bed to start sessio, worked on transfer to sitting with mod assist.  Initial standing balanced was at min guard assist after scooting to the EOB with mod assist.  She was able to work on donning her pants with max assist for dynamic sitting balance.  Therapist assisted with crossing the RLE over the left knee to help with hemidressing, however pt continued to try and put her LLE in first.  Even with spouse assisting, she would continue to place LLE in, neglecting the RLE.  Next, she completed stand pivot transfer to the right to the [place] wheelchair with max assist.  Rolled her to the therapy gym where she completed transfer to the therapy mat at the same level.  Worked on dynamic sitting balance, visual scanning to the right, and weightbearing through the RLE and RUE.  Max demonstrational cueing for placing clothespins on the horizontal bars in front of her with the LUE.  Progressed to reaching from squat position with mod assist to increase weightbearing through the RUE and RLE.  Pt  with likely right visual field deficit as she demonstrated difficulty scanning to the right for locating clothespins on the table.  Finished with functional mobility with total +2 (pt 25%) to another chair in the gym approximately 20' away.  Max assist needed for advancement of the RLE with each set.  She was able to ambulate for two intervals.  Returned to the wheelchair and back to the room.  NMES was applied to the right shoulder for facilitation of movement and for decreasing subluxation.  Intensity was at level 6 with stimulation lasting for 60 mins.  No adverse reactions noted to stimulation see below for parameters.  Pt was left with spouse and interpreter for next PT session.   Saebo Stim One 330 pulse width 35 Hz pulse rate On 8 sec/ off 8 sec Ramp up/ down 2 sec Symmetrical Biphasic wave form  Max intensity 167mA at 500 Ohm load   Therapy Documentation Precautions:  Precautions Precautions: Fall,Other (comment) Precaution Comments: global aphasia, dense R hemiparesis, R inattention Restrictions Weight Bearing Restrictions: No  Pain: Pain Assessment Pain Scale: 0-10 Pain Score: 0-No pain Faces Pain Scale: No hurt ADL: See Care Tool Section for some details of mobility and selfcare  Therapy/Group: Individual Therapy  Gabor Lusk OTR/L 11/05/2020, 12:15 PM

## 2020-11-05 NOTE — Progress Notes (Signed)
Physical Therapy Session Note  Patient Details  Name: Brittany Griffith MRN: [id number] Date of Birth: Jan 03, 1951  Today's Date: 11/05/2020 PT Individual Time: 0905-1000 PT Individual Time Calculation (min): 55 min   Short Term Goals: Week 1:  PT Short Term Goal 1 (Week 1): Pt will consistently perform supine<>sit with max assist of 1 PT Short Term Goal 2 (Week 1): Pt will perform sit<>stands with max assist of 1 using LRAD or +2 HHA PT Short Term Goal 3 (Week 1): Pt will perform bed<>chair transfers with max assist of 1 PT Short Term Goal 4 (Week 1): Pt will participate in pre-gait training with +2 max assist using LRAD  Skilled Therapeutic Interventions/Progress Updates:    Patient received sitting up in wc, agreeable to PT. Husband and interpretor present. She denies pain. PT transporting patient in wc to therapy gym for time management. MaxA x2 squat pivot to therapy mat with consistent MinA to maintain static sitting balance with B LE supported on box. Patient able to engage in lateral reaching task with MinA/ModA to maintain midline. Mirror provided to assist in midline orientation. Patient able to scan to the R more consistently to retrieve items from PT, but still required Max verbal cues to do so. R UE propped on mat to assist with weight bearing through UE. Patient able to reach outside BOS to the R with L UE, but often required ModA to regain center. PT attempting to guide patient through pre-gait exercises in standing with R LE blocked + MaxA for postural support + facilitated weight shift R to allow for advancement of L LE to target. Patient unable to follow cues sufficiently to complete task. Patient ambulating x2ft, x65ft with MaxA + MinA and wc follow for safety. Patient able to initiate advancing RLE, but required MaxA to take sufficient step. Intermittent R knee blocking. She would fluctuate between maintaining crouched posture of R knee or hyperextension. No buckling noted. Patient  with very strong forward lean requiring MaxA/TotalA from PT to facilitate upright posture. Patient also taking very short step on L despite facilitated weight shift R and cues to take longer step. Patient returning to room in wc, seatbelt alarm on, call light within reach.   Therapy Documentation Precautions:  Precautions Precautions: Fall,Other (comment) Precaution Comments: global aphasia, dense R hemiparesis, R inattention Restrictions Weight Bearing Restrictions: No    Therapy/Group: Individual Therapy  Karoline Caldwell, PT, DPT, CBIS  11/05/2020, 7:34 AM

## 2020-11-06 DIAGNOSIS — D329 Benign neoplasm of meninges, unspecified: Secondary | ICD-10-CM

## 2020-11-06 DIAGNOSIS — I69391 Dysphagia following cerebral infarction: Secondary | ICD-10-CM

## 2020-11-06 DIAGNOSIS — R739 Hyperglycemia, unspecified: Secondary | ICD-10-CM

## 2020-11-06 DIAGNOSIS — G8191 Hemiplegia, unspecified affecting right dominant side: Secondary | ICD-10-CM

## 2020-11-06 DIAGNOSIS — I6602 Occlusion and stenosis of left middle cerebral artery: Secondary | ICD-10-CM

## 2020-11-06 DIAGNOSIS — I63412 Cerebral infarction due to embolism of left middle cerebral artery: Secondary | ICD-10-CM

## 2020-11-06 DIAGNOSIS — I10 Essential (primary) hypertension: Secondary | ICD-10-CM

## 2020-11-06 DIAGNOSIS — R339 Retention of urine, unspecified: Secondary | ICD-10-CM

## 2020-11-06 LAB — GLUCOSE, CAPILLARY
Glucose-Capillary: 100 mg/dL — ABNORMAL HIGH (ref 70–99)
Glucose-Capillary: 126 mg/dL — ABNORMAL HIGH (ref 70–99)
Glucose-Capillary: 107 mg/dL — ABNORMAL HIGH (ref 70–99)
Glucose-Capillary: 118 mg/dL — ABNORMAL HIGH (ref 70–99)

## 2020-11-06 MED ORDER — BETHANECHOL CHLORIDE 10 MG PO TABS
10.0000 mg | ORAL_TABLET | Freq: Three times a day (TID) | ORAL | Status: DC
  Administered 2020-11-06 – 2020-11-07 (×2): 10 mg via ORAL
  Filled 2020-11-06 (×2): qty 1

## 2020-11-06 NOTE — Progress Notes (Signed)
PROGRESS NOTE   Subjective/Complaints: Patient seen laying in bed this morning.  Husband at bedside, provides history.  No reported issues overnight.  Husband denies complaints.  ROS: Limited due to cognition  Objective:   No results found. No results for input(s): WBC, HGB, HCT, PLT in the last 72 hours. No results for input(s): NA, K, CL, CO2, GLUCOSE, BUN, CREATININE, CALCIUM in the last 72 hours.  Intake/Output Summary (Last 24 hours) at 11/06/2020 1648 Last data filed at 11/06/2020 0900 Gross per 24 hour  Intake 350 ml  Output 800 ml  Net -450 ml        Physical Exam: Vital Signs Blood pressure (!) 123/57, pulse 70, temperature 97.7 F (36.5 C), resp. rate 18, height 5\' 2"  (1.575 m), weight 63 kg, SpO2 98 %. Constitutional: No distress . Vital signs reviewed. HENT: Normocephalic.  Atraumatic. Eyes: EOMI. No discharge. Cardiovascular: No JVD.  RRR. Respiratory: Normal effort.  No stridor.  Bilateral clear to auscultation. GI: Non-distended.  BS +. Skin: Warm and dry.  Intact. Psych: Limited due to aphasia Musc: No edema in extremities.  No tenderness in extremities. Neuro: Alert Motor: RUE/RLE: Appear to be 0/5 proximal distal LUE/LLE: Grossly 5/5 proximal distal Global aphasia  Assessment/Plan: 1. Functional deficits which require 3+ hours per day of interdisciplinary therapy in a comprehensive inpatient rehab setting.  Physiatrist is providing close team supervision and 24 hour management of active medical problems listed below.  Physiatrist and rehab team continue to assess barriers to discharge/monitor patient progress toward functional and medical goals  Care Tool:  Bathing    Body parts bathed by patient: Chest,Abdomen,Left upper leg,Face   Body parts bathed by helper: Right upper leg,Buttocks,Front perineal area,Left arm,Right arm,Right lower leg,Left lower leg     Bathing assist Assist Level:  Maximal Assistance - Patient 24 - 49%     Upper Body Dressing/Undressing Upper body dressing   What is the patient wearing?: Pull over shirt    Upper body assist Assist Level: Maximal Assistance - Patient 25 - 49%    Lower Body Dressing/Undressing Lower body dressing      What is the patient wearing?: Pants     Lower body assist Assist for lower body dressing: Maximal Assistance - Patient 25 - 49%     Toileting Toileting    Toileting assist Assist for toileting: 2 Helpers     Transfers Chair/bed transfer  Transfers assist     Chair/bed transfer assist level: Maximal Assistance - Patient 25 - 49%     Locomotion Ambulation   Ambulation assist   Ambulation activity did not occur: N/A  Assist level: 2 helpers Assistive device: No Device Max distance: 30   Walk 10 feet activity   Assist  Walk 10 feet activity did not occur: Safety/medical concerns  Assist level: 2 helpers Assistive device: No Device   Walk 50 feet activity   Assist Walk 50 feet with 2 turns activity did not occur: Safety/medical concerns         Walk 150 feet activity   Assist Walk 150 feet activity did not occur: Safety/medical concerns         Walk 10 feet  on uneven surface  activity   Assist Walk 10 feet on uneven surfaces activity did not occur: Safety/medical concerns         Wheelchair     Assist Will patient use wheelchair at discharge?: Yes (Per PT long term goals) Type of Wheelchair: Manual           Wheelchair 50 feet with 2 turns activity    Assist    Wheelchair 50 feet with 2 turns activity did not occur: Safety/medical concerns       Wheelchair 150 feet activity     Assist  Wheelchair 150 feet activity did not occur: Safety/medical concerns       Blood pressure (!) 123/57, pulse 70, temperature 97.7 F (36.5 C), resp. rate 18, height 5\' 2"  (1.575 m), weight 63 kg, SpO2 98 %.    Medical Problem List and Plan: 1.  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  Impaired mobility:  -DVT/anticoagulation: Continue Lovenox, does have bruising             -antiplatelet therapy: Aspirin 81 mg daily and Brilinta 90 mg twice daily 3. Pain Management: Tylenol as needed 4. Mood: Provide emotional support             -antipsychotic agents: N/A 5. Neuropsych: This patient is not capable of making decisions on her own behalf. 6. Skin/Wound Care: Routine skin checks 7. Fluids/Electrolytes/Nutrition: Routine in and outs 8.  Dysphagia.  Dysphagia #1 honey thick liquids.  Nasogastric tube d/ced given improved appetite.   Advance diet as tolerated 9.  Hypertension.  Norvasc decreased to 5 on 3/25, Cozaar 50 mg twice daily.  Monitor with increased mobility  Relatively controlled on 3/27 10.  Seizure prophylaxis.  Keppra 500 mg twice daily.  EEG negative 11.  Hyperlipidemia.  Lipitor 12.  Incidental findings meningioma.  Follow-up outpatient neurosurgery Dr. Venetia Constable 13.  Hyperglycemia related to tube feeds.  SSI  Slightly elevated on 2/27 14. Poor initiation/global aphasia  Daughter wants to research amantadine vs Ritalin prior to initiating medication 15. Urinary retention:  UA negative  Urecholine 5mg  TID, increased to 10 on 3/27  LOS: 5 days A FACE TO FACE EVALUATION WAS PERFORMED  Brittany Griffith Lorie Phenix 11/06/2020, 4:48 PM

## 2020-11-07 LAB — GLUCOSE, CAPILLARY
Glucose-Capillary: 115 mg/dL — ABNORMAL HIGH (ref 70–99)
Glucose-Capillary: 138 mg/dL — ABNORMAL HIGH (ref 70–99)
Glucose-Capillary: 111 mg/dL — ABNORMAL HIGH (ref 70–99)
Glucose-Capillary: 139 mg/dL — ABNORMAL HIGH (ref 70–99)

## 2020-11-07 MED ORDER — BETHANECHOL CHLORIDE 10 MG PO TABS
10.0000 mg | ORAL_TABLET | Freq: Four times a day (QID) | ORAL | Status: DC
Start: 2020-11-07 — End: 2020-11-10
  Administered 2020-11-07 – 2020-11-10 (×12): 10 mg via ORAL
  Filled 2020-11-07 (×12): qty 1

## 2020-11-07 NOTE — Progress Notes (Signed)
Orthopedic Tech Progress Note Patient Details:  Brittany Griffith April 16, 1951 [id number] Called in order to [facility] for an AFO Patient ID: Brittany Griffith, female   DOB: 02-05-1951, 70 y.o.   MRN: [id number]   Brittany Griffith 11/07/2020, 2:02 PM

## 2020-11-07 NOTE — Progress Notes (Signed)
Occupational Therapy Session Note  Patient Details  Name: Brittany Griffith MRN: [id number] Date of Birth: [birth year]  Today's Date: 11/07/2020 OT Individual Time: 1300-1345 OT Individual Time Calculation (min): 45 min    Short Term Goals: Week 1:  OT Short Term Goal 1 (Week 1): Pt will complete UB bathing with mod demonstrational cueing and min assist from supported sitting. OT Short Term Goal 2 (Week 1): Pt will complete LB bathing sit to stand with max assist and mod demonstrational cueing for sequencing. OT Short Term Goal 3 (Week 1): Pt will complete toilet transfer with mod assist squat pivot to drop arm commode. OT Short Term Goal 4 (Week 1): Pt's spouse will return demonstrate safe assist with PROM exercises with the RUE. OT Short Term Goal 5 (Week 1): Pt will maintain static sitting balance with supervision EOB or EOM in preparation for selfcare tasks.  Skilled Therapeutic Interventions/Progress Updates:    Pt greeted semi-reclined in bed with daughter present giving her a bath. OT helped encourage pt to complete some bathing tasks on her own. OT brought pt to sitting EOB with max A. Worked on sitting balance with min/mod A while performing UB bathing tasks. Pt needed hand over hand to initaite bathing different body parts and then OT or daughters assist for thorughness. Si<>stands at EOB with max A and R knee block. OT assisted with standing max A for 1 minute intervals while daughter assisted with peri-care and brief change. Pt then completed squat-pivot to TIS wc with max A and R knee block. Pt brought to the sink in wc for toothbrushing task. OT used suction to remove excess toothpaste from mouth as pt on honey thick liquids and globally aphasic. OT provided pt with shampoo cap and needed hand over hand A to help with scrubbing scalp. Pt transferred back to bed with max A. OT placed SAEBO e-stim on wrist extensors. SAEBO left on for 60 minutes. OT returned to remove SAEBO with skin intact  and no adverse reactions.  [place] Stim One 330 pulse width 35 Hz pulse rate On 8 sec/ off 8 sec Ramp up/ down 2 sec Symmetrical Biphasic wave form  Max intensity 156mA at 500 Ohm load  Pt left semi-reclined in bed at end of session with bed alarm on and daughter present.    Therapy Documentation Precautions:  Precautions Precautions: Fall,Other (comment) Precaution Comments: global aphasia, dense R hemiparesis, R inattention Restrictions Weight Bearing Restrictions: No Pain: Denies pain  Therapy/Group: Individual Therapy  Valma Cava 11/07/2020, 1:47 PM

## 2020-11-07 NOTE — Progress Notes (Signed)
PROGRESS NOTE   Subjective/Complaints:  No issues overnight , pt's daughter interested in discussing meds, neurostimulants for aphasia   ROS: Limited due to cognition  Objective:   No results found. No results for input(s): WBC, HGB, HCT, PLT in the last 72 hours. No results for input(s): NA, K, CL, CO2, GLUCOSE, BUN, CREATININE, CALCIUM in the last 72 hours.  Intake/Output Summary (Last 24 hours) at 11/07/2020 0918 Last data filed at 11/07/2020 0750 Gross per 24 hour  Intake 476 ml  Output 1300 ml  Net -824 ml        Physical Exam: Vital Signs Blood pressure 122/60, pulse 71, temperature 98.2 F (36.8 C), resp. rate 16, height 5\' 2"  (1.575 m), weight 63.8 kg, SpO2 97 %.  General: No acute distress Mood and affect are appropriate Heart: Regular rate and rhythm no rubs murmurs or extra sounds Lungs: Clear to auscultation, breathing unlabored, no rales or wheezes Abdomen: Positive bowel sounds, soft nontender to palpation, nondistended Extremities: No clubbing, cyanosis, or edema Skin: No evidence of breakdown, no evidence of rash Neuro: Alert Motor: RUE/RLE: Appear to be 0/5 proximal distal LUE/LLE: Grossly 5/5 proximal distal Global aphasia  Assessment/Plan: 1. Functional deficits which require 3+ hours per day of interdisciplinary therapy in a comprehensive inpatient rehab setting.  Physiatrist is providing close team supervision and 24 hour management of active medical problems listed below.  Physiatrist and rehab team continue to assess barriers to discharge/monitor patient progress toward functional and medical goals  Care Tool:  Bathing    Body parts bathed by patient: Chest,Abdomen,Left upper leg,Face   Body parts bathed by helper: Right upper leg,Buttocks,Front perineal area,Left arm,Right arm,Right lower leg,Left lower leg     Bathing assist Assist Level: Maximal Assistance - Patient 24 - 49%      Upper Body Dressing/Undressing Upper body dressing   What is the patient wearing?: Pull over shirt    Upper body assist Assist Level: Maximal Assistance - Patient 25 - 49%    Lower Body Dressing/Undressing Lower body dressing      What is the patient wearing?: Pants     Lower body assist Assist for lower body dressing: Maximal Assistance - Patient 25 - 49%     Toileting Toileting    Toileting assist Assist for toileting: 2 Helpers     Transfers Chair/bed transfer  Transfers assist     Chair/bed transfer assist level: Maximal Assistance - Patient 25 - 49%     Locomotion Ambulation   Ambulation assist   Ambulation activity did not occur: N/A  Assist level: 2 helpers Assistive device: No Device Max distance: 30   Walk 10 feet activity   Assist  Walk 10 feet activity did not occur: Safety/medical concerns  Assist level: 2 helpers Assistive device: No Device   Walk 50 feet activity   Assist Walk 50 feet with 2 turns activity did not occur: Safety/medical concerns         Walk 150 feet activity   Assist Walk 150 feet activity did not occur: Safety/medical concerns         Walk 10 feet on uneven surface  activity   Assist Walk 10  feet on uneven surfaces activity did not occur: Safety/medical concerns         Wheelchair     Assist Will patient use wheelchair at discharge?: Yes (Per PT long term goals) Type of Wheelchair: Manual           Wheelchair 50 feet with 2 turns activity    Assist    Wheelchair 50 feet with 2 turns activity did not occur: Safety/medical concerns       Wheelchair 150 feet activity     Assist  Wheelchair 150 feet activity did not occur: Safety/medical concerns       Blood pressure 122/60, pulse 71, temperature 98.2 F (36.8 C), resp. rate 16, height 5\' 2"  (1.575 m), weight 63.8 kg, SpO2 97 %.    Medical Problem List and Plan: 1.  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  Impaired mobility:  -DVT/anticoagulation: Continue Lovenox, does have bruising             -antiplatelet therapy: Aspirin 81 mg daily and Brilinta 90 mg twice daily 3. Pain Management: Tylenol as needed 4. Mood: Provide emotional support             -antipsychotic agents: N/A 5. Neuropsych: This patient is not capable of making decisions on her own behalf. 6. Skin/Wound Care: Routine skin checks 7. Fluids/Electrolytes/Nutrition: Routine in and outs 8.  Dysphagia.  Dysphagia #1 honey thick liquids.  Nasogastric tube d/ced given improved appetite.   Advance diet as tolerated 9.  Hypertension.  Norvasc decreased to 5 on 3/25, Cozaar 50 mg twice daily.  Monitor with increased mobility   Vitals:   11/06/20 1947 11/07/20 0426  BP: (!) 115/53 122/60  Pulse: 73 71  Resp: 19 16  Temp: 98.1 F (36.7 C) 98.2 F (36.8 C)  SpO2: 97% 97%   10.  Seizure prophylaxis.  Keppra 500 mg twice daily.  EEG negative 11.  Hyperlipidemia.  Lipitor 12.  Incidental findings meningioma.  Follow-up outpatient neurosurgery Dr. Venetia Constable 13.  Hyperglycemia related to tube feeds.  SSI  Slightly elevated on 2/27 14. global aphasia  Mainly aphasic but with good level of alertness  15. Urinary retention:  UA negative  Urecholine 5mg  TID, increased to 10 on 3/27, increased to QID 3/28  LOS: 6 days A FACE TO [place] E Latrece Nitta 11/07/2020, 9:18 AM

## 2020-11-07 NOTE — Progress Notes (Signed)
Speech Language Pathology Daily Session Note  Patient Details  Name: Brittany Griffith MRN: [id number] Date of Birth: 11/08/1950  Today's Date: 11/07/2020 SLP Individual Time: 0800-0900 SLP Individual Time Calculation (min): 60 min  Short Term Goals: Week 1: SLP Short Term Goal 1 (Week 1): Pt will demonstrate efficient mastication and oral clearance of current diet Dys 1 textures, honey thick liquids with Max A cues for use of compensatory swallow strategies. SLP Short Term Goal 2 (Week 1): Pt will consume Dys 2 texture trials with minimal right buccal pocketing and no more than Moderate cues required to clear X3 prior to upgrade. SLP Short Term Goal 3 (Week 1): Pt will consume therapeutic trials of ice and or thin H2O via spoon without decline in respiratory status or other vitals across 1 week prior to repeat MBSS. SLP Short Term Goal 4 (Week 1): Pt will voice on command in 25% of opportunities with Max A multimodal cueing. SLP Short Term Goal 5 (Week 1): Pt will imitate oral motor commands with 25% accuracy with Max A multimodal cueing. SLP Short Term Goal 6 (Week 1): Pt will communicate basic wants and needs via gestures and/or communication board with 75% accuracy with Max A multimodal cueing.  Skilled Therapeutic Interventions: Skilled SLP intervention focused on dysphagia and communication. Pt was pleasant throughout session. Husband, daughter and interpretor were present during session. Oral care completed at beginning of session with sponge swab and suctioning. She consumed 3 oz of HTL via tsp. No overt s/sx of aspiration or penetration noted this session. Family educated on MBSS results and silent aspiration. She responded to functional yes/no questions regarding relationship to people in room with attempts to verbalize however no intelligible words. Improvement in consistency with nodding head yes. She gestured with pushing items away for "no" when offered food she did not want. Automatic  speech tasks completed with patient counting 1-5. She attempted verbalizations with speech sounds at very low volume however unintelligible. Educated family on  Simple oral motor commands and vowel sounds to practice with patient outside of therapy. Cont with therapy per plan of care.     Pain Pain Assessment Pain Scale: Faces Pain Score: Asleep Faces Pain Scale: Hurts little more  Therapy/Group: Individual Therapy  Darrol Poke Kimball Manske 11/07/2020, 8:50 AM

## 2020-11-07 NOTE — Progress Notes (Signed)
Physical Therapy Session Note  Patient Details  Name: Brittany Griffith MRN: [id number] Date of Birth: 1950/11/04  Today's Date: 11/07/2020 PT Individual Time: 0915-1015 PT Individual Time Calculation (min): 60 min   Short Term Goals: Week 1:  PT Short Term Goal 1 (Week 1): Pt will consistently perform supine<>sit with max assist of 1 PT Short Term Goal 2 (Week 1): Pt will perform sit<>stands with max assist of 1 using LRAD or +2 HHA PT Short Term Goal 3 (Week 1): Pt will perform bed<>chair transfers with max assist of 1 PT Short Term Goal 4 (Week 1): Pt will participate in pre-gait training with +2 max assist using LRAD  Skilled Therapeutic Interventions/Progress Updates:    Patient received in bed, interpretor and family present, agreeable to PT. She denies pain. Patient able to come sit edge of bed with ModA demonstrating moderate pushing with R UE on bed rail requiring intervention from PT to prevent. MaxA x2 squat pivot to TIS wc. PT transporting patient in wc to therapy gym for time management. MaxA to stand with 2nd person donning LiteGait harness. Patient requiring MaxA/TotalA to advance R LE through gait cycle with 2nd person facilitating weight shift. Patient with poor response to cuing for timing of L step often resulting in both LEs being lifted off treadmill at a time. With increased facilitation of weight shift, patient able to take longer steps with L LE. When R LE could be positioned in trailing limb, patient would develop significant extensor tone requiring TotalA+ from PT to break tone and advance limb. DF wrap applied to assist in managing extensor tone with moderate effects. At end of session, patient remaining up in TIS wc, seatbelt alarm on, call light within reach, family at bedside.   Therapy Documentation Precautions:  Precautions Precautions: Fall,Other (comment) Precaution Comments: global aphasia, dense R hemiparesis, R inattention Restrictions Weight Bearing  Restrictions: No    Therapy/Group: Individual Therapy  Karoline Caldwell, PT, DPT, CBIS  11/07/2020, 7:42 AM

## 2020-11-08 ENCOUNTER — Inpatient Hospital Stay (HOSPITAL_COMMUNITY): Payer: Medicare HMO

## 2020-11-08 LAB — GLUCOSE, CAPILLARY
Glucose-Capillary: 100 mg/dL — ABNORMAL HIGH (ref 70–99)
Glucose-Capillary: 134 mg/dL — ABNORMAL HIGH (ref 70–99)
Glucose-Capillary: 137 mg/dL — ABNORMAL HIGH (ref 70–99)
Glucose-Capillary: 149 mg/dL — ABNORMAL HIGH (ref 70–99)

## 2020-11-08 LAB — CREATININE, SERUM
Creatinine, Ser: 0.58 mg/dL (ref 0.44–1.00)
GFR, Estimated: >60 mL/min (ref >60)

## 2020-11-08 NOTE — Progress Notes (Signed)
Speech Language Pathology Weekly Progress and Session Note  Patient Details  Name: Brittany Griffith MRN: [id number] Date of Birth: March 19, 1951  Beginning of progress report period: November 02, 2020 End of progress report period: November 08, 2020  Today's Date: 11/08/2020 SLP Individual Time: 0803-0900 SLP Individual Time Calculation (min): 57 min  Short Term Goals: Week 1: SLP Short Term Goal 1 (Week 1): Pt will demonstrate efficient mastication and oral clearance of current diet Dys 1 textures, honey thick liquids with Max A cues for use of compensatory swallow strategies. SLP Short Term Goal 1 - Progress (Week 1): Met SLP Short Term Goal 2 (Week 1): Pt will consume Dys 2 texture trials with minimal right buccal pocketing and no more than Moderate cues required to clear X3 prior to upgrade. SLP Short Term Goal 2 - Progress (Week 1): Not met SLP Short Term Goal 3 (Week 1): Pt will consume therapeutic trials of ice and or thin H2O via spoon without decline in respiratory status or other vitals across 1 week prior to repeat MBSS. SLP Short Term Goal 3 - Progress (Week 1): Progressing toward goal SLP Short Term Goal 4 (Week 1): Pt will voice on command in 25% of opportunities with Max A multimodal cueing. SLP Short Term Goal 4 - Progress (Week 1): Progressing toward goal SLP Short Term Goal 5 (Week 1): Pt will imitate oral motor commands with 25% accuracy with Max A multimodal cueing. SLP Short Term Goal 5 - Progress (Week 1): Updated due to goal met SLP Short Term Goal 6 (Week 1): Pt will communicate basic wants and needs via gestures and/or communication board with 75% accuracy with Max A multimodal cueing. SLP Short Term Goal 6 - Progress (Week 1): Progressing toward goal    New Short Term Goals: Week 2: SLP Short Term Goal 1 (Week 2): Pt will consume Dys 2 texture trials with minimal right buccal pocketing and no more than Moderate cues required to clear X3 prior to upgrade. SLP Short Term Goal  2 (Week 2): Pt will consume therapeutic trials of ice and or thin H2O via spoon without decline in respiratory status or other vitals across 1 week prior to repeat MBSS. SLP Short Term Goal 3 (Week 2): Pt will voice on command in 25% of opportunities with Max A multimodal cueing. SLP Short Term Goal 4 (Week 2): Pt will imitate oral motor commands with 50% accuracy with Max A multimodal cueing. SLP Short Term Goal 5 (Week 2): Pt will communicate basic wants and needs via gestures and/or communication board with 75% accuracy with Max A multimodal cueing.  Weekly Progress Updates: Pt has made slow but consistent gains and has met 2of 6 short term goals this reporting period due to improved tolerance with current diet (pureed with HTL),increased consistency with imitating oral motor commands. She is continuing to improve voicing with simple vowle sounds however productions are inconsistent. Currently, pt continues to require Max A multimodal cueing to gesture wants/needs, max A for imitating oral motor commands, and voicing on command. She is on pureed diet and HTL and demonstrating efficient mastication and  Oral clearance. Pt and family education is ongoing. Pt would benefit from continued SLP intervention to improve swallow function and verbal communication of wants/needs in order to maximize her functional independence prior to discharge.      Intensity: Minumum of 1-2 x/day, 30 to 90 minutes Frequency: 3 to 5 out of 7 days Duration/Length of Stay: 3.5-4 weeks Treatment/Interventions: Cognitive remediation/compensation;Cueing hierarchy;Dysphagia/aspiration precaution  training;Functional tasks;Patient/family education;Therapeutic Activities;Speech/Language facilitation;Multimodal communication approach;Internal/external aids   Daily Session  Skilled Therapeutic Interventions: Skilled SLP intervention focused on dysphagia and communication. Pt was very tired this session with overall decreased  attempts at verbal communication or gestures to communicate. She initially refused po trials with SLP but agreeable to thickened hot tea. She consumed 4 oz of HTL tea by tsp fed by slp. Gesture for yes and pointing to items on communication board was less consistent this session. She consistently followed commands to open mouth, smile, close eyes. Pt did not follow commands for touching body parts on face and needed max a tactile cues for  counting 1-3 with fingers. Husband reports the past few nights patient had pain in L hip and stated she gestures to indicate pain by tapping hip using her hand. Recommend continued SLP intervention to increase verbal communication of wants/needs and to improve swallow function.    General    Pain Pain Assessment Pain Scale: 0-10 Faces Pain Scale: Hurts little more Pain Location: Hip Pain Orientation: Left  Therapy/Group: Individual Therapy  Darrol Poke Jie Stickels 11/08/2020, 7:56 AM

## 2020-11-08 NOTE — Progress Notes (Signed)
Patient/family education was started to show family how to straight cath patient at home. Husband and daughter verbalize the steps. Education pamphlets was given to both parties.

## 2020-11-08 NOTE — Progress Notes (Signed)
Nutrition Follow-up  DOCUMENTATION CODES:   Not applicable  INTERVENTION:   - Magic Cup TID with meals, each supplement provides 290 kcal and 9 grams of protein  - Double protein portions TID with meals  - Encourage continued adequate PO intake  NUTRITION DIAGNOSIS:   Increased nutrient needs related to other (extensive therapies) as evidenced by estimated needs.  Ongoing  GOAL:   Patient will meet greater than or equal to 90% of their needs  Progressing  MONITOR:   PO intake,Supplement acceptance,Diet advancement,Weight trends,Labs  REASON FOR ASSESSMENT:   Consult Calorie Count,Enteral/tube feeding initiation and management,Assessment of nutrition requirement/status  ASSESSMENT:   70 year old female with PMH of HLD and HTN. Presented 10/23/20 with right-sided weakness, headache, and slurred speech. 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 Pt underwent cerebral angiogram 10/26/20 followed by stent assisted angioplasty of left MCA with revascularization per IR. Pt currently on a dysphagia 1 diet with honey thick liquids and Cortrak in place for nutrition support. Admitted to CIR on 11/01/20.  3/24 - Cortrak removed  Pt unavailable at time of RD visit. PO intake remains excellent with 75-100% meal completions noted. Weight remains stable. RD to order double protein portions TID with meals in addition to [employer] which have already been ordered.  Admit weight: 61.1 kg Current weight: 63.1 kg  Meal Completion: 75-100%  Medications reviewed and include: SSI, senna  Labs reviewed. CBG's: 100-139 x 24 hours  UOP: 1375 ml x 24 hours  Diet Order:   Diet Order            DIET - DYS 1 Room service appropriate? No; Fluid consistency: Honey Thick  Diet effective now                 EDUCATION NEEDS:   No education needs have been identified at this time  Skin:  Skin Assessment: Reviewed RN Assessment  Last BM:  11/05/20  Height:   Ht Readings from Last 1 Encounters:   11/02/20 5\' 2"  (1.575 m)    Weight:   Wt Readings from Last 1 Encounters:  11/08/20 63.1 kg    BMI:  Body mass index is 25.44 kg/m.  Estimated Nutritional Needs:   Kcal:  1700-1900  Protein:  85-100 grams  Fluid:  1.7-1.9 L    [name], MS, RD, LDN Inpatient Clinical Dietitian Please see [facility] for contact information.

## 2020-11-08 NOTE — Progress Notes (Signed)
Physical Therapy Session Note  Patient Details  Name: Brittany Griffith MRN: [id number] Date of Birth: Dec 30, 1950  Today's Date: 11/08/2020 PT Individual Time: 1300-1410 PT Individual Time Calculation (min): 70 min   Short Term Goals: Week 1:  PT Short Term Goal 1 (Week 1): Pt will consistently perform supine<>sit with max assist of 1 PT Short Term Goal 2 (Week 1): Pt will perform sit<>stands with max assist of 1 using LRAD or +2 HHA PT Short Term Goal 3 (Week 1): Pt will perform bed<>chair transfers with max assist of 1 PT Short Term Goal 4 (Week 1): Pt will participate in pre-gait training with +2 max assist using LRAD  Skilled Therapeutic Interventions/Progress Updates:    Patient received supine in bed, agreeable to PT. Interpretor present. At onset of session, patient denied pain. She was able to communicate to PT that she had to go to the bathroom by patting her brief. She came to sit edge of bed with MinA and verbal cues for sequencing. Stedy used to transfer to toilet for efficiency with MinA to come to stand. Moderate R lateral lean noted in [place] requiring consistent CGA/MinA from therapist to maintain appropriate balance. Patient unable to void once on toilet. MaxA for clothing management in standing. Patient transferring to [place] via Stedy. PT transporting patient in wc to therapy gym for time management and energy conservation. Orthotist present per order for AFO, however, patient not at stage where AFO is appropriate yet given the level of assist she currently requires and fluctuating initiation of R LE advancement. Order likely meant to be for [facility], which she would benefit from to help prevent development of contractures. Patient ambulated ~44ft with MaxAx2 HHA and ModA/MaxA for placement/advancement of R LE. With fatigue, patient demonstrating increased pushing to the R with L LE. Dynamic standing balance task completed with mirror for visual feedback, intermittent R knee blocking and  ModA for postural support. Patient began to wince when coming to stand and indicated that her back was beginning to bother her. In standing, R reaching task completed requiring Mod verbal cues to attend to R visual field to retrieve object from PT. Moderate weight shift R noted with good weight shift left to hand object to student therapist using L UE. PT facilitating further weight shift R with minimal pushing/resistance from patient noted. Patient requiring multiple seated rest breaks due to fatigue and back pain. Lower TIS wc was obtained, which will make transfers and sit <>stand transitions safer and easier. Patient ambulating x36ft with MaxA x2 HHA and demonstrated less pushing and greater ability to advance/place R LE. Patient frequently reaching to hold onto something with LUE for further stability despite being offered student therapists hand. Trialed gait with RW + modified hand grip for R UE with slight improvement in postural stability, however she did demonstrate some difficulty coordinating steps with advancement of RW. Patient returning to her room in wc, transferring to bed via Stedy due to fatigue. Patient able to make some sounds in attempts to communicate with therapist and appeared to understand conversation as she would smile and nod appropriately. Patient remaining in bed, bed alarm on, call light within reach.   Therapy Documentation Precautions:  Precautions Precautions: Fall,Other (comment) Precaution Comments: global aphasia, dense R hemiparesis, R inattention Restrictions Weight Bearing Restrictions: No    Therapy/Group: Individual Therapy  Karoline Caldwell, PT, DPT, CBIS  11/08/2020, 7:41 AM

## 2020-11-08 NOTE — Progress Notes (Signed)
Occupational Therapy Session Note  Patient Details  Name: Josue [place] MRN: [id number] Date of Birth: 08/10/1951  Today's Date: 11/08/2020 OT Individual Time: 0930-1005 OT Individual Time Calculation (min): 35 min  (60 min of unattended estim)  Short Term Goals: Week 1:  OT Short Term Goal 1 (Week 1): Pt will complete UB bathing with mod demonstrational cueing and min assist from supported sitting. OT Short Term Goal 2 (Week 1): Pt will complete LB bathing sit to stand with max assist and mod demonstrational cueing for sequencing. OT Short Term Goal 3 (Week 1): Pt will complete toilet transfer with mod assist squat pivot to drop arm commode. OT Short Term Goal 4 (Week 1): Pt's spouse will return demonstrate safe assist with PROM exercises with the RUE. OT Short Term Goal 5 (Week 1): Pt will maintain static sitting balance with supervision EOB or EOM in preparation for selfcare tasks.  Skilled Therapeutic Interventions/Progress Updates:    Pt seen this session to focus on balance, R side awareness, and initiation with working on bathing and dressing EOB. Spent time working on rolling to L and pushing up to sit with mod A.  Initially needed max for balance but through out session progressed to CGA and for 1-2 min, pt could sit with close S and no UE support.   Sit to stand with mod A and sq pivot to R with mod A.  Pt continues to be non verbal.   Spouse and interpretor present. Pt in tIS wc with belt alarm on and all needs met.  Placed Saebo Unit on R deltoid for unattended cyclic stimulation to facilitate deltoids.  Unit removed 1 hour later with no adverse reaction.   Saebo Stim One 330 pulse width 35 Hz pulse rate On 8 sec/ off 8 sec Ramp up/ down 2 sec Symmetrical Biphasic wave form  Max intensity 110mA at 500 Ohm load  Therapy Documentation Precautions:  Precautions Precautions: Fall,Other (comment) Precaution Comments: global aphasia, dense R hemiparesis, R  inattention Restrictions Weight Bearing Restrictions: No  Pain: Pain Assessment Pain Score: 0-No pain ADL: ADL Eating: NPO Grooming: Maximal assistance Where Assessed-Grooming: Wheelchair Upper Body Bathing: Maximal assistance Where Assessed-Upper Body Bathing: Wheelchair Lower Body Bathing: Dependent Where Assessed-Lower Body Bathing: Sitting at sink,Standing at sink,Wheelchair Upper Body Dressing: Dependent Where Assessed-Upper Body Dressing: Wheelchair Lower Body Dressing: Dependent Where Assessed-Lower Body Dressing: Sitting at sink,Standing at sink,Wheelchair Toileting: Dependent Where Assessed-Toileting: Bed level Toilet Transfer: Dependent Toilet Transfer Method: Squat pivot Toilet Transfer Equipment: Drop arm bedside commode Tub/Shower Transfer: Not assessed Walk-In Shower Transfer: Not assessed      Therapy/Group: Individual Therapy  SAGUIER,JULIA 11/08/2020, 12:23 PM 

## 2020-11-08 NOTE — Progress Notes (Signed)
PROGRESS NOTE   Subjective/Complaints:  Per husband pt points to Left hip pain, using Tiger Balm no falls, had some similar issues PTA   ROS: Limited due to cognition  Objective:   No results found. No results for input(s): WBC, HGB, HCT, PLT in the last 72 hours. Recent Labs    11/08/20 0504  CREATININE 0.58    Intake/Output Summary (Last 24 hours) at 11/08/2020 0810 Last data filed at 11/08/2020 0615 Gross per 24 hour  Intake 120 ml  Output 1375 ml  Net -1255 ml        Physical Exam: Vital Signs Blood pressure (!) 119/56, pulse 67, temperature 97.6 F (36.4 C), temperature source Oral, resp. rate 16, height 5\' 2"  (1.575 m), weight 63.1 kg, SpO2 95 %.  General: No acute distress Mood and affect are appropriate Heart: Regular rate and rhythm no rubs murmurs or extra sounds Lungs: Clear to auscultation, breathing unlabored, no rales or wheezes Abdomen: Positive bowel sounds, soft nontender to palpation, nondistended Extremities: No clubbing, cyanosis, or edema Skin: No evidence of breakdown, no evidence of rash, No skin areas Left buttocks  MSK- no pain with Left hip ROM , pain with palp L glut,  Motor: RUE/RLE: Appear to be 0/5 proximal distal LUE/LLE: Grossly 5/5 proximal distal Sensation grimaces with R 5th digit nailbed pinch  Global aphasia  Assessment/Plan: 1. Functional deficits which require 3+ hours per day of interdisciplinary therapy in a comprehensive inpatient rehab setting.  Physiatrist is providing close team supervision and 24 hour management of active medical problems listed below.  Physiatrist and rehab team continue to assess barriers to discharge/monitor patient progress toward functional and medical goals  Care Tool:  Bathing    Body parts bathed by patient: Chest,Abdomen,Left upper leg,Face   Body parts bathed by helper: Right upper leg,Buttocks,Front perineal area,Left arm,Right  arm,Right lower leg,Left lower leg     Bathing assist Assist Level: Maximal Assistance - Patient 24 - 49%     Upper Body Dressing/Undressing Upper body dressing   What is the patient wearing?: Pull over shirt    Upper body assist Assist Level: Maximal Assistance - Patient 25 - 49%    Lower Body Dressing/Undressing Lower body dressing      What is the patient wearing?: Pants     Lower body assist Assist for lower body dressing: Maximal Assistance - Patient 25 - 49%     Toileting Toileting    Toileting assist Assist for toileting: 2 Helpers     Transfers Chair/bed transfer  Transfers assist     Chair/bed transfer assist level: 2 Helpers     Locomotion Ambulation   Ambulation assist   Ambulation activity did not occur: N/A  Assist level: Total Assistance - Patient < 25% Assistive device: Lite Gait Max distance: 161   Walk 10 feet activity   Assist  Walk 10 feet activity did not occur: Safety/medical concerns  Assist level: Total Assistance - Patient < 25% Assistive device: Lite Gait   Walk 50 feet activity   Assist Walk 50 feet with 2 turns activity did not occur: Safety/medical concerns  Assist level: Total Assistance - Patient < 25% Assistive device:  Lite Gait    Walk 150 feet activity   Assist Walk 150 feet activity did not occur: Safety/medical concerns  Assist level: Total Assistance - Patient < 25% Assistive device: Lite Gait    Walk 10 feet on uneven surface  activity   Assist Walk 10 feet on uneven surfaces activity did not occur: Safety/medical concerns         Wheelchair     Assist Will patient use wheelchair at discharge?: Yes (Per PT long term goals) Type of Wheelchair: Manual           Wheelchair 50 feet with 2 turns activity    Assist    Wheelchair 50 feet with 2 turns activity did not occur: Safety/medical concerns       Wheelchair 150 feet activity     Assist  Wheelchair 150 feet activity  did not occur: Safety/medical concerns       Blood pressure (!) 119/56, pulse 67, temperature 97.6 F (36.4 C), temperature source Oral, resp. rate 16, height 5\' 2"  (1.575 m), weight 63.1 kg, SpO2 95 %.    Medical Problem List and Plan: 1.  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  Impaired mobility:  -DVT/anticoagulation: Continue Lovenox, does have bruising             -antiplatelet therapy: Aspirin 81 mg daily and Brilinta 90 mg twice daily 3. Pain Management: Tylenol as needed 4. Mood: Provide emotional support             -antipsychotic agents: N/A 5. Neuropsych: This patient is not capable of making decisions on her own behalf. 6. Skin/Wound Care: Routine skin checks 7. Fluids/Electrolytes/Nutrition: Routine in and outs 8.  Dysphagia.  Dysphagia #1 honey thick liquids.  Nasogastric tube d/ced given improved appetite.   Advance diet as tolerated 9.  Hypertension.  Norvasc decreased to 5 on 3/25, Cozaar 50 mg twice daily.  Monitor with increased mobility   Vitals:   11/07/20 1946 11/08/20 0601  BP: (!) 124/49 (!) 119/56  Pulse: 78 67  Resp: 17 16  Temp: 98.2 F (36.8 C) 97.6 F (36.4 C)  SpO2: 98% 95%   10.  Seizure prophylaxis.  Keppra 500 mg twice daily.  EEG negative 11.  Hyperlipidemia.  Lipitor 12.  Incidental findings meningioma.  Follow-up outpatient neurosurgery Dr. Venetia Constable 13.  Hyperglycemia related to tube feeds.  SSI  Slightly elevated on 2/27 14. global aphasia  Mainly aphasic but with good level of alertness  15. Urinary retention:  UA negative  Urecholine 5mg  TID, increased to 10 on 3/27, increased to QID 3/28  LOS: 7 days A FACE TO [place] E Oscar Forman 11/08/2020, 8:10 AM

## 2020-11-09 LAB — GLUCOSE, CAPILLARY
Glucose-Capillary: 93 mg/dL (ref 70–99)
Glucose-Capillary: 136 mg/dL — ABNORMAL HIGH (ref 70–99)
Glucose-Capillary: 112 mg/dL — ABNORMAL HIGH (ref 70–99)
Glucose-Capillary: 129 mg/dL — ABNORMAL HIGH (ref 70–99)

## 2020-11-09 MED ORDER — TAMSULOSIN HCL 0.4 MG PO CAPS
0.4000 mg | ORAL_CAPSULE | Freq: Every day | ORAL | Status: DC
  Administered 2020-11-10 – 2020-11-20 (×11): 0.4 mg via ORAL
  Filled 2020-11-09 (×11): qty 1

## 2020-11-09 NOTE — Progress Notes (Signed)
Occupational Therapy Weekly Progress Note  Patient Details  Name: Brittany Griffith MRN: [id number] Date of Birth: [birth year]  Beginning of progress report period: November 02, 2020 End of progress report period: November 09, 2020  Today's Date: 11/09/2020 OT Individual Time: 8527-7824 OT Individual Time Calculation (min): 35 min    Patient has met 4 of 5 short term goals.  Pt is making good progress towards OT goals. Pt continues to demo R neglect and R hemiplegia impacting participation in BADLs. Pt is MIN-mod A for UB dressing, mod-max A for LB dressing, MOD A for functional transfers, and min A for grooming at EOB. Pt would benefit from skilled OT to address the following deficits and educate caregivers.  Patient continues to demonstrate the following deficits: muscle weakness, decreased cardiorespiratoy endurance, impaired timing and sequencing, abnormal tone, unbalanced muscle activation and decreased coordination, right side neglect, decreased initiation, decreased attention, decreased awareness, decreased problem solving, decreased safety awareness, decreased memory and delayed processing and decreased sitting balance, decreased standing balance, decreased postural control, hemiplegia and decreased balance strategies and therefore will continue to benefit from skilled OT intervention to enhance overall performance with BADL and iADL.  Patient progressing toward long term goals..  Continue plan of care.  OT Short Term Goals Week 1:  OT Short Term Goal 1 (Week 1): Pt will complete UB bathing with mod demonstrational cueing and min assist from supported sitting. OT Short Term Goal 1 - Progress (Week 1): Met OT Short Term Goal 2 (Week 1): Pt will complete LB bathing sit to stand with max assist and mod demonstrational cueing for sequencing. OT Short Term Goal 2 - Progress (Week 1): Met OT Short Term Goal 3 (Week 1): Pt will complete toilet transfer with mod assist squat pivot to drop arm  commode. OT Short Term Goal 3 - Progress (Week 1): Met OT Short Term Goal 4 (Week 1): Pt's spouse will return demonstrate safe assist with PROM exercises with the RUE. OT Short Term Goal 4 - Progress (Week 1): Progressing toward goal OT Short Term Goal 5 (Week 1): Pt will maintain static sitting balance with supervision EOB or EOM in preparation for selfcare tasks. OT Short Term Goal 5 - Progress (Week 1): Met Week 2:  OT Short Term Goal 1 (Week 2): Pt husband will demo PROM techniques OT Short Term Goal 2 (Week 2): Pt will thread LUE first into shirt wiht max multimodal cuing OT Short Term Goal 3 (Week 2): pt will thread LLE with CGA fo sitting balance only when donning pants OT Short Term Goal 4 (Week 2): Pt will compelte toilet transfer wiht MOD A stand pivot  Skilled Therapeutic Interventions/Progress Updates:    1:1. Pt mised 10 min skilled OT at beginning of sesion d/t scheduling conflict. Will attempt to make up at later time . Pt completes sup>sitting EOB with MOD A for trunk elevation/scooting. Interpreter present throughotu session as well as family member. Pt completes LB dressing at sit to stand with MOD A for sit to stand and MAX A to doff/don pants with multimodal cuing to follow hemi strategies. HOH A uses throughout d/t decreased initiation v understanding of cues. Pt dons shirt with MIN HOH A to lift RUE with LUE and thread R sleeve first. Pt grooms at sink seated with everything placed on R and HOH A to cross midline with LUE To R to grab items. Saebo donned on R shoulder with good contraction for 60 min stim skin in tact when obtained  60 min later,  Saebo Stim One 330 pulse width 35 Hz pulse rate On 8 sec/ off 8 sec Ramp up/ down 2 sec Symmetrical Biphasic wave form  Max intensity 1108m at 500 Ohm load   Therapy Documentation Precautions:  Precautions Precautions: Fall,Other (comment) Precaution Comments: global aphasia, dense R hemiparesis, R  inattention Restrictions Weight Bearing Restrictions: No General:   Vital Signs: Therapy Vitals Temp: 97.6 F (36.4 C) Pulse Rate: 63 Resp: 20 BP: (!) 122/41 Patient Position (if appropriate): Lying Oxygen Therapy SpO2: 98 % O2 Device: Room Air Pain:   ADL: ADL Eating: NPO Grooming: Maximal assistance Where Assessed-Grooming: Wheelchair Upper Body Bathing: Maximal assistance Where Assessed-Upper Body Bathing: Wheelchair Lower Body Bathing: Dependent Where Assessed-Lower Body Bathing: Sitting at sink,Standing at sink,Wheelchair Upper Body Dressing: Dependent Where Assessed-Upper Body Dressing: Wheelchair Lower Body Dressing: Dependent Where Assessed-Lower Body Dressing: Sitting at sink,Standing at sink,Wheelchair Toileting: Dependent Where Assessed-Toileting: Bed level Toilet Transfer: Dependent Toilet Transfer Method: SEngineer, water Drop arm bedside commode Tub/Shower Transfer: Not assessed WSocial research officer, government Not assessed Vision   Perception    Praxis   Exercises:   Other Treatments:     Therapy/Group: Individual Therapy  STonny Branch3/30/2022, 6:53 AM

## 2020-11-09 NOTE — Progress Notes (Signed)
Speech Language Pathology Daily Session Note  Patient Details  Name: Brittany Griffith MRN: [id number] Date of Birth: 09-14-1950  Today's Date: 11/09/2020 SLP Individual Time: 2993-7169 SLP Individual Time Calculation (min): 25 min  Short Term Goals: Week 2: SLP Short Term Goal 1 (Week 2): Pt will consume Dys 2 texture trials with minimal right buccal pocketing and no more than Moderate cues required to clear X3 prior to upgrade. SLP Short Term Goal 2 (Week 2): Pt will consume therapeutic trials of ice and or thin H2O via spoon without decline in respiratory status or other vitals across 1 week prior to repeat MBSS. SLP Short Term Goal 3 (Week 2): Pt will voice on command in 25% of opportunities with Max A multimodal cueing. SLP Short Term Goal 4 (Week 2): Pt will imitate oral motor commands with 50% accuracy with Max A multimodal cueing. SLP Short Term Goal 5 (Week 2): Pt will communicate basic wants and needs via gestures and/or communication board with 75% accuracy with Max A multimodal cueing.  Skilled Therapeutic Interventions: Pt was seen for skilled ST targeting dysphagia and communication goals. Pt's husband and [place] interpretor present for session. SLP provided upgraded dys 2 (minced/ground) texture snack trial today to assess carryover of swallow strategies and potential for advancement. Pt demonstrated improved overall manipulation and mastication of boluses, and very minimal right buccal pocketing today, with only Min A verbal cues for use of strategies to clear. No overt s/sx aspiration observed throughout session with Dys 2 or honey thick liquids. She is making good progress toward advancement, but would recommend 1-2 more additional trials with SLP to gauge consistency prior to advancement. Although no voicing or verbalizations successfully elicited today, she imitated oral motor movements with Moderate multimodal cueing and responsed to yes/no questions with 90% accuracy via head shakes  and nods during session. Overall, her cognitive processing appeared to be more timely and she presented with brighter affect in comparison to previous session with this ST. Pt left sitting in wheelchair with seatbelt alarm in place, needs within reach, husband and interpretor still present. Continue per current plan of care.        Pain Pain Assessment Pain Scale: Faces Faces Pain Scale: No hurt  Therapy/Group: Individual Therapy  Arbutus Leas 11/09/2020, 7:19 AM

## 2020-11-09 NOTE — Progress Notes (Signed)
PROGRESS NOTE   Subjective/Complaints:  Hip doing better , discussed hip xray   ROS: Limited due to cognition  Objective:   DG Pelvis 1-2 Views  Result Date: 11/08/2020 CLINICAL DATA:  Chronic left hip pain. EXAM: PELVIS - 1-2 VIEW COMPARISON:  None. FINDINGS: There is no evidence of pelvic fracture or diastasis. No pelvic bone lesions are seen. IMPRESSION: Negative. Electronically Signed   By: Marijo Conception M.D.   On: 11/08/2020 12:32   No results for input(s): WBC, HGB, HCT, PLT in the last 72 hours. Recent Labs    11/08/20 0504  CREATININE 0.58    Intake/Output Summary (Last 24 hours) at 11/09/2020 0946 Last data filed at 11/09/2020 0700 Gross per 24 hour  Intake 470 ml  Output 950 ml  Net -480 ml        Physical Exam: Vital Signs Blood pressure (!) 122/41, pulse 63, temperature 97.6 F (36.4 C), resp. rate 20, height 5' 2"  (1.575 m), weight 63.1 kg, SpO2 98 %.  General: No acute distress Mood and affect are appropriate Heart: Regular rate and rhythm no rubs murmurs or extra sounds Lungs: Clear to auscultation, breathing unlabored, no rales or wheezes Abdomen: Positive bowel sounds, soft nontender to palpation, nondistended Extremities: No clubbing, cyanosis, or edema  Skin: No evidence of breakdown, no evidence of rash, No skin areas Left buttocks  MSK- no pain with Left hip ROM , pain with palp L glut,  Motor: RUE/RLE: Appear to be 0/5 proximal distal LUE/LLE: Grossly 5/5 proximal distal Sensation grimaces with R 5th digit nailbed pinch  Global aphasia  Assessment/Plan: 1. Functional deficits which require 3+ hours per day of interdisciplinary therapy in a comprehensive inpatient rehab setting.  Physiatrist is providing close team supervision and 24 hour management of active medical problems listed below.  Physiatrist and rehab team continue to assess barriers to discharge/monitor patient progress  toward functional and medical goals  Care Tool:  Bathing    Body parts bathed by patient: Chest,Abdomen,Left upper leg,Face,Right upper leg,Right arm   Body parts bathed by helper: Left arm,Front perineal area,Buttocks,Left lower leg,Right lower leg     Bathing assist Assist Level: Moderate Assistance - Patient 50 - 74%     Upper Body Dressing/Undressing Upper body dressing   What is the patient wearing?: Pull over shirt    Upper body assist Assist Level: Maximal Assistance - Patient 25 - 49%    Lower Body Dressing/Undressing Lower body dressing      What is the patient wearing?: Pants     Lower body assist Assist for lower body dressing: Maximal Assistance - Patient 25 - 49%     Toileting Toileting    Toileting assist Assist for toileting: 2 Helpers     Transfers Chair/bed transfer  Transfers assist     Chair/bed transfer assist level: Dependent - Patient 0%     Locomotion Ambulation   Ambulation assist   Ambulation activity did not occur: N/A  Assist level: 2 helpers Assistive device: Hand held assist Max distance: 15   Walk 10 feet activity   Assist  Walk 10 feet activity did not occur: Safety/medical concerns  Assist level: 2  helpers Assistive device: Hand held assist   Walk 50 feet activity   Assist Walk 50 feet with 2 turns activity did not occur: Safety/medical concerns  Assist level: Total Assistance - Patient < 25% Assistive device: Lite Gait    Walk 150 feet activity   Assist Walk 150 feet activity did not occur: Safety/medical concerns  Assist level: Total Assistance - Patient < 25% Assistive device: Lite Gait    Walk 10 feet on uneven surface  activity   Assist Walk 10 feet on uneven surfaces activity did not occur: Safety/medical concerns         Wheelchair     Assist Will patient use wheelchair at discharge?: Yes (Per PT long term goals) Type of Wheelchair: Manual           Wheelchair 50 feet with  2 turns activity    Assist    Wheelchair 50 feet with 2 turns activity did not occur: Safety/medical concerns       Wheelchair 150 feet activity     Assist  Wheelchair 150 feet activity did not occur: Safety/medical concerns       Blood pressure (!) 122/41, pulse 63, temperature 97.6 F (36.4 C), resp. rate 20, height 5' 2"  (1.575 m), weight 63.1 kg, SpO2 98 %.    Medical Problem List and Plan: 1.  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 Formulized individual treatment plan based on medical history, underlying problem and comorbidities.  2.  Impaired mobility:  -DVT/anticoagulation: Continue Lovenox, does have bruising             -antiplatelet therapy: Aspirin 81 mg daily and Brilinta 90 mg twice daily 3. Pain Management: Tylenol as needed 4. Mood: Provide emotional support             -antipsychotic agents: N/A 5. Neuropsych: This patient is not capable of making decisions on her own behalf. 6. Skin/Wound Care: Routine skin checks 7. Fluids/Electrolytes/Nutrition: Routine in and outs 8.  Dysphagia.  Dysphagia #1 honey thick liquids.  Nasogastric tube d/ced given improved appetite.   Advance diet as tolerated 9.  Hypertension.  Norvasc decreased to 5 on 3/25, Cozaar 50 mg twice daily.  Monitor with increased mobility   Vitals:   11/08/20 1929 11/09/20 0412  BP: (!) 115/50 (!) 122/41  Pulse: 73 63  Resp: 16 20  Temp: 97.7 F (36.5 C) 97.6 F (36.4 C)  SpO2: 98% 98%  controlled 3/30 10.  Seizure prophylaxis.  Keppra 500 mg twice daily.  EEG negative 11.  Hyperlipidemia.  Lipitor 12.  Incidental findings meningioma.  Follow-up outpatient neurosurgery Dr. Venetia Constable 13.  Hyperglycemia related to  tube feeds.  SSI  Slightly elevated on 2/27 14. global aphasia  Mainly aphasic but with good level of alertness  15. Urinary retention:  UA negative  Urecholine 78m TID, increased to 10 on 3/27, increased to QID 3/28 Add flomax- monitor BP  LOS: 8 days A FACE TO FACE EVALUATION WAS PERFORMED  ACharlett Blake3/30/2022, 9:46 AM

## 2020-11-09 NOTE — Plan of Care (Signed)
  Problem: RH BOWEL ELIMINATION Goal: RH STG MANAGE BOWEL WITH ASSISTANCE Description: STG Manage Bowel with mod I Assistance. Outcome: Not Progressing   Problem: RH BLADDER ELIMINATION Goal: RH STG MANAGE BLADDER WITH ASSISTANCE Description: STG Manage Bladder With  mod I Assistance Outcome: Not Progressing

## 2020-11-09 NOTE — Progress Notes (Signed)
Physical Therapy Weekly Progress Note  Patient Details  Name: Brittany Griffith MRN: [id number] Date of Birth: 1951-03-25  Beginning of progress report period: November 02, 2020 End of progress report period: November 09, 2020  Today's Date: 11/09/2020 PT Individual Time: 1300-1340 PT Individual Time Calculation (min): 40 min  and Today's Date: 11/09/2020 PT Missed Time: 35 Minutes Missed Time Reason: Pain;Patient unwilling to participate  Patient has met 4 of 4 short term goals.  Patient is making slow, but steady progress toward her goals. She is grossly MaxA and is limited by significant R inattention/L gaze preference, R hemiplegia (UE>LE), poor coordination/motor planning, L hip pain. She has been able to participate in gait training overground and on the LiteGait and demonstrates activation and initiation of R LE through gait cycle, but requires 2 assist for safety.   Patient continues to demonstrate the following deficits muscle weakness, decreased cardiorespiratoy endurance, impaired timing and sequencing, abnormal tone, unbalanced muscle activation, decreased coordination and decreased motor planning, decreased midline orientation, decreased attention to right, right side neglect and decreased motor planning, decreased initiation, decreased attention, decreased awareness, decreased problem solving, decreased safety awareness and delayed processing and decreased sitting balance, decreased standing balance, decreased postural control, hemiplegia and decreased balance strategies and therefore will continue to benefit from skilled PT intervention to increase functional independence with mobility.  Patient progressing toward long term goals..  Continue plan of care.  PT Short Term Goals Week 1:  PT Short Term Goal 1 (Week 1): Pt will consistently perform supine<>sit with max assist of 1 PT Short Term Goal 1 - Progress (Week 1): Met PT Short Term Goal 2 (Week 1): Pt will perform sit<>stands with max  assist of 1 using LRAD or +2 HHA PT Short Term Goal 2 - Progress (Week 1): Met PT Short Term Goal 3 (Week 1): Pt will perform bed<>chair transfers with max assist of 1 PT Short Term Goal 3 - Progress (Week 1): Met PT Short Term Goal 4 (Week 1): Pt will participate in pre-gait training with +2 max assist using LRAD PT Short Term Goal 4 - Progress (Week 1): Met Week 2:  PT Short Term Goal 1 (Week 2): Patient will ambulate >73f with MaxA +2 and LRAD PT Short Term Goal 2 (Week 2): Patient will complete bed<>wc transfers with LRAD and ModA x1 PT Short Term Goal 3 (Week 2): Patient will maintain static sitting balance >2 mins with SBA  Skilled Therapeutic Interventions/Progress Updates:    Patient received supine in bed, asleep. She reports pain in her L hip, through a series of questioning. Interpretor present. PT alerted RN and RN provided pain rx. PT provided ice pack to assist with pain management. At this time patient not agreeable to participate in therapy. PT educating patient on importance of OOB therapy and participation in order to progress toward her LTGs and for safe dc home. RN alerting PT that patient refused lunch, and continued to refuse lunch when PT offered to assist. Interpretor, who has been present in all sessions reports that patient appeared to become upset when her husband left for the day. PT providing emotional support and counseling on progress thus far in therapy and attempting to motivate patient to participate in therapy to demonstrate progress to husband. She remained not agreeable to PT. Bed alarm on, call light within reach.   Therapy Documentation Precautions:  Precautions Precautions: Fall,Other (comment) Precaution Comments: global aphasia, dense R hemiparesis, R inattention Restrictions Weight Bearing Restrictions: No   Therapy/Group:  Individual Therapy  Karoline Caldwell, PT, DPT, CBIS  11/09/2020, 7:46 AM

## 2020-11-09 NOTE — Progress Notes (Signed)
Patient ID: Brittany Griffith, female   DOB: [birth year], 70 y.o.   MRN: [id number]   Sw made attempt to provide updated to pt spouse, not in pt room. Will cont to follow up  Erlene Quan, [place]

## 2020-11-09 NOTE — Patient Care Conference (Signed)
Inpatient RehabilitationTeam Conference and Plan of Care Update Date: 11/09/2020   Time: 10:39 AM    Patient Name: Brittany Griffith      Medical Record Number: [id number]  Date of Birth: Aug 16, 1950 Sex: Female         Room/Bed: 4W26C/4W26C-01 Payor Info: Payor: [employer] MEDICARE / Plan: [employer] MEDICARE HMO/PPO / Product Type: *No Product type* /    Admit Date/Time:  11/01/2020  2:44 PM  Primary Diagnosis:  Left middle cerebral artery stroke [facility])  Hospital Problems: Principal Problem:   Left middle cerebral artery stroke [facility]) Active Problems:   left MCA stroke   Meningioma (H[facility])   Cerebral edema ([place])   Global aphasia   Hypertensive urgency   Dysphagia   Right hemiplegia ([name])   Urinary retention   Hyperglycemia   Essential hypertension   Dysphagia, post-stroke    Expected Discharge Date: Expected Discharge Date: 11/24/20  Team Members Present: Physician leading conference: Dr. Alysia Griffith Care Coodinator Present: Brittany Chihuahua, RN, BSN, CRRN;Brittany Griffith, BSW Nurse Present: Brittany Chihuahua, RN PT Present: Brittany Griffith, PT OT Present: Brittany Griffith, OT SLP Present: Brittany Griffith, CF-SLP PPS Coordinator present : Brittany Griffith, Brittany Griffith, PT     Current Status/Progress Goal Weekly Team Focus  Bowel/Bladder   Requiring in/out cath q8h. Family being educated  become continent of bladder  assess PRN and offer timed toileting when appropriate   Swallow/Nutrition/ Hydration   Max A  Min A  Tolerance of dys 2 trials and ice chips. repeat mbss soon.   ADL's   max bathing and dressing from EOB with min to mod for balance, mod sit to stand and sq pivot,  no activity in RUE.  min assist level overall  ADL training, transfer training, RUE NMR with estim, balance, pt/fam ed   Mobility   MinA/ModA bed mobility, ModA sit <>stand HHA, Dependent wc mob in TIS wc, gait up to 153ft MaxA x2 or in LiteGait up to 157ft, improving R inattention and R LE activation  grossly  MinA  R attention, functional transfers, R hemi NMR, bed mobility, gait progressions, wc mob?   Communication   Mod for comprehension Max A for verbal expression  Min A-Mod A  gestures to communicate, yes/no, voicing, and using communication board in room.   Safety/Cognition/ Behavioral Observations  Max A  Min A  attention and alertness. Focus is mainly verbal communication of wants/needs with yes/no pointing and verbal comminication.   Pain   Grimacing with Lt hip pain Tylenol PRN  Pain<3  assess q shift and PRN   Skin   Ecchymosis to arms, abd  no new breakdowns  assess q shift and PRN     Discharge Planning:  Pt discharging home with spouse and son to provide care 24/7daughter returning home. 1 level home 1 step to enter   Team Discussion: Alertness improving after initially sleeping a lot. Urinary retention continues, MD adjusted meds and requires intermittent catheterization.  Activation of right side improved with good coordination of right side.    Patient on target to meet rehab goals: yes, currently mod assist to stand. Max assist +2 ti ambulate. Max assist for bathing and dressing and min-mod assist seated. Back and hip pain limits progress. Silent aspiration on previous MBS with D1 Nectar thick diet. Consistently noting yes/no response, oral motor improvements better and voicing more.  *See Care Plan and progress notes for long and short-term goals.   Revisions to Treatment Plan:  D2 trials and follow  up MBS pending  Teaching Needs: Transfers, toileting, medications, nutrition, intermittent catheterization, etc.   Current Barriers to Discharge: Decreased caregiver support, Home enviroment access/layout and Incontinence  Possible Resolutions to Barriers: Family education     Medical Summary Current Status: severe aphasia aphasia, urinary retention, level of alertness ok, Left hip pain     Possible Resolutions to Barriers/Weekly Focus: Xray hip ok, work on  pocketing, adjust bladder meds   Continued Need for Acute Rehabilitation Level of Care: The patient requires daily medical management by a physician with specialized training in physical medicine and rehabilitation for the following reasons: Direction of a multidisciplinary physical rehabilitation program to maximize functional independence : Yes Medical management of patient stability for increased activity during participation in an intensive rehabilitation regime.: Yes Analysis of laboratory values and/or radiology reports with any subsequent need for medication adjustment and/or medical intervention. : Yes   I attest that I was present, lead the team conference, and concur with the assessment and plan of the team.   Brittany Griffith B 11/09/2020, 2:46 PM

## 2020-11-09 NOTE — Progress Notes (Signed)
Physical Therapy Session Note  Patient Details  Name: Brittany Griffith MRN: [id number] Date of Birth: 01-06-1951  Today's Date: 11/09/2020 PT Individual Time: 0800-0830 PT Individual Time Calculation (min): 30 min   Short Term Goals: Week 1:  PT Short Term Goal 1 (Week 1): Pt will consistently perform supine<>sit with max assist of 1 PT Short Term Goal 2 (Week 1): Pt will perform sit<>stands with max assist of 1 using LRAD or +2 HHA PT Short Term Goal 3 (Week 1): Pt will perform bed<>chair transfers with max assist of 1 PT Short Term Goal 4 (Week 1): Pt will participate in pre-gait training with +2 max assist using LRAD  Skilled Therapeutic Interventions/Progress Updates:  Patient supine in bed upon PT arrival with husband present. Patient alert and agreeable to PT session. Patient with no pain complaint throughout session.  Therapeutic Activity: Bed Mobility: Patient performed supine <> sit with consistent multimodal cueing for sequencing for expected movement. Able to follow instructions and bring BLE to EOB with CGA. TC for bringing LUE to bed rail and Max A to initiate with Mod A to complete to upright sitting position at EOB. Sitting balance maintained with close supervision.  Return to supine at end of session with Mod A and vc for sequencing and technique. Pt is able to follow instructions with multimodal cues using LUe and LLE to assist with positioning toward [facility] requiring Min A from therapist to complete.  Transfers: Patient performed STS with Min/ Mod A with block to R knee. Descent to sit with good control. Provided vc/ tc for knee and hip extension for full upright posturing.   Gait Training:  Patient ambulated 25 feet using RW with Max A for balance, weight shifting, sequencing, and RLE advancement. Ambulated with RLE inattention and impulsive stepping of LLE. Bout included one 180 deg turn and one 90 turn for pivot to bed. Provided multimodal cues throughout for sequence, weight  shift, RLE stepping to lead with bringing knee forward.     Neuromuscular Re-ed: NMR facilitated during session with focus on sitting balance. Pt guided in unsupported sitting balance with no foot support. Pt able to withstand up to moderate perturbations and is able to follow instructions for changing from reactive balance control to proactive balance control in order to attempt to push other objects over. Standing balance challenged with minisquats x5 at EOB with pt requiring assist to RLE and shift toward R in order to engage BLE and orient to midline. NMR performed for improvements in motor control and coordination, balance, sequencing, judgement, and self confidence/ efficacy in performing all aspects of mobility at highest level of independence.   Patient supine in bed at end of session with brakes locked, bed alarm set, and all needs within reach.  Therapy Documentation Precautions:  Precautions Precautions: Fall,Other (comment) Precaution Comments: global aphasia, dense R hemiparesis, R inattention Restrictions Weight Bearing Restrictions: No   Therapy/Group: Individual Therapy  Alger Simons PT, DPT 11/09/2020, 8:31 AM

## 2020-11-10 LAB — GLUCOSE, CAPILLARY
Glucose-Capillary: 102 mg/dL — ABNORMAL HIGH (ref 70–99)
Glucose-Capillary: 126 mg/dL — ABNORMAL HIGH (ref 70–99)
Glucose-Capillary: 103 mg/dL — ABNORMAL HIGH (ref 70–99)
Glucose-Capillary: 130 mg/dL — ABNORMAL HIGH (ref 70–99)

## 2020-11-10 MED ORDER — POLYETHYLENE GLYCOL 3350 17 G PO PACK
17.0000 g | PACK | Freq: Two times a day (BID) | ORAL | Status: DC
  Administered 2020-11-10 – 2020-11-21 (×20): 17 g
  Filled 2020-11-10 (×23): qty 1

## 2020-11-10 MED ORDER — BETHANECHOL CHLORIDE 25 MG PO TABS
25.0000 mg | ORAL_TABLET | Freq: Four times a day (QID) | ORAL | Status: DC
  Administered 2020-11-10 – 2020-11-16 (×24): 25 mg via ORAL
  Filled 2020-11-10 (×24): qty 1

## 2020-11-10 NOTE — Progress Notes (Signed)
Speech Language Pathology Daily Session Note  Patient Details  Name: Brittany Griffith MRN: [id number] Date of Birth: Apr 15, 1951  Today's Date: 11/10/2020 SLP Individual Time: 0830-0925 SLP Individual Time Calculation (min): 55 min  Short Term Goals: Week 2: SLP Short Term Goal 1 (Week 2): Pt will consume Dys 2 texture trials with minimal right buccal pocketing and no more than Moderate cues required to clear X3 prior to upgrade. SLP Short Term Goal 2 (Week 2): Pt will consume therapeutic trials of ice and or thin H2O via spoon without decline in respiratory status or other vitals across 1 week prior to repeat MBSS. SLP Short Term Goal 3 (Week 2): Pt will voice on command in 25% of opportunities with Max A multimodal cueing. SLP Short Term Goal 4 (Week 2): Pt will imitate oral motor commands with 50% accuracy with Max A multimodal cueing. SLP Short Term Goal 5 (Week 2): Pt will communicate basic wants and needs via gestures and/or communication board with 75% accuracy with Max A multimodal cueing.  Skilled Therapeutic Interventions: Pt was seen for skilled ST targeting dysphagia and communication goals. SLP facilitated session by provided Min A with oral care via suction brush prior to trials of ice chips. Pt does have hx of silent aspiration on last MBSS, therefore trials were conservative. Pt consumed 10 ice chips with 1 cough response, which could indicate an increase in sensation, although a repeat MBSS will be required to truly determine that and potential for upgrade. SLP also provided advanced trial of Dys 2 (minced/ground) solid snack, during which very minimal right buccal pocketing noted and was easily cleared at end of intake with 2 presentations of honey thick juice. Her mastication was efficient. Recommend continue current diet but SLP will attempt a trial tray of Dys 2 solids tomorrow to determine readiness for advancement. SLP further facilitated session with opportunities to work on  voicing and verbalizing. Pt produced "mm" and [place] response for "yes" with 50% accuracy today X5. Pt also able to imitate exaggerated inhale and exhale X2, with very quiet voicing on the exhalation in 1 out of 2 attempts. When attempting to elicit volitional cough, she did open her mouth, but no phonation achieved. Also attempted some automatic and sentence completion speech tasks without successful phonation, but some oral motor musculature movement. Pt appeared to become discouraged when attempting to say her name, therefore some skilled education provided to husband regarding importance of practicing and working through frustrations, but also how to determine when practice is no longer productive and taking breaks as well as providing extra processing time. Pt left sitting in bed with alarm set and needs within reach, husband and Nepali interpretor still present. Continue per current plan of care.        Pain Pain Assessment Pain Scale: 0-10 Pain Score: 0-No pain  Therapy/Group: Individual Therapy  Arbutus Leas 11/10/2020, 7:27 AM

## 2020-11-10 NOTE — Progress Notes (Signed)
PROGRESS NOTE   Subjective/Complaints: No issues overnight, still needing caths , also constipated    ROS: Limited due to cognition  Objective:   DG Pelvis 1-2 Views  Result Date: 11/08/2020 CLINICAL DATA:  Chronic left hip pain. EXAM: PELVIS - 1-2 VIEW COMPARISON:  None. FINDINGS: There is no evidence of pelvic fracture or diastasis. No pelvic bone lesions are seen. IMPRESSION: Negative. Electronically Signed   By: Marijo Conception M.D.   On: 11/08/2020 12:32   No results for input(s): WBC, HGB, HCT, PLT in the last 72 hours. Recent Labs    11/08/20 0504  CREATININE 0.58    Intake/Output Summary (Last 24 hours) at 11/10/2020 0851 Last data filed at 11/10/2020 0600 Gross per 24 hour  Intake 365 ml  Output 2850 ml  Net -2485 ml        Physical Exam: Vital Signs Blood pressure 137/70, pulse 66, temperature 98.9 F (37.2 C), resp. rate 19, height 5\' 2"  (1.575 m), weight 63.1 kg, SpO2 97 %.  General: No acute distress Mood and affect are appropriate Heart: Regular rate and rhythm no rubs murmurs or extra sounds Lungs: Clear to auscultation, breathing unlabored, no rales or wheezes Abdomen: Positive bowel sounds, soft nontender to palpation, nondistended Extremities: No clubbing, cyanosis, or edema Skin: No evidence of breakdown, no evidence of rash  Skin: No evidence of breakdown, no evidence of rash, No skin areas Left buttocks  MSK- no pain with Left hip ROM , pain with palp L glut,  Motor: RUE/RLE: Appear to be 0/5 proximal distal LUE/LLE: Grossly 5/5 proximal distal Sensation grimaces with R 5th digit nailbed pinch  Global aphasia  Assessment/Plan: 1. Functional deficits which require 3+ hours per day of interdisciplinary therapy in a comprehensive inpatient rehab setting.  Physiatrist is providing close team supervision and 24 hour management of active medical problems listed below.  Physiatrist and rehab  team continue to assess barriers to discharge/monitor patient progress toward functional and medical goals  Care Tool:  Bathing    Body parts bathed by patient: Chest,Abdomen,Left upper leg,Face,Right upper leg,Right arm   Body parts bathed by helper: Left arm,Front perineal area,Buttocks,Left lower leg,Right lower leg     Bathing assist Assist Level: Moderate Assistance - Patient 50 - 74%     Upper Body Dressing/Undressing Upper body dressing   What is the patient wearing?: Pull over shirt    Upper body assist Assist Level: Maximal Assistance - Patient 25 - 49%    Lower Body Dressing/Undressing Lower body dressing      What is the patient wearing?: Pants     Lower body assist Assist for lower body dressing: Maximal Assistance - Patient 25 - 49%     Toileting Toileting    Toileting assist Assist for toileting: 2 Helpers     Transfers Chair/bed transfer  Transfers assist     Chair/bed transfer assist level: Maximal Assistance - Patient 25 - 49%     Locomotion Ambulation   Ambulation assist   Ambulation activity did not occur: N/A  Assist level: Maximal Assistance - Patient 25 - 49% Assistive device: Walker-rolling Max distance: 25   Walk 10 feet activity  Assist  Walk 10 feet activity did not occur: Safety/medical concerns  Assist level: Maximal Assistance - Patient 25 - 49% Assistive device: Walker-rolling   Walk 50 feet activity   Assist Walk 50 feet with 2 turns activity did not occur: Safety/medical concerns  Assist level: Total Assistance - Patient < 25% Assistive device: Lite Gait    Walk 150 feet activity   Assist Walk 150 feet activity did not occur: Safety/medical concerns  Assist level: Total Assistance - Patient < 25% Assistive device: Lite Gait    Walk 10 feet on uneven surface  activity   Assist Walk 10 feet on uneven surfaces activity did not occur: Safety/medical concerns         Wheelchair     Assist  Will patient use wheelchair at discharge?: Yes (Per PT long term goals) Type of Wheelchair: Manual           Wheelchair 50 feet with 2 turns activity    Assist    Wheelchair 50 feet with 2 turns activity did not occur: Safety/medical concerns       Wheelchair 150 feet activity     Assist  Wheelchair 150 feet activity did not occur: Safety/medical concerns       Blood pressure 137/70, pulse 66, temperature 98.9 F (37.2 C), resp. rate 19, height 5\' 2"  (1.575 m), weight 63.1 kg, SpO2 97 %.    Medical Problem List and Plan: 1.  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  Impaired mobility:  -DVT/anticoagulation: Continue Lovenox, does have bruising             -antiplatelet therapy: Aspirin 81 mg daily and Brilinta 90 mg twice daily 3. Pain Management: Tylenol as needed 4. Mood: Provide emotional support             -antipsychotic agents: N/A 5. Neuropsych: This patient is not capable of making decisions on her own behalf. 6. Skin/Wound Care: Routine skin checks 7. Fluids/Electrolytes/Nutrition: Routine in and outs 8.  Dysphagia.  Dysphagia #1 honey thick liquids.  Nasogastric tube d/ced given improved appetite.   Advance diet as tolerated 9.  Hypertension.  Norvasc decreased to 5 on 3/25, Cozaar 50 mg twice daily.  Monitor with increased mobility   Vitals:   11/09/20 2112 11/10/20 0601  BP: (!) 135/52 137/70  Pulse: 70 66  Resp: 20 19  Temp: 99.1 F (37.3 C) 98.9 F (37.2 C)  SpO2: 97% 97%  controlled 3/31  10.  Seizure prophylaxis.  Keppra 500 mg twice daily.  EEG negative 11.  Hyperlipidemia.  Lipitor 12.  Incidental findings meningioma.  Follow-up outpatient neurosurgery Dr. Venetia Constable 13.  Hyperglycemia related to tube feeds.  SSI  Slightly elevated on 2/27 14.  global aphasia  Mainly aphasic but with good level of alertness  15. Urinary retention:  UA negative  Urecholine 5mg  TID, increased to 10 on 3/27, increased to QID 3/28 Add flomax-but unable to crush,ask SLP to try whole pills . may trial low dose crushable doxazosin, but will need to monitor for orthostatic hypotension  LOS: 9 days A FACE TO FACE EVALUATION WAS PERFORMED  Charlett Blake 11/10/2020, 8:51 AM

## 2020-11-10 NOTE — Progress Notes (Signed)
Physical Therapy Session Note  Patient Details  Name: Brittany Griffith MRN: [id number] Date of Birth: [birth year]  Today's Date: 11/10/2020 PT Individual Time: 1100-1155 PT Individual Time Calculation (min): 55 min   Short Term Goals: Week 2:  PT Short Term Goal 1 (Week 2): Patient will ambulate >67ft with MaxA +2 and LRAD PT Short Term Goal 2 (Week 2): Patient will complete bed<>wc transfers with LRAD and ModA x1 PT Short Term Goal 3 (Week 2): Patient will maintain static sitting balance >2 mins with SBA  Skilled Therapeutic Interventions/Progress Updates:    Patient received sitting up in wc, agreeable to PT. Interpretor present. She denied pain. PT transporting patient in wc to therapy gym for time management. She was able to transfer to therapy mat with MaxA x1 initiating weightbearing through B LE. Patient able to maintain static sitting balance with close supervision. Static standing balance with mirror for visual feedback with ModA. Once mirror was removed, patient assuming slight R lateral lean on PT, but was able to adjust back to midline with verbal cuing to do so. Patient ambulating 48ft with HHA, MaxA x1. ModA/MaxA to advance R LE with noted improved R hip activation to initiate swing through. No active R knee flexion noted through swing resulting in circumducted gait and ER foot. Patient improving ability to take reciprocal steps, as opposed to step-to gait pattern. Patient then ambulating 43ft with ModA x1 + L hallway handrail. Significant improvement in postural stability (minimal anterior trunk lean compared to previous gait attempts), reciprocal stepping and gait speed noted. Patient ambulating short distances without L handrail assist maintaining ModA x1 when there were interruptions in handrails. Patient ambulating additional 61ft ModA/MaxA x1. Patient then ambulating 43ft with ModA x1 HHA + 2nd person facilitating R knee flexion through swing phase to aid in foot clearance and typical  gait pattern. Patient returning to room in wc, transferring back to bed via stand pivot with Cat[place] with patient initiating majority of this transfer. Returning supine with ModA. Bed alarm on, call light within reach, NT present in room.   Therapy Documentation Precautions:  Precautions Precautions: Fall,Other (comment) Precaution Comments: global aphasia, dense R hemiparesis, R inattention Restrictions Weight Bearing Restrictions: No    Therapy/Group: Individual Therapy  Karoline Caldwell, PT, DPT, CBIS  11/10/2020, 7:41 AM

## 2020-11-10 NOTE — Progress Notes (Signed)
Occupational Therapy Session Note  Patient Details  Name: Brittany Griffith MRN: [id number] Date of Birth: August 08, 1951  Today's Date: 11/10/2020 OT Individual Time: 8563-1497 OT Individual Time Calculation (min): 68 min    Short Term Goals: Week 2:  OT Short Term Goal 1 (Week 2): Pt husband will demo PROM techniques OT Short Term Goal 2 (Week 2): Pt will thread LUE first into shirt wiht max multimodal cuing OT Short Term Goal 3 (Week 2): pt will thread LLE with CGA fo sitting balance only when donning pants OT Short Term Goal 4 (Week 2): Pt will compelte toilet transfer wiht MOD A stand pivot  Skilled Therapeutic Interventions/Progress Updates:    Pt completed supine to sit EOB with mod assist and then maintained initial sitting balance with min guard.  One LOB to the right noted, but pt able to self-correct with demonstrational cueing.  She was able to complete stand pivot transfer to the tilt in space wheelchair with mod assist.  Worked on doffing socks, donning TEDs, and then re-donning socks.  Max demonstrational cueing with min assist for removal of socks with total assist for TEDs and mod assist for re-donning them.  She was taken down to the therapy gym where she completed stand pivot transfer to the therapy mat with mod assist.  Worked on RUE neuromuscular re-education throughout session with weightbearing tasks, functional reaching hand over hand, and moveable chain activities with use of the ball and at tilted stool.  She was able to reach and pick up bean bags placed on the right side of her using the LUE with the RUE in weightbearing and overall mod assist to regain balance.  She then needed max assist to work on simple movements of internal rotation with use of the therapy ball and her hand on top of it.  Trace movements of internal rotation noted.  With use of the stool, she was also able to demonstrate activation of elbow extension and shoulder flexion at trace movements. No noted  functional movement in the hand was seen at this time.  Pt with increased fatigue sitting EOM with multiple attempts to stand up and transfer to the wheelchair without warning.  Therapist assisted finally with mod assist and returned to the room where she remained in preparation for OT.  NMES was applied to the right shoulder for facilitation of movement.  She tolerated 60 mins at level 7 intensity.  No adverse reactions were noted to stimulation.  See below for parameters. Saebo Stim One 330 pulse width 35 Hz pulse rate On 8 sec/ off 8 sec Ramp up/ down 2 sec Symmetrical Biphasic wave form  Max intensity 185mA at 500 Ohm load     Therapy Documentation Precautions:  Precautions Precautions: Fall,Other (comment) Precaution Comments: global aphasia, dense R hemiparesis, R inattention Restrictions Weight Bearing Restrictions: No   Pain: Pain Assessment Pain Scale: 0-10 Pain Score: 0-No pain ADL: See Care Tool Section for some details of mobility and selfcare  Therapy/Group: Individual Therapy  Sanyla Summey OTR/L 11/10/2020, 12:46 PM

## 2020-11-11 ENCOUNTER — Inpatient Hospital Stay (HOSPITAL_COMMUNITY): Payer: Medicare HMO

## 2020-11-11 LAB — GLUCOSE, CAPILLARY
Glucose-Capillary: 117 mg/dL — ABNORMAL HIGH (ref 70–99)
Glucose-Capillary: 102 mg/dL — ABNORMAL HIGH (ref 70–99)
Glucose-Capillary: 115 mg/dL — ABNORMAL HIGH (ref 70–99)

## 2020-11-11 NOTE — Progress Notes (Addendum)
Patient ID: Brittany Griffith, female   DOB: 14-Dec-1950, 70 y.o.   MRN: [id number] Follow up on patient care; spouse shown how to intermittent cath patient but still needs to complete return demonstration solo before discharge as patient continues to retain urine and require I+O q 8 hours. Patient also refusing to eat or drink unless husband; reinforce dependence with spouse and encourage patient to drink/eat for nutrition and rationale for good nutrition. Continue to follow along to discharge to address educational needs Margarito Liner

## 2020-11-11 NOTE — Progress Notes (Signed)
   11/11/20 1428  What Happened  Was fall witnessed? No  Was patient injured? No  Patient found on floor  Found by Staff-comment  Stated prior activity other (comment) (pt in bed prior to falll)  Follow Up  MD notified yes  Time MD notified 1439  Family notified Yes - comment  Time family notified 1500  Additional tests Yes-comment (CT scan)  Simple treatment Other (comment) (none)  Adult Fall Risk Assessment  Risk Factor Category (scoring not indicated) High fall risk per protocol (document High fall risk)  Age 70  Fall History: Fall within 6 months prior to admission 5  Elimination; Bowel and/or Urine Incontinence 0  Elimination; Bowel and/or Urine Urgency/Frequency 0  Medications: includes PCA/Opiates, Anti-convulsants, Anti-hypertensives, Diuretics, Hypnotics, Laxatives, Sedatives, and Psychotropics 3  Patient Care Equipment 0  Mobility-Assistance 2  Mobility-Gait 2  Mobility-Sensory Deficit 0  Altered awareness of immediate physical environment 0  Impulsiveness 0  Lack of understanding of one's physical/cognitive limitations 0  Total Score 13  Patient Fall Risk Level High fall risk  Adult Fall Risk Interventions  Required Bundle Interventions *See Row Information* High fall risk - low, moderate, and high requirements implemented  Additional Interventions Use of appropriate toileting equipment (bedpan, BSC, etc.)  Screening for Fall Injury Risk (To be completed on HIGH fall risk patients) - Assessing Need for Floor Mats  Risk For Fall Injury- Criteria for Floor Mats Previous fall this admission  Will Implement Floor Mats Yes  Vitals  Temp 98.8 F (37.1 C)  Temp Source Oral  BP (!) 161/47  MAP (mmHg) 81  BP Location Left Arm  BP Method Automatic  Patient Position (if appropriate) Sitting  Pulse Rate 71  Pulse Rate Source Monitor  Resp 17  Oxygen Therapy  SpO2 99 %  O2 Device Room Air  Pain Assessment  Pain Scale Faces  Faces Pain Scale 0  Neurological   Neuro (WDL) X  Level of Consciousness Alert  Orientation Level Oriented to person  Cognition Follows commands  Speech Expressive aphasia  Motor Function/Sensation Assessment Grip;Motor response  Facial Symmetry Asymmetrical right  R Hand Grip Absent  L Hand Grip Moderate   R Elbow Extension (Push/Biceps) Absent  L Elbow Extension (Push/Biceps) Weak  R Elbow Flexion (Pull/Triceps) Absent  L Elbow Flexion (Pull/Triceps) Weak  Right Pronator Drift Present  Left Pronator Drift Absent  R Foot Dorsiflexion Weak  L Foot Dorsiflexion Weak  R Foot Plantar Flexion Weak  L Foot Plantar Flexion Weak  RUE Motor Response Purposeful movement  RUE Sensation Full sensation  RUE Motor Strength 2  LUE Motor Response Purposeful movement  LUE Sensation Full sensation  LUE Motor Strength 4  RLE Motor Response Purposeful movement  RLE Sensation Full sensation  RLE Motor Strength 2  LLE Motor Response Purposeful movement  LLE Sensation Full sensation  LLE Motor Strength 4  Neuro Symptoms None  Musculoskeletal  Musculoskeletal (WDL) X  Assistive Device Stedy  Generalized Weakness Yes  Weight Bearing Restrictions No  Integumentary  Integumentary (WDL) X  Skin Color Appropriate for ethnicity  Skin Condition Dry  Skin Integrity Ecchymosis  Ecchymosis Location Abdomen  Ecchymosis Location Orientation Bilateral  Ecchymosis Intervention Other (Comment) (assessed)  Skin Turgor Non-tenting    Brittany Griffith

## 2020-11-11 NOTE — Progress Notes (Signed)
PROGRESS NOTE   Subjective/Complaints: BM yesterday , still needing caths ,  Aphasic but good level of alertness   ROS: Limited due to cognition  Objective:   No results found. No results for input(s): WBC, HGB, HCT, PLT in the last 72 hours. No results for input(s): NA, K, CL, CO2, GLUCOSE, BUN, CREATININE, CALCIUM in the last 72 hours.  Intake/Output Summary (Last 24 hours) at 11/11/2020 0910 Last data filed at 11/10/2020 2130 Gross per 24 hour  Intake 0 ml  Output 1615 ml  Net -1615 ml        Physical Exam: Vital Signs Blood pressure (!) 119/52, pulse 64, temperature 99 F (37.2 C), temperature source Oral, resp. rate 14, height 5\' 2"  (1.575 m), weight 66.3 kg, SpO2 98 %.  General: No acute distress Mood and affect are appropriate Heart: Regular rate and rhythm no rubs murmurs or extra sounds Lungs: Clear to auscultation, breathing unlabored, no rales or wheezes Abdomen: Positive bowel sounds, soft nontender to palpation, nondistended Extremities: No clubbing, cyanosis, or edema Skin: No evidence of breakdown, no evidence of rash  Skin: No evidence of breakdown, no evidence of rash, No skin areas Left buttocks  MSK- no pain with Left hip ROM , pain with palp L glut,  Motor: RUE/RLE: Appear to be 0/5 proximal distal LUE/LLE: Grossly 5/5 proximal distal Sensation grimaces with R 5th digit nailbed pinch  Global aphasia  Assessment/Plan: 1. Functional deficits which require 3+ hours per day of interdisciplinary therapy in a comprehensive inpatient rehab setting.  Physiatrist is providing close team supervision and 24 hour management of active medical problems listed below.  Physiatrist and rehab team continue to assess barriers to discharge/monitor patient progress toward functional and medical goals  Care Tool:  Bathing    Body parts bathed by patient: Chest,Abdomen,Left upper leg,Face,Right upper leg,Right  arm   Body parts bathed by helper: Left arm,Front perineal area,Buttocks,Left lower leg,Right lower leg     Bathing assist Assist Level: Moderate Assistance - Patient 50 - 74%     Upper Body Dressing/Undressing Upper body dressing   What is the patient wearing?: Pull over shirt    Upper body assist Assist Level: Maximal Assistance - Patient 25 - 49%    Lower Body Dressing/Undressing Lower body dressing      What is the patient wearing?: Pants     Lower body assist Assist for lower body dressing: Maximal Assistance - Patient 25 - 49%     Toileting Toileting    Toileting assist Assist for toileting: 2 Helpers     Transfers Chair/bed transfer  Transfers assist     Chair/bed transfer assist level: Moderate Assistance - Patient 50 - 74%     Locomotion Ambulation   Ambulation assist   Ambulation activity did not occur: N/A  Assist level: Moderate Assistance - Patient 50 - 74% Assistive device: Hand held assist Max distance: 87   Walk 10 feet activity   Assist  Walk 10 feet activity did not occur: Safety/medical concerns  Assist level: Moderate Assistance - Patient - 50 - 74% Assistive device: Hand held assist   Walk 50 feet activity   Assist Walk 50 feet  with 2 turns activity did not occur: Safety/medical concerns  Assist level: Moderate Assistance - Patient - 50 - 74% Assistive device: Hand held assist    Walk 150 feet activity   Assist Walk 150 feet activity did not occur: Safety/medical concerns  Assist level: Total Assistance - Patient < 25% Assistive device: Lite Gait    Walk 10 feet on uneven surface  activity   Assist Walk 10 feet on uneven surfaces activity did not occur: Safety/medical concerns         Wheelchair     Assist Will patient use wheelchair at discharge?: Yes (Per PT long term goals) Type of Wheelchair: Manual           Wheelchair 50 feet with 2 turns activity    Assist    Wheelchair 50 feet with  2 turns activity did not occur: Safety/medical concerns       Wheelchair 150 feet activity     Assist  Wheelchair 150 feet activity did not occur: Safety/medical concerns       Blood pressure (!) 119/52, pulse 64, temperature 99 F (37.2 C), temperature source Oral, resp. rate 14, height 5\' 2"  (1.575 m), weight 66.3 kg, SpO2 98 %.    Medical Problem List and Plan: 1.  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  Impaired mobility:  -DVT/anticoagulation: Continue Lovenox, does have bruising             -antiplatelet therapy: Aspirin 81 mg daily and Brilinta 90 mg twice daily 3. Pain Management: Tylenol as needed 4. Mood: Provide emotional support             -antipsychotic agents: N/A 5. Neuropsych: This patient is not capable of making decisions on her own behalf. 6. Skin/Wound Care: Routine skin checks 7. Fluids/Electrolytes/Nutrition: Routine in and outs 8.  Dysphagia.Diet upgrade to   Dysphagia #2 honey thick liquids.  Nasogastric tube d/ced given improved appetite.   Advance diet as tolerated 9.  Hypertension.  Norvasc decreased to 5 on 3/25, Cozaar 50 mg twice daily.  Monitor with increased mobility   Vitals:   11/10/20 1930 11/11/20 0344  BP: (!) 139/57 (!) 119/52  Pulse: (!) 59 64  Resp: 14 14  Temp: 99.5 F (37.5 C) 99 F (37.2 C)  SpO2: 98% 98%  controlled 4/1  10.  Seizure prophylaxis.  Keppra 500 mg twice daily.  EEG negative 11.  Hyperlipidemia.  Lipitor 12.  Incidental findings meningioma.  Follow-up outpatient neurosurgery Dr. Venetia Constable 13.  Hyperglycemia related to tube feeds.  SSI  Slightly elevated on 2/27 14. global aphasia  Mainly aphasic but with good level of alertness  15. Urinary retention:  UA negative  Urecholine 5mg  TID, increased to 10 on 3/27,  increased to QID 3/28 Add flomax-pt was able to swallowLOS: 10 days A FACE TO [place] E Kamarius Buckbee 11/11/2020, 9:10 AM

## 2020-11-11 NOTE — Progress Notes (Signed)
Physical Therapy Session Note  Patient Details  Name: Brittany Griffith MRN: [id number] Date of Birth: 02/22/1951  Today's Date: 11/11/2020 PT Individual Time: 1100-1155 PT Individual Time Calculation (min): 55 min   Short Term Goals: Week 2:  PT Short Term Goal 1 (Week 2): Patient will ambulate >15ft with MaxA +2 and LRAD PT Short Term Goal 2 (Week 2): Patient will complete bed<>wc transfers with LRAD and ModA x1 PT Short Term Goal 3 (Week 2): Patient will maintain static sitting balance >2 mins with SBA  Skilled Therapeutic Interventions/Progress Updates:   Pt received sitting in WC and agreeable to PT. Pt transported to rehab gym in WC. Seated knee extension x 5 BLE. h9ip flexion x 5 BLE with cues for awareness of RLE.     Sit<>stand from WC with min assist x 10 throughout session with assist to maintain neutral midline. Weight shifting R and L in standing x 10 with mod assist to go beyond midline to the L. pregait stepping to target x 10 BLE with min assist on the LLE and max cues for increased step length and mod assist on the RLE to improve weight shift to offload the RLE.   Gait training with RW and RUE hand splint 2 x 50ft with mod-max assist to facilitate adequate step length on the RLE and weight shift to the L to off the RLE with initiation of limb advancement.   Pt performed stand pivot transfer to nustep with min assist and UE support on arm rest and RLE blocked.  nustep reciprocal movement training 3 min +2 min wuth BLE only and min-mod assist to initiate hip/knee flexion/extension on the RLE. Attempted to use BUE on the 2nd bout, but poor trunk rotation created excess stress on the R GH joint.   Pt returned to room and performed stand pivot transfer to bed with min assist and UE support on bed rail. Sit>supine completed with min assist at the RLE, and left supine in bed with call bell in reach and all needs met.         Therapy Documentation Precautions:   Precautions Precautions: Fall,Other (comment) Precaution Comments: global aphasia, dense R hemiparesis, R inattention Restrictions Weight Bearing Restrictions: No   Pain: Pain Assessment Pain Scale: 0-10 Pain Score: 0-No pain    Therapy/Group: Individual Therapy  Austin E Tucker 11/11/2020, 12:07 PM  

## 2020-11-11 NOTE — Progress Notes (Signed)
Speech Language Pathology Daily Session Note  Patient Details  Name: Brittany Griffith MRN: [id number] Date of Birth: 24-Sep-1950  Today's Date: 11/11/2020 SLP Individual Time: 5027-7412 SLP Individual Time Calculation (min): 45 min  Short Term Goals: Week 2: SLP Short Term Goal 1 (Week 2): Pt will consume Dys 2 texture trials with minimal right buccal pocketing and no more than Moderate cues required to clear X3 prior to upgrade. SLP Short Term Goal 2 (Week 2): Pt will consume therapeutic trials of ice and or thin H2O via spoon without decline in respiratory status or other vitals across 1 week prior to repeat MBSS. SLP Short Term Goal 3 (Week 2): Pt will voice on command in 25% of opportunities with Max A multimodal cueing. SLP Short Term Goal 4 (Week 2): Pt will imitate oral motor commands with 50% accuracy with Max A multimodal cueing. SLP Short Term Goal 5 (Week 2): Pt will communicate basic wants and needs via gestures and/or communication board with 75% accuracy with Max A multimodal cueing.  Skilled Therapeutic Interventions: Pt was seen for skilled ST targeting dysphagia and communication goals. SLP facilitated session with an advanced trial breakfast tray of Dys 2 (minced/ground) textures with honey thick liquids. Although Min right buccal pocketing and right anterior loss of larger Dys 2 boluses observed, with assistance controlling bolus size, use of spoonfuls of honey thick liquids, and oral care at end of meal, pt achieved full oral clearance. Mastication was mildly prolonged but not appreciable difference noted between Dys 1 and Dys 2 textures. No overt s/sx aspiration were observed throughout intake. Recommend upgrade to Dys 2 textures, continue honey thick liquids, medications whole in applesauce 1 at a time, with large pills being cut in half when able, full supervision with PO. SLP also provided a new handout regarding Dys 2 textures to pt's husband, as he sometimes prefers to bring  outside food items to pt. SLP further facilitated session with opportunities for voicing and verbalizations. Although unable to accurately imitate individual phonemes or hum to the happy birthday song, pt was more responsive and vocal with attempt at informal conversational and sometimes more automatic response questions. Pt verbalized "son" and "yes" "mhm" and "so" during conversation in response to SLP's questions. Pt did become increasingly fatigued during session and missed last 15 minutes of skilled ST session. Pt left in bed with alarm set and needs within reach, husband and interpretor still present. SLP communicated with RN regarding new diet recommendations. Continue per current plan of care.         Pain Pain Assessment Pain Scale: 0-10 Pain Score: 0-No pain  Therapy/Group: Individual Therapy  Arbutus Leas 11/11/2020, 9:17 AM

## 2020-11-11 NOTE — Progress Notes (Signed)
Occupational Therapy Session Note  Patient Details  Name: Brittany Griffith MRN: [id number] Date of Birth: [birth year]  Today's Date: 11/11/2020 OT Individual Time: 1005-1100 OT Individual Time Calculation (min): 55 min    Short Term Goals: Week 2:  OT Short Term Goal 1 (Week 2): Pt husband will demo PROM techniques OT Short Term Goal 2 (Week 2): Pt will thread LUE first into shirt wiht max multimodal cuing OT Short Term Goal 3 (Week 2): pt will thread LLE with CGA fo sitting balance only when donning pants OT Short Term Goal 4 (Week 2): Pt will compelte toilet transfer wiht MOD A stand pivot  Skilled Therapeutic Interventions/Progress Updates:    Pt completed bathing and dressing during session with spouse present.  Mod assist for transfer to the EOB with max assist for functional mobility to the shower bench.  Mod assist for advancement of the LLE with ambulation.  Once in the bathroom, she needed mod instructional cueing and mod assist for removal of gripper socks and pullover gown.  Worked on showering with overall max demonstrational cueing for sequencing.  Pt with decreased thoroughness for washing her head as she tends to perseverate on washing her face initially.  She did do a better job of sequencing through washing of her chest, abdomen, and right arm without cueing.  Max hand over hand assist for using the RUE to wash the left arm.  Slight increased tone in the right arm noted when attempting to facilitate hand over hand use.  Mod assist for sit to stand for washing her peri area with therapist assisting with rinsing.  Next, had her complete transfer out to the wheelchair after drying off with max assist.  Worked on dressing with max demonstrational cueing to start with dressing the RUE or RLE.  She continues to neglect the right side and exhibit dressing apraxia.  When presented with two items to donn, she picked the shirt, but continued to try and put them on as pants instead of a shirt.  Mod  assist for donning pullover with max assist for brief and pants.  Therapist assisted with donning the TEDs while she required max assist with gripper socks using single handed technique.  Finished session with pt in the tilt in space wheelchair with the NMES applied to the right shoulder.  She tolerated level 7 intensity with the settings below without any adverse reactions.   [place]o Stim One 330 pulse width 35 Hz pulse rate On 8 sec/ off 8 sec Ramp up/ down 2 sec Symmetrical Biphasic wave form  Max intensity 153mA at 500 Ohm load   Therapy Documentation Precautions:  Precautions Precautions: Fall,Other (comment) Precaution Comments: global aphasia, dense R hemiparesis, R inattention Restrictions Weight Bearing Restrictions: No  Pain: Pain Assessment Pain Scale: Faces Pain Score: 0-No pain ADL: See Care Tool Section for some details of mobility and selfcare  Therapy/Group: Individual Therapy  Khadar Monger OTR/L 11/11/2020, 12:43 PM

## 2020-11-12 DIAGNOSIS — W19XXXD Unspecified fall, subsequent encounter: Secondary | ICD-10-CM

## 2020-11-12 DIAGNOSIS — R4701 Aphasia: Secondary | ICD-10-CM | POA: Diagnosis not present

## 2020-11-12 DIAGNOSIS — I63512 Cerebral infarction due to unspecified occlusion or stenosis of left middle cerebral artery: Secondary | ICD-10-CM | POA: Diagnosis not present

## 2020-11-12 DIAGNOSIS — I10 Essential (primary) hypertension: Secondary | ICD-10-CM | POA: Diagnosis not present

## 2020-11-12 DIAGNOSIS — R1312 Dysphagia, oropharyngeal phase: Secondary | ICD-10-CM | POA: Diagnosis not present

## 2020-11-12 LAB — GLUCOSE, CAPILLARY
Glucose-Capillary: 88 mg/dL (ref 70–99)
Glucose-Capillary: 115 mg/dL — ABNORMAL HIGH (ref 70–99)
Glucose-Capillary: 107 mg/dL — ABNORMAL HIGH (ref 70–99)
Glucose-Capillary: 124 mg/dL — ABNORMAL HIGH (ref 70–99)

## 2020-11-12 NOTE — Progress Notes (Signed)
Physical Therapy Session Note  Patient Details  Name: Brittany Griffith MRN: [id number] Date of Birth: 03/11/1951  Today's Date: 11/12/2020 PT Individual Time: 1100-1200 PT Individual Time Calculation (min): 60 min   Short Term Goals: Week 2:  PT Short Term Goal 1 (Week 2): Patient will ambulate >67f with MaxA +2 and LRAD PT Short Term Goal 2 (Week 2): Patient will complete bed<>wc transfers with LRAD and ModA x1 PT Short Term Goal 3 (Week 2): Patient will maintain static sitting balance >2 mins with SBA  Skilled Therapeutic Interventions/Progress Updates:   Pt received supine in bed and agreeable to PT. Pt's son present with questions for PT about safety concerns following fall yesterday afternoon. PT educated pt on hospital policy and fall and safety management procedures. Supine>sit transfer with min assist at trunk. Sitting balance EOB with CGA x 2 min. [facility]   Gait training with RW x 538fwith mod assist from PT to facilitate weight shift L and initiate advancement of RLE. Noted to have to drag throughout. Gait training then performed at rail in hall 2  [id number] mod assist for advancement of the RLE only. Noted to have improved weight shift with UE support on Rail compared to with RW. Dynamic gait training in parallel bars to perform side stepping R and L 3 x [id number] with mod-max assist to initiate hip/knee flexion on the R as well as weight shift to the L.   Coordination training to performed reciprocal foot taps to target on floor. Pt unable to coordinate reciprocal motion and then attempted to perform Single limb foot tab x 8 on the LLE, but unable to perform with the RLE due to increasing midline disorientation.   Throughout session pt performed sit<>stand [facility] min assist from PT and min assist for stand pivot transfer with UE supported on the L side.  Patient returned to room and left  [facility] reach and all needs met.           Therapy Documentation Precautions:  Precautions Precautions: Fall,Other (comment) Precaution Comments: global aphasia, dense R hemiparesis, R inattention Restrictions Weight Bearing Restrictions: No Vital Signs: Therapy Vitals Pulse Rate: 66 Resp: 16 BP: (!) 124/54 Patient Position (if appropriate): Sitting Oxygen Therapy SpO2: 99 % O2 Device: Room Air Pain: Pain Assessment Pain Scale: 0-10 Pain Score: 0-No pain    Therapy/Group: Individual Therapy  AustLorie Phenix/2022, 2:02 PM

## 2020-11-12 NOTE — Progress Notes (Signed)
Occupational Therapy Session Note  Patient Details  Name: Brittany Griffith MRN: [id number] Date of Birth: 1951/07/12  Today's Date: 11/12/2020 OT Individual Time: 5400-8676 OT Individual Time Calculation (min): 25 min    Short Term Goals: Week 1:  OT Short Term Goal 1 (Week 1): Pt will complete UB bathing with mod demonstrational cueing and min assist from supported sitting. OT Short Term Goal 1 - Progress (Week 1): Met OT Short Term Goal 2 (Week 1): Pt will complete LB bathing sit to stand with max assist and mod demonstrational cueing for sequencing. OT Short Term Goal 2 - Progress (Week 1): Met OT Short Term Goal 3 (Week 1): Pt will complete toilet transfer with mod assist squat pivot to drop arm commode. OT Short Term Goal 3 - Progress (Week 1): Met OT Short Term Goal 4 (Week 1): Pt's spouse will return demonstrate safe assist with PROM exercises with the RUE. OT Short Term Goal 4 - Progress (Week 1): Progressing toward goal OT Short Term Goal 5 (Week 1): Pt will maintain static sitting balance with supervision EOB or EOM in preparation for selfcare tasks. OT Short Term Goal 5 - Progress (Week 1): Met  Skilled Therapeutic Interventions/Progress Updates: Patient initially declined therapy with husband conveying she wanted to wait the full 24 hours after her fall on yesterday.   However, she and husband did concur to completing oral care training for hand over hand assistance and the importance of helping Brittany Griffith use her right hand to increase brain/right upper extremity neuro re education.   She required assist to sequence steps and total assist to use right upper extremity as stabilizer.  As well, husband was educated on following the hemi plegic bed positioning chart above patient's bed as she wanted to ly on her right side of body.  Patient, husband and interpreter were left in room when this staff member exited.  Continue OT Plan of care     Therapy Documentation Precautions:   Precautions Precautions: Fall,Other (comment) Precaution Comments: global aphasia, dense R hemiparesis, R inattention Restrictions Weight Bearing Restrictions: No General: General OT Amount of Missed Time: 35 Minutes patient sick  Pain: Pain Assessment Pain Scale: 0-10 Pain Score: 0-No pain  Therapy/Group: Individual Therapy  Alfredia Ferguson [facility] 11/12/2020, 3:20 PM

## 2020-11-12 NOTE — Progress Notes (Signed)
PROGRESS NOTE   Subjective/Complaints:  Patient still requiring Caths every 6 hours  family is asking for grounds pass-I said I could allow that for the weekend but they will have to get approval during the week According to the daughter patient had a fall last night and was found on the floor-I explained I will try to see if we can get her moved closer to the nurses station but we will definitely order a telesitter and try to have all 4 rails up at all times per their request.  I also went over the CT results myself and found that it looked well for the situation-no acute injury from the fall  ROS: limited by cognition  Objective:   CT HEAD WO CONTRAST  Result Date: 11/11/2020 CLINICAL DATA:  LEFT MCA infarction EXAM: CT HEAD WITHOUT CONTRAST TECHNIQUE: Contiguous axial images were obtained from the base of the skull through the vertex without intravenous contrast. COMPARISON:  MRI 10/30/2020, CT 10/27/2020 FINDINGS: Brain: Large middle cerebral territory infarction again noted. No significant interval change. No intraparenchymal or extra-axial hemorrhage. No intraventricular hemorrhage. Basilar cisterns are patent. No hydrocephalus. 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 Vascular: LEFT MCA stent noted. 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 Sinuses/Orbits: Fluid in the maxillary sinuses unchanged. Orbits normal Other: None IMPRESSION: 1. No significant change. 2. 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 3. No hemorrhagic conversion identified by CT. No hydrocephalus. Basal cisterns are patent. Electronically Signed   By: Suzy Bouchard M.D.   On: 11/11/2020 18:14   No results for input(s): WBC, HGB, HCT, PLT in the last 72 hours. No results for input(s): NA, K, CL, CO2, GLUCOSE, BUN, CREATININE, CALCIUM in  the last 72 hours.  Intake/Output Summary (Last 24 hours) at 11/12/2020 1959 Last data filed at 11/12/2020 1846 Gross per 24 hour  Intake 360 ml  Output 1350 ml  Net -990 ml        Physical Exam: Vital Signs Blood pressure (!) 114/52, pulse 68, temperature 98.1 F (36.7 C), resp. rate 18, height 5\' 2"  (1.575 m), weight 66.3 kg, SpO2 94 %.   General: awake, alert, appropriate, completely aphasic- nonverbal; son and daughter-in-law are in the room-we had a prolonged conversation, NAD-of note patient is sitting in the manual wheelchair HENT: conjugate gaze; oropharynx moist CV: regular rate; no JVD Pulmonary: CTA B/L; no W/R/R- good air movement GI: soft, NT, ND, (+)BS Psychiatric: nodding yes and no - specifically nodded no about pain- appears comfortable Neurological: awake, nodding head yes/no Skin: No evidence of breakdown, no evidence of rash, No skin areas Left buttocks  MSK- no pain with Left hip ROM , pain with palp L glut,  Motor: RUE/RLE: Appear to be 0/5 proximal distal LUE/LLE: Grossly 5/5 proximal distal Sensation grimaces with R 5th digit nailbed pinch  Global aphasia  Assessment/Plan: 1. Functional deficits which require 3+ hours per day of interdisciplinary therapy in a comprehensive inpatient rehab setting.  Physiatrist is providing close team supervision and 24 hour management of active medical problems listed below.  Physiatrist and rehab team continue to assess  barriers to discharge/monitor patient progress toward functional and medical goals  Care Tool:  Bathing    Body parts bathed by patient: Chest,Abdomen,Left upper leg,Face,Right upper leg,Right arm,Right lower leg   Body parts bathed by helper: Left lower leg,Left arm,Front perineal area,Buttocks     Bathing assist Assist Level: Moderate Assistance - Patient 50 - 74%     Upper Body Dressing/Undressing Upper body dressing   What is the patient wearing?: Pull over shirt    Upper body assist  Assist Level: Moderate Assistance - Patient 50 - 74%    Lower Body Dressing/Undressing Lower body dressing      What is the patient wearing?: Pants,Incontinence brief     Lower body assist Assist for lower body dressing: Maximal Assistance - Patient 25 - 49%     Toileting Toileting    Toileting assist Assist for toileting: 2 Helpers     Transfers Chair/bed transfer  Transfers assist     Chair/bed transfer assist level: Moderate Assistance - Patient 50 - 74%     Locomotion Ambulation   Ambulation assist   Ambulation activity did not occur: N/A  Assist level: Maximal Assistance - Patient 25 - 49% Assistive device: Hand held assist Max distance: 12'   Walk 10 feet activity   Assist  Walk 10 feet activity did not occur: Safety/medical concerns  Assist level: Moderate Assistance - Patient - 50 - 74% Assistive device: Hand held assist   Walk 50 feet activity   Assist Walk 50 feet with 2 turns activity did not occur: Safety/medical concerns  Assist level: Moderate Assistance - Patient - 50 - 74% Assistive device: Hand held assist    Walk 150 feet activity   Assist Walk 150 feet activity did not occur: Safety/medical concerns  Assist level: Total Assistance - Patient < 25% Assistive device: Lite Gait    Walk 10 feet on uneven surface  activity   Assist Walk 10 feet on uneven surfaces activity did not occur: Safety/medical concerns         Wheelchair     Assist Will patient use wheelchair at discharge?: Yes (Per PT long term goals) Type of Wheelchair: Manual           Wheelchair 50 feet with 2 turns activity    Assist    Wheelchair 50 feet with 2 turns activity did not occur: Safety/medical concerns       Wheelchair 150 feet activity     Assist  Wheelchair 150 feet activity did not occur: Safety/medical concerns       Blood pressure (!) 114/52, pulse 68, temperature 98.1 F (36.7 C), resp. rate 18, height 5\' 2"   (1.575 m), weight 66.3 kg, SpO2 94 %.    Medical Problem List and Plan: 1.  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  Also went over the CT scan and individually reviewed the results of that CT scan myself and agree with  radiologist 2.  Impaired mobility:  -DVT/anticoagulation: Continue Lovenox, does have bruising             -antiplatelet therapy: Aspirin 81 mg daily and Brilinta 90 mg twice daily 3. Pain Management: Tylenol as needed  4/2-patient does not appear to be in any pain and  appears very comfortable-continue Tylenol as needed 4. Mood: Provide emotional support             -antipsychotic agents: N/A 5. Neuropsych: This patient is not capable of making decisions on her own behalf. 6. Skin/Wound Care: Routine skin checks 7. Fluids/Electrolytes/Nutrition: Routine in and outs 8.  Dysphagia.Diet upgrade to   Dysphagia #2 honey thick liquids.  Nasogastric tube d/ced given improved appetite.   Advance diet as tolerated 9.  Hypertension.  Norvasc decreased to 5 on 3/25, Cozaar 50 mg twice daily.  Monitor with increased mobility   Vitals:   11/12/20 1406 11/12/20 1937  BP: (!) 131/50 (!) 114/52  Pulse: 64 68  Resp: 18 18  Temp: 98.1 F (36.7 C) 98.1 F (36.7 C)  SpO2: 100% 94%  4/2-blood pressure controlled-continue regimen  10.  Seizure prophylaxis.  Keppra 500 mg twice daily.  EEG negative 11.  Hyperlipidemia.  Lipitor 12.   Incidental findings meningioma.  Follow-up outpatient neurosurgery Dr. Venetia Constable 13.  Hyperglycemia related to tube feeds.  SSI  Slightly elevated on 2/27 14. global aphasia  Mainly aphasic but with good level of alertness  4/2-discussed things that family might be able to do in the future to help aphasia 15. Urinary retention:  UA negative  Urecholine 5mg  TID, increased to 10 on 3/27, increased to QID 3/28 Add flomax-pt was able to swallow  4/2-although the chart on the wall sent patient passed to have all medicines crushed in applesauce, patient was able to swallow an entire Flomax pill-so she is on Flomax to see if she can start voiding on her own.  I spent 35 minutes on care of this patient today, discussing with the charge nurse as well as the family and reviewing the CT-more than 50% of this time was spent on coordination of care.  LOS: 11 days A FACE TO FACE EVALUATION WAS PERFORMED  Brittany Griffith 11/12/2020, 7:59 PM

## 2020-11-13 DIAGNOSIS — I63512 Cerebral infarction due to unspecified occlusion or stenosis of left middle cerebral artery: Secondary | ICD-10-CM | POA: Diagnosis not present

## 2020-11-13 DIAGNOSIS — R1312 Dysphagia, oropharyngeal phase: Secondary | ICD-10-CM | POA: Diagnosis not present

## 2020-11-13 DIAGNOSIS — G936 Cerebral edema: Secondary | ICD-10-CM | POA: Diagnosis not present

## 2020-11-13 DIAGNOSIS — I10 Essential (primary) hypertension: Secondary | ICD-10-CM | POA: Diagnosis not present

## 2020-11-13 LAB — GLUCOSE, CAPILLARY
Glucose-Capillary: 89 mg/dL (ref 70–99)
Glucose-Capillary: 100 mg/dL — ABNORMAL HIGH (ref 70–99)
Glucose-Capillary: 111 mg/dL — ABNORMAL HIGH (ref 70–99)
Glucose-Capillary: 187 mg/dL — ABNORMAL HIGH (ref 70–99)

## 2020-11-13 NOTE — Progress Notes (Signed)
PROGRESS NOTE   Subjective/Complaints:  Patient has not required in any in and out caths for 24 hours-her PVRs are less than 50 the last 3-4 times. It appears that she feels the urge when she needs to pee; her son calls the nurse and they are able to toilet her. Patient's last bowel movement was yesterday. Son has a lot of questions about when we can stop the Keppra because she has never had seizures he feels the Keppra was placed on board secondary to her meningioma and wonders when it can be stopped. He also asked a lot of questions about prognosis, about her bladder and about follow-up.  ROS: Limited due to being nonverbal  Objective:   No results found. No results for input(s): WBC, HGB, HCT, PLT in the last 72 hours. No results for input(s): NA, K, CL, CO2, GLUCOSE, BUN, CREATININE, CALCIUM in the last 72 hours.  Intake/Output Summary (Last 24 hours) at 11/13/2020 1845 Last data filed at 11/13/2020 1836 Gross per 24 hour  Intake 480 ml  Output --  Net 480 ml        Physical Exam: Vital Signs Blood pressure (!) 107/46, pulse 67, temperature 100.1 F (37.8 C), temperature source Oral, resp. rate 18, height 5\' 2"  (1.575 m), weight 66.5 kg, SpO2 100 %.    General: awake, staring at her son and I talking; ; nonverbal; lying supine in bed; son at bedside NAD HENT:oropharynx moist CV: regular rate; no JVD Pulmonary: CTA B/L; no W/R/R- good air movement GI: soft, NT, ND, (+)BS; hypoactive Psychiatric: flat affect and facies; no interaction this AM Neurological: nonverbal  Skin: No evidence of breakdown, no evidence of rash, No skin areas Left buttocks  MSK- no pain with Left hip ROM , pain with palp L glut,  Motor: RUE/RLE: Appear to be 0/5 proximal distal LUE/LLE: Grossly 5/5 proximal distal Sensation grimaces with R 5th digit nailbed pinch  Global aphasia  Assessment/Plan: 1. Functional deficits which require 3+  hours per day of interdisciplinary therapy in a comprehensive inpatient rehab setting.  Physiatrist is providing close team supervision and 24 hour management of active medical problems listed below.  Physiatrist and rehab team continue to assess barriers to discharge/monitor patient progress toward functional and medical goals  Care Tool:  Bathing    Body parts bathed by patient: Chest,Abdomen,Left upper leg,Face,Right upper leg,Right arm,Right lower leg   Body parts bathed by helper: Left lower leg,Left arm,Front perineal area,Buttocks     Bathing assist Assist Level: Moderate Assistance - Patient 50 - 74%     Upper Body Dressing/Undressing Upper body dressing   What is the patient wearing?: Pull over shirt    Upper body assist Assist Level: Moderate Assistance - Patient 50 - 74%    Lower Body Dressing/Undressing Lower body dressing      What is the patient wearing?: Pants,Incontinence brief     Lower body assist Assist for lower body dressing: Maximal Assistance - Patient 25 - 49%     Toileting Toileting    Toileting assist Assist for toileting: 2 Helpers     Transfers Chair/bed transfer  Transfers assist     Chair/bed transfer assist  level: Moderate Assistance - Patient 50 - 74%     Locomotion Ambulation   Ambulation assist   Ambulation activity did not occur: N/A  Assist level: Maximal Assistance - Patient 25 - 49% Assistive device: Hand held assist Max distance: 12'   Walk 10 feet activity   Assist  Walk 10 feet activity did not occur: Safety/medical concerns  Assist level: Moderate Assistance - Patient - 50 - 74% Assistive device: Hand held assist   Walk 50 feet activity   Assist Walk 50 feet with 2 turns activity did not occur: Safety/medical concerns  Assist level: Moderate Assistance - Patient - 50 - 74% Assistive device: Hand held assist    Walk 150 feet activity   Assist Walk 150 feet activity did not occur: Safety/medical  concerns  Assist level: Total Assistance - Patient < 25% Assistive device: Lite Gait    Walk 10 feet on uneven surface  activity   Assist Walk 10 feet on uneven surfaces activity did not occur: Safety/medical concerns         Wheelchair     Assist Will patient use wheelchair at discharge?: Yes (Per PT long term goals) Type of Wheelchair: Manual           Wheelchair 50 feet with 2 turns activity    Assist    Wheelchair 50 feet with 2 turns activity did not occur: Safety/medical concerns       Wheelchair 150 feet activity     Assist  Wheelchair 150 feet activity did not occur: Safety/medical concerns       Blood pressure (!) 107/46, pulse 67, temperature 100.1 F (37.8 C), temperature source Oral, resp. rate 18, height 5\' 2"  (1.575 m), weight 66.5 kg, SpO2 100 %.    Medical Problem List and Plan: 1.  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  Also went over the CT scan and individually reviewed the results of that CT scan myself and agree with radiologist 2.  Impaired mobility:  -DVT/anticoagulation: Continue Lovenox, does have bruising             -antiplatelet therapy: Aspirin 81 mg daily and Brilinta 90 mg twice daily 3. Pain Management: Tylenol as needed  4/2-patient does not appear to be in any pain and  appears very comfortable-continue Tylenol as needed 4. Mood: Provide emotional support              -antipsychotic agents: N/A 5. Neuropsych: This patient is not capable of making decisions on her own behalf. 6. Skin/Wound Care: Routine skin checks 7. Fluids/Electrolytes/Nutrition: Routine in and outs 8.  Dysphagia.Diet upgrade to   Dysphagia #2 honey thick liquids.  Nasogastric tube d/ced given improved appetite.   Advance diet as tolerated 9.  Hypertension.  Norvasc decreased to 5 on 3/25, Cozaar 50 mg twice daily.  Monitor with increased mobility   Vitals:   11/13/20 0544 11/13/20 1344  BP: (!) 121/53 (!) 107/46  Pulse: 64 67  Resp: 16 18  Temp: 98.4 F (36.9 C) 100.1 F (37.8 C)  SpO2: 98% 100%  4/3-blood pressures controlled; continue regimen  10.  Seizure prophylaxis.  Keppra 500 mg twice daily.  EEG negative  4/3-son has a lot of questions of when Keppra can be stopped; I explained she will need to follow-up with neurology after discharge and they can make that determination. 11.  Hyperlipidemia.  Lipitor 12.  Incidental findings meningioma.  Follow-up outpatient neurosurgery Dr. Venetia Constable 13.  Hyperglycemia related to tube feeds.  SSI  Slightly elevated on 2/27 14. global aphasia  Mainly aphasic but with good level of alertness  4/2-discussed things that family might be able to do in the future to help aphasia 15. Urinary retention:  UA negative  Urecholine 5mg  TID, increased to 10 on 3/27, increased to QID 3/28 Add flomax-pt was able to swallow  4/2-although the chart on the wall sent patient passed to have all medicines crushed in applesauce, patient was able to swallow an entire Flomax pill-so she is on Flomax to see if she can start voiding on her own.  4/3-patient has been able to void on her own for the last 24 hours; her PVRs are less than 50cc-suggested to the nurse that they stop them tomorrow if they continue to be less than 50cc  I spent 35 minutes on care of this patient today, discussing with the charge nurse as well as the family and reviewing the CT-more  than 50% of this time was spent on coordination of care.  LOS: 12 days A FACE TO FACE EVALUATION WAS PERFORMED  Idelia Caudell 11/13/2020, 6:45 PM

## 2020-11-14 DIAGNOSIS — I63512 Cerebral infarction due to unspecified occlusion or stenosis of left middle cerebral artery: Secondary | ICD-10-CM | POA: Diagnosis not present

## 2020-11-14 LAB — GLUCOSE, CAPILLARY
Glucose-Capillary: 95 mg/dL (ref 70–99)
Glucose-Capillary: 83 mg/dL (ref 70–99)
Glucose-Capillary: 120 mg/dL — ABNORMAL HIGH (ref 70–99)
Glucose-Capillary: 158 mg/dL — ABNORMAL HIGH (ref 70–99)

## 2020-11-14 NOTE — Progress Notes (Signed)
Occupational Therapy Session Note  Patient Details  Name: Brittany Griffith MRN: [id number] Date of Birth: [birth year]  Today's Date: 11/14/2020 OT Individual Time: 6269-4854 OT Individual Time Calculation (min): 53 min    Short Term Goals: Week 2:  OT Short Term Goal 1 (Week 2): Pt husband will demo PROM techniques OT Short Term Goal 2 (Week 2): Pt will thread LUE first into shirt wiht max multimodal cuing OT Short Term Goal 3 (Week 2): pt will thread LLE with CGA fo sitting balance only when donning pants OT Short Term Goal 4 (Week 2): Pt will compelte toilet transfer wiht MOD A stand pivot  Skilled Therapeutic Interventions/Progress Updates:    Pt in bed to start with spouse and pt's daughter in the room.  She was able to transfer from supine to sit EOB with mod assist and mod demonstrational cueing for bringing the RUE across her body with the LUE and then bend up the right knee.  Once sitting she was able to maintain static balance with supervision.  Worked on hemi dressing techniques for donning her pullover shirt as well as her pants.  Mod demonstrational cueing for starting with the RUE as well as the RLE.  Min assist for finishing pullover shirt as well as mod assist for all other aspects of donning her pants.  Therapist then assisted with TEDs and she worked on donning her gripper socks with mod assist.  She next completed transfer to the wheelchair at mod assist stand pivot and completed oral hygiene and combing her hair at the sink with mod demonstrational cueing and overall min assist.  Took her down to the dayroom for the second part of session where she worked on standing at the high/low table with the RUE in weightbearing.  She was able to pick up blocks with the LUE and visually scan to the right to place them in a container.  Min assist needed for balance.  Moved container around for pt to locate with searching needed at times secondary to visual field deficit.  Next, had her sit and  work on using the RUE for picking up foam blocks and placing in container for repetitive functional use.  No noted movement detected in the hand with only trace movement in the shoulder to assist with reaching.  Returned to the room at the end of task with pt left sitting up and NMES on the left shoulder for facilitation and treatment of subluxation.  She was able to tolerate 60 mins without any adverse reactions on level 7.  See below for parameters. Saebo Stim One 330 pulse width 35 Hz pulse rate On 8 sec/ off 8 sec Ramp up/ down 2 sec Symmetrical Biphasic wave form  Max intensity 160mA at 500 Ohm load   Therapy Documentation Precautions:  Precautions Precautions: Fall,Other (comment) Precaution Comments: global aphasia, dense R hemiparesis, R inattention Restrictions Weight Bearing Restrictions: No  Pain: Pain Assessment Pain Scale: Faces Pain Score: 0-No pain ADL: See Care Tool Section for some details of mobility and selfcare  Therapy/Group: Individual Therapy  Tiahna Cure OTR/L 11/14/2020, 12:18 PM

## 2020-11-14 NOTE — Progress Notes (Signed)
Physical Therapy Session Note  Patient Details  Name: Brittany Griffith MRN: [id number] Date of Birth: [birth year]  Today's Date: 11/14/2020 PT Individual Time: 1105-1200 PT Individual Time Calculation (min): 55 min   Short Term Goals: Week 2:  PT Short Term Goal 1 (Week 2): Patient will ambulate >27ft with MaxA +2 and LRAD PT Short Term Goal 2 (Week 2): Patient will complete bed<>wc transfers with LRAD and ModA x1 PT Short Term Goal 3 (Week 2): Patient will maintain static sitting balance >2 mins with SBA  Skilled Therapeutic Interventions/Progress Updates:    Patient received sitting up in TIS wc, agreeable to PT. Interpretor and dtr present. She denied pain. PT transporting patient to therapy gym for time management. She was able to ambulate 36ft x3 with L hallway handrail + MinA/ModA. Patient now able to advance R LE independently, though demonstrates very minimal foot clearance and R knee flexion through swing phase. With gaps in the hallway railing, patient able to maintain appropriate dynamic balance with ModA from PT, but appeared to prefer holding onto something with L UE for support. Patient could trial using RW with modified grip again and would likely be able to coordinate gait and RW better. Patient transferring to therapy mat via stand pivot with MinA. She completed hooklying bridges B LE 3x10 with appropriate stabilization of R LE noted and equal hip clearance. Patient with significant difficulty following cues to complete same task, but with R LE only. Max facilitation and cuing needed to do so. Standing R LE toe taps onto 2" box with ModA for postural balance. Patient with intermittent pushing in standing with L LE toward the R- this could also be due to insecurity of completing task without L UE support. Patient noted to have sufficient R hip flexor activation to achieve foot clearance beyond 2". Patient returning to room in wc, seatbelt alarm on, call light within reach.   Therapy  Documentation Precautions:  Precautions Precautions: Fall,Other (comment) Precaution Comments: global aphasia, dense R hemiparesis, R inattention Restrictions Weight Bearing Restrictions: No    Therapy/Group: Individual Therapy  Karoline Caldwell, PT, DPT, CBIS  11/14/2020, 7:44 AM

## 2020-11-14 NOTE — Progress Notes (Signed)
Speech Language Pathology Daily Session Note  Patient Details  Name: Brittany Griffith MRN: [id number] Date of Birth: 12/15/1950  Today's Date: 11/14/2020 SLP Individual Time: 5573-2202 SLP Individual Time Calculation (min): 43 min  Short Term Goals: Week 2: SLP Short Term Goal 1 (Week 2): Pt will consume Dys 2 texture trials with minimal right buccal pocketing and no more than Moderate cues required to clear X3 prior to upgrade. SLP Short Term Goal 2 (Week 2): Pt will consume therapeutic trials of ice and or thin H2O via spoon without decline in respiratory status or other vitals across 1 week prior to repeat MBSS. SLP Short Term Goal 3 (Week 2): Pt will voice on command in 25% of opportunities with Max A multimodal cueing. SLP Short Term Goal 4 (Week 2): Pt will imitate oral motor commands with 50% accuracy with Max A multimodal cueing. SLP Short Term Goal 5 (Week 2): Pt will communicate basic wants and needs via gestures and/or communication board with 75% accuracy with Max A multimodal cueing.  Skilled Therapeutic Interventions:Skilled ST services focused on swallow and speech skills. Pt was able to follow oral commands with visual cues in 80% of opportunities, without visual cues in 40% of opportunities. Pt voiced "sam" when asked son's name, but was unable to repeat it after several trials. melodic intotation therapy at phrase level was unsuccessful. Pt was able to produce several words in unison while singing "happy birthday" and "ba ba black sheep" including "to", "you", "eep", "ba" and "day" after several trials. Pt was able to count in unison 1-10, voicing 70% of sounds after several trials. SLP facilitated oral care prior to trials of thin liquids via ice chip, TSP and cup sips. Pt required cues to chew ice, but trials resulted in swallow appearing timely and no overt s/s aspiration. SLP initiated water protocol (will monitor vitals signs) and plans for MBS on 4/7. Pt was left in room with  family, call bell within reach and chair alarm set. SLP recommends to continue skilled services.     Pain Pain Assessment Pain Score: 0-No pain  Therapy/Group: Individual Therapy  Love Chowning  [facility] 11/14/2020, 4:22 PM

## 2020-11-14 NOTE — Progress Notes (Signed)
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 No results for input(s): WBC, HGB, HCT, PLT in the last 72 hours. No results for input(s): NA, K, CL, CO2, GLUCOSE, BUN, CREATININE, CALCIUM in the last 72 hours.  Intake/Output Summary (Last 24 hours) at 11/14/2020 0904 Last data filed at 11/14/2020 0841 Gross per 24 hour  Intake 360 ml  Output --  Net 360 ml        Physical Exam: Vital Signs Blood pressure (!) 112/56, pulse 65, temperature 98.6 F (37 C), temperature source Oral, resp. rate 16, height 5\' 2"  (1.575 m), weight 63.5 kg, SpO2 97 %.  General: No acute distress Mood and affect are appropriate Heart: Regular rate and rhythm no rubs murmurs or extra sounds Lungs: Clear to auscultation, breathing unlabored, no rales or wheezes Abdomen: Positive bowel sounds, soft nontender to palpation, nondistended Extremities: No clubbing, cyanosis, or edema Skin: No evidence of breakdown, no evidence of rash  Skin: No evidence of breakdown, no evidence of rash, No skin areas Left buttocks  MSK- no pain with Left hip ROM , pain with palp L glut,  Motor: RUE/RLE: Appear to be 0/5 proximal distal LUE/LLE: Grossly 5/5 proximal distal Sensation grimaces with R 5th digit nailbed pinch  Global aphasia  Assessment/Plan: 1. Functional deficits which require 3+ hours per day of interdisciplinary therapy in a comprehensive inpatient rehab setting.  Physiatrist is providing close team supervision and 24 hour management of active medical problems listed below.  Physiatrist and rehab team continue to assess barriers to discharge/monitor patient progress toward functional and medical goals  Care Tool:  Bathing    Body parts bathed by  patient: Chest,Abdomen,Left upper leg,Face,Right upper leg,Right arm,Right lower leg   Body parts bathed by helper: Left lower leg,Left arm,Front perineal area,Buttocks     Bathing assist Assist Level: Moderate Assistance - Patient 50 - 74%     Upper Body Dressing/Undressing Upper body dressing   What is the patient wearing?: Pull over shirt    Upper body assist Assist Level: Moderate Assistance - Patient 50 - 74%    Lower Body Dressing/Undressing Lower body dressing      What is the patient wearing?: Pants,Incontinence brief     Lower body assist Assist for lower body dressing: Maximal Assistance - Patient 25 - 49%     Toileting Toileting    Toileting assist Assist for toileting: 2 Helpers     Transfers Chair/bed transfer  Transfers assist     Chair/bed transfer assist level: Moderate Assistance - Patient 50 - 74%     Locomotion Ambulation   Ambulation assist   Ambulation activity did not occur: N/A  Assist level: Maximal Assistance - Patient 25 - 49% Assistive device: Hand held assist Max distance: 12'   Walk 10 feet activity   Assist  Walk 10 feet activity did not occur: Safety/medical concerns  Assist level: Moderate Assistance - Patient - 50 - 74% Assistive device: Hand held assist   Walk 50  feet activity   Assist Walk 50 feet with 2 turns activity did not occur: Safety/medical concerns  Assist level: Moderate Assistance - Patient - 50 - 74% Assistive device: Hand held assist    Walk 150 feet activity   Assist Walk 150 feet activity did not occur: Safety/medical concerns  Assist level: Total Assistance - Patient < 25% Assistive device: Lite Gait    Walk 10 feet on uneven surface  activity   Assist Walk 10 feet on uneven surfaces activity did not occur: Safety/medical concerns         Wheelchair     Assist Will patient use wheelchair at discharge?: Yes (Per PT long term goals) Type of Wheelchair: Manual            Wheelchair 50 feet with 2 turns activity    Assist    Wheelchair 50 feet with 2 turns activity did not occur: Safety/medical concerns       Wheelchair 150 feet activity     Assist  Wheelchair 150 feet activity did not occur: Safety/medical concerns       Blood pressure (!) 112/56, pulse 65, temperature 98.6 F (37 C), temperature source Oral, resp. rate 16, height 5\' 2"  (1.575 m), weight 63.5 kg, SpO2 97 %.    Medical Problem List and Plan: 1.  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  Impaired mobility:  -DVT/anticoagulation: Continue Lovenox, does have bruising             -antiplatelet therapy: Aspirin 81 mg daily and Brilinta 90 mg twice daily 3. Pain Management: Tylenol as needed  4/2-patient does not appear to be in any pain and  appears very comfortable-continue Tylenol as needed 4. Mood: Provide emotional support             -antipsychotic agents: N/A 5. Neuropsych: This patient is not capable of making decisions on her own behalf. 6. Skin/Wound Care: Routine skin checks 7. Fluids/Electrolytes/Nutrition: Routine in and outs 8.  Dysphagia.Diet upgrade to   Dysphagia #2 honey thick liquids.  Nasogastric tube d/ced given improved appetite.   Advance diet as tolerated 9.  Hypertension.  Norvasc decreased to 5 on 3/25, Cozaar 50 mg twice daily.  Monitor with increased mobility   Vitals:   11/13/20 2119 11/14/20 0356  BP: (!) 120/54 (!) 112/56  Pulse: 71 65  Resp: 18 16  Temp: 98.2 F (36.8 C) 98.6 F (37 C)  SpO2: 97% 97%  4/3-blood pressures controlled; continue regimen  10.  Seizure prophylaxis.  Keppra 500 mg twice daily.  EEG negative  Ask Neuro about Keppra duration 11.  Hyperlipidemia.   Lipitor 12.  Incidental findings meningioma.  Follow-up outpatient neurosurgery Dr. Venetia Constable 13.  Hyperglycemia related to tube feeds.  SSI  Slightly elevated on 2/27 14. global aphasia  Mainly aphasic but with good level of alertness  4/2-discussed things that family might be able to do in the future to help aphasia 15. Urinary retention:  UA negative  Urecholine 5mg  TID, increased to 10 on 3/27, increased to QID 3/28 Added flomax-started to void, spoke with daughter, anticipate good prognosis for bladder, do not anticipate long term need for bladder meds   I spent 35 minutes on care of this patient  today, discussing with the charge nurse as well as the family and reviewing the CT-more than 50% of this time was spent on coordination of care.  LOS: 13 days A FACE TO [place] 11/14/2020, 9:04 AM

## 2020-11-15 DIAGNOSIS — R4701 Aphasia: Secondary | ICD-10-CM | POA: Diagnosis not present

## 2020-11-15 DIAGNOSIS — I63512 Cerebral infarction due to unspecified occlusion or stenosis of left middle cerebral artery: Secondary | ICD-10-CM | POA: Diagnosis not present

## 2020-11-15 LAB — GLUCOSE, CAPILLARY
Glucose-Capillary: 103 mg/dL — ABNORMAL HIGH (ref 70–99)
Glucose-Capillary: 94 mg/dL (ref 70–99)
Glucose-Capillary: 108 mg/dL — ABNORMAL HIGH (ref 70–99)
Glucose-Capillary: 108 mg/dL — ABNORMAL HIGH (ref 70–99)

## 2020-11-15 LAB — CREATININE, SERUM
Creatinine, Ser: 0.7 mg/dL (ref 0.44–1.00)
GFR, Estimated: >60 mL/min (ref >60)

## 2020-11-15 NOTE — Progress Notes (Signed)
PROGRESS NOTE   Subjective/Complaints: Per neuro rec cont Keppra f/u neuro 1-2 month as OP   ROS: Limited due to being nonverbal  Objective:   No results found. No results for input(s): WBC, HGB, HCT, PLT in the last 72 hours. Recent Labs    11/15/20 0653  CREATININE 0.70    Intake/Output Summary (Last 24 hours) at 11/15/2020 0829 Last data filed at 11/15/2020 0825 Gross per 24 hour  Intake 300 ml  Output --  Net 300 ml        Physical Exam: Vital Signs Blood pressure (!) 126/50, pulse 64, temperature 97.9 F (36.6 C), resp. rate 17, height 5\' 2"  (1.575 m), weight 63.4 kg, SpO2 96 %.  General: No acute distress Mood and affect are appropriate Heart: Regular rate and rhythm no rubs murmurs or extra sounds Lungs: Clear to auscultation, breathing unlabored, no rales or wheezes Abdomen: Positive bowel sounds, soft nontender to palpation, nondistended Extremities: No clubbing, cyanosis, or edema Skin: No evidence of breakdown, no evidence of rash,  MSK- no pain with Left hip ROM , pain with palp L glut,  Motor: RUE/RLE: Appear to be 0/5 proximal distal LUE/LLE: Grossly 5/5 proximal distal Sensation grimaces with R 5th digit nailbed pinch  Global aphasia  Assessment/Plan: 1. Functional deficits which require 3+ hours per day of interdisciplinary therapy in a comprehensive inpatient rehab setting.  Physiatrist is providing close team supervision and 24 hour management of active medical problems listed below.  Physiatrist and rehab team continue to assess barriers to discharge/monitor patient progress toward functional and medical goals  Care Tool:  Bathing    Body parts bathed by patient: Chest,Abdomen,Left upper leg,Face,Right upper leg,Right arm,Right lower leg   Body parts bathed by helper: Left lower leg,Left arm,Front perineal area,Buttocks     Bathing assist Assist Level: Minimal Assistance - Patient >  75%     Upper Body Dressing/Undressing Upper body dressing   What is the patient wearing?: Pull over shirt    Upper body assist Assist Level: Minimal Assistance - Patient > 75%    Lower Body Dressing/Undressing Lower body dressing      What is the patient wearing?: Pants     Lower body assist Assist for lower body dressing: Moderate Assistance - Patient 50 - 74%     Toileting Toileting    Toileting assist Assist for toileting: 2 Helpers     Transfers Chair/bed transfer  Transfers assist     Chair/bed transfer assist level: Moderate Assistance - Patient 50 - 74% (stand pivot)     Locomotion Ambulation   Ambulation assist   Ambulation activity did not occur: N/A  Assist level: Moderate Assistance - Patient 50 - 74% Assistive device: Hand held assist Max distance: 67   Walk 10 feet activity   Assist  Walk 10 feet activity did not occur: Safety/medical concerns  Assist level: Moderate Assistance - Patient - 50 - 74% Assistive device: Hand held assist   Walk 50 feet activity   Assist Walk 50 feet with 2 turns activity did not occur: Safety/medical concerns  Assist level: Moderate Assistance - Patient - 50 - 74% Assistive device: Hand held assist  Walk 150 feet activity   Assist Walk 150 feet activity did not occur: Safety/medical concerns  Assist level: Total Assistance - Patient < 25% Assistive device: Lite Gait    Walk 10 feet on uneven surface  activity   Assist Walk 10 feet on uneven surfaces activity did not occur: Safety/medical concerns         Wheelchair     Assist Will patient use wheelchair at discharge?: Yes (Per PT long term goals) Type of Wheelchair: Manual           Wheelchair 50 feet with 2 turns activity    Assist    Wheelchair 50 feet with 2 turns activity did not occur: Safety/medical concerns       Wheelchair 150 feet activity     Assist  Wheelchair 150 feet activity did not occur:  Safety/medical concerns       Blood pressure (!) 126/50, pulse 64, temperature 97.9 F (36.6 C), resp. rate 17, height 5\' 2"  (1.575 m), weight 63.4 kg, SpO2 96 %.    Medical Problem List and Plan: 1.  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  Impaired mobility:  -DVT/anticoagulation: Continue Lovenox, does have bruising             -antiplatelet therapy: Aspirin 81 mg daily and Brilinta 90 mg twice daily 3. Pain Management: Tylenol as needed  4/2-patient does not appear to be in any pain and  appears very comfortable-continue Tylenol as needed 4. Mood: Provide emotional support             -antipsychotic agents: N/A 5. Neuropsych: This patient is not capable of making decisions on her own behalf. 6. Skin/Wound Care: Routine skin checks 7. Fluids/Electrolytes/Nutrition: Routine in and outs 8.  Dysphagia.Diet upgrade to   Dysphagia #2 honey thick liquids.  Nasogastric tube d/ced given improved appetite.   Advance diet as tolerated 9.  Hypertension.  Norvasc decreased to 5 on 3/25, Cozaar 50 mg twice daily.  Monitor with increased mobility   Vitals:   11/14/20 2003 11/15/20 0437  BP: (!) 116/44 (!) 126/50  Pulse: 70 64  Resp: 17 17  Temp: 97.7 F (36.5 C) 97.9 F (36.6 C)  SpO2: 95% 96%  4/5 controlled   10.  Seizure prophylaxis.  Keppra 500 mg twice daily.  EEG negative  per Neuro cont  Keppra, f/u neuro as OP  11.  Hyperlipidemia.  Lipitor 12.  Incidental findings meningioma.  Follow-up outpatient neurosurgery Dr. Venetia Constable 13.  Hyperglycemia related to tube feeds.  SSI  Slightly elevated on 2/27 14. global aphasia  Mainly aphasic but with good level of alertness  4/2-discussed things that family might be able to do in  the future to help aphasia 15. Urinary retention:  UA negative  Urecholine 5mg  TID, increased to 10 on 3/27, increased to QID 3/28 Added flomax-started to void, spoke with daughter, anticipate good prognosis for bladder, do not anticipate long term need for bladder meds   LOS: 14 days A FACE TO FACE EVALUATION WAS PERFORMED  Charlett Blake 11/15/2020, 8:29 AM

## 2020-11-15 NOTE — Progress Notes (Signed)
Physical Therapy Session Note  Patient Details  Name: Brittany Griffith MRN: [id number] Date of Birth: [birth year]  Today's Date: 11/15/2020 PT Individual Time: 0830-0900 PT Individual Time Calculation (min): 30 min   Short Term Goals: Week 2:  PT Short Term Goal 1 (Week 2): Patient will ambulate >13ft with MaxA +2 and LRAD PT Short Term Goal 2 (Week 2): Patient will complete bed<>wc transfers with LRAD and ModA x1 PT Short Term Goal 3 (Week 2): Patient will maintain static sitting balance >2 mins with SBA  Skilled Therapeutic Interventions/Progress Updates:    Patient in supine with spouse present.  Reports she already toileted.  Supine to sit with HOB up and rail with min A.  Patient donned pants seated EOB with max A.  Patient performed stand pivot to tilt in space w/c with min to mod A for safety.  Patient in w/c assisted to therapy gym.  Performed sit to stand to hi-lo table.  Attempted forced use of R LE with L on 4" step for standing activity placing pegs in design on peg board, but pt unable to maintain balance with R LE extensor tone.  Patient standing with both feet on floor to perform with mod A for balance and R UE/LE stabilization.  Patient in parallel bars to ambulated 2 rounds x 2 reps with mod A for weight shift and R LE progression/placement.  Patient in w/c assisted to room and left seated in w/c reclined with spouse in the room.   Therapy Documentation Precautions:  Precautions Precautions: Fall,Other (comment) Precaution Comments: global aphasia, dense R hemiparesis, R inattention Restrictions Weight Bearing Restrictions: No Pain: Pain Assessment Pain Score: 0-No pain   Therapy/Group: Individual Therapy  Reginia Naas  Magda Kiel, PT 11/15/2020, 4:19 PM

## 2020-11-15 NOTE — Progress Notes (Signed)
Occupational Therapy Session Note  Patient Details  Name: Brittany Griffith MRN: [id number] Date of Birth: [birth year]  Today's Date: 11/15/2020 OT Individual Time: 1103-1201 OT Individual Time Calculation (min): 58 min    Short Term Goals: Week 2:  OT Short Term Goal 1 (Week 2): Pt husband will demo PROM techniques OT Short Term Goal 2 (Week 2): Pt will thread LUE first into shirt wiht max multimodal cuing OT Short Term Goal 3 (Week 2): pt will thread LLE with CGA fo sitting balance only when donning pants OT Short Term Goal 4 (Week 2): Pt will compelte toilet transfer wiht MOD A stand pivot  Skilled Therapeutic Interventions/Progress Updates:    Pt up in wheelchair with interpreter present.  Took pt down to the dayroom for session with focus on LUE weigthbearing in quadriped and in sitting.  Placed foam paddle on the right hand to help with maintain digit extension.  Min assist with mod demonstrational cueing for achieving quadriped position.  Had her work on using the LUE for washing the therapy mat while maintaining balance with the RUE and max assist for sustained right elbow extension.  Transitioned to having her reach and pick up blocks as well and place in a container with the LUE from the same position as well.  Transitioned to sitting with reaching to the right and also forward with the RUE in weightbearing and min to mod assist.  Had her complete transitions from sit to squat with mod assist as well.  Finished session with transfer back to the wheelchair with min assist stand pivot and then back to the room via wheelchair.  She transferred to the bed at the same level and then to supine.  NMES place on the right shoulder at end of session with intensity at level 7.  She was able to tolerate for 60 mins without any adverse reactions.  See below for parameters.  Saebo Stim One 330 pulse width 35 Hz pulse rate On 8 sec/ off 8 sec Ramp up/ down 2 sec Symmetrical Biphasic wave form  Max  intensity 134mA at 500 Ohm load   Therapy Documentation Precautions:  Precautions Precautions: Fall,Other (comment) Precaution Comments: global aphasia, dense R hemiparesis, R inattention Restrictions Weight Bearing Restrictions: No  Pain: Pain Assessment Pain Scale: Faces Pain Score: 0-No pain ADL: See Care Tool Section for some details of mobility and selfcare  Therapy/Group: Individual Therapy  Cesilia Shinn OTR/L 11/15/2020, 12:42 PM

## 2020-11-15 NOTE — Progress Notes (Signed)
Physical Therapy Session Note  Patient Details  Name: Brittany Griffith MRN: [id number] Date of Birth: 1951-03-22  Today's Date: 11/15/2020 PT Individual Time: 0945-1040 PT Individual Time Calculation (min): 55 min   Short Term Goals: Week 2:  PT Short Term Goal 1 (Week 2): Patient will ambulate >74ft with MaxA +2 and LRAD PT Short Term Goal 2 (Week 2): Patient will complete bed<>wc transfers with LRAD and ModA x1 PT Short Term Goal 3 (Week 2): Patient will maintain static sitting balance >2 mins with SBA  Skilled Therapeutic Interventions/Progress Updates:    Patient received supine in bed, agreeable to PT. She denies pain. Interpretor present partway through therapy session. She was able to come sit edge of bed with CGA and verbal cues to attend to RUE placement. MinA stand pivot to standard wc. PT transporting patient in wc to therapy gym for time management and energy conservation. She was able to ambulate 49ft with RW and ModA, increased assist mainly due to PT need to assist with RW. Patient ambulating additional 50ft x2 with CGA/MinA with seated rest break in between. Patient able to ambulate at typical gait speed for age with 1 HHA. Patient taking equal step length B, but continues to demonstrate decreased R knee flexion through swing phase. Patient able to initiate saying "yes," "no," "one," and "two" throughout session. She negotiated 4 steps with R HR and MinA. She self-selected reciprocal stepping when ascending and step-to when descending. Seated R UE NMR pushing stool away- patient able to initiate proximally, but had difficulty completing full motion to push stool away. Patient also recruiting LE and trunk to assist in pushing stool forward. Patient returning to room in standard wc, seatbelt alarm on, call light within reach.  Therapy Documentation Precautions:  Precautions Precautions: Fall,Other (comment) Precaution Comments: global aphasia, dense R hemiparesis, R  inattention Restrictions Weight Bearing Restrictions: No    Therapy/Group: Individual Therapy  Karoline Caldwell, PT, DPT, CBIS  11/15/2020, 7:47 AM

## 2020-11-15 NOTE — Plan of Care (Signed)
  Problem: Consults Goal: RH STROKE PATIENT EDUCATION Description: See Patient Education module for education specifics  Outcome: Progressing   Problem: RH BOWEL ELIMINATION Goal: RH STG MANAGE BOWEL WITH ASSISTANCE Description: STG Manage Bowel with mod I Assistance. Outcome: Progressing   Problem: RH BLADDER ELIMINATION Goal: RH STG MANAGE BLADDER WITH ASSISTANCE Description: STG Manage Bladder With  mod I Assistance Outcome: Progressing   Problem: RH SAFETY Goal: RH STG ADHERE TO SAFETY PRECAUTIONS W/ASSISTANCE/DEVICE Description: STG Adhere to Safety Precautions With cues/reminders Assistance/Device. Outcome: Progressing   Problem: RH KNOWLEDGE DEFICIT Goal: RH STG INCREASE KNOWLEDGE OF HYPERTENSION Description: Patient and spouse/dtr will be able to manage HTN with medications and dietary modifications using handouts and educational materials independently Outcome: Progressing Goal: RH STG INCREASE KNOWLEDGE OF DYSPHAGIA/FLUID INTAKE Description: Patient and spouse/dtr will be able to manage dysphagia /dietary modifications using handouts and educational materials independently Outcome: Progressing Goal: RH STG INCREASE KNOWLEGDE OF HYPERLIPIDEMIA Description: Patient and spouse/dtr will be able to manage HLD with medications and dietary modifications using handouts and educational materials independently Outcome: Progressing Goal: RH STG INCREASE KNOWLEDGE OF STROKE PROPHYLAXIS Description: Patient and spouse/dtr will be able to manage secondary stroke risks with medications and dietary modifications and stroke prophylaxis using handouts and educational materials independently Outcome: Progressing

## 2020-11-15 NOTE — Progress Notes (Signed)
Speech Language Pathology Weekly Progress and Session Note  Patient Details  Name: Brittany Griffith MRN: [id number] Date of Birth: Nov 01, 1950  Beginning of progress report period: November 09, 2020 End of progress report period: November 15, 2020  Today's Date: 11/15/2020 SLP Individual Time: 3094-0768 SLP Individual Time Calculation (min): 27 min  Short Term Goals: Week 2: SLP Short Term Goal 1 (Week 2): Pt will consume Dys 2 texture trials with minimal right buccal pocketing and no more than Moderate cues required to clear X3 prior to upgrade. SLP Short Term Goal 1 - Progress (Week 2): Met SLP Short Term Goal 2 (Week 2): Pt will consume therapeutic trials of ice and or thin H2O via spoon without decline in respiratory status or other vitals across 1 week prior to repeat MBSS. SLP Short Term Goal 2 - Progress (Week 2): Met SLP Short Term Goal 3 (Week 2): Pt will voice on command in 25% of opportunities with Max A multimodal cueing. SLP Short Term Goal 3 - Progress (Week 2): Met SLP Short Term Goal 4 (Week 2): Pt will imitate oral motor commands with 50% accuracy with Max A multimodal cueing. SLP Short Term Goal 4 - Progress (Week 2): Met SLP Short Term Goal 5 (Week 2): Pt will communicate basic wants and needs via gestures and/or communication board with 75% accuracy with Max A multimodal cueing. SLP Short Term Goal 5 - Progress (Week 2): Met    New Short Term Goals: Week 3: SLP Short Term Goal 1 (Week 3): STG=LTG due to short length of stay (d/c 4/14)  Weekly Progress Updates: Pt made great progress meeting 5 out 5 goals this reporting period. Pt is tolerating water protocol and plans for repeat MBS on 4/7. Pt demonstrates increase ability to follow oral motor commands, spontaneous speech at word level, automatic speech especially in singing tasks, appropriate response to yes/no questions and following 1 step commands. SLP will focus on a functional communication system, tolerance of thin liquid  trials, repeat MBS and continued vocalizing. Pt would continue to benefit from skilled ST services in order to maximize functional independence and reduce burden of care, requiring 24 hour supervision at discharge with continued skilled ST services.      Intensity: Minumum of 1-2 x/day, 30 to 90 minutes Frequency: 3 to 5 out of 7 days Duration/Length of Stay: 4/14 Treatment/Interventions: Cognitive remediation/compensation;Cueing hierarchy;Dysphagia/aspiration precaution training;Functional tasks;Patient/family education;Therapeutic Activities;Speech/Language facilitation;Multimodal communication approach;Internal/external aids   Daily Session  Skilled Therapeutic Interventions: Skilled ST services focused on speech and swallow skills. SLP facilitated automatic language in singing and counting tasks. Pt was able to to voice up to 16 word approximations while singing in unison after x3 trials and produced "day"and "to" independently when SLP/husband faded out. Pt was able produced "john" for "johnny" (husbands name) x1 with inability to repeat. Pt demonstrated ability to follow 1 step commands for oral care with supervision A verbal cues, however immediate after during trials of thin liquids pt perseverated on swishing liquids and required administration of TSP verse cup sips to stop perseveration. Pt demonstrated mild right anterior spillage, swallow appeared timely and no overt aspiration on 4oz of thin liquids. Pt was left in room with husband, call bell within reach and chair alarm set. SLP recommends to continue skilled services.     General    Pain Pain Assessment Pain Scale: 0-10 Pain Score: 0-No pain  Therapy/Group: Individual Therapy  [name]  [facility] 11/15/2020, 9:35 AM

## 2020-11-15 NOTE — Progress Notes (Signed)
Nutrition Follow-up  DOCUMENTATION CODES:   Not applicable  INTERVENTION:   - Continue Magic Cup TID with meals, each supplement provides 290 kcal and 9 grams of protein  - [place] yogurt TID with meals  - Encourage continued adequate PO intake  - Family to continue to bring in diet-compliant foods from home  NUTRITION DIAGNOSIS:   Increased nutrient needs related to other (extensive therapies) as evidenced by estimated needs.  Ongoing, being addressed via supplements  GOAL:   Patient will meet greater than or equal to 90% of their needs  Progressing  MONITOR:   PO intake,Supplement acceptance,Diet advancement,Weight trends,Labs  REASON FOR ASSESSMENT:   Consult Calorie Count,Enteral/tube feeding initiation and management,Assessment of nutrition requirement/status  ASSESSMENT:   70 year old female with PMH of HLD and HTN. Presented 10/23/20 with right-sided weakness, headache, and slurred speech. 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 Pt underwent cerebral angiogram 10/26/20 followed by stent assisted angioplasty of left MCA with revascularization per IR. Pt currently on a dysphagia 1 diet with honey thick liquids and Cortrak in place for nutrition support. Admitted to CIR on 11/01/20.  3/24 - Cortrak removed 3/31 - diet advanced to dysphagia 2 with honey-thick liquids  Noted plan for repeat MBS on 4/07.  Discussed pt with RN who reports family brings in outside food and pt prefers consuming this food instead of the food on her meal trays.  Spoke with pt and husband at bedside. Pt's husband reports that pt is eating fairly well. He states that there is "too much food" on her meal trays. RD will remove double protein portions. Pt's husband reports that pt likes yogurt. RD will order [place] yogurt TID with meals.  Admit weight: 61.1 kg Current weight: 63.4 kg  Weight stable.  Meal Completion: 10-75%  Medications reviewed and include: SSI, miralax, senna  Labs reviewed.  CBG's: 83-158 x 24 hours  Diet Order:   Diet Order            DIET DYS 2 Room service appropriate? Yes; Fluid consistency: Honey Thick  Diet effective now                 EDUCATION NEEDS:   No education needs have been identified at this time  Skin:  Skin Assessment: Reviewed RN Assessment  Last BM:  11/05/20  Height:   Ht Readings from Last 1 Encounters:  11/02/20 5\' 2"  (1.575 m)    Weight:   Wt Readings from Last 1 Encounters:  11/15/20 63.4 kg    BMI:  Body mass index is 25.56 kg/m.  Estimated Nutritional Needs:   Kcal:  1700-1900  Protein:  85-100 grams  Fluid:  1.7-1.9 L    [name], MS, RD, LDN Inpatient Clinical Dietitian Please see [facility] for contact information.

## 2020-11-16 DIAGNOSIS — R4701 Aphasia: Secondary | ICD-10-CM | POA: Diagnosis not present

## 2020-11-16 DIAGNOSIS — I63512 Cerebral infarction due to unspecified occlusion or stenosis of left middle cerebral artery: Secondary | ICD-10-CM | POA: Diagnosis not present

## 2020-11-16 LAB — GLUCOSE, CAPILLARY
Glucose-Capillary: 112 mg/dL — ABNORMAL HIGH (ref 70–99)
Glucose-Capillary: 121 mg/dL — ABNORMAL HIGH (ref 70–99)
Glucose-Capillary: 113 mg/dL — ABNORMAL HIGH (ref 70–99)
Glucose-Capillary: 134 mg/dL — ABNORMAL HIGH (ref 70–99)

## 2020-11-16 MED ORDER — BETHANECHOL CHLORIDE 10 MG PO TABS
10.0000 mg | ORAL_TABLET | Freq: Four times a day (QID) | ORAL | Status: DC
  Administered 2020-11-16 – 2020-11-19 (×15): 10 mg via ORAL
  Filled 2020-11-16 (×16): qty 1

## 2020-11-16 NOTE — Progress Notes (Signed)
Occupational Therapy Weekly Progress Note  Patient Details  Name: Brittany Griffith MRN: [id number] Date of Birth: 1951/06/20  Beginning of progress report period: November 10, 2020 End of progress report period: November 16, 2020  Today's Date: 11/16/2020 OT Individual Time: 9480-1655 OT Individual Time Calculation (min): 59 min    Patient has met 4 of 4 short term goals.  Pt is making steady progress with OT at this time and currently completes UB selfcare with min assist level.  She is able to perform LB bathing with min assist and the LB dressing with mod assist including TEDs and gripper socks.  She completes stand pivot transfers with min assist to the 3:1 and the tub bench.  She continues with global aphasia, but continues to demonstrate some improvement with following one step instructional commands for selfcare tasks.  Overall, she still needs mod demonstrational cueing for initiation and sequencing of dressing tasks following hemi techniques.  RLE continues to show improvement with functional movement where as the UE is still at [facility] stage II in the arm with stage I in the hand.  She needs max demonstrational cueing for attention to it for positioning in her lap or the half lap tray.  Max hand over hand initiation is needed for all functional tasks.  Feel she is on target for established LTGs at min to supervision.  Discharged day moved up to 4/12 based on progress and wishes to leave early.  Recommend continued CIR level therapy until discharge.    Patient continues to demonstrate the following deficits: muscle weakness and muscle paralysis, impaired timing and sequencing, unbalanced muscle activation and decreased coordination, decreased attention to right, decreased attention, decreased awareness, decreased problem solving, decreased safety awareness and delayed processing and decreased sitting balance, decreased standing balance, decreased postural control, hemiplegia and decreased balance  strategies and therefore will continue to benefit from skilled OT intervention to enhance overall performance with BADL and Reduce care partner burden.  Patient progressing toward long term goals..  Continue plan of care.  OT Short Term Goals Week 3:  OT Short Term Goal 1 (Week 3): Continue working on established LTGs set at supervision to min assist.  Skilled Therapeutic Interventions/Progress Updates:    Pt's spouse and their daughter present for session.  Worked on family education with hands on practice with toilet transfers, wheelchair to chair transfers, and tub transfers with use of the tub bench.  Both spouse and daughter were able to demonstrate safe assist with all transfers stand pivot at min assist level.  Pt with better understanding this session with regards to simple questioning with more accurate head nods yes/no.  Finished session with placement of NMES to the right shoulder for facilitation of movement and treatment of subluxation.  She tolerated  Intensity on level 7 for 60 mins without any adverse reactions at the parameters below.   Saebo Stim One 330 pulse width 35 Hz pulse rate On 8 sec/ off 8 sec Ramp up/ down 2 sec Symmetrical Biphasic wave form  Max intensity 129m at 500 Ohm load   Therapy Documentation Precautions:  Precautions Precautions: Fall,Other (comment) Precaution Comments: global aphasia, dense R hemiparesis, R inattention Restrictions Weight Bearing Restrictions: No  Pain: Pain Assessment Pain Scale: Faces Pain Score: 0-No pain ADL: See Care Tool Section for some details of mobility and selfcare  Therapy/Group: Individual Therapy  Latoria Dry OTR/L 11/16/2020, 12:15 PM

## 2020-11-16 NOTE — Progress Notes (Signed)
PROGRESS NOTE   Subjective/Complaints: Per neuro rec cont Keppra f/u neuro 1-2 month as OP - discussed with pt and husband in presence of interpreter   ROS: Limited due to being nonverbal/aphasic   Objective:   No results found. No results for input(s): WBC, HGB, HCT, PLT in the last 72 hours. Recent Labs    11/15/20 0653  CREATININE 0.70    Intake/Output Summary (Last 24 hours) at 11/16/2020 0920 Last data filed at 11/15/2020 1803 Gross per 24 hour  Intake 180 ml  Output --  Net 180 ml        Physical Exam: Vital Signs Blood pressure (!) 105/55, pulse 67, temperature 98.1 F (36.7 C), temperature source Oral, resp. rate 16, height 5' 2"  (1.575 m), weight 59.1 kg, SpO2 96 %.   General: No acute distress Mood and affect are appropriate Heart: Regular rate and rhythm no rubs murmurs or extra sounds Lungs: Clear to auscultation, breathing unlabored, no rales or wheezes Abdomen: Positive bowel sounds, soft nontender to palpation, nondistended Extremities: No clubbing, cyanosis, or edema Skin: No evidence of breakdown, no evidence of rash  Motor: RUE/Appear to be 0/5 proximal distal 3- RLE  LUE/LLE: Grossly 5/5 proximal distal Sensation grimaces with R 5th digit nailbed pinch  Global aphasia  Assessment/Plan: 1. Functional deficits which require 3+ hours per day of interdisciplinary therapy in a comprehensive inpatient rehab setting.  Physiatrist is providing close team supervision and 24 hour management of active medical problems listed below.  Physiatrist and rehab team continue to assess barriers to discharge/monitor patient progress toward functional and medical goals  Care Tool:  Bathing    Body parts bathed by patient: Chest,Abdomen,Left upper leg,Face,Right upper leg,Right arm,Right lower leg   Body parts bathed by helper: Left lower leg,Left arm,Front perineal area,Buttocks     Bathing assist Assist  Level: Minimal Assistance - Patient > 75%     Upper Body Dressing/Undressing Upper body dressing   What is the patient wearing?: Pull over shirt    Upper body assist Assist Level: Minimal Assistance - Patient > 75%    Lower Body Dressing/Undressing Lower body dressing      What is the patient wearing?: Pants     Lower body assist Assist for lower body dressing: Moderate Assistance - Patient 50 - 74%     Toileting Toileting    Toileting assist Assist for toileting: 2 Helpers     Transfers Chair/bed transfer  Transfers assist     Chair/bed transfer assist level: Minimal Assistance - Patient > 75% (stand pivot)     Locomotion Ambulation   Ambulation assist   Ambulation activity did not occur: N/A  Assist level: Minimal Assistance - Patient > 75% Assistive device: Hand held assist Max distance: 57   Walk 10 feet activity   Assist  Walk 10 feet activity did not occur: Safety/medical concerns  Assist level: Minimal Assistance - Patient > 75% Assistive device: Hand held assist   Walk 50 feet activity   Assist Walk 50 feet with 2 turns activity did not occur: Safety/medical concerns  Assist level: Minimal Assistance - Patient > 75% Assistive device: Hand held assist    Walk  150 feet activity   Assist Walk 150 feet activity did not occur: Safety/medical concerns  Assist level: Total Assistance - Patient < 25% Assistive device: Lite Gait    Walk 10 feet on uneven surface  activity   Assist Walk 10 feet on uneven surfaces activity did not occur: Safety/medical concerns         Wheelchair     Assist Will patient use wheelchair at discharge?: Yes (Per PT long term goals) Type of Wheelchair: Manual           Wheelchair 50 feet with 2 turns activity    Assist    Wheelchair 50 feet with 2 turns activity did not occur: Safety/medical concerns       Wheelchair 150 feet activity     Assist  Wheelchair 150 feet activity did  not occur: Safety/medical concerns       Blood pressure (!) 105/55, pulse 67, temperature 98.1 F (36.7 C), temperature source Oral, resp. rate 16, height 5' 2"  (1.575 m), weight 59.1 kg, SpO2 96 %.    Medical Problem List and Plan: 1.  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 Formulized individual treatment plan based on medical history, underlying problem and comorbidities.  DIscussed prognosis with husband , expect limited improvement with UE but continued good improvement with RLE   2.  Impaired mobility:  -DVT/anticoagulation: Continue Lovenox, does have bruising             -antiplatelet therapy: Aspirin 81 mg daily and Brilinta 90 mg twice daily 3. Pain Management: Tylenol as needed  4/2-patient does not appear to be in any pain and  appears very comfortable-continue Tylenol as needed 4. Mood: Provide emotional support             -antipsychotic agents: N/A 5. Neuropsych: This patient is not capable of making decisions on her own behalf. 6. Skin/Wound Care: Routine skin checks 7. Fluids/Electrolytes/Nutrition: Routine in and outs 8.  Dysphagia.Diet upgrade to   Dysphagia #2 honey thick liquids.  Nasogastric tube d/ced given improved appetite.   Advance diet as tolerated 9.  Hypertension.  Norvasc decreased to 5 on 3/25, Cozaar 50 mg twice daily.  Monitor with increased mobility   Vitals:   11/15/20 1935 11/16/20 0404  BP: (!) 136/54 (!) 105/55  Pulse: 65 67  Resp: 14 16  Temp: 98.5 F (36.9 C) 98.1 F (36.7 C)  SpO2: 97% 96%  4/5 controlled   10.  Seizure prophylaxis.  Keppra 500 mg twice daily.  EEG  negative  per Neuro cont  Keppra, f/u neuro as OP  11.  Hyperlipidemia.  Lipitor 12.  Incidental findings meningioma.  Follow-up outpatient neurosurgery Dr. Venetia Constable 13.  Hyperglycemia related to tube feeds.  SSI  Slightly elevated on 2/27 14. global aphasia  Mainly aphasic but with good level of alertness  4/2-discussed things that family might be able to do in the future to help aphasia 15. Urinary retention:  UA negative  Urecholine 50m TID, increased to 10 on 3/27, increased to QID 3/28 Added flomax-started to void, spoke with daughter, anticipate good prognosis for bladder, do not anticipate long term need for bladder meds  LOS: 15 days A FACE TO [place] E Alisandra Son 11/16/2020, 9:20 AM

## 2020-11-16 NOTE — Patient Care Conference (Signed)
Inpatient RehabilitationTeam Conference and Plan of Care Update Date: 11/16/2020   Time: 10:36 AM    Patient Name: Brittany Griffith      Medical Record Number: [id number]  Date of Birth: 1950-12-04 Sex: Female         Room/Bed: 4W26C/4W26C-01 Payor Info: Payor: [employer] MEDICARE / Plan: [employer] MEDICARE HMO/PPO / Product Type: *No Product type* /    Admit Date/Time:  11/01/2020  2:44 PM  Primary Diagnosis:  Left middle cerebral artery stroke [facility])  Hospital Problems: Principal Problem:   Left middle cerebral artery stroke [facility]) Active Problems:   left MCA stroke   Meningioma (HCC)   Cerebral edema ([place])   Global aphasia   Hypertensive urgency   Dysphagia   Right hemiplegia ([place])   Urinary retention   Hyperglycemia   Essential hypertension   Dysphagia, post-stroke    Expected Discharge Date: Expected Discharge Date: 11/22/20  Team Members Present: Physician leading conference: Dr. Alysia Penna Care Coodinator Present: Dorien Chihuahua, RN, BSN, CRRN;Christina Paige, BSW Nurse Present: Dorien Chihuahua, RN PT Present: Estevan Ryder, PT OT Present: Clyda Greener, OT SLP Present: Junius Argyle, SLP PPS Coordinator present : Gunnar Fusi, SLP     Current Status/Progress Goal Weekly Team Focus  Bowel/Bladder   Continent B/B with occassional urinary incontinence LBM 4/5  Remain fully continent B/B  Assess PRN and offer timed toileting   Swallow/Nutrition/ Hydration   Mod-min A  Min A  MBS 4/7   ADL's   Min assist for UB bathing with min to mod for LB.  Min assist for donning pullover shirt with mod for LB dressing.  Transfers are currently min assist stand pivot.  Still with global aphasia and inconsistency with understanding and following one step instructional commands.  Brunnstrum stage II in the right arm and I in the hand.  min assist level overall  selfcare retraining, NMES, neuromuscular re-education, balance retraining, DME education, pt/family education   Mobility    CGA/MinA bed mobility, CGA STS, CGA/ MinA stand pivot, CGA/MinA gait up to 64ft with no AD or ModA with RW, MinA x4 stairs 1 HR, in standard chair now  grossly MinA, may have to be upgraded  transfers, gait, R hemi NMR, endurance, stairs   Communication   Mod-mIn A recpetive, Max A verbal  Min A-Mod A  communictaion board, vocing   Safety/Cognition/ Behavioral Observations  Mod A  Min A  emergent awareness   Pain   Denies  Pain<3  assess q shift and PRN   Skin   ecchymosis to abdomen  no new breakdowns  assess q shift and PRN     Discharge Planning:  Pt discharging home with spouse and son to provide care 24/7daughter returning home. 1 level home 1 step to enter   Team Discussion: Limited strength return in right upper extremity and good return of lower extremity function. Bladder working better; continent and MD weaning medications for retention. Left hip pain improved.   Patient on target to meet rehab goals: yes, currently min assist for transfers, and lower body care. Min - mod assist for clothes management. CGA for sit-stand pivot, ambulated 2' without walker. Requires mod assist with walker coordination. Min assist for steps. Min assist goals set for discharge.  *See Care Plan and progress notes for long and short-term goals.   Revisions to Treatment Plan:   Teaching Needs: Transfers, toileting, medications, etc.  Current Barriers to Discharge: Decreased caregiver support and Home enviroment access/layout  Possible Resolutions to Barriers:  Family education    Medical Summary Current Status: severe exp aphasia, hip pain improved, urinary retention improved  Barriers to Discharge: Medical stability;Other (comments)  Barriers to Discharge Comments: aphasia Possible Resolutions to Barriers/Weekly Focus: wean bladder meds, functional communication , family training   Continued Need for Acute Rehabilitation Level of Care: The patient requires daily medical management by a  physician with specialized training in physical medicine and rehabilitation for the following reasons: Direction of a multidisciplinary physical rehabilitation program to maximize functional independence : Yes Medical management of patient stability for increased activity during participation in an intensive rehabilitation regime.: Yes Analysis of laboratory values and/or radiology reports with any subsequent need for medication adjustment and/or medical intervention. : Yes   I attest that I was present, lead the team conference, and concur with the assessment and plan of the team.   Dorien Chihuahua B 11/16/2020, 5:15 PM

## 2020-11-16 NOTE — Progress Notes (Addendum)
Speech Language Pathology Daily Session Note  Patient Details  Name: Brittany Griffith MRN: [id number] Date of Birth: 1951/05/30  Today's Date: 11/16/2020 SLP Individual Time: 4562-5638 SLP Individual Time Calculation (min): 42 min  Short Term Goals: Week 3: SLP Short Term Goal 1 (Week 3): STG=LTG due to short length of stay (d/c 4/14)  Skilled Therapeutic Interventions:Skilled ST services focused on swallow and speech skills. SLP facilitated receptive language skills on identifying pictures on communication board. Pt was able to identify pictures in a field 3 fading to supervision A verbal cues to scan right, then in a field of 4 with min A semantic cues for accuracy and in a field of 5 with 80% accuracy with mod A semantic cues. SP created 5 picture communication board, posted in room. Pt was able to express initial /b/ CV words with picture cards, pt was able to repeat 2 CV words on 1 occurrence each and vowel sounds in other two CV words with repetition and visual cues. Pt was able to produce "ohnny" in "johnny" with repetition and visual cues x5. SLP provided oral care prior to thin liquid trials, pt consumed 4oz of thin liquids via cup sip. Pt demonstrated timely swallow with x1 immediate cough after consecutive sips only. SLP is planning MBS tomorrow. Pt was left in room with husband, call bell within reach and bed alarm set. SLP recommends to continue skilled services.     Pain Pain Assessment Pain Scale: Faces Pain Score: 0-No pain  Therapy/Group: Individual Therapy  Anishka Bushard  [facility] 11/16/2020, 12:18 PM

## 2020-11-16 NOTE — Progress Notes (Signed)
Physical Therapy Weekly Progress Note  Patient Details  Name: Brittany Griffith MRN: [id number] Date of Birth: 1951/02/19  Beginning of progress report period: November 09, 2020 End of progress report period: November 16, 2020  Today's Date: 11/16/2020 PT Individual Time: 7425-9563 PT Individual Time Calculation (min): 70 min   Patient has met 3 of 3 short term goals.  Brittany Griffith is progressing well with therapy increasing her independence with functional mobility. She continues to demonstrate significant R inattention, impaired safety awareness, R hemiparesis (UE>LE), and impaired balance. She is performing supine<>sit with CGA/min assist using bed features, sit<>stands and stand pivot transfers with min assist, gait up to 134f with R HHA and mod assist as well as stair navigation using handrails with min assist.   Patient continues to demonstrate the following deficits muscle weakness, muscle joint tightness and muscle paralysis, decreased cardiorespiratoy endurance, impaired timing and sequencing, abnormal tone, unbalanced muscle activation and decreased motor planning, decreased attention to right, decreased attention, decreased awareness, decreased problem solving, decreased safety awareness, decreased memory and delayed processing and decreased sitting balance, decreased standing balance, decreased postural control, hemiplegia and decreased balance strategies and therefore will continue to benefit from skilled PT intervention to increase functional independence with mobility.  Patient progressing toward long term goals..  Continue plan of care.  PT Short Term Goals Week 2:  PT Short Term Goal 1 (Week 2): Patient will ambulate >11fwith MaxA +2 and LRAD PT Short Term Goal 1 - Progress (Week 2): Met PT Short Term Goal 2 (Week 2): Patient will complete bed<>wc transfers with LRAD and ModA x1 PT Short Term Goal 2 - Progress (Week 2): Met PT Short Term Goal 3 (Week 2): Patient will maintain static  sitting balance >2 mins with SBA PT Short Term Goal 3 - Progress (Week 2): Met Week 3:  PT Short Term Goal 1 (Week 3): = to LTGs based on ELOS  Skilled Therapeutic Interventions/Progress Updates:  Ambulation/gait training;Community reintegration;DME/adaptive equipment instruction;Neuromuscular re-education;Psychosocial support;Stair training;UE/LE Strength taining/ROM;Wheelchair propulsion/positioning;Discharge planning;Balance/vestibular training;Functional electrical stimulation;Pain management;Skin care/wound management;Therapeutic Activities;UE/LE Coordination activities;Cognitive remediation/compensation;Disease management/prevention;Functional mobility training;Patient/family education;Splinting/orthotics;Therapeutic Exercise;Visual/perceptual remediation/compensation   Pt received supine in bed with her husband and in-person interpreter, [name], present and pt appears agreeable to therapy session - no verbal responses during today's session. Supine>sitting L EOB, HOB partially elevated and relying heavily on L UE support on bedrail for trunk upright, with supervision. Sitting EOB with L UE support for trunk control/sitting balance during max assist LB clothing management and donning shoes. Pt demos impaired safety awareness and impulsivity throughout session with cuing for safety. Sit>stand from EOB using L UE support on bedrail and starting to perform R stand pivot transfer to w/c prior to pulling pants up over hips - required cuing to pause and total assist to pull up pants - completed transfer with heavy min assist for balance.  Transported to/from gym in w/c for time management and energy conservation. Sit<>stands with min assist for balance throughout session due to R lateral lean - cuing for awareness and allowing time for motor planning to achieve midline orientation. Gait training ~1105fith R HHA and min/mod assist for balance due to R lateral and anterior trunk lean - pt able to advance R LE  during swing without assist but with decreased hip/knee flexion for foot clearance and pt intermittently catching toes that worsens with fatigue causing worsening anterior trunk lean/LOB. Dynamic gait training via R/L lateral side stepping at hallway rail for safety but no UE support (~  78f down/back) with mod assist for balance due to R lateral lean and tactile facilitation for R/L weight shifting and increased R hip abductor activation to increase R lateral step length - demos lack of R hip/knee flexion to lift foot fully from ground when stepping. Added agility ladder for small external target to improve foot clearance and repeated R/L lateral steps with mod assist and no UE support with pt demonstrating increased R hip/knee flexion to clear foot when stepping laterally - down/back x2. Attempted forward ambulation through agility ladder but pt with difficulty motor planning/comprehending task to step 1 foot per square (steps may have been too large for height and leg length of pt) therefore discontinued.   Dynamic standing balance tasks: - repeated R LE lateral foot taps on/off 6" step with mirror feedback and mod progressed to min assist for balance targeting sustained L lateral weight shift while stepping with R LE - transitioned to forward R LE foot taps on/off 6" step with mirror feedback with min/light mod assist for balance - attempted to provide external target to increase width of BOS when stepping R LE backwards off of step but difficulty comprehending this instruction and possibly confused due to mirror feedback Continues to have repeated R lateral lean/LOB throughout with minimal improvement noted during session. Continues to demonstrate R inattention requiring max cuing to attend to stimulus on that side.  Sitting on mat>tall kneeling on mat with UE support on step and mod assist for balance. In tall kneeling with R UE WBing on step for NMR during cross body reaching task to promote R  attention/visual scanning and trunk/hip control and dynamic balance - required CGA/min assist during. Tall kneeling>sitting on EOM with +2 min assist for safety due to pt's slight impulsivity. R stand pivot transfer EOM>w/c, no AD, with min assist for balance.  Transported back to room in w/c. Pt's husband and daughter present - reinforced education regarding engaging pt in conversations and tasks to promote R attention. Pt left sitting in w/c with needs in reach, R UE supported on 1/2 lap tray, seat belt alarm on, and pt's family present.     Therapy Documentation Precautions:  Precautions Precautions: Fall,Other (comment) Precaution Comments: global aphasia, dense R hemiparesis, R inattention Restrictions Weight Bearing Restrictions: No  Pain:   No grimacing or other indication of pain during session.  Therapy/Group: Individual Therapy  CTawana Scale, PT, DPT, CSRS  11/16/2020, 7:47 AM

## 2020-11-16 NOTE — Progress Notes (Addendum)
Patient ID: Brittany Griffith, female   DOB: Sep 12, 1950, 70 y.o.   MRN: [id number]  Team Conference Report to Patient/Family  Team Conference discussion was reviewed with the patient and caregiver, including goals, any changes in plan of care and target discharge date.  Patient and caregiver express understanding and are in agreement.  The patient has a target discharge date of 11/22/20.  SW met with pt, daughter and spouse in room. Pt daughter informed of pt d/c date being moved up, inquiring it pt can d/c on Friday. Sw waiting on pt recc from therapy. Pt dtr requesting OP. Sw will update dtr pending family education. Family education scheduled Friday 4/8 and Monday 4/11 9-12. SW will follow  Up with pt dtr on recommendations.  [place], [place]  Dyanne Iha 11/16/2020, 1:49 PM

## 2020-11-17 ENCOUNTER — Inpatient Hospital Stay (HOSPITAL_COMMUNITY): Payer: Medicare HMO

## 2020-11-17 DIAGNOSIS — R4701 Aphasia: Secondary | ICD-10-CM | POA: Diagnosis not present

## 2020-11-17 DIAGNOSIS — I63512 Cerebral infarction due to unspecified occlusion or stenosis of left middle cerebral artery: Secondary | ICD-10-CM | POA: Diagnosis not present

## 2020-11-17 LAB — GLUCOSE, CAPILLARY
Glucose-Capillary: 98 mg/dL (ref 70–99)
Glucose-Capillary: 104 mg/dL — ABNORMAL HIGH (ref 70–99)
Glucose-Capillary: 103 mg/dL — ABNORMAL HIGH (ref 70–99)
Glucose-Capillary: 135 mg/dL — ABNORMAL HIGH (ref 70–99)

## 2020-11-17 NOTE — Progress Notes (Addendum)
Physical Therapy Session Note  Patient Details  Name: Brittany Griffith MRN: [id number] Date of Birth: [birth year]  Today's Date: 11/17/2020 PT Individual Time: 4270-6237; 1100-1155 PT Individual Time Calculation (min): 29 min; 55 mins  Short Term Goals: Week 3:  PT Short Term Goal 1 (Week 3): = to LTGs based on ELOS  Skilled Therapeutic Interventions/Progress Updates:    session 1: Patient received sitting up in bed, finishing breakfast with husband present providing assist. She was agreeable to PT and denied pain. Husband reporting for patient that she didn't sleep well last night due to stomach pain, but reports that it's better this morning. Message passed along to MD during AM rounds. She was able to don pants with MaxA and husband providing assist as needed. She came to sit edge of bed with supervision and HOB elevated. CGA sit > stand to finish donning pants and transfer to wc via stand pivot. PT transporting patient in wc to therapy gym for time management and energy conservation. She was able to complete standing NMR task with R UE weightbearing on table to use L UE to place pegs in the board. She was unable to follow instructions to complete task based off of picture, but was able to self-correct how to place peg into board once shown. Patient returning to room in wc, transferring back to bed ambulatory transfer MinA for MBS transport. Bed alarm on, call light within reach, RN at bedside.    Session 2: Patient received sitting up in wc, agreeable to PT. She denies pain, interpretor present. PT transporting patient in wc to therapy gym for time management and energy conservation. She was able to ambulate 134ft x3 with seated rest break in between. 2# ankle weight R LE for improved proprioceptive input. 70ft with L hallway handrail assist + CGA, 63ft with no handrail assist + MinA. With fatigue, patient develops stronger R lateral lean, but is receptive to cues to correct posture and assume  midline. Standing balance/endurance task on solid ground, mirror for feedback on posture placing pegs in board reaching across midline to retrieve them. Patient maintaining midline appropriately and able to attend to right side sufficiently to retrieve pegs. Patient completing same task standing on foam with appropriately ankle strategy noted in B LE to assist in maintaining balance. Patient using B LE to propel wc for improved hamstring activation in R LE to assist with knee flexion during swing phase of gait. x34ft with CGA and Mod verbal cues to use R LE.  Patient completing Kinetron at 30cm/s 3x2' with good activation of R hip extensors. Patient ambulating back to her room with CGA/MinA, no AD and wc follow for convenience. Patient able to achieve appropriate R foot clearance and minimal evidence of R lateral lean. Patient returning to bed per husband request. Bed alarm on, call light within reach.    Therapy Documentation Precautions:  Precautions Precautions: Fall,Other (comment) Precaution Comments: global aphasia, dense R hemiparesis, R inattention Restrictions Weight Bearing Restrictions: No    Therapy/Group: Individual Therapy  Karoline Caldwell, PT, DPT, CBIS  11/17/2020, 7:51 AM

## 2020-11-17 NOTE — Progress Notes (Signed)
Occupational Therapy Session Note  Patient Details  Name: Brittany Griffith MRN: [id number] Date of Birth: [birth year]  Today's Date: 11/17/2020 OT Individual Time: 4827-0786 OT Individual Time Calculation (min): 60 min    Short Term Goals: Week 3:  OT Short Term Goal 1 (Week 3): Continue working on established LTGs set at supervision to min assist.  Skilled Therapeutic Interventions/Progress Updates:    Pt completed shower and dressing during session with spouse present for education.  She was able to transfer to the EOB with min assist and then ambulate to the shower with min hand held assist.  Slight increased LOB to the right during mobility requiring some facilitation for left weightshift.  She needed mod demonstrational cueing and min assist for removal of her dirty clothing prior to shower.  Mod demonstrational cueing was needed with min assist to complete shower secondary to decreased sequencing as well as decreased attention to the RUE. Therapist provided max hand over hand assist with holding the soap bottle with the RUE and pour it into her right hand for washing her hair.  Instead of attempting to wash her hair,  she instead placed the soap on her body.  She needed tactile cueing on second attempt to bring it up and wash her hair with decreased thoroughness noted.  Max hand over hand was also needed for using the RUE to wash the left arm as well.  She needed mod demonstrational cueing as well for drying off with min assist for stand pivot transfer to the wheelchair with her spouse assisting.  Next, she was able to work on dressing at the sink.  She needed max demonstrational cueing to begin dressing the RLE or the RUE.  When she picked up her shirt, she did attempt to donn it over her UE as opposed to her leg as in the last ADL session.  Min assist to complete donning pullover shirt with mod assist for donning brief and pants.  Therapist assisted with TEDs while pt donned her slip on shoes at min  assist level.  Next, she completed oral hygiene with min assist in standing as well as brushing her hair.  Decreased thoroughness noted with both tasks, requiring some assist from her spouse to complete.  Finished session with use of NMES to the right dorsal forearm for activation of digit extensors using Sabe[employer] Stim One.  She tolerated 10 mins of stimulation on level 7 without any adverse reactions.  Mod demonstrational cueing was needed to maintain visual attention to the right hand while stimulation was active.  The NMES was then placed on the shoulder for 60 mins for stimulation of the UE and treatment of subluxation.  She tolerated at intensity level of 7 again without any adverse reactions.  See below for parameters.  Finished session with call button and phone in reach and her spouse and interpreter present.  Provided information on purchase of NMES for home.    Therapy Documentation Precautions:  Precautions Precautions: Fall,Other (comment) Precaution Comments: global aphasia, dense R hemiparesis, R inattention Restrictions Weight Bearing Restrictions: No  Pain: Pain Assessment Pain Scale: Faces Pain Score: 0-No pain Faces Pain Scale: No hurt ADL: See Care Tool Section for some details of mobility and selfcare  Therapy/Group: Individual Therapy  Nelson Julson OTR/L 11/17/2020, 3:46 PM

## 2020-11-17 NOTE — Progress Notes (Signed)
PROGRESS NOTE   Subjective/Complaints: Per PT following simple commands   ROS: Limited due to being nonverbal/aphasic   Objective:   No results found. No results for input(s): WBC, HGB, HCT, PLT in the last 72 hours. Recent Labs    11/15/20 0653  CREATININE 0.70    Intake/Output Summary (Last 24 hours) at 11/17/2020 0838 Last data filed at 11/16/2020 1858 Gross per 24 hour  Intake 60 ml  Output --  Net 60 ml        Physical Exam: Vital Signs Blood pressure 112/62, pulse 64, temperature 98.1 F (36.7 C), temperature source Oral, resp. rate 16, height 5\' 2"  (1.575 m), weight 59.2 kg, SpO2 97 %.   General: No acute distress Mood and affect are appropriate Heart: Regular rate and rhythm no rubs murmurs or extra sounds Lungs: Clear to auscultation, breathing unlabored, no rales or wheezes Abdomen: Positive bowel sounds, soft nontender to palpation, nondistended Extremities: No clubbing, cyanosis, or edema Skin: No evidence of breakdown, no evidence of rash Motor: RUE/Appear to be 0/5 proximal distal 3- RLE  LUE/LLE: Grossly 5/5 proximal distal Sensation grimaces with R 5th digit nailbed pinch  Global aphasia  Assessment/Plan: 1. Functional deficits which require 3+ hours per day of interdisciplinary therapy in a comprehensive inpatient rehab setting.  Physiatrist is providing close team supervision and 24 hour management of active medical problems listed below.  Physiatrist and rehab team continue to assess barriers to discharge/monitor patient progress toward functional and medical goals  Care Tool:  Bathing    Body parts bathed by patient: Chest,Abdomen,Left upper leg,Face,Right upper leg,Right arm,Right lower leg   Body parts bathed by helper: Left lower leg,Left arm,Front perineal area,Buttocks     Bathing assist Assist Level: Minimal Assistance - Patient > 75%     Upper Body  Dressing/Undressing Upper body dressing   What is the patient wearing?: Pull over shirt    Upper body assist Assist Level: Minimal Assistance - Patient > 75%    Lower Body Dressing/Undressing Lower body dressing      What is the patient wearing?: Pants     Lower body assist Assist for lower body dressing: Moderate Assistance - Patient 50 - 74%     Toileting Toileting    Toileting assist Assist for toileting: Minimal Assistance - Patient > 75%     Transfers Chair/bed transfer  Transfers assist     Chair/bed transfer assist level: Minimal Assistance - Patient > 75%     Locomotion Ambulation   Ambulation assist   Ambulation activity did not occur: N/A  Assist level: Moderate Assistance - Patient 50 - 74% Assistive device: No Device Max distance: 189ft   Walk 10 feet activity   Assist  Walk 10 feet activity did not occur: Safety/medical concerns  Assist level: Moderate Assistance - Patient - 50 - 74% Assistive device: No Device   Walk 50 feet activity   Assist Walk 50 feet with 2 turns activity did not occur: Safety/medical concerns  Assist level: Moderate Assistance - Patient - 50 - 74% Assistive device: No Device    Walk 150 feet activity   Assist Walk 150 feet activity did not occur: Safety/medical  concerns  Assist level: Total Assistance - Patient < 25% Assistive device: Lite Gait    Walk 10 feet on uneven surface  activity   Assist Walk 10 feet on uneven surfaces activity did not occur: Safety/medical concerns         Wheelchair     Assist Will patient use wheelchair at discharge?: Yes (Per PT long term goals) Type of Wheelchair: Manual           Wheelchair 50 feet with 2 turns activity    Assist    Wheelchair 50 feet with 2 turns activity did not occur: Safety/medical concerns       Wheelchair 150 feet activity     Assist  Wheelchair 150 feet activity did not occur: Safety/medical concerns        Blood pressure 112/62, pulse 64, temperature 98.1 F (36.7 C), temperature source Oral, resp. rate 16, height 5\' 2"  (1.575 m), weight 59.2 kg, SpO2 97 %.    Medical Problem List and Plan: 1.  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  Impaired mobility:  -DVT/anticoagulation: Continue Lovenox, does have bruising             -antiplatelet therapy: Aspirin 81 mg daily and Brilinta 90 mg twice daily 3. Pain Management: Tylenol as needed  4/2-patient does not appear to be in any pain and  appears very comfortable-continue Tylenol as needed 4. Mood: Provide emotional support             -antipsychotic agents: N/A 5. Neuropsych: This patient is not capable of making decisions on her own behalf. 6. Skin/Wound Care: Routine skin checks 7. Fluids/Electrolytes/Nutrition: Routine in and outs 8.  Dysphagia.Diet upgrade to   Dysphagia #2 honey thick liquids.  Nasogastric tube d/ced given improved appetite.   Advance diet as tolerated 9.  Hypertension.  Norvasc decreased to 5 on 3/25, Cozaar 50 mg twice daily.  Monitor with increased mobility   Vitals:   11/16/20 1933 11/17/20 0500  BP:  112/62  Pulse: 100 64  Resp: 16 16  Temp: 98 F (36.7 C) 98.1 F (36.7 C)  SpO2: 99% 97%  4/5 controlled   10.  Seizure prophylaxis.  Keppra 500 mg twice daily.  EEG negative  per Neuro cont  Keppra, f/u neuro as OP  11.  Hyperlipidemia.  Lipitor 12.  Incidental findings meningioma.  Follow-up outpatient neurosurgery Dr. Venetia Constable 13.  Hyperglycemia related to tube feeds.  SSI  Slightly elevated on 2/27 14. global aphasia  Mainly aphasic but with good level  of alertness  4/2-discussed things that family might be able to do in the future to help aphasia 15. Urinary retention:  UA negative  Urecholine 5mg  TID, increased to 10 on 3/27, increased to QID 3/28 Added flomax-started to void, spoke with daughter, anticipate good prognosis for bladder, do not anticipate long term need for bladder meds - tolerating wean of urecholine   LOS: 16 days A FACE TO [place] E Ocia Simek 11/17/2020, 8:38 AM

## 2020-11-17 NOTE — Progress Notes (Signed)
Patient ID: Brittany Griffith, female   DOB: 1951/05/19, 70 y.o.   MRN: [id number]  Pt transport chair and bedside commode ordered through [employer]. OP referral faxed.   [place], [place]

## 2020-11-18 DIAGNOSIS — I63512 Cerebral infarction due to unspecified occlusion or stenosis of left middle cerebral artery: Secondary | ICD-10-CM | POA: Diagnosis not present

## 2020-11-18 DIAGNOSIS — R4701 Aphasia: Secondary | ICD-10-CM | POA: Diagnosis not present

## 2020-11-18 LAB — CBC WITH DIFFERENTIAL/PLATELET
Abs Immature Granulocytes: 0.07 10*3/uL (ref 0.00–0.07)
Basophils Absolute: 0 10*3/uL (ref 0.0–0.1)
Basophils Relative: 0 %
Eosinophils Absolute: 0.1 10*3/uL (ref 0.0–0.5)
Eosinophils Relative: 1 %
HCT: 33.6 % — ABNORMAL LOW (ref 36.0–46.0)
Hemoglobin: 11.3 g/dL — ABNORMAL LOW (ref 12.0–15.0)
Immature Granulocytes: 1 %
Lymphocytes Relative: 11 %
Lymphs Abs: 1.4 10*3/uL (ref 0.7–4.0)
MCH: 28.9 pg (ref 26.0–34.0)
MCHC: 33.6 g/dL (ref 30.0–36.0)
MCV: 85.9 fL (ref 80.0–100.0)
Monocytes Absolute: 0.4 10*3/uL (ref 0.1–1.0)
Monocytes Relative: 3 %
Neutro Abs: 11.5 10*3/uL — ABNORMAL HIGH (ref 1.7–7.7)
Neutrophils Relative %: 84 %
Platelets: 297 10*3/uL (ref 150–400)
RBC: 3.91 MIL/uL (ref 3.87–5.11)
RDW: 12.7 % (ref 11.5–15.5)
WBC: 13.5 10*3/uL — ABNORMAL HIGH (ref 4.0–10.5)
nRBC: 0 % (ref 0.0–0.2)

## 2020-11-18 LAB — BASIC METABOLIC PANEL
Anion gap: 9 (ref 5–15)
BUN: 11 mg/dL (ref 8–23)
CO2: 24 mmol/L (ref 22–32)
Calcium: 9.2 mg/dL (ref 8.9–10.3)
Chloride: 103 mmol/L (ref 98–111)
Creatinine, Ser: 0.72 mg/dL (ref 0.44–1.00)
GFR, Estimated: >60 mL/min (ref >60)
Glucose, Bld: 122 mg/dL — ABNORMAL HIGH (ref 70–99)
Potassium: 3.7 mmol/L (ref 3.5–5.1)
Sodium: 136 mmol/L (ref 135–145)

## 2020-11-18 LAB — GLUCOSE, CAPILLARY
Glucose-Capillary: 112 mg/dL — ABNORMAL HIGH (ref 70–99)
Glucose-Capillary: 104 mg/dL — ABNORMAL HIGH (ref 70–99)
Glucose-Capillary: 112 mg/dL — ABNORMAL HIGH (ref 70–99)
Glucose-Capillary: 132 mg/dL — ABNORMAL HIGH (ref 70–99)

## 2020-11-18 NOTE — Progress Notes (Signed)
Speech Language Pathology Daily Session Note  Patient Details  Name: Brittany Griffith MRN: [id number] Date of Birth: August 20, 1950  Today's Date: 11/18/2020 SLP Individual Time: 1100-1130 SLP Individual Time Calculation (min): 30 min  Short Term Goals: Week 3: SLP Short Term Goal 1 (Week 3): STG=LTG due to short length of stay (d/c 4/14)  Skilled Therapeutic Interventions:   Patient asleep in room but son and daughter in law both present for family education. SLP discussed yesterday's swallow test and diet consistency upgrades, process of determining readiness to upgrade solids. Daughter in law asked about how best to communicate with patient without "sounding condescending" and so we discussed various methods to communicate effectively with patient. SLP educated on how currently, focus is on basic level communication (non-verbal) to have her wants/needs met. Plan for final education with family on Monday prior to planned discharge 4/12.  Pain Pain Assessment Pain Scale: Faces Pain Score: 0-No pain Faces Pain Scale: No hurt  Therapy/Group: Individual Therapy  [name], MA, CCC-SLP Speech Therapy

## 2020-11-18 NOTE — Progress Notes (Signed)
PROGRESS NOTE   Subjective/Complaints: Sone at bedside wondering about bruise on RIght elbow, no new all.   ROS: Limited due to being nonverbal/aphasic   Objective:   DG Swallowing Func-Speech Pathology  Result Date: 11/17/2020 Objective Swallowing Evaluation: Type of Study: MBS-Modified Barium Swallow Study  Patient Details Name: Brittany Griffith MRN: [id number] Date of Birth: 10-May-1951 Today's Date: 11/17/2020 Time: SLP Start Time (ACUTE ONLY): 7858 -SLP Stop Time (ACUTE ONLY): 1109 SLP Time Calculation (min) (ACUTE ONLY): 30 min Past Medical History: Past Medical History: Diagnosis Date . Hypercholesteremia  . Hypertension  . Osteopenia  Past Surgical History: Past Surgical History: Procedure Laterality Date . IR ANGIO INTRA EXTRACRAN SEL COM CAROTID INNOMINATE UNI R MOD SED  10/26/2020 . IR INTRA CRAN STENT  10/26/2020 . RADIOLOGY WITH ANESTHESIA Left 10/26/2020  Procedure: RADIOLOGY WITH ANESTHESIA LEFT MCA STENT;  Surgeon: Luanne Bras, MD;  Location: [place];  Service: Radiology;  Laterality: Left; HPI: Patient is a 70 y.o. female with PMH: HLD, HTN, osteopenia who presented to hospital with complaints of speech difficulty and right sided weakness but on EMS arrival symptoms had completely resolved. In ER later in the day, MD reported patient started to have difficulty with getting her words out. 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 The next day (3/14) patient had abnormal neuro checks in AM and repeat MRI showed significant progression of acute infarct in the left MCA territory but negative for hemorrhagic transformation. On 3/16 pt underwent Patient underwent an image-guided cerebral arteriogram with revascularization of right MCA M1. Repeat head CT 3/17 revealed Hyperdense vessel left middle cerebral artery consistent with  occlusion of this vessel and extensive infarct throughout much of the left  temporal lobe,  posterior left frontal lobe, anterior left parietal lobe consistent  with evolving left middle cerebral artery distribution infarct.  6 mm of midline  shift toward the right  Subjective: pleasant, alert husband in room Assessment / Plan / Recommendation CHL IP CLINICAL IMPRESSIONS 11/17/2020 Clinical Impression Patient demonstrates improved oropharyngeal swallow but continues with an oropharyngeal dysphagia. Patient would hold liquids and solids in anterior portion of oral cavity for brief period of time before swallowing; no significant oral residuals observed after swallows. Mechanical soft solids bolus, honey thick liquids, puree solids bolus and puree and barium tablet bolus all transited pharyngeally without difficulty. Patient did exhibit flash penetration with thin liquids but with full clearance of penetrate. Patient exhibited decreased decreased laryngeal elevation and delay in closure of laryngeal vestibule by epiglottis during swallows of thin liquids and nectar thick liquids, leading to flash penetration but no aspiration. Only trace pharyngeal residuals remained after swallows of all tested boluses. Barium tablet taken with puree solids transited pharyngeally without difficulty. Paitent is safe to upgrade to thin liquids at this time. SLP Visit Diagnosis Dysphagia, oropharyngeal phase (R13.12) Attention and concentration deficit following -- Frontal lobe and executive function deficit following -- Impact on safety and function Mild aspiration risk   CHL IP TREATMENT RECOMMENDATION 11/17/2020 Treatment Recommendations Therapy as outlined in treatment plan below   Prognosis 10/28/2020 Prognosis for Safe Diet Advancement Good Barriers to Reach Goals -- Barriers/Prognosis  Comment -- [facility] IP DIET RECOMMENDATION 11/17/2020 SLP Diet Recommendations Dysphagia 2 (Fine chop) solids;Thin liquid Liquid Administration via Straw;Cup Medication Administration Whole meds with puree Compensations Minimize  environmental distractions;Slow rate;Small sips/bites Postural Changes Seated upright at 90 degrees   CHL IP OTHER RECOMMENDATIONS 11/17/2020 Recommended Consults -- Oral Care Recommendations Oral care BID Other Recommendations --   CH[facility] IP FOLLOW UP RECOMMENDATIONS 10/31/2020 Follow up Recommendations Inpatient Rehab   CHL IP FREQUENCY AND DURATION 10/28/2020 Speech Therapy Frequency (ACUTE ONLY) min 2x/week Treatment Duration 2 weeks      CHL IP ORAL PHASE 11/17/2020 Oral Phase Impaired Oral - Pudding Teaspoon -- Oral - Pudding Cup -- Oral - Honey Teaspoon NT Oral - Honey Cup Delayed oral transit;Holding of bolus Oral - Nectar Teaspoon NT Oral - Nectar Cup Holding of bolus;Delayed oral transit Oral - Nectar Straw -- Oral - Thin Teaspoon NT Oral - Thin Cup Delayed oral transit;Holding of bolus Oral - Thin Straw Delayed oral transit Oral - Puree Delayed oral transit Oral - Mech Soft Delayed oral transit;Impaired mastication Oral - Regular -- Oral - Multi-Consistency -- Oral - Pill Delayed oral transit Oral Phase - Comment --  CHL IP PHARYNGEAL PHASE 11/17/2020 Pharyngeal Phase Impaired Pharyngeal- Pudding Teaspoon -- Pharyngeal -- Pharyngeal- Pudding Cup -- Pharyngeal -- Pharyngeal- Honey Teaspoon NT Pharyngeal -- Pharyngeal- Honey Cup [facility] Pharyngeal Material does not enter airway Pharyngeal- Nectar Teaspoon NT Pharyngeal Material enters airway, remains ABOVE vocal cords then ejected out Pharyngeal- Nectar Cup Reduced laryngeal elevation;Reduced airway/laryngeal closure;Penetration/Aspiration during swallow Pharyngeal Material enters airway, remains ABOVE vocal cords then ejected out Pharyngeal- Nectar Straw NT Pharyngeal -- Pharyngeal- Thin Teaspoon NT Pharyngeal -- Pharyngeal- Thin Cup Reduced airway/laryngeal closure;Reduced laryngeal elevation;Penetration/Aspiration during swallow;Pharyngeal residue - valleculae;Pharyngeal residue - pyriform Pharyngeal Material enters airway, remains ABOVE vocal cords then ejected out  Pharyngeal- Thin Straw Reduced laryngeal elevation;Penetration/Aspiration during swallow Pharyngeal Material enters airway, remains ABOVE vocal cords then ejected out Pharyngeal- Puree Pharyngeal residue - valleculae;Pharyngeal residue - pyriform Pharyngeal -- Pharyngeal- Mechanical Soft WFL Pharyngeal -- Pharyngeal- Regular -- Pharyngeal -- Pharyngeal- Multi-consistency -- Pharyngeal -- Pharyngeal- Pill WFL Pharyngeal -- Pharyngeal Comment --  CHL IP CERVICAL ESOPHAGEAL PHASE 11/17/2020 Cervical Esophageal Phase WFL Pudding Teaspoon -- Pudding Cup -- Honey Teaspoon -- Honey Cup -- Nectar Teaspoon -- Nectar Cup -- Nectar Straw -- Thin Teaspoon -- Thin Cup -- Thin Straw -- Puree -- Mechanical Soft -- Regular -- Multi-consistency -- Pill -- Cervical Esophageal Comment -- [name], MA, CCC-SLP Speech Therapy             No results for input(s): WBC, HGB, HCT, PLT in the last 72 hours. No results for input(s): NA, K, CL, CO2, GLUCOSE, BUN, CREATININE, CALCIUM in the last 72 hours.  Intake/Output Summary (Last 24 hours) at 11/18/2020 0828 Last data filed at 11/17/2020 1348 Gross per 24 hour  Intake 0 ml  Output --  Net 0 ml        Physical Exam: Vital Signs Blood pressure (!) 129/59, pulse 75, temperature 98.7 F (37.1 C), resp. rate 17, height 5\' 2"  (1.575 m), weight 58.7 kg, SpO2 100 %.   General: No acute distress Mood and affect are appropriate Heart: Regular rate and rhythm no rubs murmurs or extra sounds Lungs: Clear to auscultation, breathing unlabored, no rales or wheezes Abdomen: Positive bowel sounds, soft nontender to palpation, nondistended Extremities: No clubbing, cyanosis, or edema Skin:ecchymosis RIght elbow  Motor: RUE/Appear to be 0/5 proximal distal 3- RLE  LUE/LLE: Grossly 5/5 proximal distal Sensation grimaces with R 5th digit nailbed pinch  Global aphasia  Assessment/Plan: 1. Functional deficits which require 3+ hours per day of interdisciplinary therapy in a  comprehensive inpatient rehab setting.  Physiatrist is providing close team supervision and 24 hour management of active medical problems listed below.  Physiatrist and rehab team continue to assess barriers to discharge/monitor patient progress toward functional and medical goals  Care Tool:  Bathing    Body parts bathed by patient: Chest,Abdomen,Left upper leg,Face,Right upper leg,Right arm,Right lower leg   Body parts bathed by helper: Left lower leg,Left arm,Front perineal area,Buttocks     Bathing assist Assist Level: Minimal Assistance - Patient > 75%     Upper Body Dressing/Undressing Upper body dressing   What is the patient wearing?: Pull over shirt    Upper body assist Assist Level: Minimal Assistance - Patient > 75%    Lower Body Dressing/Undressing Lower body dressing      What is the patient wearing?: Pants     Lower body assist Assist for lower body dressing: Moderate Assistance - Patient 50 - 74%     Toileting Toileting    Toileting assist Assist for toileting: Minimal Assistance - Patient > 75%     Transfers Chair/bed transfer  Transfers assist     Chair/bed transfer assist level: Minimal Assistance - Patient > 75%     Locomotion Ambulation   Ambulation assist   Ambulation activity did not occur: N/A  Assist level: Minimal Assistance - Patient > 75% Assistive device: No Device Max distance: 150   Walk 10 feet activity   Assist  Walk 10 feet activity did not occur: Safety/medical concerns  Assist level: Minimal Assistance - Patient > 75% Assistive device: No Device   Walk 50 feet activity   Assist Walk 50 feet with 2 turns activity did not occur: Safety/medical concerns  Assist level: Minimal Assistance - Patient > 75% Assistive device: No Device    Walk 150 feet activity   Assist Walk 150 feet activity did not occur: Safety/medical concerns  Assist level: Minimal Assistance - Patient > 75% Assistive device: No  Device    Walk 10 feet on uneven surface  activity   Assist Walk 10 feet on uneven surfaces activity did not occur: Safety/medical concerns         Wheelchair     Assist Will patient use wheelchair at discharge?: Yes (Per PT long term goals) Type of Wheelchair: Manual           Wheelchair 50 feet with 2 turns activity    Assist    Wheelchair 50 feet with 2 turns activity did not occur: Safety/medical concerns       Wheelchair 150 feet activity     Assist  Wheelchair 150 feet activity did not occur: Safety/medical concerns       Blood pressure (!) 129/59, pulse 75, temperature 98.7 F (37.1 C), resp. rate 17, height 5\' 2"  (1.575 m), weight 58.7 kg, SpO2 100 %.    Medical Problem List and Plan: 1.  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  Impaired mobility:  -DVT/anticoagulation: Continue Lovenox, does have bruising             -antiplatelet therapy:  Aspirin 81 mg daily and Brilinta 90 mg twice daily 3. Pain Management: Tylenol as needed  4/2-patient does not appear to be in any pain and  appears very comfortable-continue Tylenol as needed 4. Mood: Provide emotional support             -antipsychotic agents: N/A 5. Neuropsych: This patient is not capable of making decisions on her own behalf. 6. Skin/Wound Care: Routine skin checks, ecchymosis, pt with flaccid RUE will order elbow pad 7. Fluids/Electrolytes/Nutrition: Routine in and outs 8.  Dysphagia.Diet upgrade to   Dysphagia #2 honey thick liquids.  Nasogastric tube d/ced given improved appetite.   Advance diet as tolerated 9.  Hypertension.  Norvasc decreased to 5 on 3/25, Cozaar 50 mg twice daily.  Monitor with increased mobility   Vitals:   11/17/20 1926  11/18/20 0448  BP: 138/64 (!) 129/59  Pulse: 63 75  Resp: 18 17  Temp: 98.3 F (36.8 C) 98.7 F (37.1 C)  SpO2: 97% 100%  4/5 controlled   10.  Seizure prophylaxis.  Keppra 500 mg twice daily.  EEG negative  per Neuro cont  Keppra, f/u neuro as OP  11.  Hyperlipidemia.  Lipitor 12.  Incidental findings meningioma.  Follow-up outpatient neurosurgery Dr. Venetia Constable 13.  Hyperglycemia related to tube feeds.  SSI  Slightly elevated on 2/27 14. global aphasia  Mainly aphasic but with good level of alertness  4/2-discussed things that family might be able to do in the future to help aphasia 15. Urinary retention:  UA negative  Urecholine 5mg  TID, increased to 10 on 3/27, increased to QID 3/28 Added flomax-started to void, spoke with daughter, anticipate good prognosis for bladder, do not anticipate long term need for bladder meds - tolerating wean of urecholine  16.  Somnolent this am, reportedly slept well check UA, check CBC and BMET , see how she does with therapy today  LOS: 17 days A FACE TO FACE EVALUATION WAS PERFORMED  Charlett Blake 11/18/2020, 8:28 AM

## 2020-11-18 NOTE — Progress Notes (Signed)
Physical Therapy Session Note  Patient Details  Name: Brittany Griffith MRN: [id number] Date of Birth: [birth year]  Today's Date: 11/18/2020 PT Individual Time: 8185-6314 PT Individual Time Calculation (min): 52 min   Short Term Goals: Week 3:  PT Short Term Goal 1 (Week 3): = to LTGs based on ELOS  Skilled Therapeutic Interventions/Progress Updates:    Patient received sitting up in wc, family and RN present, Interpretor present. RN completing brief neuro exam based on sons concerns re: level of lethargy and "not appearing to focus on anything." Patient denied pain when asked, but did appear to be lethargic and not her "typical" self. Husband reports that she didn't sleep well again last night. No gross findings on neuro exam noted. RN cleared patient to move to her tolerance and with PT discretion. Son requesting that patient simply transfer back to bed and not participate in formal family ed/ significant OOB activity. Patient transferring back to bed via squat pivot with MinA, which is slightly more assist than typical, but not significantly. PT briefly reviewing how to safely assist patient with transfer to bed. She was able to return supine with MinA. PT answering familys questions regarding home modifications, use of equipment, safe mobility at home. PT reviewing, without demonstrating due to patients level of lethargy, how to assist patient with household ambulation, including use of gait belt and support of R UE. PT noted that a RW is not recommended at this time due to increased difficulty with coordinating gait with AD, though once patient participates in OP PT, this may change. PT discussing that initially household ambulation should be on an as-needed basis to allow patient to re-acclimate to being home and then introducing ambulation around the house for exercise. Family voiced understanding. PT also advising against outdoor ambulation at this time. PT emphasizing locking brakes of transport  chair before transferring in/out. Daught in law with questions regarding safety with patient in bed. PT noting that while patient is not impulsive, some families have found it comforting to have a baby monitor set up or purchase a chair alarm to put under bedsheets if patient is going to be in bed during the day unattended. Daughter in law also with multiple questions regarding items for incontinence/accidents. PT advising family to adopt toileting schedule to help minimize number of accident and therefore risk of skin breakdown. While PT discussing info above with family, patient resting in bed, but did appear to be in pain with furrowed brow and holding her stomach. When asked, she denied stomach pain. At end of session, patient remaining in bed. Family requested to not have the alarm on to allow them to lay in the bed with the patient. 4 rails up, telesitting in place, RN aware that alarm is not set and also of patients tolerance to therapy session and apparent stomach pain.   Therapy Documentation Precautions:  Precautions Precautions: Fall,Other (comment) Precaution Comments: global aphasia, dense R hemiparesis, R inattention Restrictions Weight Bearing Restrictions: No    Therapy/Group: Individual Therapy  Karoline Caldwell, PT, DPT, CBIS  11/18/2020, 7:36 AM

## 2020-11-18 NOTE — Progress Notes (Signed)
Occupational Therapy Note  Patient Details  Name: Brittany Griffith MRN: [id number] Date of Birth: 1950/10/12  Pt missed 30 mins of OT secondary to fatigue.  Family in room as well for support.     Bonham Zingale OTR/L 11/18/2020, 12:01 PM

## 2020-11-18 NOTE — Progress Notes (Signed)
Occupational Therapy Session Note  Patient Details  Name: Brittany Griffith MRN: [id number] Date of Birth: 07/05/1951  Today's Date: 11/18/2020 OT Individual Time: 6948-5462 OT Individual Time Calculation (min): 60 min    Short Term Goals: Week 3:  OT Short Term Goal 1 (Week 3): Continue working on established LTGs set at supervision to min assist.  Skilled Therapeutic Interventions/Progress Updates:    Pt in bed to start with her spouse, son, and son's wife present for family education.  She was much more fatigued and sleepy this session compared to other sessions.  Mod assist for transfer to the EOB in preparation for LB dressing tasks.  She was able to donn her pants with mod assist following hemi technique.  Min guard assist for balance with mod assist for crossing and maintaining the RLE over the left knee.  Next, therapist assisted with donning TEDs and then she donned her slip on shoes with mod assist.  Slight motor planning difficulty noted as she placed her left foot on top of the shoe to put it on instead of sliding it into the show under the elastic strap.  She was able to correct this without assistance.  Next, she completed bed to chair transfer with min assist stand pivot.  Educated family on technique for transferring to the wheelchair as well as practicing transfer to the toilet.  Son and daughter-in-law completed practice with stand pivot transfers to the toilet, but pt was not as efficient as usual secondary to fatigue.  Min to mod assist needed for family to assist.  Will need continued hands on practice which is scheduled for Monday.  Discussed dressing techniques, NMES, as well as slower recovery in the RUE compared to the LE.  Finished with pt in the wheelchair working on breakfast with family.    Therapy Documentation Precautions:  Precautions Precautions: Fall,Other (comment) Precaution Comments: global aphasia, dense R hemiparesis, R inattention Restrictions Weight Bearing  Restrictions: No  Pain: Pain Assessment Pain Scale: Faces Pain Score: 0-No pain ADL: See Care Tool Section for some details of mobility and selfcare  Therapy/Group: Individual Therapy  Mishika Flippen OTR/L 11/18/2020, 11:51 AM

## 2020-11-19 LAB — GLUCOSE, CAPILLARY
Glucose-Capillary: 118 mg/dL — ABNORMAL HIGH (ref 70–99)
Glucose-Capillary: 100 mg/dL — ABNORMAL HIGH (ref 70–99)
Glucose-Capillary: 108 mg/dL — ABNORMAL HIGH (ref 70–99)
Glucose-Capillary: 145 mg/dL — ABNORMAL HIGH (ref 70–99)

## 2020-11-19 NOTE — Progress Notes (Signed)
Physical Therapy Session Note  Patient Details  Name: Brittany Griffith MRN: [id number] Date of Birth: Dec 30, 1950  Today's Date: 11/19/2020 PT Individual Time: 1012-1116 PT Individual Time Calculation (min): 64 min   Short Term Goals: Week 3:  PT Short Term Goal 1 (Week 3): = to LTGs based on ELOS  Skilled Therapeutic Interventions/Progress Updates:    Pt received supine in bed and nodes head "yes" in agreement to therapy session. Pt's husband present and agreeable to hands-on education/training today due to pt's limited ability to participate yesterday from lethargy. Pt's husband reports she slept better last night after receiving tylenol for L hip pain. In-person interpreter, Delrae Rend, present throughout session. Pt performed supine>sitting L EOB, HOB flat and not using bedrail, with pt's husband providing proper min assist for trunk upright - discussed guarding her from sliding towards edge due to their home bed being higher (simulated during session). Sitting EOB donned pants and shoes with mod assist from pt's husband, demonstrates proper sequencing without cuing, but did cue to have pt perform as much as possible. Educated on use of gait belt for all standing/ambulating/transfer tasks. Sit<>stand to/from EOB, no AD, with pt's husband providing CGA/min assist and mod assist to pull pants up over R hip. R stand pivot EOB>w/c with pt's husband providing CGA/min assist. Therapist educated pt's husband on recommendation for transport chair versus wheelchair and showed him a picture of transport chair - upon discussion with pt's husband therapist recommends wheelchair at discharge to allow increased pt independence and decreased caregiver burden, pt's husband in agreement with this plan.  Transported to/from gym in w/c for time management and energy conservation. Gait training ~3ft, no AD, with R UE support while therapist demonstrated to pt's husband proper assistance technique providing CGA/min assist  pending RLE foot clearance during swing as occasionally pt's toes catch causing minor anterior LOB - also, pt demonstrates repeated minor R posterior LOB when turing due to poor RLE step placement causing narrow BOS. Pt ambulated ~33ft 2x with pt's husband providing min assist for balance - husband appears to be utilizing a significant amount of energy to assist patient in this manner. Discussed ambulation with RW, performed ~81ft with husband providing CGA/min assist more for AD management - reports and appears to be more comfortable and safe assisting pt with RW compared to no AD - recommended pt use RW at D/C with R hand orthosis, husband in agreement. Discussed home entry with husband reporting 1 small step up then a threshold into home. Simulated home entry via stepping up/down on/off 1, 4" step 2x with therapist and 4x with pt's husband - demonstrates safe technique not using AD for this to allow pt's husband to be closer to pt for safety. Transported to ADL apartment. Gait training ~79ft in ADL apartment using RW with pt's husband providing proper CGA/min assist - pt demos decreased R LE foot clearance on carpet and therapist educated on safety with this in their home. Discussed car transfer technique but due to time constraints unable to perform today. Transported back to room and pt agreeable to remain sitting in w/c - left with needs in reach and husband present.  Therapy Documentation Precautions:  Precautions Precautions: Fall,Other (comment) Precaution Comments: global aphasia, dense R hemiparesis, R inattention Restrictions Weight Bearing Restrictions: No  Pain: No indications of pain during session.   Therapy/Group: Individual Therapy  Tawana Scale , PT, DPT, CSRS  11/19/2020, 7:54 AM

## 2020-11-20 DIAGNOSIS — I63512 Cerebral infarction due to unspecified occlusion or stenosis of left middle cerebral artery: Secondary | ICD-10-CM | POA: Diagnosis not present

## 2020-11-20 DIAGNOSIS — R4701 Aphasia: Secondary | ICD-10-CM | POA: Diagnosis not present

## 2020-11-20 LAB — URINALYSIS, ROUTINE W REFLEX MICROSCOPIC
Bilirubin Urine: NEGATIVE
Glucose, UA: NEGATIVE mg/dL
Hgb urine dipstick: NEGATIVE
Ketones, ur: NEGATIVE mg/dL
Nitrite: NEGATIVE
Protein, ur: NEGATIVE mg/dL
Specific Gravity, Urine: 1.004 — ABNORMAL LOW (ref 1.005–1.030)
pH: 6 (ref 5.0–8.0)

## 2020-11-20 LAB — GLUCOSE, CAPILLARY
Glucose-Capillary: 101 mg/dL — ABNORMAL HIGH (ref 70–99)
Glucose-Capillary: 97 mg/dL (ref 70–99)
Glucose-Capillary: 104 mg/dL — ABNORMAL HIGH (ref 70–99)
Glucose-Capillary: 183 mg/dL — ABNORMAL HIGH (ref 70–99)

## 2020-11-20 MED ORDER — LOSARTAN POTASSIUM 50 MG PO TABS
25.0000 mg | ORAL_TABLET | Freq: Two times a day (BID) | ORAL | Status: DC
  Administered 2020-11-20 – 2020-11-22 (×4): 25 mg
  Filled 2020-11-20 (×4): qty 1

## 2020-11-20 NOTE — Discharge Summary (Signed)
Physician Discharge Summary  Patient ID: Brittany Griffith MRN: [id number] DOB/AGE: 68-Jan-1952 68 y.o.  Admit date: 11/01/2020 Discharge date: 11/22/2020  Discharge Diagnoses:  Principal Problem:   Left middle cerebral artery stroke [facility]) Active Problems:   left MCA stroke   Meningioma (HCC)   Cerebral edema (HCC)   Global aphasia   Hypertensive urgency   Dysphagia   Right hemiplegia (HCC)   Urinary retention   Hyperglycemia   Essential hypertension   Dysphagia, post-stroke DVT prophylaxis Seizure prophylaxis Hyperlipidemia Hypokalemia  Discharged Condition: Stable  Significant Diagnostic Studies: CT Angio Head W/Cm &/Or Wo Cm  Result Date: 10/23/2020 CLINICAL DATA:  Altered mental status. EXAM: CT ANGIOGRAPHY HEAD AND NECK TECHNIQUE: Multidetector CT imaging of the head and neck was performed using the standard protocol during bolus administration of intravenous contrast. Multiplanar CT image reconstructions and MIPs were obtained to evaluate the vascular anatomy. Carotid stenosis measurements (when applicable) are obtained utilizing NASCET criteria, using the distal internal carotid diameter as the denominator. CONTRAST:  57mL OMNIPAQUE IOHEXOL 350 MG/ML SOLN COMPARISON:  None. FINDINGS: CT HEAD FINDINGS Brain: There is a 2.2 x 2.2 x 2.4 cm partially calcified extra-axial mass anteriorly along the falx in the left frontal region with evidence of very mild edema in the left frontal lobe. This mass is associated with extensive dural ossification/hyperostosis anteriorly and laterally over the left frontal convexity with mass effect on the underlying left frontal lobe. Separate foci of dural calcification/ossification are noted more superiorly along the falx. There is also an 8 mm focus of fat along the inner aspect of the anterior left frontal hyperostosis. There is trace rightward midline shift at the level of the mass. There is mild mass effect on the left lateral ventricle. There is no  evidence of hydrocephalus, an acute infarct, intracranial hemorrhage, or extra-axial fluid collection. A small right cerebellar infarct is favored to be chronic. Vascular: Calcified atherosclerosis at the skull base. Skull: Extensive left frontal hyperostosis. Sinuses: Partially visualized mucosal thickening and likely fluid in the maxillary sinuses. Clear mastoid air cells. Orbits: Unremarkable. 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 Wide patency of both subclavian arteries with mild nonstenotic plaque in the proximal left subclavian artery. Right carotid system: Patent with mild atheromatous wall thickening in the mid and distal common carotid artery. No evidence of a dissection or significant stenosis. Left carotid system: Patent with mild atheromatous wall thickening in the mid and distal common carotid artery and mild calcified plaque at the carotid bifurcation. No evidence of dissection or significant stenosis. Vertebral arteries: Patent without evidence of stenosis or dissection. Strongly dominant left vertebral artery. Skeleton: Mild disc and moderate facet degeneration in the cervical spine. Other neck: Bilateral thyroid nodules including a 2.3 cm nodule on the right. No evidence of cervical lymphadenopathy Upper chest: Left greater than right apical lung scarring. Review of the MIP images confirms the above findings CTA HEAD FINDINGS Anterior circulation: The internal carotid arteries are patent from skull base to carotid termini with mild atherosclerotic plaque bilaterally not resulting in a significant stenosis. ACAs and MCAs are patent without evidence of a proximal branch occlusion. There are moderate proximal right M1 and severe distal left M1 stenoses. Mild branch vessel irregularity is present bilaterally. No aneurysm is  identified. Posterior circulation: The intracranial vertebral arteries are widely patent to the basilar with the right  being diminutive distal to the PICA origin. Patent PICA, AICA, and SCA origins are identified bilaterally. The basilar artery is widely patent. Both PCAs are patent with moderate left greater than right P2 segment stenoses. No aneurysm is identified. Venous sinuses: Narrowed but patent superior sagittal sinus by dural ossification. Anatomic variants: Hypoplastic right vertebral artery. Review of the MIP images confirms the above findings IMPRESSION: 1. 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 Mild edema in the left frontal lobe. Trace local rightward midline shift. Brain MRI without and with contrast is recommended for further evaluation. 2. No evidence of an acute infarct or intracranial hemorrhage. 3. No large vessel occlusion. 4. Intracranial atherosclerosis including moderate right and severe left M1 stenoses and moderate bilateral P2 stenoses. 5. Widely patent cervical carotid and vertebral arteries. 6. 2.3 cm right thyroid nodule. Recommend thyroid US (ref: J Am Coll Radiol. 2015 Feb;12(2): 143-50). Electronically Signed   By: Logan Bores M.D.   On: 10/23/2020 14:06   DG Pelvis 1-2 Views  Result Date: 11/08/2020 CLINICAL DATA:  Chronic left hip pain. EXAM: PELVIS - 1-2 VIEW COMPARISON:  None. FINDINGS: There is no evidence of pelvic fracture or diastasis. No pelvic bone lesions are seen. IMPRESSION: Negative. Electronically Signed   By: Marijo Conception M.D.   On: 11/08/2020 12:32   CT HEAD WO CONTRAST  Result Date: 11/11/2020 CLINICAL DATA:  LEFT MCA infarction EXAM: CT HEAD WITHOUT CONTRAST TECHNIQUE: Contiguous axial images were obtained from the base of the skull through the vertex without intravenous contrast. COMPARISON:  MRI 10/30/2020, CT 10/27/2020 FINDINGS: Brain: Large middle cerebral territory infarction  again noted. No significant interval change. No intraparenchymal or extra-axial hemorrhage. No intraventricular hemorrhage. Basilar cisterns are patent. No hydrocephalus. 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 Vascular: LEFT MCA stent noted. 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 Sinuses/Orbits: Fluid in the maxillary sinuses unchanged. Orbits normal Other: None IMPRESSION: 1. No significant change. 2. 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 3. No hemorrhagic conversion identified by CT. No hydrocephalus. Basal cisterns are patent. Electronically Signed   By: Suzy Bouchard M.D.   On: 11/11/2020 18:14   CT HEAD WO CONTRAST  Addendum Date: 10/27/2020   ADDENDUM REPORT: 10/27/2020 15:45 ADDENDUM: Comment: The increased attenuation in the left middle cerebral artery is actually due to a stent in this vessel. Electronically Signed   By: Lowella Grip III M.D.   On: 10/27/2020 15:45   Result Date: 10/27/2020 CLINICAL DATA:  Right upper extremity weakness EXAM: CT HEAD WITHOUT CONTRAST TECHNIQUE: Contiguous axial images were obtained from the base of the skull through the vertex without intravenous contrast. COMPARISON:  October 26, 2020 FINDINGS: Brain: Compared to 1 day prior, there is now cytotoxic edema and decreased attenuation throughout much of the anterior and mid left temporal lobe as well as portions of the anterior left parietal lobe and a portion of the posterior aspect of the left frontal lobe. This edema causes relative effacement of the left lateral ventricle. There is now approximately 6 mm of midline shift to the right. Infarct is again noted involving the left external capsule, claustrum, extreme capsule as well as insular cortex anteriorly and midportion regions. Previous partially calcified meningioma in the medial left frontal region with extensive dural  thickening/calcification in the left frontal region is stable consistent with meningioma. This appearance is stable. Calcification  along the dura of the left anterior parietal lobe and anterior left temporal lobe is also stable. No new evident mass. No acute hemorrhage is evident. Vascular: There is a left middle cerebral artery hyperdense vessel consistent with occlusion of the left middle cerebral artery. There is calcification in each carotid siphon region. Skull: Bony calvarium appears intact. Meningioma changes on the left as noted above. Sinuses/Orbits: Mucosal thickening noted in the maxillary antra bilaterally with small air-fluid levels. Mucosal thickening noted in several ethmoid air cells. Orbits appear symmetric bilaterally. Other: Mastoid air cells clear. IMPRESSION: 1. Hyperdense vessel left middle cerebral artery consistent with occlusion of this vessel. 2. Extensive infarct throughout much of the left temporal lobe, posterior left frontal lobe, anterior left parietal lobe consistent with evolving left middle cerebral artery distribution infarct. This infarct includes portions of the left external capsule, claustrum, extreme capsule, and insular cortex. There is cytotoxic edema causing effacement of the left frontal horn and 6 mm of midline shift toward the right. Fourth ventricle midline. 3. Left frontal meningioma with extensive dural base calcification on the left persists without change. 4.  Foci of arterial vascular calcification noted. 5.  Areas of paranasal sinus disease noted. These results will be called to the ordering clinician or representative by the Radiologist Assistant, and communication documented in the PACS or [employer]. Electronically Signed: By: Lowella Grip III M.D. On: 10/27/2020 11:18   CT HEAD WO CONTRAST  Result Date: 10/26/2020 CLINICAL DATA:  Mental status change.  Lethargy.  Stroke. EXAM: CT HEAD WITHOUT CONTRAST TECHNIQUE: Contiguous axial images were  obtained from the base of the skull through the vertex without intravenous contrast. COMPARISON:  MRI head 10/24/2020.  CT head 10/25/2020 FINDINGS: Brain: Evolving infarct left MCA territory. Hypodensity in the deep white matter on the left. Hypodensity in the left insula. These areas show restricted diffusion on recent MRI and are consistent with stable evolving infarct. No new infarct or hemorrhage. Chronic infarct right cerebellum unchanged. Ventricle size normal.  No midline shift. Extensive extra-axial dural base calcification in the left frontal parietal lobe compatible with meningioma, stable from the prior study. Mild associated edema in the left frontal white matter. Vascular: Negative for hyperdense vessel Skull: Thickening of the left frontal bone compatible with hyperostosis related to meningioma, stable Sinuses/Orbits: Mucosal edema and air-fluid levels in the maxillary sinus bilaterally. Negative orbit Other: None IMPRESSION: Evolving subacute infarcts in the left MCA territory, consistent with recent MRI. No new infarct or hemorrhage. Extensive dural ossification compatible with meningioma on the left. Electronically Signed   By: Franchot Gallo M.D.   On: 10/26/2020 10:10   CT HEAD WO CONTRAST  Result Date: 10/24/2020 CLINICAL DATA:  70 year old female with worsening NIH stroke scale. Punctate left MCA territory infarcts on MRI yesterday. EXAM: CT HEAD WITHOUT CONTRAST TECHNIQUE: Contiguous axial images were obtained from the base of the skull through the vertex without intravenous contrast. COMPARISON:  Brain MRI, CTA head and neck yesterday. FINDINGS: Brain: Suspicious appearance now of the left occipital pole suggesting new cytotoxic edema there (series 4, image 12). Streak artifact from the complex skull lesion is felt less likely. Cytotoxic edema is identified in the posterior insula and appears larger than the DWI lesion there yesterday. But other gray-white matter differentiation in the  left MCA territory appears stable. No acute intracranial hemorrhage identified. No ventriculomegaly. Chronic small cerebellar infarcts appear stable. Complex left frontal convexity meningioma(s) and associated mass effect appear stable. Possible mild anterior left frontal lobe  vasogenic edema is stable. Vascular: Calcified atherosclerosis at the skull base. No suspicious intracranial vascular hyperdensity. 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 No acute osseous abnormality identified. Sinuses/Orbits: Stable. Small maxillary sinus fluid levels and mucoperiosteal thickening. Other: Stable orbit and scalp soft tissues. IMPRESSION: 1. Appearance suspicious for new Left PCA territory ischemia in the occipital pole. However, this might be streak artifact related to the complex left skull lesion. 2. Cytotoxic edema now evident at the posterior left insula and mildly larger than the DWI lesion yesterday. 3. Stable brain otherwise. No acute hemorrhage or increased intracranial mass effect. 4. Stable complex left frontal convexity meningioma(s) and skull hyperostosis. Electronically Signed   By: Genevie Ann M.D.   On: 10/24/2020 06:13   CT Angio Neck W and/or Wo Contrast  Result Date: 10/23/2020 CLINICAL DATA:  Altered mental status. EXAM: CT ANGIOGRAPHY HEAD AND NECK TECHNIQUE: Multidetector CT imaging of the head and neck was performed using the standard protocol during bolus administration of intravenous contrast. Multiplanar CT image reconstructions and MIPs were obtained to evaluate the vascular anatomy. Carotid stenosis measurements (when applicable) are obtained utilizing NASCET criteria, using the distal internal carotid diameter as the denominator. CONTRAST:  73mL OMNIPAQUE IOHEXOL 350 MG/ML SOLN COMPARISON:  None. FINDINGS: CT HEAD FINDINGS Brain: There is a 2.2 x 2.2 x 2.4 cm partially calcified extra-axial mass anteriorly along the falx in the left frontal region  with evidence of very mild edema in the left frontal lobe. This mass is associated with extensive dural ossification/hyperostosis anteriorly and laterally over the left frontal convexity with mass effect on the underlying left frontal lobe. Separate foci of dural calcification/ossification are noted more superiorly along the falx. There is also an 8 mm focus of fat along the inner aspect of the anterior left frontal hyperostosis. There is trace rightward midline shift at the level of the mass. There is mild mass effect on the left lateral ventricle. There is no evidence of hydrocephalus, an acute infarct, intracranial hemorrhage, or extra-axial fluid collection. A small right cerebellar infarct is favored to be chronic. Vascular: Calcified atherosclerosis at the skull base. Skull: Extensive left frontal hyperostosis. Sinuses: Partially visualized mucosal thickening and likely fluid in the maxillary sinuses. Clear mastoid air cells. Orbits: Unremarkable. 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 Wide patency of both subclavian arteries with mild nonstenotic plaque in the proximal left subclavian artery. Right carotid system: Patent with mild atheromatous wall thickening in the mid and distal common carotid artery. No evidence of a dissection or significant stenosis. Left carotid system: Patent with mild atheromatous wall thickening in the mid and distal common carotid artery and mild calcified plaque at the carotid bifurcation. No evidence of dissection or significant stenosis. Vertebral arteries: Patent without evidence of stenosis or dissection. Strongly dominant left vertebral artery. Skeleton: Mild disc and moderate facet degeneration in the cervical spine. Other neck: Bilateral thyroid nodules including a 2.3 cm nodule on the right. No evidence  of cervical lymphadenopathy Upper chest: Left greater than right apical lung scarring. Review of the MIP images confirms the above findings CTA HEAD FINDINGS Anterior circulation: The internal carotid arteries are patent from skull base to carotid termini with mild atherosclerotic plaque bilaterally not resulting in a significant stenosis. ACAs and MCAs are patent without evidence of a proximal branch occlusion. There are  moderate proximal right M1 and severe distal left M1 stenoses. Mild branch vessel irregularity is present bilaterally. No aneurysm is identified. Posterior circulation: The intracranial vertebral arteries are widely patent to the basilar with the right being diminutive distal to the PICA origin. Patent PICA, AICA, and SCA origins are identified bilaterally. The basilar artery is widely patent. Both PCAs are patent with moderate left greater than right P2 segment stenoses. No aneurysm is identified. Venous sinuses: Narrowed but patent superior sagittal sinus by dural ossification. Anatomic variants: Hypoplastic right vertebral artery. Review of the MIP images confirms the above findings IMPRESSION: 1. 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 Mild edema in the left frontal lobe. Trace local rightward midline shift. Brain MRI without and with contrast is recommended for further evaluation. 2. No evidence of an acute infarct or intracranial hemorrhage. 3. No large vessel occlusion. 4. Intracranial atherosclerosis including moderate right and severe left M1 stenoses and moderate bilateral P2 stenoses. 5. Widely patent cervical carotid and vertebral arteries. 6. 2.3 cm right thyroid nodule. Recommend thyroid US (ref: J Am Coll Radiol. 2015 Feb;12(2): 143-50). Electronically Signed   By: Logan Bores M.D.   On: 10/23/2020 14:06   MR ANGIO HEAD WO CONTRAST  Result Date: 10/30/2020 CLINICAL DATA:  Follow-up left MCA angioplasty.  Worsening weakness in the right upper extremity. EXAM: MRI HEAD WITHOUT CONTRAST MRA HEAD WITHOUT CONTRAST TECHNIQUE: Multiplanar, multiecho pulse sequences of the brain and surrounding structures were obtained without intravenous contrast. Angiographic images of the head were obtained using MRA technique without contrast. COMPARISON:  MRI 10/24/2020. Subsequent CT and interventional studies. FINDINGS: MRI HEAD FINDINGS Brain: Considerable extension of the left middle cerebral artery territory infarction, now involving the vast majority of the cortical and subcortical distribution of the left middle cerebral artery, with sparing of the basal ganglia. Mild swelling. Petechial blood products without frank hematoma. Vascular: Not evaluated using requested technique. Skull and upper cervical spine: Not evaluated using requested technique. Sinuses/Orbits: Not evaluated using requested technique. Other: None MRA HEAD FINDINGS The study suffers from considerable motion degradation. Right internal carotid artery is patent through the skull base and siphon region. Atherosclerotic narrowing of the siphon, anterior cerebral and middle cervical branches. Left internal carotid artery is patent through the siphon region but without atherosclerotic narrowing. There is a stent in the left middle cerebral artery which causes signal loss. Some flow can be appreciated in the more distal left MCA branches. Both vertebral arteries patent to the basilar. Limited detail of the more distal branches. IMPRESSION: 1. Considerable extension of the left middle cerebral artery territory infarction, now involving the vast majority of the cortical and subcortical distribution of the left middle cerebral artery, with sparing of the basal ganglia. Petechial blood products without frank hematoma. 2. Motion degraded MR angiography. Signal loss related 2 left MCA stent. Flow does appear to be present in the more distal left MCA branch vessels  presently. Electronically Signed   By: Nelson Chimes M.D.   On: 10/30/2020 12:58   MR BRAIN WO CONTRAST  Result Date: 10/30/2020 CLINICAL DATA:  Follow-up left MCA angioplasty. Worsening weakness in the right upper extremity. EXAM: MRI HEAD WITHOUT CONTRAST MRA HEAD WITHOUT CONTRAST TECHNIQUE: Multiplanar, multiecho pulse sequences of the brain and surrounding structures were obtained without intravenous contrast. Angiographic images of the head were obtained using MRA technique without contrast. COMPARISON:  MRI 10/24/2020. Subsequent CT and interventional studies. FINDINGS: MRI HEAD FINDINGS Brain: Considerable  extension of the left middle cerebral artery territory infarction, now involving the vast majority of the cortical and subcortical distribution of the left middle cerebral artery, with sparing of the basal ganglia. Mild swelling. Petechial blood products without frank hematoma. Vascular: Not evaluated using requested technique. Skull and upper cervical spine: Not evaluated using requested technique. Sinuses/Orbits: Not evaluated using requested technique. Other: None MRA HEAD FINDINGS The study suffers from considerable motion degradation. Right internal carotid artery is patent through the skull base and siphon region. Atherosclerotic narrowing of the siphon, anterior cerebral and middle cervical branches. Left internal carotid artery is patent through the siphon region but without atherosclerotic narrowing. There is a stent in the left middle cerebral artery which causes signal loss. Some flow can be appreciated in the more distal left MCA branches. Both vertebral arteries patent to the basilar. Limited detail of the more distal branches. IMPRESSION: 1. Considerable extension of the left middle cerebral artery territory infarction, now involving the vast majority of the cortical and subcortical distribution of the left middle cerebral artery, with sparing of the basal ganglia. Petechial blood products  without frank hematoma. 2. Motion degraded MR angiography. Signal loss related 2 left MCA stent. Flow does appear to be present in the more distal left MCA branch vessels presently. Electronically Signed   By: Nelson Chimes M.D.   On: 10/30/2020 12:58   MR BRAIN WO CONTRAST  Result Date: 10/24/2020 CLINICAL DATA:  Stroke. Neuro change, with right facial droop and aphasia. EXAM: MRI HEAD WITHOUT CONTRAST TECHNIQUE: Multiplanar, multiecho pulse sequences of the brain and surrounding structures were obtained without intravenous contrast. COMPARISON:  MRI head 10/23/2020.  CT head 10/24/2020 FINDINGS: Brain: Restricted diffusion compatible with acute infarct on the left has progressed. This involves the posterior insula as well as the deep white matter in the left posterior frontal lobe. Small areas of cortical infarct also new. No infarct in the left occipital lobe as questioned on CT. Small chronic infarct in the right cerebellum. No intracranial hemorrhage. Left hemispheric extra-axial mass is partially ossified and also has areas of fatty transformation. This is predominantly in the left frontal lobe but extends to the posterior parietal lobe. There is extensive hyperostosis of the left frontal bone. Mild mass-effect on the left frontal lobe. No midline shift. Ventricle size normal. Vascular: Normal arterial flow voids. Skull and upper cervical spine: Extensive hyperostosis left frontal bone consistent with meningioma. Sinuses/Orbits: Mucosal edema and air-fluid levels in the sphenoid sinus bilaterally. Normal orbit. Other: None IMPRESSION: Acute infarct in the left MCA territory with significant progression since yesterday. This involves the insula and deep white matter with small areas of cortical infarct. 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 Extensive hyperostosis in the left  frontal bone. No associated brain edema. Electronically Signed   By: Franchot Gallo M.D.   On: 10/24/2020 09:35   MR Brain W and Wo Contrast  Result Date: 10/23/2020 CLINICAL DATA:  TIA. Altered mental status. Left frontal parafalcine mass on CT. EXAM: MRI HEAD WITHOUT AND WITH CONTRAST TECHNIQUE: Multiplanar, multiecho pulse sequences of the brain and surrounding structures were obtained without and with intravenous contrast. CONTRAST:  26mL GADAVIST GADOBUTROL 1 MMOL/ML IV SOLN COMPARISON:  Head and neck CTA 10/23/2020 FINDINGS: Brain: There are subcentimeter acute infarcts in the posterior left insula and left parietal subcortical white matter (MCA territory). There is are small chronic bilateral cerebellar infarcts. There  is no intracranial hemorrhage, hydrocephalus, or extra-axial fluid collection. Scattered small foci of T2 hyperintensity in the cerebral white matter bilaterally are nonspecific but compatible with mild chronic small vessel ischemic disease. An avidly enhancing extra-axial mass along the left anterior falx in the frontal region measures 2.6 x 2.2 x 2.5 cm (AP x transverse x craniocaudal) with mild edema in the adjacent left frontal white matter and minimal localized rightward midline shift. 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 This extends inferiorly and laterally into the anterior and middle cranial fossae, and there is a 1.1 cm enhancing component which projects into the left sylvian fissure. A separate enhancing calcified mass along the left aspect of the falx in the posterior frontal region measures 1.5 cm. There is mild diffuse dural thickening along the upper portion of the falx. Vascular: Major intracranial vascular flow voids are preserved. Skull and upper cervical spine: Extensive left frontal hyperostosis. Sinuses/Orbits: Unremarkable orbits. Paranasal sinuses and  mastoid air cells are clear. Other: None. IMPRESSION: 1. 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 2. 2.6 cm enhancing left frontal parafalcine mass consistent with a meningioma with mild edema and minimal rightward midline shift. This is associated with extensive dural ossification and left frontal skull hyperostosis along the anterior and lateral cerebral convexities. 3. Smaller meningiomas along the falx in the posterior left frontal region and laterally in the left sylvian fissure. 4. 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 Electronically Signed   By: Logan Bores M.D.   On: 10/23/2020 16:08   IR Intra Cran Stent  Result Date: 11/02/2020 CLINICAL DATA:  Symptomatic high-grade stenosis of the distal M1 segment of the left middle cerebral artery on CT angiogram of October 25 2020. EXAM: INTRACRANIAL STENT (INCL PTA) COMPARISON:  MRI scan of the brain of October 23, 2020, and CT angiogram head and neck of October 25, 2020. MEDICATIONS: Heparin 3,000 units IV. Vancomycin 1 g IV antibiotic was administered within 1 hour of the procedure. ANESTHESIA/SEDATION: General anesthesia CONTRAST:  Isovue 300 approximately 125 mL FLUOROSCOPY TIME:  Fluoroscopy Time: 58 minutes 24 seconds (2971 mGy). COMPLICATIONS: None immediate. TECHNIQUE: Informed written consent was obtained from the patient after a thorough discussion of the procedural risks, benefits and alternatives. All questions were addressed. Maximal Sterile Barrier Technique was utilized including caps, mask, sterile gowns, sterile gloves, sterile drape, hand hygiene and skin antiseptic. A timeout was performed prior to the initiation of the procedure. The right groin was prepped and draped in the usual sterile fashion. Thereafter using modified Seldinger technique, transfemoral access into the right common femoral artery was obtained without difficulty. Over a 0.035 inch guidewire, an 8  French 25 cm Pinnacle sheath was inserted. Through this, and also over 0.035 inch guidewire, a 5 [place] JB 1 catheter was advanced to the aortic arch region and selectively positioned in the right common carotid artery, into the left common carotid artery. FINDINGS: The right common carotid arteriogram demonstrates the right external carotid artery and its major branches to be widely patent. The right internal carotid artery at the bulb demonstrates mild atherosclerotic irregularity just distal to the bulb. More distally the vessel is seen to opacify normally to the cranial skull base. The petrous, cavernous and supraclinoid segments are widely patent. The right middle cerebral artery demonstrates severe 90% stenosis at its proximal aspect. More distally the middle  cerebral artery branches opacify into the capillary and venous phases with scattered arteriosclerotic irregularity involving the inferior division. The right anterior cerebral artery opacifies into the capillary and venous phases. The left common carotid arteriogram demonstrates the left external carotid artery and its major branches to be widely patent. The left internal carotid artery at the bulb demonstrates approximately 50% stenosis by the NASCET criteria. No intraluminal filling defects, or of ulcerations seen. More distally the vessel is seen to opacify to the cranial skull base. The petrous, cavernous and the supraclinoid segments are widely patent. The left middle cerebral artery in its proximal 2/3 is widely patent. There is a severe pre occlusive stenosis just proximal to the left MCA bifurcation. Slow hemodynamic flow is seen distal to this severe high-grade stenosis. The left anterior cerebral artery opacifies into the capillary and venous phases. Mass effect on the left anterior cerebral artery A2 segment is seen from left to right secondary to the left frontal convexity meningioma. The venous phase demonstrates no evidence of obstruction to  the venous outflow from the superior sagittal sinus, the inferior sagittal sinus, the straight sinus or the transverse or sigmoid sinuses. ENDOVASCULAR REVASCULARIZATION OF PRE OCCLUSIVE SYMPTOMATIC HIGH-GRADE STENOSIS OF THE LEFT MIDDLE CEREBRAL ARTERY DISTAL M1 SEGMENT. The diagnostic JB 1 catheter in the left common carotid artery was exchanged over a 0.035 inch 300 cm Rosen exchange guidewire for an 8 French 80 cm Neuron Max sheath. The sheath was advanced to the left common carotid bifurcation. The guidewire was removed. Good aspiration obtained from the hub of the Neuron Max sheath. A gentle contrast injection demonstrated no evidence of spasms, dissections or of intraluminal filling defects. Over a 0.035 inch Roadrunner guidewire, a 115 cm 5 [place] Catalyst guide catheter was then advanced and positioned in the horizontal petrous segment of the left ICA. The guidewire was removed. Good aspiration obtained from the hub of the 5 [place] Catalyst guide catheter. A control arteriogram performed through this demonstrated no change in the intracranial circulations. Mid five views were obtained in various projections to elucidate the angiographic architecture of the high-grade left MCA stenosis Eventually it was decided to proceed with initial intracranial angioplasty with 1.5 mm x 15 mm Gateway balloon microcatheter. This was then prepped and purged with heparinized saline infusion. Over a 0.014 inch standard Synchro micro guidewire with a J configuration, the Gateway balloon catheter was advanced in combination to the supraclinoid left ICA. The micro guidewire was then gently manipulated with a torque device and attempts were made to pass the high-grade stenosis without success. This guidewire was then removed. It was replaced with an Aristotle 014 inch micro guidewire with a J configuration. This was then advanced through the 5 [place] Catalyst guide catheter in the supraclinoid left ICA into the left middle  cerebral artery. Using a torque device, the wire was advanced past the high-grade stenosis followed by the balloon markers. The central marker was positioned at the site of the severe high-grade stenosis. A control inflation was then performed using micro inflation syringe device via micro tubing. Balloon was expanded to 5 atmospheres where it was maintained for approximately a minute and a half. This was then deflated and retrieved proximally. A control arteriogram performed through the Catalyst guide catheter demonstrated near complete occlusion of the distal left M1 segment with pitiful flow into the distal left MCA distribution. The Gateway balloon was then removed. An 021 Headway microcatheter was then advanced over a 0.014 inch Aristotle micro guidewire with a  J-tip configuration to the supraclinoid left ICA. The micro guidewire was then gently manipulated followed by the microcatheter. Eventually access was obtained through the occluded left middle cerebral artery M1 segment into the inferior division M2 M3 region followed by the microcatheter. The guidewire was removed. Good aspiration obtained from the hub of the microcatheter. Gentle contrast injection demonstrated spasm at the tip of the microcatheter. The microcatheter was then gently retrieved more proximally into the more normal segment of the inferior division. A control arteriogram performed at this site now demonstrated free flow into the distal branches. The microcatheter was then connected to continuous heparinized saline infusion. It was decided to proceed with placement of a 4 mm x 24 mm Neuroform Atlas stent. This was prepped and purged in heparinized saline infusion. Using biplane roadmap technique and constant fluoroscopic guidance this was then advanced to the distal end of the microcatheter. The proximal and distal markers were then aligned with adequate coverage of the previously noted severe high-grade stenosis. The O ring on the delivery  microcatheter was loosened. Using slight forward gentle traction with the right hand on the delivery micro guidewire with the left hand the microcatheter was gently retrieved from distal to proximal. Once deployed, a control arteriogram performed through the 5 [place] Catalyst guide catheter now demonstrated excellent apposition proximally and distally with free flow through the stented segment. A TICI 2C revascularization was achieved. Distal wire, and the delivery microcatheter were gently retrieved and removed. No evidence of stent movement was evident. The 5 [place] Catalyst guide was also removed. A control arteriogram was then performed at 15 and 30 minutes post deployment of the stent. These continued to demonstrate excellent flow through the stented segment, and improved flow in the left MCA distribution. Filling defect noted in the proximal superior division gradually dissolved with improved flow also. A final control arteriogram performed through the Neuron Max sheath in the left common carotid artery demonstrated excellent flow through the internal carotid artery extra cranially and intracranially, and in the left middle and left anterior cerebral artery distributions. The left MCA remained widely open at the site of the stent without evidence of intra stent filling defects. Also the small filling defect in the proximal superior division also demonstrated dissolution. The Neuron Max sheath was removed. The 8 French Pinnacle sheath was replaced with an 8 [place] Angio-Seal closure device. Distal pulses remained Dopplerable in both feet unchanged. A CT of the brain performed demonstrated no evidence of hemorrhage, mass effect or midline shift. Contrast stain in the anterior temporal, and the temporal subcortical white matter probably represents luxury perfusion. Patient's general anesthesia was then reversed. The patient was extubated. Upon extubation, the patient was able to move her left arm and left leg  spontaneously. No significant motion was seen in the right upper or right lower extremities. The patient was then transferred to PACU and then neuro ICU for post treatment management. IMPRESSION: Status post endovascular revascularization of pre occlusive distal left M1 stenosis with angioplasty followed by stenting achieving a TICI 2C revascularization. PLAN: Follow-up in the clinic 2-4 weeks post discharge. Electronically Signed   By: Luanne Bras M.D.   On: 11/01/2020 16:41   DG Swallowing Func-Speech Pathology  Result Date: 11/17/2020 Objective Swallowing Evaluation: Type of Study: MBS-Modified Barium Swallow Study  Patient Details Name: Brittany Griffith MRN: [id number] Date of Birth: [birth year] Today's Date: 11/17/2020 Time: SLP Start Time (ACUTE ONLY): 9702 -SLP Stop Time (ACUTE ONLY): 1109 SLP Time Calculation (min) (ACUTE ONLY): 30  min Past Medical History: Past Medical History: Diagnosis Date . Hypercholesteremia  . Hypertension  . Osteopenia  Past Surgical History: Past Surgical History: Procedure Laterality Date . IR ANGIO INTRA EXTRACRAN SEL COM CAROTID INNOMINATE UNI R MOD SED  10/26/2020 . IR INTRA CRAN STENT  10/26/2020 . RADIOLOGY WITH ANESTHESIA Left 10/26/2020  Procedure: RADIOLOGY WITH ANESTHESIA LEFT MCA STENT;  Surgeon: Luanne Bras, MD;  Location: [facility];  Service: Radiology;  Laterality: Left; HPI: Patient is a 70 y.o. female with PMH: HLD, HTN, osteopenia who presented to hospital with complaints of speech difficulty and right sided weakness but on EMS arrival symptoms had completely resolved. In ER later in the day, MD reported patient started to have difficulty with getting her words out. 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 The next day (3/14) patient had abnormal neuro checks in AM and repeat MRI showed significant progression of acute infarct in the left MCA territory but negative for hemorrhagic transformation. On 3/16 pt underwent Patient  underwent an image-guided cerebral arteriogram with revascularization of right MCA M1. Repeat head CT 3/17 revealed Hyperdense vessel left middle cerebral artery consistent with  occlusion of this vessel and extensive infarct throughout much of the left temporal lobe,  posterior left frontal lobe, anterior left parietal lobe consistent  with evolving left middle cerebral artery distribution infarct.  6 mm of midline  shift toward the right  Subjective: pleasant, alert husband in room Assessment / Plan / Recommendation CHL IP CLINICAL IMPRESSIONS 11/17/2020 Clinical Impression Patient demonstrates improved oropharyngeal swallow but continues with an oropharyngeal dysphagia. Patient would hold liquids and solids in anterior portion of oral cavity for brief period of time before swallowing; no significant oral residuals observed after swallows. Mechanical soft solids bolus, honey thick liquids, puree solids bolus and puree and barium tablet bolus all transited pharyngeally without difficulty. Patient did exhibit flash penetration with thin liquids but with full clearance of penetrate. Patient exhibited decreased decreased laryngeal elevation and delay in closure of laryngeal vestibule by epiglottis during swallows of thin liquids and nectar thick liquids, leading to flash penetration but no aspiration. Only trace pharyngeal residuals remained after swallows of all tested boluses. Barium tablet taken with puree solids transited pharyngeally without difficulty. Paitent is safe to upgrade to thin liquids at this time. SLP Visit Diagnosis Dysphagia, oropharyngeal phase (R13.12) Attention and concentration deficit following -- Frontal lobe and executive function deficit following -- Impact on safety and function Mild aspiration risk   CHL IP TREATMENT RECOMMENDATION 11/17/2020 Treatment Recommendations Therapy as outlined in treatment plan below   Prognosis 10/28/2020 Prognosis for Safe Diet Advancement Good Barriers to Reach  Goals -- Barriers/Prognosis Comment -- CHL IP DIET RECOMMENDATION 11/17/2020 SLP Diet Recommendations Dysphagia 2 (Fine chop) solids;Thin liquid Liquid Administration via Straw;Cup Medication Administration Whole meds with puree Compensations Minimize environmental distractions;Slow rate;Small sips/bites Postural Changes Seated upright at 90 degrees   CHL IP OTHER RECOMMENDATIONS 11/17/2020 Recommended Consults -- Oral Care Recommendations Oral care BID Other Recommendations --   CHL IP FOLLOW UP RECOMMENDATIONS 10/31/2020 Follow up Recommendations Inpatient Rehab   CHL IP FREQUENCY AND DURATION 10/28/2020 Speech Therapy Frequency (ACUTE ONLY) min 2x/week Treatment Duration 2 weeks      CHL IP ORAL PHASE 11/17/2020 Oral Phase Impaired Oral - Pudding Teaspoon -- Oral - Pudding Cup -- Oral - Honey Teaspoon NT Oral - Honey Cup Delayed oral transit;Holding of bolus Oral - Nectar Teaspoon NT Oral - Nectar Cup Holding  of bolus;Delayed oral transit Oral - Nectar Straw -- Oral - Thin Teaspoon NT Oral - Thin Cup Delayed oral transit;Holding of bolus Oral - Thin Straw Delayed oral transit Oral - Puree Delayed oral transit Oral - Mech Soft Delayed oral transit;Impaired mastication Oral - Regular -- Oral - Multi-Consistency -- Oral - Pill Delayed oral transit Oral Phase - Comment --  CHL IP PHARYNGEAL PHASE 11/17/2020 Pharyngeal Phase Impaired Pharyngeal- Pudding Teaspoon -- Pharyngeal -- Pharyngeal- Pudding Cup -- Pharyngeal -- Pharyngeal- Honey Teaspoon NT Pharyngeal -- Pharyngeal- Honey Cup [facility] Pharyngeal Material does not enter airway Pharyngeal- Nectar Teaspoon NT Pharyngeal Material enters airway, remains ABOVE vocal cords then ejected out Pharyngeal- Nectar Cup Reduced laryngeal elevation;Reduced airway/laryngeal closure;Penetration/Aspiration during swallow Pharyngeal Material enters airway, remains ABOVE vocal cords then ejected out Pharyngeal- Nectar Straw NT Pharyngeal -- Pharyngeal- Thin Teaspoon NT Pharyngeal -- Pharyngeal-  Thin Cup Reduced airway/laryngeal closure;Reduced laryngeal elevation;Penetration/Aspiration during swallow;Pharyngeal residue - valleculae;Pharyngeal residue - pyriform Pharyngeal Material enters airway, remains ABOVE vocal cords then ejected out Pharyngeal- Thin Straw Reduced laryngeal elevation;Penetration/Aspiration during swallow Pharyngeal Material enters airway, remains ABOVE vocal cords then ejected out Pharyngeal- Puree Pharyngeal residue - valleculae;Pharyngeal residue - pyriform Pharyngeal -- Pharyngeal- Mechanical Soft WFL Pharyngeal -- Pharyngeal- Regular -- Pharyngeal -- Pharyngeal- Multi-consistency -- Pharyngeal -- Pharyngeal- Pill WFL Pharyngeal -- Pharyngeal Comment --  CHL IP CERVICAL ESOPHAGEAL PHASE 11/17/2020 Cervical Esophageal Phase WFL Pudding Teaspoon -- Pudding Cup -- Honey Teaspoon -- Honey Cup -- Nectar Teaspoon -- Nectar Cup -- Nectar Straw -- Thin Teaspoon -- Thin Cup -- Thin Straw -- Puree -- Mechanical Soft -- Regular -- Multi-consistency -- Pill -- Cervical Esophageal Comment -- [name], MA, CCC-SLP Speech Therapy             DG Swallowing Func-Speech Pathology  Result Date: 10/28/2020 Objective Swallowing Evaluation: Type of Study: MBS-Modified Barium Swallow Study  Patient Details Name: Brittany Griffith MRN: [id number] Date of Birth: 05-08-1951 Today's Date: 10/28/2020 Time: SLP Start Time (ACUTE ONLY): 1052 -SLP Stop Time (ACUTE ONLY): 1113 SLP Time Calculation (min) (ACUTE ONLY): 21 min Past Medical History: Past Medical History: Diagnosis Date . Hypercholesteremia  . Hypertension  . Osteopenia  Past Surgical History: Past Surgical History: Procedure Laterality Date . RADIOLOGY WITH ANESTHESIA Left 10/26/2020  Procedure: RADIOLOGY WITH ANESTHESIA LEFT MCA STENT;  Surgeon: Luanne Bras, MD;  Location: [facility];  Service: Radiology;  Laterality: Left; HPI: Patient is a 70 y.o. female with PMH: HLD, HTN, osteopenia who presented to hospital with complaints of speech  difficulty and right sided weakness but on EMS arrival symptoms had completely resolved. In ER later in the day, MD reported patient started to have difficulty with getting her words out. 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 The next day (3/14) patient had abnormal neuro checks in AM and repeat MRI showed significant progression of acute infarct in the left MCA territory but negative for hemorrhagic transformation. On 3/16 pt underwent Patient underwent an image-guided cerebral arteriogram with revascularization of right MCA M1. Repeat head CT 3/17 revealed Hyperdense vessel left middle cerebral artery consistent with  occlusion of this vessel and extensive infarct throughout much of the left temporal lobe,  posterior left frontal lobe, anterior left parietal lobe consistent  with evolving left middle cerebral artery distribution infarct.  6 mm of midline  shift toward the right  Subjective: pleasant, alert husband in room Assessment / Plan / Recommendation CHL IP CLINICAL IMPRESSIONS 10/28/2020  Clinical Impression Pt's oral phase of dysphagia marked by holding due to apraxia , cognitive impairments and decreased manipulation resulting in delayed transit, lingual residue and piecemeal swallow pattern. Silent aspiration with nectar and thin liquid from impact of mildly decrease laryngeal elevation and minimally mistimed onset of laryngeal protection. Pharyngeal strength, contraction and epiglottic inversion were adequate. Pt has significant verbal apraxia and was unable to volitionally throat clear or cough and no sensory response to aspirate. Comprehension and cognitive deficits prevented compensatory strategies.There was no significant pharyngeal stasis. Recommend Dys 1 (puree) texture, honey thick liquids with full supervision and slow pace. Meds via Cortrak for now. Suspect intake will be slow to progress with prognosis is good. SLP Visit Diagnosis Dysphagia,  oropharyngeal phase (R13.12) Attention and concentration deficit following -- Frontal lobe and executive function deficit following -- Impact on safety and function Moderate aspiration risk;Mild aspiration risk   CHL IP TREATMENT RECOMMENDATION 10/28/2020 Treatment Recommendations Therapy as outlined in treatment plan below   Prognosis 10/28/2020 Prognosis for Safe Diet Advancement Good Barriers to Reach Goals -- Barriers/Prognosis Comment -- CHL IP DIET RECOMMENDATION 10/28/2020 SLP Diet Recommendations Dysphagia 1 (Puree) solids;Honey thick liquids Liquid Administration via Cup Medication Administration Via alternative means Compensations Slow rate;Minimize environmental distractions;Small sips/bites Postural Changes Seated upright at 90 degrees   CHL IP OTHER RECOMMENDATIONS 10/28/2020 Recommended Consults -- Oral Care Recommendations Oral care BID Other Recommendations Order thickener from pharmacy   CHL IP FOLLOW UP RECOMMENDATIONS 10/28/2020 Follow up Recommendations Inpatient Rehab   CHL IP FREQUENCY AND DURATION 10/28/2020 Speech Therapy Frequency (ACUTE ONLY) min 2x/week Treatment Duration 2 weeks      CHL IP ORAL PHASE 10/28/2020 Oral Phase Impaired Oral - Pudding Teaspoon -- Oral - Pudding Cup -- Oral - Honey Teaspoon Delayed oral transit Oral - Honey Cup Delayed oral transit;Holding of bolus;Piecemeal swallowing Oral - Nectar Teaspoon Decreased bolus cohesion;Piecemeal swallowing Oral - Nectar Cup Decreased bolus cohesion;Piecemeal swallowing Oral - Nectar Straw -- Oral - Thin Teaspoon Lingual/palatal residue Oral - Thin Cup Lingual/palatal residue Oral - Thin Straw -- Oral - Puree Delayed oral transit Oral - Mech Soft -- Oral - Regular -- Oral - Multi-Consistency -- Oral - Pill -- Oral Phase - Comment --  CHL IP PHARYNGEAL PHASE 10/28/2020 Pharyngeal Phase Impaired Pharyngeal- Pudding Teaspoon -- Pharyngeal -- Pharyngeal- Pudding Cup -- Pharyngeal -- Pharyngeal- Honey Teaspoon WFL Pharyngeal -- Pharyngeal-  Honey Cup Penetration/Aspiration during swallow;Reduced laryngeal elevation Pharyngeal Material enters airway, CONTACTS cords and then ejected out Pharyngeal- Nectar Teaspoon Penetration/Aspiration during swallow;Reduced laryngeal elevation Pharyngeal Material enters airway, passes BELOW cords without attempt by patient to eject out (silent aspiration) Pharyngeal- Nectar Cup Reduced laryngeal elevation;Penetration/Aspiration before swallow;Penetration/Aspiration during swallow Pharyngeal Material enters airway, passes BELOW cords without attempt by patient to eject out (silent aspiration);Material enters airway, remains ABOVE vocal cords and not ejected out Pharyngeal- Nectar Straw -- Pharyngeal -- Pharyngeal- Thin Teaspoon Penetration/Aspiration during swallow;Reduced laryngeal elevation Pharyngeal Material enters airway, remains ABOVE vocal cords then ejected out Pharyngeal- Thin Cup Reduced laryngeal elevation;Penetration/Aspiration during swallow Pharyngeal Material enters airway, passes BELOW cords without attempt by patient to eject out (silent aspiration) Pharyngeal- Thin Straw -- Pharyngeal -- Pharyngeal- Puree WFL Pharyngeal -- Pharyngeal- Mechanical Soft -- Pharyngeal -- Pharyngeal- Regular -- Pharyngeal -- Pharyngeal- Multi-consistency -- Pharyngeal -- Pharyngeal- Pill -- Pharyngeal -- Pharyngeal Comment --  CHL IP CERVICAL ESOPHAGEAL PHASE 10/28/2020 Cervical Esophageal Phase WFL Pudding Teaspoon -- Pudding Cup -- Honey Teaspoon -- Honey Cup -- Nectar Teaspoon -- Nectar Cup --  Nectar Straw -- Thin Teaspoon -- Thin Cup -- Thin Straw -- Puree -- Mechanical Soft -- Regular -- Multi-consistency -- Pill -- Cervical Esophageal Comment -- [place] Siren 10/28/2020, 1:55 PM Orbie Pyo Colvin Caroli.Ed Actor Pager [phone] Office [phone]              EEG adult  Result Date: 10/23/2020 [name], MD     10/23/2020  7:38 PM Patient Name: Brittany Griffith MRN: [id number]  Epilepsy Attending: [name] Referring Physician/Provider: DR Lesleigh Noe Date: 10/23/2020 Duration: 30.35 mins Patient history: 70 y.o. woman w/ 1h of aphasia and R sided weakness. EEG to evaluate for seizure Level of alertness: Awake, asleep AEDs during EEG study: Technical aspects: This EEG study was done with scalp electrodes positioned according to the 10-20 International system of electrode placement. Electrical activity was acquired at a sampling rate of 500Hz  and reviewed with a high frequency filter of 70Hz  and a low frequency filter of 1Hz . EEG data were recorded continuously and digitally stored. Description: The posterior dominant rhythm consists of 9-10 Hz activity of moderate voltage (25-35 uV) seen predominantly in posterior head regions, symmetric and reactive to eye opening and eye closing. Sleep was characterized by vertex waves, sleep spindles (12 to 14 Hz), maximal frontocentral region. EEG showed intermitten tleft temporal 2-3hz  delta slowing.  Hyperventilation and photic stimulation were not performed.   ABNORMALITY -Intermittent slow, left temporal IMPRESSION: This study is suggestive of cortical dysfunction in left temporal region which is non specific but could be secondary to underlying structural abnormality. No seizures or epileptiform discharges were seen throughout the recording. [name]   ECHOCARDIOGRAM COMPLETE  Result Date: 10/24/2020    ECHOCARDIOGRAM REPORT   Patient Name:   Brittany Griffith Date of Exam: 10/24/2020 Medical Rec #:  [id number]     Height:       62.0 in Accession #:    [id number]    Weight:       154.0 lb Date of Birth:  11/07/1950    BSA:          37.711 m Patient Age:    89 years      BP:           171/60 mmHg Patient Gender: F             HR:           62 bpm. Exam Location:  Inpatient Procedure: 2D Echo, Cardiac Doppler, Color Doppler and Intracardiac            Opacification Agent Indications:    Stroke  History:        Patient has no prior  history of Echocardiogram examinations.                 Risk Factors:Hypertension and Dyslipidemia.  Sonographer:    Clayton Lefort RDCS (AE) Referring Phys: [id number] [place]  1. Left ventricular ejection fraction, by estimation, is 60 to 65%. The left ventricle has normal function. The left ventricle has no regional wall motion abnormalities. Left ventricular diastolic parameters are indeterminate.  2. Right ventricular systolic function is normal. The right ventricular size is normal. There is normal pulmonary artery systolic pressure.  3. Right atrial size was mildly dilated.  4. The mitral valve is normal in structure. Trivial mitral valve regurgitation.  5. The aortic valve is normal in structure. Aortic valve regurgitation is mild. No aortic stenosis is present. FINDINGS  Left Ventricle:  Left ventricular ejection fraction, by estimation, is 60 to 65%. The left ventricle has normal function. The left ventricle has no regional wall motion abnormalities. Definity contrast agent was given IV to delineate the left ventricular  endocardial borders. The left ventricular internal cavity size was normal in size. There is borderline left ventricular hypertrophy. Left ventricular diastolic parameters are indeterminate. Right Ventricle: The right ventricular size is normal. No increase in right ventricular wall thickness. Right ventricular systolic function is normal. There is normal pulmonary artery systolic pressure. The tricuspid regurgitant velocity is 2.29 m/s, and  with an assumed right atrial pressure of 3 mmHg, the estimated right ventricular systolic pressure is 62.8 mmHg. Left Atrium: Left atrial size was normal in size. Right Atrium: Right atrial size was mildly dilated. Pericardium: There is no evidence of pericardial effusion. Mitral Valve: The mitral valve is normal in structure. Trivial mitral valve regurgitation. MV peak gradient, 2.9 mmHg. The mean mitral valve gradient is 1.0 mmHg.  Tricuspid Valve: The tricuspid valve is grossly normal. Tricuspid valve regurgitation is trivial. Aortic Valve: The aortic valve is normal in structure. Aortic valve regurgitation is mild. No aortic stenosis is present. Aortic valve mean gradient measures 3.0 mmHg. Aortic valve peak gradient measures 5.1 mmHg. Aortic valve area, by VTI measures 1.94 cm. Pulmonic Valve: The pulmonic valve was normal in structure. Pulmonic valve regurgitation is not visualized. Aorta: The aortic root and ascending aorta are structurally normal, with no evidence of dilitation. IAS/Shunts: The atrial septum is grossly normal.  LEFT VENTRICLE PLAX 2D LVIDd:         4.10 cm  Diastology LVIDs:         2.90 cm  LV e' medial:    5.87 cm/s LV PW:         1.30 cm  LV E/e' medial:  11.0 LV IVS:        1.10 cm  LV e' lateral:   6.85 cm/s LVOT diam:     1.70 cm  LV E/e' lateral: 9.4 LV SV:         55 LV SV Index:   32 LVOT Area:     2.27 cm  RIGHT VENTRICLE             IVC RV Basal diam:  2.80 cm     IVC diam: 1.00 cm RV S prime:     10.60 cm/s TAPSE (M-mode): 2.7 cm LEFT ATRIUM             Index       RIGHT ATRIUM           Index LA diam:        2.80 cm 1.64 cm/m  RA Area:     19.20 cm LA Vol (A2C):   25.7 ml 15.02 ml/m RA Volume:   56.90 ml  33.26 ml/m LA Vol (A4C):   43.4 ml 25.37 ml/m LA Biplane Vol: 33.8 ml 19.76 ml/m  AORTIC VALVE AV Area (Vmax):    2.09 cm AV Area (Vmean):   1.88 cm AV Area (VTI):     1.94 cm AV Vmax:           113.00 cm/s AV Vmean:          81.600 cm/s AV VTI:            0.282 m AV Peak Grad:      5.1 mmHg AV Mean Grad:      3.0 mmHg LVOT Vmax:  104.00 cm/s LVOT Vmean:        67.600 cm/s LVOT VTI:          0.241 m LVOT/AV VTI ratio: 0.85  AORTA Ao Root diam: 3.30 cm Ao Asc diam:  3.00 cm MITRAL VALVE               TRICUSPID VALVE MV Area (PHT): 2.87 cm    TR Peak grad:   21.0 mmHg MV Area VTI:   1.63 cm    TR Vmax:        229.00 cm/s MV Peak grad:  2.9 mmHg MV Mean grad:  1.0 mmHg    SHUNTS MV Vmax:        0.85 m/s    Systemic VTI:  0.24 m MV Vmean:      41.9 cm/s   Systemic Diam: 1.70 cm MV Decel Time: 264 msec MV E velocity: 64.70 cm/s MV A velocity: 82.30 cm/s MV E/A ratio:  0.79 Mertie Moores MD Electronically signed by Mertie Moores MD Signature Date/Time: 10/24/2020/3:36:07 PM    Final    CT ANGIO HEAD CODE STROKE  Result Date: 10/25/2020 CLINICAL DATA:  Left MCA stroke, follow-up EXAM: CT ANGIOGRAPHY HEAD TECHNIQUE: Multidetector CT imaging of the head was performed using the standard protocol during bolus administration of intravenous contrast. Multiplanar CT image reconstructions and MIPs were obtained to evaluate the vascular anatomy. CONTRAST:  33mL OMNIPAQUE IOHEXOL 350 MG/ML SOLN COMPARISON:  None. FINDINGS: CT HEAD Brain: There are increased foci of hypoattenuation in the left MCA territory corresponding to areas of acute infarction seen on prior MRI. There is no acute intracranial hemorrhage or significant mass effect. Additional patchy hypoattenuation in the supratentorial white matter likely reflects stable chronic microvascular ischemic changes. 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 Ventricles are stable in size. Stable appearance of calcified left parafalcine meningioma with extensive adjacent hyperostosis. Vascular: No new abnormality. Skull: Calvarium is unremarkable apart from hyperostosis. Sinuses/Orbits: No acute finding. Other: None. CTA HEAD There is greater artifact on this study as compared to recent prior. Anterior circulation: Intracranial internal carotid arteries are patent. Mixed plaque results in moderate stenosis the paraclinoid portions. Anterior and middle cerebral arteries are patent. Stable appearance of left greater than right M1 stenoses. Additional more distal MCA branch atherosclerotic irregularity is unchanged. Anterior cerebral arteries are patent. Middle and anterior cerebral arteries are patent. Posterior circulation: Intracranial vertebral, basilar,  and posterior cerebral arteries are patent. Atherosclerotic irregularity of the posterior cerebral arteries with bilateral P2 segment stenoses again identified. Venous sinuses: Patent as permitted by contrast timing. Review of the MIP images confirms the above findings IMPRESSION: Evolving recent left MCA territory infarctions. No acute intracranial hemorrhage or significant mass effect. Similar appearance of intracranial atherosclerosis compared to recent prior study. Electronically Signed   By: Macy Mis M.D.   On: 10/25/2020 08:47   IR ANGIO INTRA EXTRACRAN SEL COM CAROTID INNOMINATE UNI R MOD SED  Result Date: 11/02/2020 CLINICAL DATA:  Symptomatic high-grade stenosis of the distal M1 segment of the left middle cerebral artery on CT angiogram of October 25 2020. EXAM: INTRACRANIAL STENT (INCL PTA) COMPARISON:  MRI scan of the brain of October 23, 2020, and CT angiogram head and neck of October 25, 2020. MEDICATIONS: Heparin 3,000 units IV. Vancomycin 1 g IV antibiotic was administered within 1 hour of the procedure. ANESTHESIA/SEDATION: General anesthesia CONTRAST:  Isovue 300 approximately 125 mL FLUOROSCOPY TIME:  Fluoroscopy Time: 58 minutes 24 seconds (2971 mGy). COMPLICATIONS: None  immediate. TECHNIQUE: Informed written consent was obtained from the patient after a thorough discussion of the procedural risks, benefits and alternatives. All questions were addressed. Maximal Sterile Barrier Technique was utilized including caps, mask, sterile gowns, sterile gloves, sterile drape, hand hygiene and skin antiseptic. A timeout was performed prior to the initiation of the procedure. The right groin was prepped and draped in the usual sterile fashion. Thereafter using modified Seldinger technique, transfemoral access into the right common femoral artery was obtained without difficulty. Over a 0.035 inch guidewire, an 8 French 25 cm Pinnacle sheath was inserted. Through this, and also over 0.035 inch guidewire, a  5 [place] JB 1 catheter was advanced to the aortic arch region and selectively positioned in the right common carotid artery, into the left common carotid artery. FINDINGS: The right common carotid arteriogram demonstrates the right external carotid artery and its major branches to be widely patent. The right internal carotid artery at the bulb demonstrates mild atherosclerotic irregularity just distal to the bulb. More distally the vessel is seen to opacify normally to the cranial skull base. The petrous, cavernous and supraclinoid segments are widely patent. The right middle cerebral artery demonstrates severe 90% stenosis at its proximal aspect. More distally the middle cerebral artery branches opacify into the capillary and venous phases with scattered arteriosclerotic irregularity involving the inferior division. The right anterior cerebral artery opacifies into the capillary and venous phases. The left common carotid arteriogram demonstrates the left external carotid artery and its major branches to be widely patent. The left internal carotid artery at the bulb demonstrates approximately 50% stenosis by the NASCET criteria. No intraluminal filling defects, or of ulcerations seen. More distally the vessel is seen to opacify to the cranial skull base. The petrous, cavernous and the supraclinoid segments are widely patent. The left middle cerebral artery in its proximal 2/3 is widely patent. There is a severe pre occlusive stenosis just proximal to the left MCA bifurcation. Slow hemodynamic flow is seen distal to this severe high-grade stenosis. The left anterior cerebral artery opacifies into the capillary and venous phases. Mass effect on the left anterior cerebral artery A2 segment is seen from left to right secondary to the left frontal convexity meningioma. The venous phase demonstrates no evidence of obstruction to the venous outflow from the superior sagittal sinus, the inferior sagittal sinus, the straight  sinus or the transverse or sigmoid sinuses. ENDOVASCULAR REVASCULARIZATION OF PRE OCCLUSIVE SYMPTOMATIC HIGH-GRADE STENOSIS OF THE LEFT MIDDLE CEREBRAL ARTERY DISTAL M1 SEGMENT. The diagnostic JB 1 catheter in the left common carotid artery was exchanged over a 0.035 inch 300 cm Rosen exchange guidewire for an 8 French 80 cm Neuron Max sheath. The sheath was advanced to the left common carotid bifurcation. The guidewire was removed. Good aspiration obtained from the hub of the Neuron Max sheath. A gentle contrast injection demonstrated no evidence of spasms, dissections or of intraluminal filling defects. Over a 0.035 inch Roadrunner guidewire, a 115 cm 5 [place] Catalyst guide catheter was then advanced and positioned in the horizontal petrous segment of the left ICA. The guidewire was removed. Good aspiration obtained from the hub of the 5 [place] Catalyst guide catheter. A control arteriogram performed through this demonstrated no change in the intracranial circulations. Mid five views were obtained in various projections to elucidate the angiographic architecture of the high-grade left MCA stenosis Eventually it was decided to proceed with initial intracranial angioplasty with 1.5 mm x 15 mm Gateway balloon microcatheter. This was then prepped  and purged with heparinized saline infusion. Over a 0.014 inch standard Synchro micro guidewire with a J configuration, the Gateway balloon catheter was advanced in combination to the supraclinoid left ICA. The micro guidewire was then gently manipulated with a torque device and attempts were made to pass the high-grade stenosis without success. This guidewire was then removed. It was replaced with an Aristotle 014 inch micro guidewire with a J configuration. This was then advanced through the 5 [place] Catalyst guide catheter in the supraclinoid left ICA into the left middle cerebral artery. Using a torque device, the wire was advanced past the high-grade stenosis followed  by the balloon markers. The central marker was positioned at the site of the severe high-grade stenosis. A control inflation was then performed using micro inflation syringe device via micro tubing. Balloon was expanded to 5 atmospheres where it was maintained for approximately a minute and a half. This was then deflated and retrieved proximally. A control arteriogram performed through the Catalyst guide catheter demonstrated near complete occlusion of the distal left M1 segment with pitiful flow into the distal left MCA distribution. The Gateway balloon was then removed. An 021 Headway microcatheter was then advanced over a 0.014 inch Aristotle micro guidewire with a J-tip configuration to the supraclinoid left ICA. The micro guidewire was then gently manipulated followed by the microcatheter. Eventually access was obtained through the occluded left middle cerebral artery M1 segment into the inferior division M2 M3 region followed by the microcatheter. The guidewire was removed. Good aspiration obtained from the hub of the microcatheter. Gentle contrast injection demonstrated spasm at the tip of the microcatheter. The microcatheter was then gently retrieved more proximally into the more normal segment of the inferior division. A control arteriogram performed at this site now demonstrated free flow into the distal branches. The microcatheter was then connected to continuous heparinized saline infusion. It was decided to proceed with placement of a 4 mm x 24 mm Neuroform Atlas stent. This was prepped and purged in heparinized saline infusion. Using biplane roadmap technique and constant fluoroscopic guidance this was then advanced to the distal end of the microcatheter. The proximal and distal markers were then aligned with adequate coverage of the previously noted severe high-grade stenosis. The O ring on the delivery microcatheter was loosened. Using slight forward gentle traction with the right hand on the  delivery micro guidewire with the left hand the microcatheter was gently retrieved from distal to proximal. Once deployed, a control arteriogram performed through the 5 [place] Catalyst guide catheter now demonstrated excellent apposition proximally and distally with free flow through the stented segment. A TICI 2C revascularization was achieved. Distal wire, and the delivery microcatheter were gently retrieved and removed. No evidence of stent movement was evident. The 5 [place] Catalyst guide was also removed. A control arteriogram was then performed at 15 and 30 minutes post deployment of the stent. These continued to demonstrate excellent flow through the stented segment, and improved flow in the left MCA distribution. Filling defect noted in the proximal superior division gradually dissolved with improved flow also. A final control arteriogram performed through the Neuron Max sheath in the left common carotid artery demonstrated excellent flow through the internal carotid artery extra cranially and intracranially, and in the left middle and left anterior cerebral artery distributions. The left MCA remained widely open at the site of the stent without evidence of intra stent filling defects. Also the small filling defect in the proximal superior division also demonstrated dissolution. The  Neuron Max sheath was removed. The 8 French Pinnacle sheath was replaced with an 8 [place] Angio-Seal closure device. Distal pulses remained Dopplerable in both feet unchanged. A CT of the brain performed demonstrated no evidence of hemorrhage, mass effect or midline shift. Contrast stain in the anterior temporal, and the temporal subcortical white matter probably represents luxury perfusion. Patient's general anesthesia was then reversed. The patient was extubated. Upon extubation, the patient was able to move her left arm and left leg spontaneously. No significant motion was seen in the right upper or right lower extremities.  The patient was then transferred to PACU and then neuro ICU for post treatment management. IMPRESSION: Status post endovascular revascularization of pre occlusive distal left M1 stenosis with angioplasty followed by stenting achieving a TICI 2C revascularization. PLAN: Follow-up in the clinic 2-4 weeks post discharge. Electronically Signed   By: Luanne Bras M.D.   On: 11/01/2020 16:41    Labs:  Basic Metabolic Panel: Recent Labs  Lab 11/15/20 0653 11/18/20 1023 11/21/20 0618  NA  --  136 139  K  --  3.7 3.3*  CL  --  103 108  CO2  --  24 22  GLUCOSE  --  122* 96  BUN  --  11 8  CREATININE 0.70 0.72 0.69  CALCIUM  --  9.2 9.3    CBC: Recent Labs  Lab 11/18/20 1023 11/21/20 0618  WBC 13.5* 6.5  NEUTROABS 11.5* 4.2  HGB 11.3* 11.8*  HCT 33.6* 34.9*  MCV 85.9 87.3  PLT 297 272    CBG: Recent Labs  Lab 11/20/20 2106 11/21/20 0601 11/21/20 1121 11/21/20 1641 11/21/20 2059  GLUCAP 183* 94 114* 121* 168*   Family history.  Father with CVA as well as myocardial infarction.  Brother with kidney disease.  Denies any colon cancer esophageal cancer or rectal cancer  Brief HPI:   Brittany Griffith is a 70 y.o. right-handed limited English speaking female with hyperlipidemia as well as hypertension.  Per chart review lives with spouse and independent prior to admission.  1 level home one-step to entry.  Presented 10/23/2020 with right side weakness headache as well as slurred speech.  Noted blood pressure 200/77.  CT angiogram of head and neck showed left frontal parafalcine mass with extensive regional hyperostosis possibly reflecting en plaque meningioma.  Mild edema in the left frontal lobe.  Trace local rightward midline shift.  No large vessel occlusion.  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  2.6 cm enhancing left frontal parafalcine mass consistent with meningioma minimal rightward midline shift.  Neurosurgery Dr. Venetia Constable  follow-up in regards to meningioma felt to be slow-growing and would follow-up outpatient after initial evaluation by neurology services for CVA.  EEG negative for seizure maintained on Keppra for seizure prophylaxis.  Echocardiogram with ejection fraction of 60 to 65% no wall motion abnormalities.  Patient underwent cerebral angiogram 10/26/2020 followed by stent assisted angioplasty of left MCA with T TICI revascularization per interventional radiology.  Currently maintained on aspirin 81 mg daily and Brilinta 90 mg twice daily post stenting.  Cleared to begin Lovenox for DVT prophylaxis.  She was maintained on 3% saline for her cerebral edema and monitoring of sodium levels.  On 10/30/2020 patient with questionable increasing weakness right upper extremity MRI/MRI completed showing considerable extension of left middle cerebral artery territory infarction involving the vast majority of the cortical and subcortical distribution of the left middle cerebral artery with sparing  of the basal ganglia and patient remained on aspirin as well as Brilinta.  Currently on a dysphagia #1 honey thick liquid diet with nasogastric tube for nutritional support.  Therapy evaluations completed due to patient decreased functional ability right side weakness and slurred speech was admitted for a comprehensive rehab program.   Hospital Course: Uniqua Kihn was admitted to rehab 11/01/2020 for inpatient therapies to consist of PT, ST and OT at least three hours five days a week. Past admission physiatrist, therapy team and rehab RN have worked together to provide customized collaborative inpatient rehab.  Pertaining to patient's left MCA territory infarction due to severe left M1 stenosis status post stenting 11/05/2020 with right side weakness aphasia and dysphagia.  She continued to attend therapies.  Patient did have a unwitnessed fall 11/11/2020 follow-up CT scan showed no significant change no hydrocephalus.  Maintained on aspirin  as well as Brilinta per neurology services.  She had been using Lovenox during her hospital stay for DVT prophylaxis.  Her diet was slowly upgraded to a dysphagia #2 thin liquid diet.  Blood pressures controlled on Norvasc as well as Cozaar will need outpatient monitoring.  Keppra for seizure prophylaxis EEG negative.  Incidental findings meningioma she would follow-up outpatient neurosurgery Dr. Venetia Constable.  She did have some mild hyperglycemia related to tube feeds improved his diet advanced.  Urinary retention Urecholine initiated addition of Flomax.  Her Urecholine was later discontinued as voiding improved as well as Flomax.  Lipitor ongoing for hyperlipidemia.  Patient with mild persistent hypokalemia maintained on low-dose potassium supplement   Blood pressures were monitored on TID basis and soft and monitored     Rehab course: During patient's stay in rehab weekly team conferences were held to monitor patient's progress, set goals and discuss barriers to discharge. At admission, patient required moderate assist for rolling moderate assist side-lying to sitting +2 physical assist sit to stand mod max assist for feet to person hand-held assist  Physical exam.  Blood pressure 118/70 pulse 80 temperature 98.6 respirations 18 oxygen saturations 92% room air Constitutional.  No acute distress HEENT Head.  Normocephalic and atraumatic Eyes.  Pupils round and reactive to light no discharge nystagmus Neck.  Supple nontender no JVD without thyromegaly Cardiac regular rate rhythm not extra sounds or murmur heard Abdomen.  Soft nontender positive bowel sounds without rebound Respiratory effort normal no respiratory distress without wheeze Genitourinary pure wick in place Musculoskeletal.  Normal range of motion Comments.  Left upper extremity biceps, grip and finger abduction 5 -/5 also likely triceps, however patient perseverating during exam on previous command Right upper extremity 0/5 except  trace in deltoid Left lower extremity hip flexion dorsiflexion plantarflexion 5 -/5 Right lower extremity hip flexion 2/5 knee extension 2+/5 dorsiflexion 0/5 and patient could not do plantar flexion due to apraxia Neurologic.  Alert makes eye contact with examiner aphasic with language barrier follows simple demonstrated commands.  He/She  has had improvement in activity tolerance, balance, postural control as well as ability to compensate for deficits. He/She has had improvement in functional use RUE/LUE  and RLE/LLE as well as improvement in awareness.  Perform supine to sit left edge of bed head of bed flat not using bed rail with patient's husband providing proper minimal assist for trunk upright.  Discussed guarding her from sliding towards edge due to their home bed being higher.  Sitting edge of bed don pants and shoes with moderate assist from patient's husband.  Demonstrate proper sequencing without cueing  but did cue to have patient perform as much as possible.  Educated on use of gait belt for all standing ambulating transfer tasks.  Ambulates 60 feet without assistive device right upper extremity support.  Simulated home entry via stepping up and down on and off one 4 inch step 2 times with therapist in 4 times with patient husband demonstrates safe technique.  Supine to sitting edge of bed minimal assist with ADLs.  She is able to maintain midline unsupported sitting for 20 minutes contact-guard.  Patient was able to replicate nonspeech oral motor movements with moderate assist multimodal cues but was unable to consistently coordinate articulation with phonation despite max to total assist multimodal cues.  During trials of producing family members names patient was able to produce 2 distinct motor patterns for different names but was unable to successfully say [name] or Sam during session.  Full family teaching completed plan discharged home       Disposition: Discharged to  home    Diet: Dysphagia #2 thin liquid diet  Special Instructions: No driving smoking or alcohol  Medications at discharge 1.  Tylenol as needed 2.  Norvasc 5 mg p.o. daily 3.  Aspirin 81 mg p.o. daily 4.  Lipitor 80 mg p.o. daily 5.  Keppra 500 mg p.o. twice daily 6.  Cozaar 25 mg p.o. twice daily 7.  MiraLAX twice daily hold for loose stools 8.  Senokot S1 tablet p.o. nightly 9.  K. Dur 20 mEq daily 10.  Brilinta 90 mg p.o. twice daily   30-35 minutes were spent completing discharge summary and discharge planning  Discharge Instructions    Ambulatory referral to Neurology   Complete by: As directed    An appointment is requested in approximately 4 weeks left MCA/M1 stenosis with stenting   Ambulatory referral to Physical Medicine Rehab   Complete by: As directed    Moderate complexity follow-up 1 to 2 weeks left MCA infarction     Allergies as of 11/22/2020      Reactions   Penicillins    unk reaction      Medication List    STOP taking these medications   senna-docusate 8.6-50 MG tablet Commonly known as: Senokot-S     TAKE these medications   acetaminophen 325 MG tablet Commonly known as: TYLENOL Take 2 tablets (650 mg total) by mouth every 4 (four) hours as needed for mild pain (or temp > 37.5 C (99.5 F)).   amLODipine 5 MG tablet Commonly known as: NORVASC Take 1 tablet (5 mg total) by mouth daily. What changed:   medication strength  how much to take  how to take this   aspirin 81 MG chewable tablet Chew 1 tablet (81 mg total) by mouth daily.   atorvastatin 80 MG tablet Commonly known as: LIPITOR Take 1 tablet (80 mg total) by mouth daily. What changed: how to take this   levETIRAcetam 100 MG/ML solution Commonly known as: KEPPRA Take 5 mLs (500 mg total) by mouth 2 (two) times daily. What changed: how to take this   losartan 50 MG tablet Commonly known as: COZAAR Take 0.5 tablets (25 mg total) by mouth 2 (two) times daily. What  changed:   how much to take  how to take this   multivitamin with minerals tablet Take 1 tablet by mouth daily.   polyethylene glycol 17 g packet Commonly known as: MIRALAX / GLYCOLAX Place 17 g into feeding tube 2 (two) times daily. What changed:  when to take this  reasons to take this   potassium chloride SA 20 MEQ tablet Commonly known as: KLOR-CON Take 1 tablet (20 mEq total) by mouth daily.   ticagrelor 90 MG Tabs tablet Commonly known as: BRILINTA Take 1 tablet (90 mg total) by mouth 2 (two) times daily.       Follow-up Information    Kirsteins, Luanna Salk, MD Follow up.   Specialty: Physical Medicine and Rehabilitation Why: Office to call for appointment Contact information: [place] [address] [phone]        Judith Part, MD Follow up.   Specialty: Neurosurgery Why: Call for appointment Contact information: [place] [address] [phone]        Luanne Bras, MD Follow up.   Specialties: Interventional Radiology, Radiology Why: Call for appointment Contact information: [place] [address] [phone]               Signed: Lavon Paganini Faison 11/22/2020, 5:11 AM

## 2020-11-20 NOTE — Progress Notes (Signed)
PROGRESS NOTE   Subjective/Complaints: Son at bedside, noted that pt had some pain yesterday with toileting, per son, pt laughing at jokes   ROS: Limited due to being nonverbal/aphasic   Objective:   No results found. Recent Labs    11/18/20 1023  WBC 13.5*  HGB 11.3*  HCT 33.6*  PLT 297   Recent Labs    11/18/20 1023  NA 136  K 3.7  CL 103  CO2 24  GLUCOSE 122*  BUN 11  CREATININE 0.72  CALCIUM 9.2    Intake/Output Summary (Last 24 hours) at 11/20/2020 0844 Last data filed at 11/19/2020 1700 Gross per 24 hour  Intake 0 ml  Output --  Net 0 ml        Physical Exam: Vital Signs Blood pressure (!) 99/51, pulse 65, temperature 97.9 F (36.6 C), temperature source Oral, resp. rate 18, height 5\' 2"  (1.575 m), weight 61 kg, SpO2 99 %.   General: No acute distress Mood and affect are appropriate Heart: Regular rate and rhythm no rubs murmurs or extra sounds Lungs: Clear to auscultation, breathing unlabored, no rales or wheezes Abdomen: Positive bowel sounds, soft nontender to palpation, nondistended Extremities: No clubbing, cyanosis, or edema Skin:ecchymosis RIght elbow  Motor: RUE/Appear to be 0/5 proximal distal 3- RLE  LUE/LLE: Grossly 5/5 proximal distal MSK some tightness RIght shoulder with ROM but no grimace, no pain with R elbow wrist or finger ROM Global aphasia  Assessment/Plan: 1. Functional deficits which require 3+ hours per day of interdisciplinary therapy in a comprehensive inpatient rehab setting.  Physiatrist is providing close team supervision and 24 hour management of active medical problems listed below.  Physiatrist and rehab team continue to assess barriers to discharge/monitor patient progress toward functional and medical goals  Care Tool:  Bathing    Body parts bathed by patient: Chest,Abdomen,Left upper leg,Face,Right upper leg,Right arm,Right lower leg   Body parts  bathed by helper: Left lower leg,Left arm,Front perineal area,Buttocks     Bathing assist Assist Level: Minimal Assistance - Patient > 75%     Upper Body Dressing/Undressing Upper body dressing   What is the patient wearing?: Pull over shirt    Upper body assist Assist Level: Minimal Assistance - Patient > 75%    Lower Body Dressing/Undressing Lower body dressing      What is the patient wearing?: Pants     Lower body assist Assist for lower body dressing: Moderate Assistance - Patient 50 - 74%     Toileting Toileting    Toileting assist Assist for toileting: Minimal Assistance - Patient > 75%     Transfers Chair/bed transfer  Transfers assist     Chair/bed transfer assist level: Minimal Assistance - Patient > 75%     Locomotion Ambulation   Ambulation assist   Ambulation activity did not occur: N/A  Assist level: Minimal Assistance - Patient > 75% Assistive device: Walker-rolling Max distance: 87ft   Walk 10 feet activity   Assist  Walk 10 feet activity did not occur: Safety/medical concerns  Assist level: Minimal Assistance - Patient > 75% Assistive device: Walker-rolling   Walk 50 feet activity   Assist Walk 50  feet with 2 turns activity did not occur: Safety/medical concerns  Assist level: Minimal Assistance - Patient > 75% Assistive device: Walker-rolling    Walk 150 feet activity   Assist Walk 150 feet activity did not occur: Safety/medical concerns  Assist level: Minimal Assistance - Patient > 75% Assistive device: No Device    Walk 10 feet on uneven surface  activity   Assist Walk 10 feet on uneven surfaces activity did not occur: Safety/medical concerns         Wheelchair     Assist Will patient use wheelchair at discharge?: Yes (Per PT long term goals) Type of Wheelchair: Manual           Wheelchair 50 feet with 2 turns activity    Assist    Wheelchair 50 feet with 2 turns activity did not occur:  Safety/medical concerns       Wheelchair 150 feet activity     Assist  Wheelchair 150 feet activity did not occur: Safety/medical concerns       Blood pressure (!) 99/51, pulse 65, temperature 97.9 F (36.6 C), temperature source Oral, resp. rate 18, height 5\' 2"  (1.575 m), weight 61 kg, SpO2 99 %.    Medical Problem List and Plan: 1.  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  Impaired mobility:  -DVT/anticoagulation: Continue Lovenox, does have bruising             -antiplatelet therapy: Aspirin 81 mg daily and Brilinta 90 mg twice daily 3. Pain Management: Tylenol as needed  4/2-patient does not appear to be in any pain and  appears very comfortable-continue Tylenol as needed 4. Mood: Provide emotional support             -antipsychotic agents: N/A 5. Neuropsych: This patient is not capable of making decisions on her own behalf. 6. Skin/Wound Care: Routine skin checks, ecchymosis, pt with flaccid RUE will order elbow pad 7. Fluids/Electrolytes/Nutrition: Routine in and outs 8.  Dysphagia.Diet upgrade to   Dysphagia #2 thin. Recheck bmet to monitor hydration   9.  Hypertension.  Norvasc decreased to 5 on 3/25, Cozaar 50 mg twice daily.  Monitor with increased mobility   Vitals:   11/19/20 1922 11/20/20 0352  BP: (!) 108/48 (!) 99/51  Pulse: 68 65  Resp: 18 18  Temp: 98.2 F (36.8 C) 97.9 F (36.6 C)  SpO2: 97% 99%  4/10 controlled , a little soft , reduce cozaar to 25mg   10.  Seizure prophylaxis.  Keppra 500 mg twice daily.  EEG negative  per Neuro cont  Keppra, f/u neuro as OP  11.  Hyperlipidemia.  Lipitor 12.  Incidental findings meningioma.  Follow-up outpatient neurosurgery Dr. Venetia Constable 13.  Hyperglycemia related to tube  feeds.  SSI  Slightly elevated on 2/27 14. global aphasia  Mainly aphasic but with good level of alertness  4/2-discussed things that family might be able to do in the future to help aphasia 15. Urinary retention:  UA negative  Urecholine 5mg  TID, increased to 10 on 3/27, increased to QID 3/28 Added flomax-started to void, , anticipate good prognosis for bladder, do not anticipate long term need for bladder meds - d/c urecholine today, if still voiding d/c flomax in am  16.  Somnolent  intermittent but mainly in am   LOS: 19 days A FACE TO [place] E Brennyn Ortlieb 11/20/2020, 8:44 AM

## 2020-11-20 NOTE — Progress Notes (Signed)
Occupational Therapy Session Note  Patient Details  Name: Brittany Griffith MRN: [id number] Date of Birth: March 19, 1951  Today's Date: 11/20/2020 OT Individual Time: 8675-1982 OT Individual Time Calculation (min): 30 min    Short Term Goals: Week 3:  OT Short Term Goal 1 (Week 3): Continue working on established LTGs set at supervision to min assist.  Skilled Therapeutic Interventions/Progress Updates:    Patient sleeping in bed, son present for therapy session.  He notes that she did not sleep well last night.   Supine to sitting edge of bed with min A.  She is able to maintain midline unsupported sitting for 20 minutes CG/CS with MD and nursing providing medications.  Son has questions regarding spasticity and right arm ROM.  Reviewed positioning, spasticity management and ROM - patient with scapula rotated and winged - reviewed posture and safe ROM (encouraged below 90 at shoulder especially in sitting position due to risk for impingement) Patient with difficulty keeping her eyes open - returned to supine position with CGA.  Reviewed bed level positioning.  Patient remained in bed at close of session, bed alarm set and call bell in reach.  Missed 15 minutes of session due to fatigue.    Therapy Documentation Precautions:  Precautions Precautions: Fall,Other (comment) Precaution Comments: global aphasia, dense R hemiparesis, R inattention Restrictions Weight Bearing Restrictions: No   Therapy/Group: Individual Therapy  Carlos Levering 11/20/2020, 7:41 AM

## 2020-11-20 NOTE — Progress Notes (Signed)
Speech Language Pathology Daily Session Note  Patient Details  Name: Brittany Griffith MRN: [id number] Date of Birth: 1950-12-04  Today's Date: 11/20/2020 SLP Individual Time: 1005-1050 SLP Individual Time Calculation (min): 45 min  Short Term Goals: Week 3: SLP Short Term Goal 1 (Week 3): STG=LTG due to short length of stay (d/c 4/14)  Skilled Therapeutic Interventions:  Pt was seen for skilled ST targeting communication goals.  Upon arrival, pt was resting in bed.  Son at bedside and reported that pt had abdominal pain this morning and did not sleep well last night.  Pt pointed to choices on her communication board to indicate that she wanted to sit in chair for treatment session with min assist.  Pt was able to replicate non speech oral motor movements with mod assist multimodal cues but was unable to consistently coordinate articulation with phonation despite max to total assist multimodal cues.  During trials of producing family members' names, pt was able to produce two distinct motor patterns for different names but was unable to successfully say "Nonda Lou" or "Sam" during today's session due to difficulty initiating phonation.  I suspect that fatigue was impacting pt's performance during today's treatment session although pt attempted to remain actively engaged in all structured tasks despite visible signs of lethargy.  As a result, session was ended early.  Pt was left in chair with son at bedside.    Pain Pain Assessment Pain Scale: 0-10 Pain Score: 0-No pain  Therapy/Group: Individual Therapy  Emalee Knies, Elmyra Ricks L 11/20/2020, 11:00 AM

## 2020-11-21 DIAGNOSIS — I69391 Dysphagia following cerebral infarction: Secondary | ICD-10-CM | POA: Diagnosis not present

## 2020-11-21 DIAGNOSIS — I63512 Cerebral infarction due to unspecified occlusion or stenosis of left middle cerebral artery: Secondary | ICD-10-CM | POA: Diagnosis not present

## 2020-11-21 DIAGNOSIS — I10 Essential (primary) hypertension: Secondary | ICD-10-CM | POA: Diagnosis not present

## 2020-11-21 DIAGNOSIS — R339 Retention of urine, unspecified: Secondary | ICD-10-CM | POA: Diagnosis not present

## 2020-11-21 LAB — CBC WITH DIFFERENTIAL/PLATELET
Abs Immature Granulocytes: 0.03 10*3/uL (ref 0.00–0.07)
Basophils Absolute: 0 10*3/uL (ref 0.0–0.1)
Basophils Relative: 1 %
Eosinophils Absolute: 0.2 10*3/uL (ref 0.0–0.5)
Eosinophils Relative: 4 %
HCT: 34.9 % — ABNORMAL LOW (ref 36.0–46.0)
Hemoglobin: 11.8 g/dL — ABNORMAL LOW (ref 12.0–15.0)
Immature Granulocytes: 1 %
Lymphocytes Relative: 25 %
Lymphs Abs: 1.6 10*3/uL (ref 0.7–4.0)
MCH: 29.5 pg (ref 26.0–34.0)
MCHC: 33.8 g/dL (ref 30.0–36.0)
MCV: 87.3 fL (ref 80.0–100.0)
Monocytes Absolute: 0.4 10*3/uL (ref 0.1–1.0)
Monocytes Relative: 7 %
Neutro Abs: 4.2 10*3/uL (ref 1.7–7.7)
Neutrophils Relative %: 62 %
Platelets: 272 10*3/uL (ref 150–400)
RBC: 4 MIL/uL (ref 3.87–5.11)
RDW: 12.6 % (ref 11.5–15.5)
WBC: 6.5 10*3/uL (ref 4.0–10.5)
nRBC: 0 % (ref 0.0–0.2)

## 2020-11-21 LAB — BASIC METABOLIC PANEL
Anion gap: 9 (ref 5–15)
BUN: 8 mg/dL (ref 8–23)
CO2: 22 mmol/L (ref 22–32)
Calcium: 9.3 mg/dL (ref 8.9–10.3)
Chloride: 108 mmol/L (ref 98–111)
Creatinine, Ser: 0.69 mg/dL (ref 0.44–1.00)
GFR, Estimated: >60 mL/min (ref >60)
Glucose, Bld: 96 mg/dL (ref 70–99)
Potassium: 3.3 mmol/L — ABNORMAL LOW (ref 3.5–5.1)
Sodium: 139 mmol/L (ref 135–145)

## 2020-11-21 LAB — GLUCOSE, CAPILLARY
Glucose-Capillary: 94 mg/dL (ref 70–99)
Glucose-Capillary: 114 mg/dL — ABNORMAL HIGH (ref 70–99)
Glucose-Capillary: 121 mg/dL — ABNORMAL HIGH (ref 70–99)
Glucose-Capillary: 168 mg/dL — ABNORMAL HIGH (ref 70–99)

## 2020-11-21 MED ORDER — TICAGRELOR 90 MG PO TABS: 90.0000 mg | ORAL_TABLET | Freq: Two times a day (BID) | ORAL | 0 refills | Status: DC

## 2020-11-21 MED ORDER — AMLODIPINE BESYLATE 5 MG PO TABS: 5.0000 mg | ORAL_TABLET | Freq: Every day | ORAL | 0 refills | Status: AC

## 2020-11-21 MED ORDER — POTASSIUM CHLORIDE CRYS ER 20 MEQ PO TBCR: 20.0000 meq | EXTENDED_RELEASE_TABLET | Freq: Every day | ORAL | 0 refills | Status: DC

## 2020-11-21 MED ORDER — LEVETIRACETAM 100 MG/ML PO SOLN: 500.0000 mg | Freq: Two times a day (BID) | ORAL | 12 refills | Status: AC

## 2020-11-21 MED ORDER — ATORVASTATIN CALCIUM 80 MG PO TABS: 80.0000 mg | ORAL_TABLET | Freq: Every day | ORAL | 0 refills | Status: DC

## 2020-11-21 MED ORDER — POLYETHYLENE GLYCOL 3350 17 G PO PACK: 17.0000 g | PACK | Freq: Two times a day (BID) | ORAL | 0 refills | Status: AC

## 2020-11-21 MED ORDER — POTASSIUM CHLORIDE CRYS ER 20 MEQ PO TBCR
20.0000 meq | EXTENDED_RELEASE_TABLET | Freq: Every day | ORAL | Status: DC
  Administered 2020-11-21 – 2020-11-22 (×2): 20 meq via ORAL
  Filled 2020-11-21 (×2): qty 1

## 2020-11-21 MED ORDER — ACETAMINOPHEN 325 MG PO TABS: 650.0000 mg | ORAL_TABLET | ORAL | Status: DC | PRN

## 2020-11-21 MED ORDER — LOSARTAN POTASSIUM 50 MG PO TABS: 25.0000 mg | ORAL_TABLET | Freq: Two times a day (BID) | ORAL | 0 refills | Status: AC

## 2020-11-21 NOTE — Progress Notes (Signed)
Occupational Therapy Discharge Summary  Patient Details  Name: Brittany Griffith MRN: [id number] Date of Birth: 07-16-1951  Today's Date: 11/21/2020 OT Individual Time: 7253-6644 OT Individual Time Calculation (min): 59 min   Session Note:  Pt worked on shower and dressing during session with family present.  She completed supine to sit EOB with min assist and spouse assisting.  She then ambulated to the shower with min assist as well using the RW for support.  Min assist was needed for completion of all bathing tasks in sitting only with max hand over hand for integration of the RUE for washing the left arm or for holding the soap when removing the top.  Increased tone noted in the pectoral of the RUE as well during task.  Mod demonstrational cueing was needed for sequencing to remember to wash her head and use enough soap/shampoo.  Dressing completed sit to stand at the sink with again mod demonstrational cueing to begin with the right side first.  Min assist for donning pullover shirt with min assist for brief and pull up pants.  Total assist was needed for TEDs with min assist for slip on shoes.  Pt's spouse and family have to be instructed to allow pt to complete tasks and then help facilitate as needed secondary to wanting to assist too much.  Therapist stressed the importance of allowing her repetition to increase familiarity with sequencing tasks that have become difficult secondary to motor planning deficits.  She was next able to complete brushing her hair with supervision and min instructional cueing.  Pt's spouse assisted for thoroughness.  She then completed oral hygiene in standing with min guard assist and pt's spouse and daughter-in-law assisting as well.  Finished session with pt in the wheelchair and with NMES applied to the right shoulder.  She was able to tolerate for 55 mins on level 8 intensity without any adverse reactions at the below settings.  All questions were answered from the  family with regards to progress and need for continued intensive therapy.    Patient has met 9 of 11 long term goals due to improved activity tolerance, improved balance, postural control, ability to compensate for deficits, functional use of  RIGHT upper and RIGHT lower extremity, improved attention, improved awareness and improved coordination.  Patient to discharge at [facility] Assist level.  Patient's care partner is independent to provide the necessary physical and cognitive assistance at discharge.    Reasons goals not met: She continues to need max assist for use of the RUE as a stabilizer.  She also continues to need mod to max assist for carryover from day to day.   Recommendation:  Patient will benefit from ongoing skilled OT services in outpatient setting to continue to advance functional skills in the area of BADL and Reduce care partner burden.  Pt continues to demonstrate moderate impairments with cognition as well as decreased RUE functional use and dependency for ADL completion.  Feel she will continue to benefit from outpatient OT neuromuscular re-education to continue progression of balance, cognition, and RUE functional use.    Equipment: 3:1  Reasons for discharge: treatment goals met and discharge from hospital  Patient/family agrees with progress made and goals achieved: Yes  OT Discharge Precautions/Restrictions  Precautions Precautions: Fall;Other (comment) Precaution Comments: global aphasia, dense R hemiparesis, R inattention Restrictions Weight Bearing Restrictions: No Pain Pain Assessment Pain Scale: 0-10 Pain Score: 0-No painNo report of pain ADL ADL Eating: Supervision/safety Where Assessed-Eating: Wheelchair Grooming: Supervision/safety Where  Assessed-Grooming: Wheelchair Upper Body Bathing: Minimal assistance Where Assessed-Upper Body Bathing: Chair,Shower Lower Body Bathing: Minimal assistance Where Assessed-Lower Body Bathing:  Chair,Shower Upper Body Dressing: Minimal assistance Where Assessed-Upper Body Dressing: Wheelchair Lower Body Dressing: Minimal assistance Where Assessed-Lower Body Dressing: Wheelchair,Sitting at sink,Standing at sink Toileting: Minimal assistance Where Assessed-Toileting: Medical laboratory scientific officer: Minimal Print production planner Method: Counselling psychologist: Radiographer, therapeutic: Engineer, building services: Facilities manager: Minimal assistance Social research officer, government Method: Heritage manager: Civil engineer, contracting with back Vision Baseline Vision/History: Wears glasses Wears Glasses: At all times Patient Visual Report: No change from baseline Vision Assessment?: Yes Alignment/Gaze Preference: Within Defined Limits Additional Comments: vision not formally assessed based on receptive deficits.  Pt appears to demonstrate a right visual field deficit secondary to having to search for itmes on the right side when therapist has moved them slightly.  She will scan spontaneously across midline to the right for locating people and items. Perception  Perception: Impaired Inattention/Neglect: Does not attend to right side of body;Does not attend to right visual field Comments: Still with right inattention Praxis Praxis: Impaired Praxis Impairment Details: Ideation Praxis-Other Comments: Pt unsure of what to do when given unfamiliar items such as a brush or comb that is not hers from home. Cognition Overall Cognitive Status: Difficult to assess Arousal/Alertness: Awake/alert Orientation Level: Oriented X4 Attention: Sustained Focused Attention: Appears intact Sustained Attention: Appears intact Memory: Impaired Memory Impairment: Decreased recall of new information Awareness: Impaired Awareness Impairment: Emergent impairment Problem Solving: Impaired Problem Solving Impairment: Functional  basic Executive Function: Sequencing Sequencing: Impaired Sequencing Impairment: Functional basic Safety/Judgment: Impaired Comments: Pt still with decreased carryover of hemi techniques to begin with dressing the RLE or LLE first.  Also with decreased understanding and consistency with following one step commands.  She still needs mod to max demonstrational cueing for sequencing selfcare tasks. Sensation Sensation Light Touch: Impaired Detail Light Touch Impaired Details: Impaired RUE Hot/Cold: Not tested Proprioception: Impaired Detail Proprioception Impaired Details: Impaired RUE Stereognosis: Not tested Additional Comments: Decreased ability to accurately assess sensation in the RUE secondary to aphasia.   Pt with no response when therapist squeezed her finger. Coordination Gross Motor Movements are Fluid and Coordinated: No Fine Motor Movements are Fluid and Coordinated: No Coordination and Movement Description: Pt stll with dense right hemiparesis in the UE.  She requires max hand over hand to intergrate into functional tasks. Heel Shin Test: [facility] RLE Motor  Motor Motor: Hemiplegia;Abnormal postural alignment and control;Abnormal tone Motor - Discharge Observations: R hemiparesis UE >LE Mobility  Bed Mobility Bed Mobility: Supine to Sit Rolling Right: Independent Rolling Left: Contact Guard/Touching assist Supine to Sit: Minimal Assistance - Patient > 75% Sit to Supine: Supervision/Verbal cueing Transfers Sit to Stand: Minimal Assistance - Patient > 75% Stand to Sit: Minimal Assistance - Patient > 75%  Trunk/Postural Assessment  Cervical Assessment Cervical Assessment: Within Functional Limits Thoracic Assessment Thoracic Assessment: Exceptions to [facility] (rounded shoulders) Lumbar Assessment Lumbar Assessment: Exceptions to [facility] (posterior pelvic tilt) Postural Control Postural Control: Deficits on evaluation Righting Reactions: delayed and inadequate   Balance Balance Balance Assessed: Yes Static Sitting Balance Static Sitting - Balance Support: Feet supported Static Sitting - Level of Assistance: 5: Stand by assistance Dynamic Sitting Balance Dynamic Sitting - Balance Support: Feet supported;During functional activity Dynamic Sitting - Level of Assistance:  (contact guard assist with bathing and dressing tasks) Static Standing Balance Static Standing - Balance Support: During functional activity Static Standing -  Level of Assistance: 4: Min assist Dynamic Standing Balance Dynamic Standing - Balance Support: During functional activity;No upper extremity supported Dynamic Standing - Level of Assistance: 4: Min assist Extremity/Trunk Assessment RUE Assessment RUE Assessment: Exceptions to [facility] Passive Range of Motion (PROM) Comments: WFLS for all joints Active Range of Motion (AROM) Comments: No AROM noted General Strength Comments: Pt with Brunnstrum stage II movement in the arm wiht trace shoulder movement and some supination noted, but unsure if it's voluntary as pt cannot initiate to commmand when asked.  Max hand over hand assist needed for integration into bathing tasks as a stabilizer or gross assist.  Slight flexor tone developing in the elbow as well as the pectoral. LUE Assessment LUE Assessment: Within Functional Limits General Strength Comments: WFLs for selfcare tasks.   Gissell Barra OTR/L 11/21/2020, 4:41 PM

## 2020-11-21 NOTE — Progress Notes (Signed)
PROGRESS NOTE   Subjective/Complaints: Up at sink with OT, family. Having a good morning. Was a little slower this weekend but family doesn't think she slept as well Fri/Sat  ROS: Limited due to language    Objective:   No results found. Recent Labs    11/21/20 0618  WBC 6.5  HGB 11.8*  HCT 34.9*  PLT 272   Recent Labs    11/21/20 0618  NA 139  K 3.3*  CL 108  CO2 22  GLUCOSE 96  BUN 8  CREATININE 0.69  CALCIUM 9.3    Intake/Output Summary (Last 24 hours) at 11/21/2020 1024 Last data filed at 11/21/2020 0900 Gross per 24 hour  Intake 120 ml  Output --  Net 120 ml        Physical Exam: Vital Signs Blood pressure (!) 104/57, pulse 64, temperature 97.9 F (36.6 C), temperature source Oral, resp. rate 18, height 5\' 2"  (1.575 m), weight 54.6 kg, SpO2 97 %.   Constitutional: No distress . Vital signs reviewed. HEENT: EOMI, oral membranes moist Neck: supple Cardiovascular: RRR without murmur. No JVD    Respiratory/Chest: CTA Bilaterally without wheezes or rales. Normal effort    GI/Abdomen: BS +, non-tender, non-distended Ext: no clubbing, cyanosis, or edema Psych: pleasant and cooperative Skin:ecchymosis RIght elbow  Motor: RUE/Appear to be 0/5 proximal distal 3-/5  RLE  LUE/LLE: Grossly 5/5 proximal distal MSK right shoulder tight with PROM Global aphasia ongoing  Assessment/Plan: 1. Functional deficits which require 3+ hours per day of interdisciplinary therapy in a comprehensive inpatient rehab setting.  Physiatrist is providing close team supervision and 24 hour management of active medical problems listed below.  Physiatrist and rehab team continue to assess barriers to discharge/monitor patient progress toward functional and medical goals  Care Tool:  Bathing    Body parts bathed by patient: Chest,Abdomen,Left upper leg,Face,Right upper leg,Right arm,Right lower leg   Body parts bathed  by helper: Left lower leg,Left arm,Front perineal area,Buttocks     Bathing assist Assist Level: Minimal Assistance - Patient > 75%     Upper Body Dressing/Undressing Upper body dressing   What is the patient wearing?: Pull over shirt    Upper body assist Assist Level: Minimal Assistance - Patient > 75%    Lower Body Dressing/Undressing Lower body dressing      What is the patient wearing?: Pants     Lower body assist Assist for lower body dressing: Moderate Assistance - Patient 50 - 74%     Toileting Toileting    Toileting assist Assist for toileting: Minimal Assistance - Patient > 75%     Transfers Chair/bed transfer  Transfers assist     Chair/bed transfer assist level: Minimal Assistance - Patient > 75%     Locomotion Ambulation   Ambulation assist   Ambulation activity did not occur: N/A  Assist level: Minimal Assistance - Patient > 75% Assistive device: Walker-rolling Max distance: 19ft   Walk 10 feet activity   Assist  Walk 10 feet activity did not occur: Safety/medical concerns  Assist level: Minimal Assistance - Patient > 75% Assistive device: Walker-rolling   Walk 50 feet activity   Assist Walk 50  feet with 2 turns activity did not occur: Safety/medical concerns  Assist level: Minimal Assistance - Patient > 75% Assistive device: Walker-rolling    Walk 150 feet activity   Assist Walk 150 feet activity did not occur: Safety/medical concerns  Assist level: Minimal Assistance - Patient > 75% Assistive device: No Device    Walk 10 feet on uneven surface  activity   Assist Walk 10 feet on uneven surfaces activity did not occur: Safety/medical concerns         Wheelchair     Assist Will patient use wheelchair at discharge?: Yes (Per PT long term goals) Type of Wheelchair: Manual           Wheelchair 50 feet with 2 turns activity    Assist    Wheelchair 50 feet with 2 turns activity did not occur:  Safety/medical concerns       Wheelchair 150 feet activity     Assist  Wheelchair 150 feet activity did not occur: Safety/medical concerns       Blood pressure (!) 104/57, pulse 64, temperature 97.9 F (36.6 C), temperature source Oral, resp. rate 18, height 5\' 2"  (1.575 m), weight 54.6 kg, SpO2 97 %.    Medical Problem List and Plan: 1.  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  Impaired mobility:  -DVT/anticoagulation: Continue Lovenox, does have bruising             -antiplatelet therapy: Aspirin 81 mg daily and Brilinta 90 mg twice daily 3. Pain Management: Tylenol as needed  4/11-patient does not appear to be in any pain and  appears very comfortable-continue Tylenol as needed 4. Mood: Provide emotional support             -antipsychotic agents: N/A 5. Neuropsych: This patient is not capable of making decisions on her own behalf. 6. Skin/Wound Care: Routine skin checks, ecchymosis, pt with flaccid RUE will order elbow pad 7. Fluids/Electrolytes/Nutrition:  Encourage PO  I personally reviewed the patient's labs today.    K+ 3.3---begin kdur 21meq daily 8.  Dysphagia.Diet upgraded to   Dysphagia #2 thin.   9.  Hypertension.  Norvasc decreased to 5 on 3/25, Cozaar 50 mg twice daily.  Monitor with increased mobility   Vitals:   11/20/20 1912 11/21/20 0335  BP: (!) 127/55 (!) 104/57  Pulse: 66 64  Resp: 18 18  Temp: 98.4 F (36.9 C) 97.9 F (36.6 C)  SpO2: 98% 97%  4/11 controlled with reduced cozaar   25mg   10.  Seizure prophylaxis.  Keppra 500 mg twice daily.  EEG negative  per Neuro cont  Keppra, f/u neuro as OP  11.  Hyperlipidemia.  Lipitor 12.  Incidental findings meningioma.  Follow-up outpatient neurosurgery Dr.  Venetia Constable 13.  Hyperglycemia related to tube feeds.  SSI  Slightly elevated on 2/27 14. global aphasia  Mainly aphasic but with good level of alertness  4/2-discussed things that family might be able to do in the future to help aphasia 15. Urinary retention:  UA negative  Urecholine 5mg  TID, increased to 10 on 3/27, increased to QID 3/28   4/11 voiding well, urecholine already dc'ed    -dc flomax and observe  16. AM somnolence==improved   -discussed importance of sleep,  po intake LOS: 20 days A FACE TO FACE EVALUATION WAS PERFORMED  Meredith Staggers 11/21/2020, 10:24 AM

## 2020-11-21 NOTE — Progress Notes (Signed)
Patient ID: Brittany Griffith, female   DOB: 08/10/1951, 70 y.o.   MRN: [id number]   16x16 Hemi WC ordered through [facility].  [place], [place]

## 2020-11-21 NOTE — Progress Notes (Signed)
Speech Language Pathology Discharge Summary  Patient Details  Name: Brittany Griffith MRN: [id number] Date of Birth: Aug 19, 1950  Today's Date: 11/21/2020 SLP Individual Time: 6837-2902 SLP Individual Time Calculation (min): 58 min   Skilled Therapeutic Interventions: Skilled ST services focused on education and language skills. SLP facilitated identification of ADL pictures in a field of 5, pt demonstrated 80% accuracy with mod A semantic cues, most difficulty identifying bathroom. SLP provided education to family pertaining to use of photographs verse black/white pictures to aid in comprehension. Pt was able to sing in unison "happy birthday" with 70% accuracy at a low vocal intensity. Pt was able to repeat vowels in initial /b/ CV words and x3 CVC initial /s/ words with max A visual cues for motor movements. Education was provided pertaining to diet, ways to communicate at home focusing on non-verbal communication and using automatic language task to increase expressive communication. All questions answered to satisfaction. Pt was left in room with family, call bell within reach and chair alarm set. SLP recommends to continue skilled services.    Patient has met 4 of 6 long term goals.  Patient to discharge at overall Min;Mod;Max level.  Reasons goals not met:   slow progress and due to severity of apraxia  Clinical Impression/Discharge Summary:   Pt made good progress meeting 4 out 6 goals, discharging at min A for swallow function, min-max A for expressive language, min A receptive language identification of objects/pictures and mod A for emergent awareness. 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 Pt is able to sing in unison with 70% accuracy, initiate non-speech motor movements and vowel sounds with repetition. Pt is able to occasional produce family member names, basic word level responses "yes," repeat CV,  initial /b/ speech sound words and CV/CVC initial /s/ speech sound words. Pt is able to identify common objects in fields up to 3, common objects in photographs up to 3 and common ADLs in black/white pictures in a field of 5 with 80% accuracy. Pt continues to demonstrates deficits in expressive/receptive language due to aphasia and is most limited my oral apraxia. Education was completed with family members, all questions answered to satisfaction. Pt benefited from skilled ST services in order to maximize functional independence and reduce burden of care,  Requiring 24 hour supervision at discharge with continued skilled ST services.    Care Partner:  Caregiver Able to Provide Assistance: Yes  Type of Caregiver Assistance: Physical;Cognitive  Recommendation:  Home Health SLP;24 hour supervision/assistance  Rationale for SLP Follow Up: Maximize functional communication;Maximize cognitive function and independence;Reduce caregiver burden;Maximize swallowing safety   Equipment: N/A   Reasons for discharge: Discharged from hospital   Patient/Family Agrees with Progress Made and Goals Achieved: Yes    Brittany Griffith  [facility] 11/21/2020, 4:48 PM

## 2020-11-21 NOTE — Progress Notes (Signed)
Patient ID: Brittany Griffith, female   DOB: 01-06-1951, 70 y.o.   MRN: [id number] Follow up with the patient and nurse to provide educational handouts for reference at home to manage secondary stroke risks. Nurse reviewed HTN, and HLD management and patient/family given handouts in [place]. Continue to follow along to the discharge to address educational needs. Margarito Liner

## 2020-11-21 NOTE — Progress Notes (Signed)
Physical Therapy Discharge Summary  Patient Details  Name: Brittany Griffith MRN: [id number] Date of Birth: Jun 03, 1951  Today's Date: 11/21/2020 PT Individual Time: 1100-1205; [id number] PT Individual Time Calculation (min): 65 min and 13 mins   Patient has met 9 of 9 long term goals due to improved activity tolerance, improved balance, improved postural control, increased strength, increased range of motion, decreased pain, ability to compensate for deficits, functional use of  right upper extremity and right lower extremity, improved attention, improved awareness and improved coordination.  Patient to discharge at an ambulatory level Brittany Griffith.   Patient's care partner is independent to provide the necessary physical and cognitive assistance at discharge.   Recommendation:  Patient will benefit from ongoing skilled PT services in outpatient setting to continue to advance safe functional mobility, address ongoing impairments in R hemi NMR, gait progressions, dynamic balance, stair negotiation, and minimize fall risk.  Equipment: 16x16 wc, R 1/2 lap tray  Reasons for discharge: treatment goals met and discharge from Griffith  Patient/family agrees with progress made and goals achieved: Yes  PT Discharge Precautions/Restrictions Precautions Precautions: Fall;Other (comment) Precaution Comments: global aphasia, dense R hemiparesis, R inattention Restrictions Weight Bearing Restrictions: No Vision/Perception  Perception Perception: Impaired Comments: Much improved R inattention, though continues to fluctuate at times Praxis Praxis: Impaired Praxis Impairment Details: Motor planning  Cognition Overall Cognitive Status: Difficult to assess Arousal/Alertness: Awake/alert Orientation Level: Oriented to person Focused Attention: Appears intact Sustained Attention: Appears intact Memory: Appears intact Awareness: Impaired Problem Solving: Impaired Sequencing:  Impaired Safety/Judgment: Impaired Sensation Sensation Light Touch: Appears Intact Hot/Cold: Not tested Proprioception: Appears Intact Stereognosis: Appears Intact Coordination Gross Motor Movements are Fluid and Coordinated: No Fine Motor Movements are Fluid and Coordinated: No Heel Shin Test: Brittany Griffith RLE Motor  Motor Motor: Hemiplegia;Abnormal postural alignment and control;Abnormal tone Motor - Discharge Observations: R hemiparesis UE >LE  Mobility Bed Mobility Bed Mobility: Rolling Right;Rolling Left;Sit to Supine;Supine to Sit Rolling Right: Independent Rolling Left: Contact Guard/Touching assist Supine to Sit: Supervision/Verbal cueing Sit to Supine: Supervision/Verbal cueing Transfers Transfers: Sit to Stand;Stand to Sit;Stand Pivot Transfers Sit to Stand: Contact Guard/Touching assist Stand to Sit: Contact Guard/Touching assist Stand Pivot Transfers: Contact Guard/Touching assist Transfer (Assistive device): 1 person hand held assist Locomotion  Gait Ambulation: Yes Gait Assistance: Contact Guard/Touching assist;Minimal Assistance - Patient > 75% Gait Distance (Feet): 160 Feet Assistive device: 1 person hand held assist Gait Assistance Details: Verbal cues for gait pattern;Verbal cues for technique;Verbal cues for precautions/safety;Verbal cues for sequencing Gait Gait: Yes Gait Pattern: Impaired Gait Pattern: Trunk flexed;Narrow base of support;Decreased stance time - right;Decreased step length - right;Right foot flat Gait velocity: decreased Stairs / Additional Locomotion Stairs: Yes Stairs Assistance: Minimal Assistance - Patient > 75% Stair Management Technique: No rails Number of Stairs: 8 Height of Stairs: 6 Wheelchair Mobility Wheelchair Mobility: No  Trunk/Postural Assessment  Cervical Assessment Cervical Assessment: Within Functional Limits Thoracic Assessment Thoracic Assessment: Exceptions to Brittany Griffith (rounded shoulders) Lumbar Assessment Lumbar  Assessment: Exceptions to Brittany Griffith (posterior pelvic tilt) Postural Control Postural Control: Deficits on evaluation Righting Reactions: delayed and inadequate  Balance Balance Balance Assessed: Yes Static Sitting Balance Static Sitting - Balance Support: Feet supported Static Sitting - Level of Assistance: 5: Stand by assistance Dynamic Sitting Balance Dynamic Sitting - Balance Support: Feet supported;During functional activity Dynamic Sitting - Level of Assistance:  (CGA) Static Standing Balance Static Standing - Balance Support: During functional activity Static Standing - Level of Assistance:  (CGA) Dynamic Standing Balance Dynamic  Standing - Balance Support: During functional activity;No upper extremity supported Dynamic Standing - Level of Assistance: 4: Min assist Extremity Assessment      RLE Assessment RLE Assessment: Exceptions to Brittany Griffith General Strength Comments: grossly 4-/5 LLE Assessment LLE Assessment: Within Functional Limits  Session 1: Patient received sitting up in wc, family at bedside, agreeable to PT and family ed. She denies pain and appears to be in much better spirits compared to Friday. PT transporting patient in wc to therapy gym for time management and energy conservation. Discussed wc components with family, use of gait belt and how to guard patient when ambulating, including supporting R LE. Initial gait training without RW with PT and family providing CGA/MinA. Intermittent R lateral lean noted, but patient responsive to cues to correct. PT also demonstrating gait with RW + modified hand grip, however due to R UE flaccidity, patient requiring more assist to manage walker + gait. Patient negotiating 8 steps with no handrails + MinA/ModA. She did require consistent verbal cuing to ascending with L LE and descend with R LE. Family able to demonstrate competency with assisting patient with this. Car transfer into sedan and SUV height car. Patient safe with both "sit and  swivel" method as well as balancing on R LE to advance L LE into car with CGA provided. Family demonstrated competency with this as well. PT emphasizing importance of following up with OP PT to continue progress made thus far. All verbalized understanding. Patient returning to room in wc, family present requesting no seatbelt alarm, call light within reach.   Session 2: PT notified of family member having additional questions regarding wc prior to dc. Upon entering the room, patient in bed watching tv, daughter, who was not present for family ed this AM, in the room. She reports that wc is not comfortable for patient. PT noting that unfortunately standard chairs come in standard sizes and patient would not get appropriate use out of custom wc since she is so ambulatory, hence initially recommending transport chair. PT offering to remove back pad to allow patient to sit deeper in wc. Daughter then stating that patient wouldn't be able to reach ground with B LE, however, patient unable to propel wc using hemi technique at this time, so wouldn't benefit from having her feet touch the ground anyway. Daughter asking multiple questions regarding OP PT POC and medications. PT deferring these questions to OP PT clinic and MD/RN. Daughter with multiple questions regarding therapy prognosis. PT noting that it's hard to determine whether patient will have full return of function or not and that multiple systems are involved, not just her leg/arm strength, complicating the recovery. Daughter without further questions at this time.  Debbora Dus 11/21/2020, 12:54 PM

## 2020-11-22 DIAGNOSIS — I63512 Cerebral infarction due to unspecified occlusion or stenosis of left middle cerebral artery: Secondary | ICD-10-CM | POA: Diagnosis not present

## 2020-11-22 LAB — CREATININE, SERUM
Creatinine, Ser: 0.68 mg/dL (ref 0.44–1.00)
GFR, Estimated: >60 mL/min (ref >60)

## 2020-11-22 LAB — GLUCOSE, CAPILLARY: Glucose-Capillary: 95 mg/dL (ref 70–99)

## 2020-11-22 NOTE — Progress Notes (Signed)
Inpatient Rehabilitation Care Coordinator Discharge Note  The overall goal for the admission was met for:   Discharge location: Yes, home  Length of Stay: Yes, 21 Days  Discharge activity level: Yes, Min A  Home/community participation: Yes  Services provided included: MD, RD, PT, OT, SLP, RN, CM, TR, Pharmacy, Neuropsych and SW  Financial Services: Private Insurance: [employer]  Choices offered to/list presented to:pt and family (spouse and daughter)  Follow-up services arranged: [place]  Comments (or additional information): PT OT ST Wheelchair, Bedside Commode  Patient/Family verbalized understanding of follow-up arrangements: Yes  Individual responsible for coordination of the follow-up plan: tham [phone]  Confirmed correct DME delivered: Dyanne Iha 11/22/2020    Dyanne Iha

## 2020-11-22 NOTE — Progress Notes (Signed)
PROGRESS NOTE   Subjective/Complaints:   ROS: Limited due to language    Objective:   No results found. Recent Labs    11/21/20 0618  WBC 6.5  HGB 11.8*  HCT 34.9*  PLT 272   Recent Labs    11/21/20 0618 11/22/20 0519  NA 139  --   K 3.3*  --   CL 108  --   CO2 22  --   GLUCOSE 96  --   BUN 8  --   CREATININE 0.69 0.68  CALCIUM 9.3  --     Intake/Output Summary (Last 24 hours) at 11/22/2020 0831 Last data filed at 11/22/2020 5956 Gross per 24 hour  Intake 660 ml  Output --  Net 660 ml        Physical Exam: Vital Signs Blood pressure (!) 101/51, pulse 66, temperature 98.4 F (36.9 C), temperature source Oral, resp. rate 18, height 5\' 2"  (1.575 m), weight 54.6 kg, SpO2 96 %.   General: No acute distress Mood and affect are appropriate Heart: Regular rate and rhythm no rubs murmurs or extra sounds Lungs: Clear to auscultation, breathing unlabored, no rales or wheezes Abdomen: Positive bowel sounds, soft nontender to palpation, nondistended Extremities: No clubbing, cyanosis, or edema   Skin:ecchymosis RIght elbow  Motor: RUE/Appear to be 0/5 proximal distal 3-/5  RLE  LUE/LLE: Grossly 5/5 proximal distal MSK right shoulder tight with PROM Global aphasia ongoing  Assessment/Plan: 1. Functional deficits due to L MCA infarct Stable for D/C today F/u PCP in 3-4 weeks F/u PM&R 2 weeks See D/C summary See D/C instructions  Care Tool:  Bathing    Body parts bathed by patient: Chest,Abdomen,Left upper leg,Face,Right upper leg,Right arm,Right lower leg,Front perineal area,Left lower leg   Body parts bathed by helper: Buttocks,Left arm     Bathing assist Assist Level: Minimal Assistance - Patient > 75%     Upper Body Dressing/Undressing Upper body dressing   What is the patient wearing?: Pull over shirt    Upper body assist Assist Level: Minimal Assistance - Patient > 75%    Lower  Body Dressing/Undressing Lower body dressing      What is the patient wearing?: Pants,Incontinence brief     Lower body assist Assist for lower body dressing: Minimal Assistance - Patient > 75%     Toileting Toileting    Toileting assist Assist for toileting: Minimal Assistance - Patient > 75%     Transfers Chair/bed transfer  Transfers assist     Chair/bed transfer assist level: Minimal Assistance - Patient > 75%     Locomotion Ambulation   Ambulation assist   Ambulation activity did not occur: N/A  Assist level: Minimal Assistance - Patient > 75% Assistive device: Walker-rolling Max distance: 10'   Walk 10 feet activity   Assist  Walk 10 feet activity did not occur: Safety/medical concerns  Assist level: Contact Guard/Touching assist Assistive device: Hand held assist   Walk 50 feet activity   Assist Walk 50 feet with 2 turns activity did not occur: Safety/medical concerns  Assist level: Contact Guard/Touching assist Assistive device: Hand held assist    Walk 150 feet activity  Assist Walk 150 feet activity did not occur: Safety/medical concerns  Assist level: Minimal Assistance - Patient > 75% Assistive device: Hand held assist    Walk 10 feet on uneven surface  activity   Assist Walk 10 feet on uneven surfaces activity did not occur: Safety/medical concerns   Assist level: Minimal Assistance - Patient > 75% Assistive device: Hand held assist   Wheelchair     Assist Will patient use wheelchair at discharge?: No Type of Wheelchair: Manual           Wheelchair 50 feet with 2 turns activity    Assist    Wheelchair 50 feet with 2 turns activity did not occur: Safety/medical concerns       Wheelchair 150 feet activity     Assist  Wheelchair 150 feet activity did not occur: Safety/medical concerns       Blood pressure (!) 101/51, pulse 66, temperature 98.4 F (36.9 C), temperature source Oral, resp. rate 18,  height 5\' 2"  (1.575 m), weight 54.6 kg, SpO2 96 %.    Medical Problem List and Plan: 1.  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  Impaired mobility:  -DVT/anticoagulation: Continue Lovenox, does have bruising             -antiplatelet therapy: Aspirin 81 mg daily and Brilinta 90 mg twice daily 3. Pain Management: Tylenol as needed  4/11-patient does not appear to be in any pain and  appears very comfortable-continue Tylenol as needed 4. Mood: Provide emotional support             -antipsychotic agents: N/A 5. Neuropsych: This patient is not capable of making decisions on her own behalf. 6. Skin/Wound Care: Routine skin checks, ecchymosis, pt with flaccid RUE will order elbow pad 7. Fluids/Electrolytes/Nutrition:  Encourage PO  I personally reviewed the patient's labs today.    K+ 3.3---begin kdur 53meq daily 8.  Dysphagia.Diet upgraded to   Dysphagia #2 thin.   9.  Hypertension.  Norvasc decreased to 5 on 3/25, Cozaar 50 mg twice daily.  Monitor with increased mobility   Vitals:   11/21/20 1925 11/22/20 0323  BP: (!) 115/58 (!) 101/51  Pulse: 74 66  Resp: 18 18  Temp: 98.7 F (37.1 C) 98.4 F (36.9 C)  SpO2: 96% 96%  4/11 controlled with reduced cozaar   25mg   10.  Seizure prophylaxis.  Keppra 500 mg twice daily.  EEG negative  per Neuro cont  Keppra, f/u neuro as OP  11.  Hyperlipidemia.  Lipitor 12.  Incidental findings meningioma.  Follow-up outpatient neurosurgery Dr. Venetia Constable 13.  Hyperglycemia related to tube feeds.  SSI  Slightly elevated on 2/27 14. global aphasia  Mainly aphasic but with good level of alertness  4/2-discussed things that family might be able to do in the future to help aphasia 15. Urinary retention:  Resolved off flomax  and urecholine   16. AM somnolence==improved   -discussed importance of sleep, po intake LOS: 21 days A FACE TO FACE EVALUATION WAS PERFORMED  Charlett Blake 11/22/2020, 8:31 AM

## 2020-11-22 NOTE — Progress Notes (Signed)
Patient discharged home with husband present. Information given to patient and family per PA, no questions noted. Taken down via wheelchair. Angie Fava

## 2020-11-22 NOTE — Discharge Instructions (Signed)
Drug regimen was reviewed and remains appropriate with no significant issues identified.Inpatient Rehab Discharge Instructions  Brittany Griffith Discharge date and time: No discharge date for patient encounter.   Activities/Precautions/ Functional Status: Activity: activity as tolerated Diet: Dysphagia #2 thin liquids Wound Care: routine skin checks Functional status:  ___ No restrictions     ___ Walk up steps independently ___ 24/7 supervision/assistance   ___ Walk up steps with assistance ___ Intermittent supervision/assistance  ___ Bathe/dress independently ___ Walk with walker     ___ Bathe/dress with assistance ___ Walk Independently    ___ Shower independently ___ Walk with assistance    ___ Shower with assistance ___ No alcohol     ___ Return to work/school ________  COMMUNITY REFERRALS UPON DISCHARGE:     Outpatient: PT     OT    ST                Agency: [facility]  Phone: [phone]              Appointment Date/Time: Facility to Determine at Discharge     Special Instructions: No driving smoking or alcohol STROKE/TIA DISCHARGE INSTRUCTIONS SMOKING Cigarette smoking nearly doubles your risk of having a stroke & is the single most alterable risk factor  If you smoke or have smoked in the last 12 months, you are advised to quit smoking for your health.  Most of the excess cardiovascular risk related to smoking disappears within a year of stopping.  Ask you doctor about anti-smoking medications  [place] Quit Line: 1-800-QUIT NOW  Free Smoking Cessation Classes [phone]  CHOLESTEROL Know your levels; limit fat & cholesterol in your diet  Lipid Panel     Component Value Date/Time   CHOL 248 (H) 10/24/2020 0448   TRIG 85 10/28/2020 0206   HDL 77 10/24/2020 0448   CHOLHDL 3.2 10/24/2020 0448   VLDL 12 10/24/2020 0448   LDLCALC 159 (H) 10/24/2020 0448      Many patients benefit from treatment even if their cholesterol is at  goal.  Goal: Total Cholesterol (CHOL) less than 160  Goal:  Triglycerides (TRIG) less than 150  Goal:  HDL greater than 40  Goal:  LDL (LDLCALC) less than 100   BLOOD PRESSURE American Stroke Association blood pressure target is less that 120/80 mm/Hg  Your discharge blood pressure is:     Monitor your blood pressure  Limit your salt and alcohol intake  Many individuals will require more than one medication for high blood pressure  DIABETES (A1c is a blood sugar average for last 3 months) Goal HGBA1c is under 7% (HBGA1c is blood sugar average for last 3 months)  Diabetes: No known diagnosis of diabetes    Lab Results  Component Value Date   HGBA1C 5.6 10/24/2020     Your HGBA1c can be lowered with medications, healthy diet, and exercise.  Check your blood sugar as directed by your physician  Call your physician if you experience unexplained or low blood sugars.  PHYSICAL ACTIVITY/REHABILITATION Goal is 30 minutes at least 4 days per week  Activity: Increase activity slowly, Therapies: Physical Therapy: Home Health Return to work:   Activity decreases your risk of heart attack and stroke and makes your heart stronger.  It helps control your weight and blood pressure; helps you relax and can improve your mood.  Participate in a regular exercise program.  Talk with your doctor about the best form of exercise for you (dancing, walking,  swimming, cycling).  DIET/WEIGHT Goal is to maintain a healthy weight  Your discharge diet is:  Diet Order            DIET - DYS 1 Room service appropriate? No; Fluid consistency: Honey Thick  Diet effective now                 liquids Your height is:    Your current weight is:   Your Body Mass Index (BMI) is:     Following the type of diet specifically designed for you will help prevent another stroke.  Your goal weight range is:    Your goal Body Mass Index (BMI) is 19-24.  Healthy food habits can help reduce 3 risk factors for  stroke:  High cholesterol, hypertension, and excess weight.  RESOURCES Stroke/Support Group:  Call [phone]   STROKE EDUCATION PROVIDED/REVIEWED AND GIVEN TO PATIENT Stroke warning signs and symptoms How to activate emergency medical system (call 911). Medications prescribed at discharge. Need for follow-up after discharge. Personal risk factors for stroke. Pneumonia vaccine given:  Flu vaccine given:  My questions have been answered, the writing is legible, and I understand these instructions.  I will adhere to these goals & educational materials that have been provided to me after my discharge from the hospital.      My questions have been answered and I understand these instructions. I will adhere to these goals and the provided educational materials after my discharge from the hospital.  Patient/Caregiver Signature _______________________________ Date __________  Clinician Signature _______________________________________ Date __________  Please bring this form and your medication list with you to all your follow-up doctor's appointments.

## 2020-11-24 ENCOUNTER — Telehealth: Payer: Self-pay | Admitting: *Deleted

## 2020-11-24 NOTE — Telephone Encounter (Signed)
Transitional Care call--I spoke with her daughter    1. Are you/is patient experiencing any problems since coming home? Are there any questions regarding any aspect of care? NO 2. Are there any questions regarding medications administration/dosing? Are meds being taken as prescribed? Patient should review meds with caller to confirm YES THEY HAVE MEDICATIONS 3. Have there been any falls? NO 4. Has Home Health been to the house and/or have they contacted you? If not, have you tried to contact them? Can we help you contact them? N/A, APPTS HAVE BEEN MADE FOR OUTPT NEURO REHAB 5. Are bowels and bladder emptying properly? Are there any unexpected incontinence issues? If applicable, is patient following bowel/bladder programs? NO PROBLEMS 6. Any fevers, problems with breathing, unexpected pain? NO  7. Are there any skin problems or new areas of breakdown? NO 8. Has the patient/family member arranged specialty MD follow up (ie cardiology/neurology/renal/surgical/etc)?  Can we help arrange? YES APPTS HAVE BEEN MADE WITH NEUROSURGERY, THEY HAVE CALLED [facility]UR[facility] WAITING ON CALL BACK, AND THEY HAVE CALLED [facility] ON CALL BACK. 9. Does the patient need any other services or support that we can help arrange? NO 10. Are caregivers following through as expected in assisting the patient? YES 11. Has the patient quit smoking, drinking alcohol, or using drugs as recommended? YES/ NO DRIVING  Appointment 10:20 ON Thursday 12/01/20 ARRIVE BY 10:00 TO SEE Danella Sensing NP ALERTED TO WATCH FOR PACKET FROM OFFICE WITH PAPERWORK [address] [phone]

## 2020-11-25 ENCOUNTER — Telehealth (HOSPITAL_COMMUNITY): Payer: Self-pay

## 2020-11-25 NOTE — Telephone Encounter (Signed)
Called to schedule f/u, no answer, vm full. AW

## 2020-11-29 ENCOUNTER — Other Ambulatory Visit: Payer: Self-pay

## 2020-11-29 ENCOUNTER — Ambulatory Visit: Payer: Medicare HMO

## 2020-11-29 ENCOUNTER — Ambulatory Visit: Payer: Medicare HMO | Admitting: Occupational Therapy

## 2020-11-29 ENCOUNTER — Ambulatory Visit: Payer: Medicare HMO | Attending: Physician Assistant

## 2020-11-29 DIAGNOSIS — R41841 Cognitive communication deficit: Secondary | ICD-10-CM | POA: Diagnosis present

## 2020-11-29 DIAGNOSIS — R1312 Dysphagia, oropharyngeal phase: Secondary | ICD-10-CM | POA: Diagnosis present

## 2020-11-29 DIAGNOSIS — G8929 Other chronic pain: Secondary | ICD-10-CM

## 2020-11-29 DIAGNOSIS — R2689 Other abnormalities of gait and mobility: Secondary | ICD-10-CM

## 2020-11-29 DIAGNOSIS — I69318 Other symptoms and signs involving cognitive functions following cerebral infarction: Secondary | ICD-10-CM | POA: Insufficient documentation

## 2020-11-29 DIAGNOSIS — R482 Apraxia: Secondary | ICD-10-CM | POA: Diagnosis present

## 2020-11-29 DIAGNOSIS — R4701 Aphasia: Secondary | ICD-10-CM | POA: Insufficient documentation

## 2020-11-29 DIAGNOSIS — I6602 Occlusion and stenosis of left middle cerebral artery: Secondary | ICD-10-CM

## 2020-11-29 DIAGNOSIS — M25552 Pain in left hip: Secondary | ICD-10-CM | POA: Diagnosis present

## 2020-11-29 DIAGNOSIS — R600 Localized edema: Secondary | ICD-10-CM | POA: Diagnosis present

## 2020-11-29 DIAGNOSIS — R208 Other disturbances of skin sensation: Secondary | ICD-10-CM | POA: Diagnosis present

## 2020-11-29 DIAGNOSIS — I69351 Hemiplegia and hemiparesis following cerebral infarction affecting right dominant side: Secondary | ICD-10-CM | POA: Diagnosis present

## 2020-11-29 DIAGNOSIS — M6281 Muscle weakness (generalized): Secondary | ICD-10-CM | POA: Diagnosis present

## 2020-11-29 DIAGNOSIS — R2681 Unsteadiness on feet: Secondary | ICD-10-CM | POA: Insufficient documentation

## 2020-11-29 NOTE — Therapy (Signed)
[facility] [address] [place], [place], [address] Phone: [phone]   Fax:  [phone]  Speech Language Pathology Evaluation  Patient Details  Name: Brittany Griffith MRN: [id number] Date of Birth: 1950-12-30 Referring Provider (SLP): Cathlyn Parsons, PA-C   Encounter Date: 11/29/2020   End of Session - 11/29/20 1451    Visit Number 1    Number of Visits 17    Date for SLP Re-Evaluation 02/27/21    Authorization Type [employer]    SLP Start Time 1105    SLP Stop Time  1147    SLP Time Calculation (min) 42 min    Activity Tolerance Patient limited by fatigue           Past Medical History:  Diagnosis Date  . Hypercholesteremia   . Hypertension   . Osteopenia     Past Surgical History:  Procedure Laterality Date  . IR ANGIO INTRA EXTRACRAN SEL COM CAROTID INNOMINATE UNI R MOD SED  10/26/2020  . IR INTRA CRAN STENT  10/26/2020  . RADIOLOGY WITH ANESTHESIA Left 10/26/2020   Procedure: RADIOLOGY WITH ANESTHESIA LEFT MCA STENT;  Surgeon: Luanne Bras, MD;  Location: [facility];  Service: Radiology;  Laterality: Left;    There were no vitals filed for this visit.       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 No/denies      General Information   HPI Patient is a 70 y.o. female with PMH: HLD, HTN, osteopenia who presented to hospital with complaints of speech difficulty and right sided weakness but on EMS arrival  symptoms had completely resolved. In ER later in the day, MD reported patient started to have difficulty with getting her words out. 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 The next day (3/14) patient had abnormal neuro checks in AM and repeat MRI showed significant progression of acute infarct in the left MCA territory but negative for hemorrhagic transformation. On 3/16 pt underwent Patient underwent an image-guided cerebral arteriogram with revascularization of right MCA M1. Repeat head CT 3/17 revealed Hyperdense vessel left middle cerebral artery consistent with  occlusion of this vessel and extensive infarct throughout much of the left temporal lobe,  posterior left frontal lobe, anterior left parietal lobe consistent  with evolving left middle cerebral artery distribution infarct.  6 mm of midline  shift toward the right    Mobility Status walking with mod/max A- PT/OT addressing      Balance Screen   Has the patient fallen in the past 6 months Yes    How many times? 1 in hospital      Prior Functional Status   Cognitive/Linguistic Baseline Within functional limits    Type of Home House     Lives With Spouse    Available Support Family;Friend(s)    Education college    Vocation On disability   Midwife  Cognition   Overall Cognitive Status Difficult to assess    Difficult to assess due to Impaired communication;Non-English speaking      Auditory Comprehension   Overall Auditory Comprehension Impaired    Yes/No Questions Impaired    Basic Biographical Questions 76-100% accurate    Basic Immediate Environment Questions 75-100% accurate    Complex Questions 0-24% accurate    Commands Impaired    One Step Basic Commands 75-100% accurate    Two Step Basic Commands Not tested    Conversation Simple    Interfering Components Motor planning;Processing speed    EffectiveTechniques Extra processing time;Visual/Gestural cues      Reading  Comprehension   Reading Status Not tested      Expression   Primary Mode of Expression Nonverbal - gestures      Verbal Expression   Overall Verbal Expression Impaired    Initiation Impaired    Level of Generative/Spontaneous Verbalization Word    Repetition Impaired    Naming Impairment    Responsive 0-25% accurate    Confrontation 0-24% accurate    Common Objects Able in field of 3   gestures/pointing   Convergent Not tested    Divergent Not tested    Other Naming Comments unable to imitate    Non-Verbal Means of Communication Gestures;Communication board      Written Expression   Dominant Hand Right    Written Expression Exceptions to [facility], [facility], [facility]    Self Formulation Ability Letter;Word    Interfering Components Thought organization;Utilizes non-dominant hand      Oral Motor/Sensory Function   Overall Oral Motor/Sensory Function Impaired    Labial Coordination Reduced    Lingual ROM Reduced right;Reduced left    Lingual Coordination Reduced      Motor Speech   Overall Motor Speech Impaired    Respiration Within functional limits    Phonation Low vocal intensity    Resonance Within functional limits    Articulation Impaired    Level of Impairment Word    Intelligibility Intelligibility reduced    Word 0-24% accurate    Phrase Not tested    Sentence Not tested    Conversation Not tested    Motor Planning Impaired    Level of Impairment Word    Motor Speech Errors Inconsistent;Groping for words      Standardized Assessments   Standardized Assessments  Other Assessment   [place] Evidenced-Based Assessment initiated                            SLP Short Term Goals - 11/29/20 1452      SLP SHORT TERM GOAL #1   Title Pt will use mulitmodal communication (gesture, draw, write 1st letter etc) to augment verbal expression of basic wants/needs with usual mod A for 3 sessions    Time 4    Period Weeks    Status New      SLP SHORT TERM GOAL #2   Title  Pt's caregivers will appropriately cue patient and use alternative communication means when needed with occasional min A from SLP over 2 sessions    Time 4    Period Weeks    Status New      SLP SHORT TERM GOAL #3   Title Pt will approximate one-word personally relevant responses to supplement alternatives means of communication with usual mod A over 2 sessions    Time 4    Period Weeks    Status  New      SLP SHORT TERM GOAL #4   Title Pt will ID object in field of 4 to communicate wants/needs or demonstrate understanding with 75% accuracy given occasional min A over 3 sessions    Time 4    Period Weeks    Status New      SLP SHORT TERM GOAL #5   Title Pt will tolerate trials of dysphagia 3/mechanical soft consistency with no overt s/sx of aspiration given occasional min A over 3 sessions    Time 4    Period Weeks    Status New            SLP Long Term Goals - 11/29/20 1507      SLP LONG TERM GOAL #1   Title Pt will use mulitmodal communication (gesture, draw, write 1st letter etc) to augment verbal expression to meet needs at home with occasional mod A from family.    Time 8    Period Weeks    Status New      SLP LONG TERM GOAL #2   Title Pt will correctly ID item in field of 6 to communicate wants/needs with 75% accuracy given occasional min A over 2 sessions    Time 8    Period Weeks    Status New      SLP LONG TERM GOAL #3   Title Pt will approximate 3 personally relevant one word responses to supplement alternatives means of communication with usual mod A over 2 sessions    Time 8    Period Weeks    Status New      SLP LONG TERM GOAL #4   Title Caregivers will report improvements in communication effectiveness via QOL scale by last ST session    Time 8    Period Weeks    Status New      SLP LONG TERM GOAL #5   Title Pt will tolerate safest and least restricitive diet with no overt s/sx of aspiration reported or exhibited over 3 sessions    Time 8    Period  Weeks    Status New            Plan - 11/29/20 1513    Clinical Impression Statement Brittany Griffith was referred for OPST evaluation 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 Pt was discharged from CIR on Dysphagia 2 (fine chop) and thin liquids following most recent MBSS on 11/18/20. Previous SLP notes indicate pt discharged on Dys 2 due to oral motor apraxia impacting effective mastication and oral clearance. Pt exhibited limited spontaneous attempts at communication with SLP during evaluation, with occassional eye contact and independent gesture x1 noted. PT reported patient stated "Hi" x1 during PT eval and was able to nod head yes/no with good accuracy. Difficulty following simple one-step commands also reported from PT evaluator. SLP intiated [place] Language Test this session, which revealed good accuracy for basic yes/no via gesture, adequate picture ID in field of 4 given verbal prompting, and significant oral motor apraxia impacing lingual/labial movements and automatic speech tasks. Completion of motoric one-step commands also seemingly impacted by suspected limb apraxia. SLP trialed simple writing via non-dominant hand, alphabet letter boards, and pre-constructed low tech AAC board of 8 icons, which were all largely unsuccessful. Due to severity  of apraxia, aphasia, and persistent dysphagia secondary to oral motor apraxia, SLP recommends skilled ST intervention to improve efficiency and accuracy of communication of basic wants/needs and to improve swallow function to reduce risk of aspiration and optimize QOL.    Speech Therapy Frequency 2x / week    Duration 8 weeks   or 17 total visits   Treatment/Interventions Aspiration precaution training;Trials  of upgraded texture/liquids;Oral motor exercises;Compensatory strategies;Pharyngeal strengthening exercises;Cueing hierarchy;Functional tasks;Patient/family education;Diet toleration management by SLP;Environmental controls;Cognitive reorganization;Multimodal communcation approach;Language facilitation;Compensatory techniques;Internal/external aids;SLP instruction and feedback    Potential to Achieve Goals Fair    Potential Considerations Severity of impairments;Co-morbidities    Consulted and Agree with Plan of Care Patient;Family member/caregiver           Patient will benefit from skilled therapeutic intervention in order to improve the following deficits and impairments:   Verbal apraxia  Aphasia  Dysphagia, oropharyngeal phase    Problem List Patient Active Problem List   Diagnosis Date Noted  . Right hemiplegia ([place])   . Urinary retention   . Hyperglycemia   . Essential hypertension   . Dysphagia, post-stroke   . 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 Dysphagia 11/01/2020  . Middle cerebral artery stenosis, left 10/26/2020  . Acute CVA (cerebrovascular accident) ([place]) 10/24/2020  . left MCA stroke 10/23/2020  . Meningioma ([place]) 10/23/2020  . Cerebral edema ([place]) 10/23/2020  . Global aphasia 10/23/2020  . Hypertensive urgency 10/23/2020    Alinda Deem, MA CCC-SLP 11/29/2020, 6:18 PM  [facility] [address] [place], [place], [address] Phone: [phone]   Fax:  [phone]  Name: Brittany Griffith MRN: [id number] Date of Birth: 03-17-1951

## 2020-11-29 NOTE — Therapy (Signed)
[facility] [address] [place], [place], [address] Phone: [phone]   Fax:  [phone]  Physical Therapy Evaluation  Patient Details  Name: Brittany Griffith MRN: [id number] Date of Birth: 1951-02-06 No data recorded  Encounter Date: 11/29/2020   PT End of Session - 11/29/20 1442    Visit Number 1    Number of Visits 21    Date for PT Re-Evaluation 02/07/21    Authorization Type [employer] Medicare; $30 copay per day for all disciplines, Eval /19/22,    Progress Note Due on Visit 10    PT Start Time 1015    PT Stop Time 1100    PT Time Calculation (min) 45 min    Equipment Utilized During Treatment Gait belt    Activity Tolerance Patient tolerated treatment well    Behavior During Therapy WFL for tasks assessed/performed           Past Medical History:  Diagnosis Date  . Hypercholesteremia   . Hypertension   . Osteopenia     Past Surgical History:  Procedure Laterality Date  . IR ANGIO INTRA EXTRACRAN SEL COM CAROTID INNOMINATE UNI R MOD SED  10/26/2020  . IR INTRA CRAN STENT  10/26/2020  . RADIOLOGY WITH ANESTHESIA Left 10/26/2020   Procedure: RADIOLOGY WITH ANESTHESIA LEFT MCA STENT;  Surgeon: Luanne Bras, MD;  Location: [facility];  Service: Radiology;  Laterality: Left;    There were no vitals filed for this visit.    Subjective Assessment - 11/29/20 1026    Subjective Spoke to husband, [name], on the phone who notes patient was at her normal state of health upon waking this morning around 530/6 AM.  Husband notes he fell asleep this morning and woke up around 9:30 hearing abnormal banging. He found his wife sitting on the floor and was unable to communicate or get up. Husband then lifted wife off the floor and noticed right-sided weakness and a right-sided facial droop. Symptoms all resolved within 1 hour. No previous history of TIA/CVA. she came home from inpatient rehab last Tuesday.    Patient is  accompained by: Family member   Husband and son.   Limitations Sitting;Lifting;Standing;Walking;House hold activities    How long can you sit comfortably? no issue    How long can you stand comfortably? She can't stand    How long can you walk comfortably? She can walk with family with 25% assistance with waling for 5-10 fet at home    Patient Stated Goals Get stroonger, walk better, improve indepdnence.    Currently in Pain? Yes   Pt non-verbal, nods yes and no   Pain Location Hip    Pain Orientation Left    Pain Descriptors / Indicators Aching    Pain Type Chronic pain    Pain Onset More than a month ago    Pain Frequency Intermittent                          Objective measurements completed on examination: See above findings.               PT Education - 11/29/20 1429    Education Details Educated son and husband on ambulating with quad cane.    Person(s) Educated Patient;Child(ren);Spouse    Methods Explanation;Demonstration    Comprehension Verbalized understanding            PT Short Term Goals - 11/29/20 1429  PT SHORT TERM GOAL #1   Title pt will be able to ambulate 300' with quad cane with CGA and without evidence of LOB to improve short distance ambulation.    Baseline 115' quad cane CGA-MinA for occaisonal LOB (11/29/20)    Time 5    Period Weeks    Status New    Target Date 01/03/21      PT SHORT TERM GOAL #2   Title Pt will be able to go up and down12 steps with use of 1 rail and CGA to improve stair negotiation in community    Baseline not tried (11/29/20)    Time 5    Period Weeks    Status New    Target Date 01/03/21      PT SHORT TERM GOAL #3   Title Pt and family will demo compliance with at least 5 days a week walking program to improve walking endurance and practice gait.    Baseline Household ambulator only (11/29/20)    Time 5    Period Weeks    Status New    Target Date 01/03/21             PT Long Term  Goals - 11/29/20 1433      PT LONG TERM GOAL #1   Title Pt will be able to ambulate on non compliant surface for 200 feet with appropriate AD with SBA to improve community negotiation.    Baseline not attempted (eval)    Time 10    Period Weeks    Status New    Target Date 02/07/21      PT LONG TERM GOAL #2   Title Pt will be able to ambulate 1050' with appropriate AD with SBA to improve community ambulation    Baseline 115' with quad cane CGA to min A (11/29/20)    Time 10    Period Weeks    Status New    Target Date 02/07/21      PT LONG TERM GOAL #3   Title Pt will demo 0.22m/s improvement on 10 meter walk test to improve walking speed and improve community ambulation    Baseline Not assessed on eval (11/29/20)    Time 10    Period Weeks    Status New    Target Date 02/07/21      PT LONG TERM GOAL #4   Title Pt will demo 4 points improvement on BBS to reduce fall risk    Baseline not assessed on eval (11/29/20)    Time 10    Period Weeks    Status New    Target Date 02/07/21                  Plan - 11/29/20 1437    Clinical Impression Statement Patient is a 70 y.o. female who was seen today for physical therapy evaluation and treatment for gait and mobility disorder after recent CVA. patient requires min A for safe ambulation with an assistive device. Patient requires min A with certain aspects of bed mobility and transfers. Patient has poor safety awareness and judgement which puts patient at high risk for fall. Patient will benefit from skilled PT to improve safety awareness, gait, balance and strength to improve overall function and reduce fall risk and to improve independence.    Personal Factors and Comorbidities Age;Behavior Pattern;Past/Current Experience;Time since onset of injury/illness/exacerbation;Transportation    Examination-Activity Limitations Bathing;Bed Mobility;Bend;Caring for  Others;Carry;Dressing;Hygiene/Grooming;Lift;Sleep;Squat;Stairs;Stand;Toileting;Transfers    Examination-Participation Restrictions Cleaning;Community Activity;Driving;Laundry;Medication Management;Meal  Prep;Personal Finances;Shop;Yard Work    [employer] Evolving/Moderate complexity    Clinical Decision Making Moderate    Rehab Potential Good    PT Frequency 2x / week    PT Duration Other (comment)   10 weeks   PT Treatment/Interventions ADLs/Self Care Home Management;Moist Heat;Gait training;Stair training;Functional mobility training;Therapeutic activities;Therapeutic exercise;Balance training;Neuromuscular re-education;Cognitive remediation;Patient/family education;Orthotic Fit/Training;Manual techniques;Passive range of motion;Energy conservation;Visual/perceptual remediation/compensation;Joint Manipulations    PT Next Visit Plan Perform 10 meter walk test, BBS, issue HEP    PT Home Exercise Plan TBD    Consulted and Agree with Plan of Care Patient;Family member/caregiver           Patient will benefit from skilled therapeutic intervention in order to improve the following deficits and impairments:  Abnormal gait,Decreased activity tolerance,Decreased cognition,Decreased balance,Decreased coordination,Decreased safety awareness,Decreased range of motion,Decreased mobility,Decreased knowledge of use of DME,Decreased knowledge of precautions,Decreased endurance,Decreased strength,Difficulty walking,Impaired flexibility,Impaired perceived functional ability,Increased fascial restricitons,Impaired sensation,Impaired tone,Impaired UE functional use,Postural dysfunction,Pain  Visit Diagnosis: Other abnormalities of gait and mobility  Muscle weakness (generalized)  Middle cerebral artery stenosis, left  Chronic left hip pain     Problem List Patient Active Problem List   Diagnosis Date Noted  . Right hemiplegia ([place])   . Urinary retention   . Hyperglycemia   .  Essential hypertension   . Dysphagia, post-stroke   . 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 Dysphagia 11/01/2020  . Middle cerebral artery stenosis, left 10/26/2020  . Acute CVA (cerebrovascular accident) ([place]) 10/24/2020  . left MCA stroke 10/23/2020  . Meningioma ([place]) 10/23/2020  . Cerebral edema ([place]) 10/23/2020  . Global aphasia 10/23/2020  . Hypertensive urgency 10/23/2020    Kerrie Pleasure, PT 11/29/2020, 2:46 PM  [facility] [address] [place], [place], [address] Phone: [phone]   Fax:  [phone]  Name: Adrianah Prophete MRN: [id number] Date of Birth: 11-Jun-1951

## 2020-12-01 ENCOUNTER — Encounter: Payer: Medicare HMO | Attending: Registered Nurse | Admitting: Registered Nurse

## 2020-12-01 ENCOUNTER — Encounter: Payer: Self-pay | Admitting: Registered Nurse

## 2020-12-01 ENCOUNTER — Other Ambulatory Visit: Payer: Self-pay

## 2020-12-01 VITALS — BP 120/65 | HR 70 | Temp 98.8°F | Ht 61.0 in | Wt 125.0 lb

## 2020-12-01 DIAGNOSIS — Z298 Encounter for other specified prophylactic measures: Secondary | ICD-10-CM | POA: Diagnosis present

## 2020-12-01 DIAGNOSIS — I10 Essential (primary) hypertension: Secondary | ICD-10-CM

## 2020-12-01 DIAGNOSIS — I63512 Cerebral infarction due to unspecified occlusion or stenosis of left middle cerebral artery: Secondary | ICD-10-CM | POA: Diagnosis present

## 2020-12-01 DIAGNOSIS — D329 Benign neoplasm of meninges, unspecified: Secondary | ICD-10-CM | POA: Insufficient documentation

## 2020-12-01 DIAGNOSIS — R4701 Aphasia: Secondary | ICD-10-CM | POA: Insufficient documentation

## 2020-12-01 DIAGNOSIS — G8191 Hemiplegia, unspecified affecting right dominant side: Secondary | ICD-10-CM | POA: Insufficient documentation

## 2020-12-01 NOTE — Progress Notes (Signed)
Subjective:    Patient ID: Brittany Griffith, female    DOB: 05-27-1951, 70 y.o.   MRN: [id number]  HPI: Brittany Griffith is a 70 y.o. female who is here for Transitional Care Visit of her Left Middle Cerebral Artery Stroke, Right Hemiplegia, Global Aphasia, Seizure Prophylaxis, Meningioma and Essential Hypertension. Brittany Griffith brought to [facility] on 10/23/2020 via EMS, with complaints of right sided weakness and slurred speech. Neurosurgery and Neurology consulted.  CT Head WO Contrast:  IMPRESSION: 1. Appearance suspicious for new Left PCA territory ischemia in the occipital pole. However, this might be streak artifact related to the complex left skull lesion.  2. Cytotoxic edema now evident at the posterior left insula and mildly larger than the DWI lesion yesterday.  3. Stable brain otherwise. No acute hemorrhage or increased intracranial mass effect.  4. Stable complex left frontal convexity meningioma(s) and skull hyperostosis.  MR Brain WO COntrast:  IMPRESSION: Acute infarct in the left MCA territory with significant progression since yesterday. This involves the insula and deep white matter with small areas of cortical infarct. 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 No associated brain edema.  CT Angio Head and Neck:  IMPRESSION: 1. 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 Mild edema in the left frontal lobe. Trace local rightward midline shift. Brain MRI without and with contrast is recommended for further evaluation. 2. No evidence of an acute infarct or intracranial hemorrhage. 3. No large vessel occlusion. 4. Intracranial atherosclerosis including moderate right and severe left M1 stenoses and moderate  bilateral P2 stenoses. 5. Widely patent cervical carotid and vertebral arteries. 6. 2.3 cm right thyroid nodule.   On 10/26/2020 she underwent: RADIOLOGY WITH ANESTHESIA LEFT MCA STENT, by Dr Estanislado Pandy.  Brittany Griffith was maintained on aspirin and Brilinta.   Brittany Griffith was admitted to inpatient rehabilitation on 11/01/2020 and discharged home on 11/22/2020. She is going to outpatient therapy at [facility]. Husband reports she is walking 20 feet twice a day, she is using cane. Today she arrived in wheelchair. Her husband also reports  she has pain in her right shoulder and right hip at night only. Brittany Griffith is aphasic, she nods to question appropriately, husband and Translator in the room.   Husband asked about right forearm bruise, ecchymosis is fading, he states the bruise was there from her hospitalization. We will continue to monitor, he verbalizes understanding.   Pain Inventory Average Pain 2 Pain Right Now 2 My pain is intermittent and aching  LOCATION OF PAIN  Right side, shoulder, hand, groin  BOWEL Number of stools per week: 5 Oral laxative use Yes  Type of laxative miralax Enema or suppository use No  History of colostomy No  Incontinent No   BLADDER Normal In and out cath, frequency n/a Able to self cath n/a Bladder incontinence No  Frequent urination No  Leakage with coughing No  Difficulty starting stream No  Incomplete bladder emptying No    Mobility walk with assistance use a cane ability to climb steps?  no do you drive?  no use a wheelchair needs help with transfers  Function retired I need assistance with the following:  feeding, dressing, bathing, toileting, meal prep, household duties and shopping  Neuro/Psych trouble walking  Prior Studies transitions of care  Physicians involved in your  care transitions of care   Family History  Problem Relation Age of Onset  . Stroke Father   . Heart attack Father   . Kidney  disease Brother    Social History   Socioeconomic History  . Marital status: Married    Spouse name: Not on file  . Number of children: Not on file  . Years of education: Not on file  . Highest education level: Not on file  Occupational History  . Not on file  Tobacco Use  . Smoking status: Never Smoker  . Smokeless tobacco: Never Used  Substance and Sexual Activity  . Alcohol use: Not on file  . Drug use: Not on file  . Sexual activity: Not on file  Other Topics Concern  . Not on file  Social History Narrative  . Not on file   Social Determinants of Health   Financial Resource Strain: Not on file  Food Insecurity: Not on file  Transportation Needs: Not on file  Physical Activity: Not on file  Stress: Not on file  Social Connections: Not on file   Past Surgical History:  Procedure Laterality Date  . IR ANGIO INTRA EXTRACRAN SEL COM CAROTID INNOMINATE UNI R MOD SED  10/26/2020  . IR INTRA CRAN STENT  10/26/2020  . RADIOLOGY WITH ANESTHESIA Left 10/26/2020   Procedure: RADIOLOGY WITH ANESTHESIA LEFT MCA STENT;  Surgeon: Luanne Bras, MD;  Location: [facility];  Service: Radiology;  Laterality: Left;   Past Medical History:  Diagnosis Date  . Hypercholesteremia   . Hypertension   . Osteopenia    BP 120/65   Pulse 70   Temp 98.8 F (37.1 C)   Ht 5\' 1"  (1.549 m) Comment: patient reported  Wt 125 lb (56.7 kg) Comment: patient reported  SpO2 93%   BMI 23.62 kg/m   Opioid Risk Score:   Fall Risk Score:  `1  Depression screen PHQ 2/9  No flowsheet data found.  Review of Systems  Constitutional: Negative.   HENT: Negative.   Eyes: Negative.   Respiratory: Negative.   Cardiovascular: Negative.   Gastrointestinal: Negative.   Endocrine: Negative.   Genitourinary: Negative.   Musculoskeletal: Positive for arthralgias and gait problem.  Skin: Negative.   Allergic/Immunologic: Negative.   Neurological: Positive for speech difficulty.  Psychiatric/Behavioral:  Negative.   All other systems reviewed and are negative.      Objective:   Physical Exam Vitals and nursing note reviewed.  Constitutional:      Appearance: Normal appearance.  Cardiovascular:     Rate and Rhythm: Normal rate and regular rhythm.     Pulses: Normal pulses.     Heart sounds: Normal heart sounds.  Pulmonary:     Effort: Pulmonary effort is normal.     Breath sounds: Normal breath sounds.  Musculoskeletal:     Cervical back: Normal range of motion and neck supple.     Comments: Normal Muscle Bulk and Muscle Testing Reveals:  Upper Extremities: Right Upper Extremity: Paralysis an Muscle Strength 0/5 Left Upper Extremity: Full  ROM and Muscle Strength 5/5 Lower Extremities: Right Lower Extremity: Decreased ROM and Muscle Strength 4/5 Left Lower Extremity: Full ROM and Muscle Strength 5/5 Arrived in wheelchair   Skin:    General: Skin is warm and dry.  Neurological:     Mental Status: She is alert and oriented to person, place, and time.  Psychiatric:        Mood and Affect: Mood normal.  Behavior: Behavior normal.           Assessment & Plan:  1. Left Middle Cerebral Artery Stroke:Right Hemiplegia: Global Aphasia:  S/P On 10/26/2020 she underwent: RADIOLOGY WITH ANESTHESIA LEFT MCA STENT, by Dr Estanislado Pandy.  Brittany Griffith was maintained on aspirin and Brilinta. Has a scheduled appointment with Neurology. Continue outpatient therapy with [facility]. 2.Seizure Prophylaxis: Continue Keppra, she has a scheduled appointment with Neurology. Continue to monitor. 3.Essential Hypertension.Continue current medication regimen. PCP Following. Continue to Monitor. 4. Meningioma: She has a scheduled appointment with Neurosurgery.  Continue to Monitor.   F/U with Dr Letta Pate in 4- 6 weeks

## 2020-12-05 ENCOUNTER — Encounter: Payer: Medicare HMO | Admitting: Occupational Therapy

## 2020-12-05 ENCOUNTER — Ambulatory Visit: Payer: Medicare HMO

## 2020-12-07 ENCOUNTER — Ambulatory Visit: Payer: Medicare HMO

## 2020-12-07 ENCOUNTER — Ambulatory Visit: Payer: Medicare HMO | Admitting: Occupational Therapy

## 2020-12-07 ENCOUNTER — Other Ambulatory Visit: Payer: Self-pay

## 2020-12-07 DIAGNOSIS — I69318 Other symptoms and signs involving cognitive functions following cerebral infarction: Secondary | ICD-10-CM

## 2020-12-07 DIAGNOSIS — R41841 Cognitive communication deficit: Secondary | ICD-10-CM

## 2020-12-07 DIAGNOSIS — R482 Apraxia: Secondary | ICD-10-CM

## 2020-12-07 DIAGNOSIS — R4701 Aphasia: Secondary | ICD-10-CM

## 2020-12-07 DIAGNOSIS — R2689 Other abnormalities of gait and mobility: Secondary | ICD-10-CM | POA: Diagnosis not present

## 2020-12-07 DIAGNOSIS — M6281 Muscle weakness (generalized): Secondary | ICD-10-CM

## 2020-12-07 DIAGNOSIS — I69351 Hemiplegia and hemiparesis following cerebral infarction affecting right dominant side: Secondary | ICD-10-CM

## 2020-12-07 DIAGNOSIS — R208 Other disturbances of skin sensation: Secondary | ICD-10-CM

## 2020-12-07 DIAGNOSIS — R600 Localized edema: Secondary | ICD-10-CM

## 2020-12-07 DIAGNOSIS — R2681 Unsteadiness on feet: Secondary | ICD-10-CM

## 2020-12-07 NOTE — Therapy (Deleted)
  OUTPATIENT PHYSICAL THERAPY TREATMENT NOTE   Patient Name: Brittany Griffith MRN: [id number] DOB:10-11-1950, 70 y.o., female Today's Date: 12/07/2020  PCP: [facility] REFERRING PROVIDER: [employer], [place] of Session - 12/07/20 1104    Visit Number 2    Number of Visits 21    Date for PT Re-Evaluation 02/07/21    Authorization Type [employer] Medicare; $30 copay per day for all disciplines, Eval /19/22,    Progress Note Due on Visit 10    PT Start Time 1100    PT Stop Time 1145    PT Time Calculation (min) 45 min    Equipment Utilized During Treatment Gait belt    Activity Tolerance Patient tolerated treatment well    Behavior During Therapy WFL for tasks assessed/performed           Past Medical History:  Diagnosis Date  . Hypercholesteremia   . Hypertension   . Osteopenia    Past Surgical History:  Procedure Laterality Date  . IR ANGIO INTRA EXTRACRAN SEL COM CAROTID INNOMINATE UNI R MOD SED  10/26/2020  . IR INTRA CRAN STENT  10/26/2020  . RADIOLOGY WITH ANESTHESIA Left 10/26/2020   Procedure: RADIOLOGY WITH ANESTHESIA LEFT MCA STENT;  Surgeon: Luanne Bras, MD;  Location: [facility];  Service: Radiology;  Laterality: Left;   Patient Active Problem List   Diagnosis Date Noted  . Right hemiplegia ([place])   . Urinary retention   . Hyperglycemia   . Essential hypertension   . Dysphagia, post-stroke   . 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 Left middle cerebral artery stroke ([place]) 11/01/2020  . Dysphagia 11/01/2020  . Middle cerebral artery stenosis, left 10/26/2020  . Acute CVA (cerebrovascular accident) ([place]) 10/24/2020  . left MCA stroke 10/23/2020  . Meningioma ([place]) 10/23/2020  . Cerebral edema ([place]) 10/23/2020  . Global aphasia 10/23/2020  . Hypertensive urgency 10/23/2020    REFERRING DIAG: ***  THERAPY DIAG:  Muscle weakness (generalized)  Other abnormalities of gait  and mobility  SUBJECTIVE: ***  PAIN:  Are you having pain? {yes/no:20286} VAS scale: ***/10 Pain location: *** Pain orientation: {Pain Orientation:25161}  PAIN TYPE: {type:313116} Pain description: {PAIN DESCRIPTION:21022940}  Aggravating factors: *** Relieving factors: ***    OBJECTIVE:   TODAY'S TREATMENT: ***  (Copy Patient Education to Plan section here)   Kerrie Pleasure 12/07/2020, 11:05 AM    [facility] [address] [place], [place], [address] Phone: [phone]   Fax:  [phone]  Patient name: Brittany Griffith MRN: [id number] DOB: May 06, 1951

## 2020-12-07 NOTE — Therapy (Signed)
[facility] [address] [place], [place], [address] Phone: [phone]   Fax:  [phone]  Physical Therapy Treatment  Patient Details  Name: Brittany Griffith MRN: [id number] Date of Birth: 08-Jul-1951 Referring Provider (PT): Lauraine Rinne, [place]   Encounter Date: 12/07/2020   PT End of Session - 12/07/20 1104    Visit Number 2    Number of Visits 21    Date for PT Re-Evaluation 02/07/21    Authorization Type [employer]; $30 copay per day for all disciplines, Eval /19/22,    Progress Note Due on Visit 10    PT Start Time 1100    PT Stop Time 1145    PT Time Calculation (min) 45 min    Equipment Utilized During Treatment Gait belt    Activity Tolerance Patient tolerated treatment well    Behavior During Therapy [facility] for tasks assessed/performed           Past Medical History:  Diagnosis Date  . Hypercholesteremia   . Hypertension   . Osteopenia     Past Surgical History:  Procedure Laterality Date  . IR ANGIO INTRA EXTRACRAN SEL COM CAROTID INNOMINATE UNI R MOD SED  10/26/2020  . IR INTRA CRAN STENT  10/26/2020  . RADIOLOGY WITH ANESTHESIA Left 10/26/2020   Procedure: RADIOLOGY WITH ANESTHESIA LEFT MCA STENT;  Surgeon: Luanne Bras, MD;  Location: [facility];  Service: Radiology;  Laterality: Left;    There were no vitals filed for this visit.   Subjective Assessment - 12/07/20 1110    Subjective No new complaints. They have been walking outside with her. .    Patient is accompained by: Family member   Husband and son.   Limitations Sitting;Lifting;Standing;Walking;House hold activities    How long can you sit comfortably? no issue    How long can you stand comfortably? She can't stand    How long can you walk comfortably? She can walk with family with 25% assistance with waling for 5-10 fet at home    Patient Stated Goals Get stroonger, walk better, improve indepdnence.    Currently in Pain? No/denies     Pain Onset More than a month ago           Gait training: 1 x 210' no AD, CGA Walking fwd and bwd: no AD: 5 x 20 feet, mod A going bwd Sit to stand from 14" box: 10x no HHA Standing box taps: 14" box: 10x R and L, min A with L WB Fwd step ups: 4" box: 10x R and L, min A , pt tends to hit R foot on the step when steeping up with L LE first. Min A for overall balance Standing deadlift: 10lb KB from 4" box: with L UE: 10x Walking with 10lbs in L UE: 1 x 115' Seated hamstring curls: green band: 2 x 10  Rnad L Scifit: manual level 5 for 10' with R glove for R UE                             PT Short Term Goals - 11/29/20 1429      PT SHORT TERM GOAL #1   Title pt will be able to ambulate 300' with quad cane with CGA and without evidence of LOB to improve short distance ambulation.    Baseline 115' quad cane CGA-MinA for occaisonal LOB (11/29/20)    Time 5    Period  Weeks    Status New    Target Date 01/03/21      PT SHORT TERM GOAL #2   Title Pt will be able to go up and down12 steps with use of 1 rail and CGA to improve stair negotiation in community    Baseline not tried (11/29/20)    Time 5    Period Weeks    Status New    Target Date 01/03/21      PT SHORT TERM GOAL #3   Title Pt and family will demo compliance with at least 5 days a week walking program to improve walking endurance and practice gait.    Baseline Household ambulator only (11/29/20)    Time 5    Period Weeks    Status New    Target Date 01/03/21             PT Long Term Goals - 11/29/20 1433      PT LONG TERM GOAL #1   Title Pt will be able to ambulate on non compliant surface for 200 feet with appropriate AD with SBA to improve community negotiation.    Baseline not attempted (eval)    Time 10    Period Weeks    Status New    Target Date 02/07/21      PT LONG TERM GOAL #2   Title Pt will be able to ambulate 1050' with appropriate AD with SBA to improve community  ambulation    Baseline 115' with quad cane CGA to min A (11/29/20)    Time 10    Period Weeks    Status New    Target Date 02/07/21      PT LONG TERM GOAL #3   Title Pt will demo 0.59m/s improvement on 10 meter walk test to improve walking speed and improve community ambulation    Baseline Not assessed on eval (11/29/20)    Time 10    Period Weeks    Status New    Target Date 02/07/21      PT LONG TERM GOAL #4   Title Pt will demo 4 points improvement on BBS to reduce fall risk    Baseline not assessed on eval (11/29/20)    Time 10    Period Weeks    Status New    Target Date 02/07/21                 Plan - 12/07/20 1130    Personal Factors and Comorbidities Age;Behavior Pattern;Past/Current Experience;Time since onset of injury/illness/exacerbation;Transportation    Examination-Activity Limitations Bathing;Bed Mobility;Bend;Caring for Others;Carry;Dressing;Hygiene/Grooming;Lift;Sleep;Squat;Stairs;Stand;Toileting;Transfers    Examination-Participation Restrictions Cleaning;Community Activity;Driving;Laundry;Medication Management;Meal Prep;Personal Finances;Shop;Yard Work    [employer] Evolving/Moderate complexity    Rehab Potential Good    PT Frequency 2x / week    PT Duration Other (comment)   10 weeks   PT Treatment/Interventions ADLs/Self Care Home Management;Moist Heat;Gait training;Stair training;Functional mobility training;Therapeutic activities;Therapeutic exercise;Balance training;Neuromuscular re-education;Cognitive remediation;Patient/family education;Orthotic Fit/Training;Manual techniques;Passive range of motion;Energy conservation;Visual/perceptual remediation/compensation;Joint Manipulations    PT Next Visit Plan Perform 10 meter walk test, BBS, issue HEP    PT Home Exercise Plan Access Code [email or web address]     Consulted and Agree with Plan of Care Patient;Family member/caregiver           Patient will benefit from skilled therapeutic  intervention in order to improve the following deficits and impairments:  Abnormal gait,Decreased activity tolerance,Decreased cognition,Decreased balance,Decreased coordination,Decreased safety awareness,Decreased range of motion,Decreased mobility,Decreased knowledge of use of DME,Decreased knowledge  of precautions,Decreased endurance,Decreased strength,Difficulty walking,Impaired flexibility,Impaired perceived functional ability,Increased fascial restricitons,Impaired sensation,Impaired tone,Impaired UE functional use,Postural dysfunction,Pain  Visit Diagnosis: Muscle weakness (generalized)  Other abnormalities of gait and mobility     Problem List Patient Active Problem List   Diagnosis Date Noted  . Right hemiplegia ([place])   . Urinary retention   . Hyperglycemia   . Essential hypertension   . Dysphagia, post-stroke   . 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 Dysphagia 11/01/2020  . Middle cerebral artery stenosis, left 10/26/2020  . Acute CVA (cerebrovascular accident) ([facility]) 10/24/2020  . left MCA stroke 10/23/2020  . Meningioma ([place]) 10/23/2020  . Cerebral edema ([place]) 10/23/2020  . Global aphasia 10/23/2020  . Hypertensive urgency 10/23/2020    Kerrie Pleasure, PT 12/07/2020, 11:43 AM  [facility] [address] [place], [place], [address] Phone: [phone]   Fax:  [phone]  Name: Brittany Griffith MRN: [id number] Date of Birth: 08-27-1950

## 2020-12-07 NOTE — Therapy (Signed)
[facility] [address] [place], [place], [address] Phone: [phone]   Fax:  [phone]  Speech Language Pathology Treatment  Patient Details  Name: Brittany Griffith MRN: [id number] Date of Birth: May 25, 1951 Referring Provider (SLP): Cathlyn Parsons, PA-C   Encounter Date: 12/07/2020   End of Session - 12/07/20 1003    Visit Number 2    Number of Visits 17    Date for SLP Re-Evaluation 02/27/21    Authorization Type [employer] Medicare    SLP Start Time 1015    SLP Stop Time  1100    SLP Time Calculation (min) 45 min    Activity Tolerance Patient limited by fatigue           Past Medical History:  Diagnosis Date  . Hypercholesteremia   . Hypertension   . Osteopenia     Past Surgical History:  Procedure Laterality Date  . IR ANGIO INTRA EXTRACRAN SEL COM CAROTID INNOMINATE UNI R MOD SED  10/26/2020  . IR INTRA CRAN STENT  10/26/2020  . RADIOLOGY WITH ANESTHESIA Left 10/26/2020   Procedure: RADIOLOGY WITH ANESTHESIA LEFT MCA STENT;  Surgeon: Luanne Bras, MD;  Location: [facility];  Service: Radiology;  Laterality: Left;    There were no vitals filed for this visit.   Subjective Assessment - 12/07/20 1018    Subjective nodded head    Patient is accompained by: Family member   husband   Currently in Pain? No/denies                 ADULT SLP TREATMENT - 12/07/20 1002      General Information   Behavior/Cognition Alert;Pleasant mood;Requires cueing;Doesn't follow directions consistently     Treatment Provided   Treatment provided Cognitive-Linquistic      Cognitive-Linquistic Treatment   Treatment focused on Aphasia;Apraxia;Patient/family/caregiver education    Skilled Treatment SLP completed [place] Based Language Test this session with score of 27/67 achieved. Pt was able to successfully match object to object and object picture for 2-4 items. Pt noted with 58% accuracy for yes/no  questions ranging from simple to complex. Pt scored 2/4 on reading subtest; however, limited reading  comprehension actually suspected. Pt able to draw mouth for picture and complete simple copying with adequate accuracy; however, pt unable to copy name due to perseverations. Pt with attempted verbalizations x3 this session, which were all unintellgibile. SLP trialed basic AAC board of 6 icons (yes, no, eat, drink, sick, bathroom). SLP reduced number to 4 icons due to comprehension. Pt averaged 75% accurate for ID of 4 icons given verbal prompt and visual depiction. Pt's husband translated into [place], in which pt was 50% accurate using AAC board. SLP modified board for only 3 icons for trial at home. SLP provided AAC board and yes/no board to continue to practice. Pt's husband reports she is pointing and attempting verbalizations; however, family unable to decipher as pt is not distinct with pointing seemingly secondary to limb apraxia.      Assessment / Recommendations / Plan   Plan Continue with current plan of care      Progression Toward Goals   Progression toward goals Progressing toward goals            SLP Education - 12/07/20 1331    Education Details aac boards, additional processing time, modeling    Person(s) Educated Patient;Spouse    Methods Explanation;Demonstration;Handout;Tactile cues;Verbal cues    Comprehension Verbalized understanding;Returned demonstration;Need further instruction;Tactile cues required  SLP Short Term Goals - 12/07/20 1003      SLP SHORT TERM GOAL #1   Title Pt will use mulitmodal communication (gesture, draw, write 1st letter etc) to augment verbal expression of basic wants/needs with usual mod A for 3 sessions    Time 4    Period Weeks    Status On-going      SLP SHORT TERM GOAL #2   Title Pt's caregivers will appropriately cue patient and use alternative communication means when needed with occasional min A from SLP over 2 sessions     Time 4    Period Weeks    Status On-going      SLP SHORT TERM GOAL #3   Title Pt will approximate one-word personally relevant responses to supplement alternatives means of communication with usual mod A over 2 sessions    Time 4    Period Weeks    Status On-going      SLP SHORT TERM GOAL #4   Title Pt will ID object in field of 4 to communicate wants/needs or demonstrate understanding with 75% accuracy given occasional min A over 3 sessions    Time 4    Period Weeks    Status On-going      SLP SHORT TERM GOAL #5   Title Pt will tolerate trials of dysphagia 3/mechanical soft consistency with no overt s/sx of aspiration given occasional min A over 3 sessions    Time 4    Period Weeks    Status On-going            SLP Long Term Goals - 12/07/20 1003      SLP LONG TERM GOAL #1   Title Pt will use mulitmodal communication (gesture, draw, write 1st letter etc) to augment verbal expression to meet needs at home with occasional mod A from family.    Time 8    Period Weeks    Status On-going      SLP LONG TERM GOAL #2   Title Pt will correctly ID item in field of 6 to communicate wants/needs with 75% accuracy given occasional min A over 2 sessions    Time 8    Period Weeks    Status On-going      SLP LONG TERM GOAL #3   Title Pt will approximate 3 personally relevant one word responses to supplement alternatives means of communication with usual mod A over 2 sessions    Time 8    Period Weeks    Status On-going      SLP LONG TERM GOAL #4   Title Caregivers will report improvements in communication effectiveness via QOL scale by last ST session    Time 8    Period Weeks    Status On-going      SLP LONG TERM GOAL #5   Title Pt will tolerate safest and least restricitive diet with no overt s/sx of aspiration reported or exhibited over 3 sessions    Time 8    Period Weeks    Status On-going            Plan - 12/07/20 1332    Clinical Impression Statement Javonna was  referred for OPST intervention to address severe verbal apraxia, severe expressive/mod receptive aphasia, and 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 SLP completed language evaluation this session, which revealed some possible strengths related to perceptual and comprehension skills. SLP trialed basic needs AAC  board with modification from 6 to 3 icons as well as yes/no board. Further education and training required to inconsistent performance exhibited today. Due to severity of apraxia, aphasia, and persistent dysphagia secondary to oral motor apraxia, SLP recommends skilled ST intervention to improve efficiency and accuracy of communication of basic wants/needs and to improve swallow function to reduce risk of aspiration and optimize QOL.    Speech Therapy Frequency 2x / week    Duration 8 weeks   or 17 total visits   Treatment/Interventions Aspiration precaution training;Trials of upgraded texture/liquids;Oral motor exercises;Compensatory strategies;Pharyngeal strengthening exercises;Cueing hierarchy;Functional tasks;Patient/family education;Diet toleration management by SLP;Environmental controls;Cognitive reorganization;Multimodal communcation approach;Language facilitation;Compensatory techniques;Internal/external aids;SLP instruction and feedback    Potential to Achieve Goals Fair    Potential Considerations Severity of impairments;Co-morbidities;Ability to learn/carryover information    SLP Home Exercise Plan provided    Consulted and Agree with Plan of Care Patient;Family member/caregiver    Family Member Consulted husband           Patient will benefit from skilled therapeutic intervention in order to improve the following deficits and impairments:   Verbal apraxia  Aphasia  Cognitive communication deficit    Problem List Patient Active Problem List   Diagnosis Date Noted  . Right hemiplegia ([place])   .  Urinary retention   . Hyperglycemia   . Essential hypertension   . Dysphagia, post-stroke   . 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 Dysphagia 11/01/2020  . Middle cerebral artery stenosis, left 10/26/2020  . Acute CVA (cerebrovascular accident) ([facility]) 10/24/2020  . left MCA stroke 10/23/2020  . Meningioma ([facility]) 10/23/2020  . Cerebral edema ([place]) 10/23/2020  . Global aphasia 10/23/2020  . Hypertensive urgency 10/23/2020    Alinda Deem, MA CCC-SLP 12/07/2020, 1:36 PM  [facility] [address] [place], [place], [address] Phone: [phone]   Fax:  [phone]   Name: Lucianna Ostlund MRN: [id number] Date of Birth: 1951/07/27

## 2020-12-07 NOTE — Therapy (Signed)
[facility] [address] [place], [place], [address] Phone: [phone]   Fax:  [phone]  Occupational Therapy Evaluation  Patient Details  Name: Brittany Griffith MRN: [id number] Date of Birth: 08-22-1950 No data recorded  Encounter Date: 12/07/2020   OT End of Session - 12/07/20 1100    Visit Number 1    Number of Visits 25    Date for OT Re-Evaluation 03/06/21    Authorization Type [employer] MCR    Authorization - Visit Number 1    Authorization - Number of Visits 10    Progress Note Due on Visit 10    OT Start Time 0930    OT Stop Time 1015    OT Time Calculation (min) 45 min    Activity Tolerance Patient tolerated treatment well    Behavior During Therapy [facility] for tasks assessed/performed           Past Medical History:  Diagnosis Date  . Hypercholesteremia   . Hypertension   . Osteopenia     Past Surgical History:  Procedure Laterality Date  . IR ANGIO INTRA EXTRACRAN SEL COM CAROTID INNOMINATE UNI R MOD SED  10/26/2020  . IR INTRA CRAN STENT  10/26/2020  . RADIOLOGY WITH ANESTHESIA Left 10/26/2020   Procedure: RADIOLOGY WITH ANESTHESIA LEFT MCA STENT;  Surgeon: Luanne Bras, MD;  Location: [facility];  Service: Radiology;  Laterality: Left;    There were no vitals filed for this visit.   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 PMH: HTN, HLD, Meningioma    Limitations fall risk, aphasic,    Currently in Pain? Yes   Rt arm and hand - unable to rate d/t aphasia   Pain Onset More than a month ago             [facility] OT Assessment - 12/07/20 0001      Assessment   Medical Diagnosis Lt CVA   Rt dominant hemiparesis   Onset Date/Surgical Date 10/23/20    Hand Dominance Right    Prior Therapy IR      Precautions   Precautions Fall    Precaution Comments aphasic,  dysarthria      Balance Screen   Has the patient fallen in the past 6 months Yes    How many times? 1 in hospital      Home  Environment   Bathroom Shower/Tub Walk-in Shower   grab bars, shower seat   Additional Comments Pt lives w/ husband in 1 story home, 2 steps to enter. DME: BSC, shower seat, w/c, walker, cane    Lives With Spouse      Prior Function   Level of Independence Independent    Vocation Retired   working part time as Scientist, water quality at [employer]      ADL   Eating/Feeding Needs assist with cutting food   uses Lt hand to eat   Grooming Moderate assistance   using Lt hand   Upper Body Bathing Moderate assistance    Lower Body Bathing Moderate assistance    Upper Body Dressing Maximal assistance    Lower Body Dressing Maximal assistance    Toilet Transfer Minimal assistance    Toileting - Clothing Manipulation Moderate assistance    Toileting -  Hygiene Moderate assistance   for bowel movement  Tub/Shower Transfer Minimal assistance    ADL comments dependent for IADLS      Mobility   Mobility Status Comments Mainly w/c, using quad cane short distances w/ assist      Written Expression   Dominant Hand Right    Handwriting --   unable     Vision - History   Baseline Vision Wears contact    Additional Comments Rt inattention since stroke, unable to formally assess vision d/t aphasia      Cognition   Cognition Comments see speech note for details - pt w/ expressive aphasia and some mild receptive aphasia. Fairly consistent w/ yes/no but difficulty following 1 step commands      Observation/Other Assessments   Observations aphasic, Rt dominant side hemiplegia, 1 finger width subluxation Rt shoulder, supportive family      Sensation   Light Touch Impaired by gross assessment    Additional Comments not consistent but also difficult to assess d/t aphasia      Coordination   Coordination No functional use RT hand      Perception   Perception  Impaired    Inattention/Neglect --   improving but decreased attn to Rt side     Edema   Edema moderate Rt hand      Tone   Assessment Location Right Upper Extremity      ROM / Strength   AROM / PROM / Strength AROM;PROM      AROM   Overall AROM Comments Pt w/ very limited active movement - 50% scapula elevation andretraction, 10% finger flexion, minimal elbow flexion. Pt has min activation RUE during low range closed chain activity      PROM   Overall PROM Comments Pt can tolerate passive sh flexion to slightly above midrange while seated (approx 100*) and 75% in supine. Pt WFL's elbow distally      RUE Tone   RUE Tone Hypotonic      RUE Tone   Hypotonic Details Pt w/ beginning activation noted RUE in low closed chain however hypotonic entire RUE. Anticipate spasticity at certain joints within first 3 months                           OT Education - 12/07/20 1058    Education Details OT POC    Person(s) Educated Patient;Spouse;Child(ren)    Methods Explanation    Comprehension Verbalized understanding            OT Short Term Goals - 12/07/20 1109      OT SHORT TERM GOAL #1   Title Pt/family Independent with initial HEP    Time 4    Period Weeks    Status New      OT SHORT TERM GOAL #2   Title Pt/family independent with splint wear and care prn    Time 4    Period Weeks    Status New      OT SHORT TERM GOAL #3   Title Pt to cut food w/ AE prn    Time 4    Period Weeks    Status New      OT SHORT TERM GOAL #4   Title Pt/family to id A/E and one handed techniques to assist with BADLs (dressing, bathing, grooming, toileting)    Time 4    Period Weeks    Status New      OT SHORT TERM GOAL #5   Title Pt to  demo 25* Rt shoulder flexion in prep for low level reaching    Time 4    Period Weeks    Status New      Additional Short Term Goals   Additional Short Term Goals Yes      OT SHORT TERM GOAL #6   Title Pt to demo 25% finger flexion  and 10% finger extension in prep for grasping/releasing Rt hand    Time 4    Period Weeks    Status New             OT Long Term Goals - 12/07/20 1112      OT LONG TERM GOAL #1   Title Pt to be independent with updated HEP    Time 12    Period Weeks    Status New      OT LONG TERM GOAL #2   Title Pt to be mod I with all BADLS using A/E prn (grooming, dressing, bathing, and toileting)    Time 12    Period Weeks    Status New      OT LONG TERM GOAL #3   Title Pt to prepare simple snack, sandwich, and/or microwaveable items mod I level    Time 12    Period Weeks    Status New      OT LONG TERM GOAL #4   Title Pt to perform RUE reaching in low ranges to grasp and release 1-2" objects consistently    Time 12    Period Weeks    Status New      OT LONG TERM GOAL #5   Title Pt to use RUE as min assist for BADLS and bilateral tasks    Time 12    Period Weeks    Status New      Long Term Additional Goals   Additional Long Term Goals Yes      OT LONG TERM GOAL #6   Title Pt to improve RUE function as evidenced by performing 6 blocks on Box & Blocks test    Baseline unable, no functional movement    Time 12    Period Weeks    Status New                 Plan - 12/07/20 1101    Clinical Impression Statement Pt is a 70 y.o. female who presents to [facility] s/p Lt MCA CVA beginning on 10/23/20 with extension branching into and including temporal, parietal and dorsal part of frontal lobe. Pt presents today with dense Rt dominant side hemiplegia, aphasia, decreased sensation, balance, mild edema and overall significant decrease in function and would benefit from O.T. to address these deficits, improve safety and independence with ADLS, decrease caregiver burden of care, and improve RUE arm function.    OT Occupational Profile and History Detailed Assessment- Review of Records and additional review of physical, cognitive, psychosocial history related to current functional  performance    Occupational performance deficits (Please refer to evaluation for details): ADL's;IADL's;Social Participation;Work;Leisure    Body Structure / Function / Physical Skills ADL;Strength;Pain;Body mechanics;Edema;Proprioception;UE functional use;IADL;ROM;Coordination;Mobility;Sensation;FMC    Cognitive Skills Attention;Understand;Perception    Rehab Potential Good    Clinical Decision Making Several treatment options, min-mod task modification necessary    Comorbidities Affecting Occupational Performance: May have comorbidities impacting occupational performance    Modification or Assistance to Complete Evaluation  Min-Moderate modification of tasks or assist with assess necessary to complete eval    OT Frequency 2x /  week    OT Duration 12 weeks   plus eval   OT Treatment/Interventions Self-care/ADL training;Moist Heat;DME and/or AE instruction;Splinting;Therapeutic activities;Therapeutic exercise;Cognitive remediation/compensation;Coping strategies training;Neuromuscular education;Functional Mobility Training;Passive range of motion;Visual/perceptual remediation/compensation;Patient/family education;Manual Therapy;Electrical Stimulation;Energy conservation    Plan initiate HEP (caregiver and self ROM)    Consulted and Agree with Plan of Care Patient;Family member/caregiver    Family Member Consulted Husband and son           Patient will benefit from skilled therapeutic intervention in order to improve the following deficits and impairments:   Body Structure / Function / Physical Skills: ADL,Strength,Pain,Body mechanics,Edema,Proprioception,UE functional use,IADL,ROM,Coordination,Mobility,Sensation,FMC Cognitive Skills: Attention,Understand,Perception     Visit Diagnosis: Hemiplegia and hemiparesis following cerebral infarction affecting right dominant side (HCC)  Other disturbances of skin sensation  Other symptoms and signs involving cognitive functions following cerebral  infarction  Localized edema  Unsteadiness on feet    Problem List Patient Active Problem List   Diagnosis Date Noted  . Right hemiplegia ([place])   . Urinary retention   . Hyperglycemia   . Essential hypertension   . Dysphagia, post-stroke   . 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 Dysphagia 11/01/2020  . Middle cerebral artery stenosis, left 10/26/2020  . Acute CVA (cerebrovascular accident) ([place]) 10/24/2020  . left MCA stroke 10/23/2020  . Meningioma ([place]) 10/23/2020  . Cerebral edema ([place]) 10/23/2020  . Global aphasia 10/23/2020  . Hypertensive urgency 10/23/2020    Carey Bullocks, OTR/L 12/07/2020, 11:18 AM  [facility] [address] [place], [place], [address] Phone: [phone]   Fax:  [phone]  Name: Anye Brose MRN: [id number] Date of Birth: 1950-12-15

## 2020-12-08 ENCOUNTER — Ambulatory Visit (HOSPITAL_COMMUNITY)
Admission: RE | Admit: 2020-12-08 | Discharge: 2020-12-08 | Disposition: A | Discharge status: Home / Self Care | Payer: Medicare HMO | Source: Ambulatory Visit | Attending: Student | Admitting: Student

## 2020-12-08 DIAGNOSIS — I6602 Occlusion and stenosis of left middle cerebral artery: Secondary | ICD-10-CM

## 2020-12-09 ENCOUNTER — Ambulatory Visit: Payer: Medicare HMO

## 2020-12-09 ENCOUNTER — Ambulatory Visit: Payer: Medicare HMO | Admitting: Occupational Therapy

## 2020-12-09 HISTORY — PX: IR RADIOLOGIST EVAL & MGMT: IMG5224

## 2020-12-12 ENCOUNTER — Emergency Department (HOSPITAL_BASED_OUTPATIENT_CLINIC_OR_DEPARTMENT_OTHER): Payer: Medicare HMO

## 2020-12-12 ENCOUNTER — Other Ambulatory Visit: Payer: Self-pay

## 2020-12-12 ENCOUNTER — Encounter (HOSPITAL_BASED_OUTPATIENT_CLINIC_OR_DEPARTMENT_OTHER): Payer: Self-pay

## 2020-12-12 ENCOUNTER — Emergency Department (HOSPITAL_BASED_OUTPATIENT_CLINIC_OR_DEPARTMENT_OTHER)
Admission: EM | Admit: 2020-12-12 | Discharge: 2020-12-12 | Disposition: A | Discharge status: Home / Self Care | Payer: Medicare HMO | Attending: Emergency Medicine | Admitting: Emergency Medicine

## 2020-12-12 ENCOUNTER — Emergency Department (HOSPITAL_BASED_OUTPATIENT_CLINIC_OR_DEPARTMENT_OTHER): Payer: Medicare HMO | Admitting: Radiology

## 2020-12-12 DIAGNOSIS — Z7982 Long term (current) use of aspirin: Secondary | ICD-10-CM | POA: Insufficient documentation

## 2020-12-12 DIAGNOSIS — I10 Essential (primary) hypertension: Secondary | ICD-10-CM | POA: Insufficient documentation

## 2020-12-12 DIAGNOSIS — J690 Pneumonitis due to inhalation of food and vomit: Secondary | ICD-10-CM

## 2020-12-12 DIAGNOSIS — Z79899 Other long term (current) drug therapy: Secondary | ICD-10-CM | POA: Diagnosis not present

## 2020-12-12 DIAGNOSIS — J181 Lobar pneumonia, unspecified organism: Secondary | ICD-10-CM | POA: Insufficient documentation

## 2020-12-12 DIAGNOSIS — R059 Cough, unspecified: Secondary | ICD-10-CM | POA: Diagnosis present

## 2020-12-12 HISTORY — DX: Cerebral infarction, unspecified: I63.9

## 2020-12-12 LAB — CBC WITH DIFFERENTIAL/PLATELET
Abs Immature Granulocytes: 0.04 10*3/uL (ref 0.00–0.07)
Basophils Absolute: 0 10*3/uL (ref 0.0–0.1)
Basophils Relative: 1 %
Eosinophils Absolute: 0.2 10*3/uL (ref 0.0–0.5)
Eosinophils Relative: 3 %
HCT: 34.8 % — ABNORMAL LOW (ref 36.0–46.0)
Hemoglobin: 11.9 g/dL — ABNORMAL LOW (ref 12.0–15.0)
Immature Granulocytes: 1 %
Lymphocytes Relative: 26 %
Lymphs Abs: 2.1 10*3/uL (ref 0.7–4.0)
MCH: 29.1 pg (ref 26.0–34.0)
MCHC: 34.2 g/dL (ref 30.0–36.0)
MCV: 85.1 fL (ref 80.0–100.0)
Monocytes Absolute: 0.5 10*3/uL (ref 0.1–1.0)
Monocytes Relative: 7 %
Neutro Abs: 5.1 10*3/uL (ref 1.7–7.7)
Neutrophils Relative %: 62 %
Platelets: 266 10*3/uL (ref 150–400)
RBC: 4.09 MIL/uL (ref 3.87–5.11)
RDW: 13.2 % (ref 11.5–15.5)
WBC: 8 10*3/uL (ref 4.0–10.5)
nRBC: 0 % (ref 0.0–0.2)

## 2020-12-12 LAB — BASIC METABOLIC PANEL
Anion gap: 11 (ref 5–15)
BUN: 17 mg/dL (ref 8–23)
CO2: 21 mmol/L — ABNORMAL LOW (ref 22–32)
Calcium: 9.7 mg/dL (ref 8.9–10.3)
Chloride: 108 mmol/L (ref 98–111)
Creatinine, Ser: 0.72 mg/dL (ref 0.44–1.00)
GFR, Estimated: >60 mL/min (ref >60)
Glucose, Bld: 123 mg/dL — ABNORMAL HIGH (ref 70–99)
Potassium: 3.7 mmol/L (ref 3.5–5.1)
Sodium: 140 mmol/L (ref 135–145)

## 2020-12-12 MED ORDER — CLINDAMYCIN HCL 150 MG PO CAPS
300.0000 mg | ORAL_CAPSULE | Freq: Once | ORAL | Status: AC
  Administered 2020-12-12: 300 mg via ORAL
  Filled 2020-12-12: qty 2

## 2020-12-12 MED ORDER — ALBUTEROL SULFATE HFA 108 (90 BASE) MCG/ACT IN AERS
2.0000 | INHALATION_SPRAY | Freq: Once | RESPIRATORY_TRACT | Status: AC
  Administered 2020-12-12: 2 via RESPIRATORY_TRACT

## 2020-12-12 MED ORDER — AEROCHAMBER PLUS FLO-VU LARGE MISC
1.0000 | Freq: Once | Status: AC
  Administered 2020-12-12: 1
  Filled 2020-12-12: qty 1

## 2020-12-12 MED ORDER — CLINDAMYCIN HCL 300 MG PO CAPS: 300.0000 mg | ORAL_CAPSULE | Freq: Three times a day (TID) | ORAL | 0 refills | Status: AC

## 2020-12-12 MED ORDER — GUAIFENESIN 100 MG/5ML PO SYRP: 100.0000 mg | ORAL_SOLUTION | ORAL | 0 refills | Status: DC | PRN

## 2020-12-12 NOTE — ED Triage Notes (Signed)
Patient BIB [facility] for Cough.  Patient had ingested some of her medications PTA and began having a cough shortly after. Per Family, the patient did have a very small amount of blood with cough but is mostly saliva.   Patient has recent Hx of Stroke and is unable to communicate due to residual deficits.

## 2020-12-12 NOTE — ED Notes (Signed)
EDP [place] at Bedside.

## 2020-12-12 NOTE — ED Provider Notes (Addendum)
[facility] EMERGENCY DEPT Provider Note   CSN: [id number] Arrival date & time: 12/12/20  2055     History Chief Complaint  Patient presents with  . Cough    Brittany Griffith is a 70 y.o. female.  Pt presents to the ED today with a cough.  She had a stroke in March and is no longer able to speak.  Her son tells me that she's had a little cough today, but it was worse after she ate dinner and took her meds.  Pt ate chopped up curry and vegetables tonight.  Pt is at risk for aspiration and her diet has recently been advanced from a dysphagia #2 to 3.  Pt has not had a fever.  She has been able to swallow water since she had this episode.          Past Medical History:  Diagnosis Date  . Hypercholesteremia   . Hypertension   . Osteopenia   . Stroke [facility])     Patient Active Problem List   Diagnosis Date Noted  . Right hemiplegia ([place])   . Urinary retention   . Hyperglycemia   . Essential hypertension   . Dysphagia, post-stroke   . 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 Left middle cerebral artery stroke ([place]) 11/01/2020  . Dysphagia 11/01/2020  . Middle cerebral artery stenosis, left 10/26/2020  . Acute CVA (cerebrovascular accident) ([place]) 10/24/2020  . left MCA stroke 10/23/2020  . Meningioma ([place]) 10/23/2020  . Cerebral edema ([place]) 10/23/2020  . Global aphasia 10/23/2020  . Hypertensive urgency 10/23/2020    Past Surgical History:  Procedure Laterality Date  . IR ANGIO INTRA EXTRACRAN SEL COM CAROTID INNOMINATE UNI R MOD SED  10/26/2020  . IR INTRA CRAN STENT  10/26/2020  . IR RADIOLOGIST EVAL & MGMT  12/09/2020  . RADIOLOGY WITH ANESTHESIA Left 10/26/2020   Procedure: RADIOLOGY WITH ANESTHESIA LEFT MCA STENT;  Surgeon: Luanne Bras, MD;  Location: [facility];  Service: Radiology;  Laterality: Left;     OB History   No obstetric history on file.     Family History  Problem Relation Age of Onset  . Stroke  Father   . Heart attack Father   . Kidney disease Brother     Social History   Tobacco Use  . Smoking status: Never Smoker  . Smokeless tobacco: Never Used  Substance Use Topics  . Alcohol use: Not Currently  . Drug use: Not Currently    Home Medications Prior to Admission medications   Medication Sig Start Date End Date Taking? Authorizing Provider  clindamycin (CLEOCIN) 300 MG capsule Take 1 capsule (300 mg total) by mouth 3 (three) times daily. 12/12/20  Yes Isla Pence, MD  guaifenesin (ROBITUSSIN) 100 MG/5ML syrup Take 5-10 mLs (100-200 mg total) by mouth every 4 (four) hours as needed for cough. 12/12/20  Yes Isla Pence, MD  acetaminophen (TYLENOL) 325 MG tablet Take 2 tablets (650 mg total) by mouth every 4 (four) hours as needed for mild pain (or temp > 37.5 C (99.5 F)). 11/21/20   Angiulli, Lavon Paganini, PA-C  amLODipine (NORVASC) 5 MG tablet Take 1 tablet (5 mg total) by mouth daily. 11/22/20   Angiulli, Lavon Paganini, PA-C  aspirin 81 MG chewable tablet Chew 1 tablet (81 mg total) by mouth daily. 11/01/20   Olivencia-Simmons, Prince Rome, NP  atorvastatin (LIPITOR) 80 MG tablet Take 1 tablet (80 mg total) by mouth daily. 11/21/20   Lauraine Rinne  J, PA-C  levETIRAcetam (KEPPRA) 100 MG/ML solution Take 5 mLs (500 mg total) by mouth 2 (two) times daily. 11/21/20   Angiulli, Lavon Paganini, PA-C  losartan (COZAAR) 50 MG tablet Take 0.5 tablets (25 mg total) by mouth 2 (two) times daily. 11/21/20   Angiulli, Lavon Paganini, PA-C  Multiple Vitamins-Minerals (MULTIVITAMIN WITH MINERALS) tablet Take 1 tablet by mouth daily.    [provider]  polyethylene glycol (MIRALAX / GLYCOLAX) 17 g packet Place 17 g into feeding tube 2 (two) times daily. 11/21/20   Angiulli, Lavon Paganini, PA-C  potassium chloride SA (KLOR-CON) 20 MEQ tablet Take 1 tablet (20 mEq total) by mouth daily. 11/22/20   Angiulli, Lavon Paganini, PA-C  ticagrelor (BRILINTA) 90 MG TABS tablet Take 1 tablet (90 mg total) by mouth 2 (two) times  daily. 11/21/20   Angiulli, Lavon Paganini, PA-C    Allergies    Penicillins  Review of Systems   Review of Systems  Respiratory: Positive for cough.   All other systems reviewed and are negative.   Physical Exam Updated Vital Signs BP (!) 142/62 (BP Location: Right Arm)   Pulse 81   Temp 98.6 F (37 C) (Oral)   Resp 16   Ht 5\' 1"  (1.549 m)   Wt 56.7 kg   SpO2 98%   BMI 23.62 kg/m   Physical Exam Vitals and nursing note reviewed.  Constitutional:      Appearance: Normal appearance.  HENT:     Head: Normocephalic and atraumatic.     Right Ear: External ear normal.     Left Ear: External ear normal.     Nose: Nose normal.     Mouth/Throat:     Mouth: Mucous membranes are moist.     Pharynx: Oropharynx is clear.  Eyes:     Extraocular Movements: Extraocular movements intact.     Conjunctiva/sclera: Conjunctivae normal.     Pupils: Pupils are equal, round, and reactive to light.  Cardiovascular:     Rate and Rhythm: Normal rate and regular rhythm.     Pulses: Normal pulses.     Heart sounds: Normal heart sounds.  Pulmonary:     Effort: Pulmonary effort is normal.     Breath sounds: Normal breath sounds.  Abdominal:     General: Abdomen is flat. Bowel sounds are normal.     Palpations: Abdomen is soft.  Musculoskeletal:        General: Normal range of motion.     Cervical back: Normal range of motion and neck supple.  Skin:    General: Skin is warm.     Capillary Refill: Capillary refill takes less than 2 seconds.  Neurological:     Mental Status: She is alert. Mental status is at baseline.     Comments: RUE paralysis Expressive aphasia     ED Results / Procedures / Treatments   Labs (all labs ordered are listed, but only abnormal results are displayed) Labs Reviewed  BASIC METABOLIC PANEL - Abnormal; Notable for the following components:      Result Value   CO2 21 (*)    Glucose, Bld 123 (*)    All other components within normal limits  CBC WITH  DIFFERENTIAL/PLATELET - Abnormal; Notable for the following components:   Hemoglobin 11.9 (*)    HCT 34.8 (*)    All other components within normal limits    EKG None  Radiology DG Chest 2 View  Result Date: 12/12/2020 CLINICAL DATA:  Cough EXAM:  CHEST - 2 VIEW COMPARISON:  None. FINDINGS: Normal heart size. Normal mediastinal contour. No pneumothorax. No pleural effusion. No pulmonary edema. Faint hazy opacities in the lower lungs bilaterally with slightly low lung volumes. No consolidative airspace disease. IMPRESSION: Faint hazy opacities in the lower lungs bilaterally with slightly low lung volumes, favor atelectasis. Electronically Signed   By: Ilona Sorrel M.D.   On: 12/12/2020 21:47   CT Chest Wo Contrast  Result Date: 12/12/2020 CLINICAL DATA:  Cough after ingestion of medication. EXAM: CT CHEST WITHOUT CONTRAST TECHNIQUE: Multidetector CT imaging of the chest was performed following the standard protocol without IV contrast. COMPARISON:  None. FINDINGS: Cardiovascular: There is moderate severity calcification of the aortic arch. The ascending thoracic aorta measures approximately 4.0 cm x 3.8 cm. Normal heart size with mild coronary artery calcification. No pericardial effusion. Mediastinum/Nodes: Small, partially calcified pretracheal and AP window lymph nodes are seen. The right and left lobes of the thyroid gland are lobulated in appearance. A 4 mm round hyperdense focus is seen within the lower pole of the right lobe of the thyroid gland (axial CT image 17, CT series number 2). The trachea and esophagus demonstrate no significant findings. Lungs/Pleura: Mild areas of linear scarring and/or atelectasis are seen within the bilateral apices and posterior aspect of the left upper lobe. Very mild posterior right upper lobe and posterior bibasilar atelectasis and/or infiltrate is noted. There is no evidence of a pleural effusion or pneumothorax. Upper Abdomen: No acute abnormality.  Musculoskeletal: No chest wall mass or suspicious bone lesions identified. IMPRESSION: 1. Mild biapical and posterior left upper lobe linear scarring and/or atelectasis. 2. Very mild posterior right upper lobe and bibasilar atelectasis and/or early infiltrate. Electronically Signed   By: Virgina Norfolk M.D.   On: 12/12/2020 22:27    Procedures Procedures   Medications Ordered in ED Medications  clindamycin (CLEOCIN) capsule 300 mg (has no administration in time range)  albuterol (VENTOLIN HFA) 108 (90 Base) MCG/ACT inhaler 2 puff (2 puffs Inhalation Given 12/12/20 2130)  AeroChamber Plus Flo-Vu Large MISC 1 each (1 each Other Given 12/12/20 2131)    ED Course  I have reviewed the triage vital signs and the nursing notes.  Pertinent labs & imaging results that were available during my care of the patient were reviewed by me and considered in my medical decision making (see chart for details).    MDM Rules/Calculators/A&P                          I suspect pt has aspirated.  She is not hypoxic and does not look in distress.  She is saturating well.  I will put her on outpatient abx.  Pt is allergic to pcn, so she is started on clindamycin.  She is told to go back to dysphagia #2 diet.  Family is given a print out.  Pt has a speech therapy appt on Wed (5/4), so she can have further eval by the speech therapist.  Return if worse. Final Clinical Impression(s) / ED Diagnoses Final diagnoses:  Aspiration pneumonia of right upper lobe, unspecified aspiration pneumonia type ([place])    Rx / DC Orders ED Discharge Orders         Ordered    clindamycin (CLEOCIN) 300 MG capsule  3 times daily        12/12/20 2238    guaifenesin (ROBITUSSIN) 100 MG/5ML syrup  Every 4 hours PRN  12/12/20 2239           Isla Pence, MD 12/12/20 2240    Isla Pence, MD 12/12/20 2241

## 2020-12-12 NOTE — Discharge Instructions (Signed)
Go back to a Dysphagia Type 2 diet.

## 2020-12-14 ENCOUNTER — Other Ambulatory Visit: Payer: Self-pay

## 2020-12-14 ENCOUNTER — Ambulatory Visit: Payer: Medicare HMO

## 2020-12-14 ENCOUNTER — Ambulatory Visit: Payer: Medicare HMO | Admitting: Occupational Therapy

## 2020-12-14 ENCOUNTER — Ambulatory Visit: Payer: Medicare HMO | Attending: Physician Assistant

## 2020-12-14 DIAGNOSIS — R208 Other disturbances of skin sensation: Secondary | ICD-10-CM | POA: Insufficient documentation

## 2020-12-14 DIAGNOSIS — I69351 Hemiplegia and hemiparesis following cerebral infarction affecting right dominant side: Secondary | ICD-10-CM | POA: Insufficient documentation

## 2020-12-14 DIAGNOSIS — R482 Apraxia: Secondary | ICD-10-CM

## 2020-12-14 DIAGNOSIS — R4701 Aphasia: Secondary | ICD-10-CM | POA: Insufficient documentation

## 2020-12-14 DIAGNOSIS — M6281 Muscle weakness (generalized): Secondary | ICD-10-CM

## 2020-12-14 DIAGNOSIS — R2689 Other abnormalities of gait and mobility: Secondary | ICD-10-CM | POA: Diagnosis present

## 2020-12-14 DIAGNOSIS — R41841 Cognitive communication deficit: Secondary | ICD-10-CM | POA: Insufficient documentation

## 2020-12-14 DIAGNOSIS — I69318 Other symptoms and signs involving cognitive functions following cerebral infarction: Secondary | ICD-10-CM | POA: Insufficient documentation

## 2020-12-14 DIAGNOSIS — R1312 Dysphagia, oropharyngeal phase: Secondary | ICD-10-CM | POA: Insufficient documentation

## 2020-12-14 DIAGNOSIS — R2681 Unsteadiness on feet: Secondary | ICD-10-CM | POA: Diagnosis present

## 2020-12-14 NOTE — Patient Instructions (Signed)
Signs of Aspiration Pneumonia   . Chest pain/tightness . Fever (can be low grade) . Cough  o With foul-smelling phlegm (sputum) o With sputum containing pus or blood o With greenish sputum . Fatigue  . Shortness of breath  . Wheezing   **IF YOU HAVE THESE SIGNS, CONTACT YOUR DOCTOR OR GO TO THE EMERGENCY DEPARTMENT OR URGENT CARE AS SOON AS POSSIBLE**      

## 2020-12-14 NOTE — Therapy (Signed)
[facility] [address] [place], [place], [address] Phone: [phone]   Fax:  [phone]  Speech Language Pathology Treatment  Patient Details  Name: Brittany Griffith MRN: [id number] Date of Birth: [birth year] Referring Provider (SLP): Cathlyn Parsons, PA-C   Encounter Date: 12/14/2020   End of Session - 12/14/20 1221    Visit Number 3    Number of Visits 17    Date for SLP Re-Evaluation 02/27/21    Authorization Type [employer] Medicare    SLP Start Time 7253    SLP Stop Time  1313    SLP Time Calculation (min) 43 min    Activity Tolerance Patient limited by lethargy;Patient limited by fatigue           Past Medical History:  Diagnosis Date  . Hypercholesteremia   . Hypertension   . Osteopenia   . Stroke [facility])     Past Surgical History:  Procedure Laterality Date  . IR ANGIO INTRA EXTRACRAN SEL COM CAROTID INNOMINATE UNI R MOD SED  10/26/2020  . IR INTRA CRAN STENT  10/26/2020  . IR RADIOLOGIST EVAL & MGMT  12/09/2020  . RADIOLOGY WITH ANESTHESIA Left 10/26/2020   Procedure: RADIOLOGY WITH ANESTHESIA LEFT MCA STENT;  Surgeon: Luanne Bras, MD;  Location: [facility];  Service: Radiology;  Laterality: Left;    There were no vitals filed for this visit.   Subjective Assessment - 12/14/20 1451    Subjective nodded head    Patient is accompained by: Family member   husband   Currently in Pain? No/denies                 ADULT SLP TREATMENT - 12/14/20 1221      General Information   Behavior/Cognition Alert;Pleasant mood;Requires cueing;Doesn't follow directions;Lethargic      Treatment Provided   Treatment provided Dysphagia;Cognitive-Linquistic      Dysphagia Treatment   Temperature Spikes Noted No    Respiratory Status Room air    Oral Cavity - Dentition Adequate natural dentition    Treatment Methods Patient/caregiver education;Compensation strategy training    Patient observed directly with  PO's Yes    Type of PO's observed Thin liquids    Feeding Able to feed self    Liquids provided via Cup    Oral Phase Signs & Symptoms Oral holding    Pharyngeal Phase Signs & Symptoms Wet vocal quality    Type of cueing Verbal    Amount of cueing Minimal    Other treatment/comments SLP provided extensive education with patient and husband re: current diet recommendations (dysphagia 2 and thin liquids), recommended compensatory strategies, need for thorough oral care, and s/sx of aspiration given recent ED visit due to suspected aspiration PNA (see ED doc). Husband reported some coughing after thin liquids prompting ED visit as pt "panicked." Some deviations in current diet reported (bread, chicken, and curry). SLP emphasized fine chop diet is not regular textures, with handout provided re: appropriate solids. Handout with s/sx of aspiration also provided. Pt may warrant repeat swallow study to evaluate pharyngeal function and assess for risk of aspiration. SLP trialed thin liquids via cup with cued small sips. Possible wet vocal quality noted x1, in which pt unable to demo throat clear or cough with max modeling.      Cognitive-Linquistic Treatment   Treatment focused on Aphasia;Apraxia;Patient/family/caregiver education    Skilled Treatment Pt's husband reported 2-4 pictures were ineffective at home due to perseverations and inaccurate responses. Pt's  family continues to rely on yes/no questions. Rare attempts at verbalization noted. SLP trialed phonemes sounds, with pt able to produce /m and p/ with modeling. No other accurate production of sounds noted. SLP trialed gestures with "hi" and "bye" in which pt able to produce with ~75% accuracy. SLP discussed some favorite items to trial pictures of 2 to aid communication and decision making. Significant lethargy exhibited as session continued, with need to leave facility due to inabilty to stay awake.      Assessment / Recommendations / Plan   Plan  Continue with current plan of care      Progression Toward Goals   Progression toward goals Progressing toward goals            SLP Education - 12/14/20 1448    Education Details dysphagia 2 diet options, swallow strats, s/sx of aspiration, trialing choices of 2    Person(s) Educated Patient;Spouse    Methods Explanation;Demonstration;Verbal cues;Handout    Comprehension Verbalized understanding;Returned demonstration;Verbal cues required;Need further instruction            SLP Short Term Goals - 12/14/20 1222      SLP SHORT TERM GOAL #1   Title Pt will use mulitmodal communication (gesture, draw, write 1st letter etc) to augment verbal expression of basic wants/needs with usual mod A for 3 sessions    Time 3    Period Weeks    Status On-going      SLP SHORT TERM GOAL #2   Title Pt's caregivers will appropriately cue patient and use alternative communication means when needed with occasional min A from SLP over 2 sessions    Time 3    Period Weeks    Status On-going      SLP SHORT TERM GOAL #3   Title Pt will approximate one-word personally relevant responses to supplement alternatives means of communication with usual mod A over 2 sessions    Time 3    Period Weeks    Status On-going      SLP SHORT TERM GOAL #4   Title Pt will ID object in field of 4 to communicate wants/needs or demonstrate understanding with 75% accuracy given occasional min A over 3 sessions    Time 3    Period Weeks    Status On-going      SLP SHORT TERM GOAL #5   Title Pt will tolerate trials of dysphagia 3/mechanical soft consistency with no overt s/sx of aspiration given occasional min A over 3 sessions    Time 3    Period Weeks    Status On-going            SLP Long Term Goals - 12/14/20 1222      SLP LONG TERM GOAL #1   Title Pt will use mulitmodal communication (gesture, draw, write 1st letter etc) to augment verbal expression to meet needs at home with occasional mod A from  family.    Time 7    Period Weeks    Status On-going      SLP LONG TERM GOAL #2   Title Pt will correctly ID item in field of 6 to communicate wants/needs with 75% accuracy given occasional min A over 2 sessions    Time 7    Period Weeks    Status On-going      SLP LONG TERM GOAL #3   Title Pt will approximate 3 personally relevant one word responses to supplement alternatives means of communication with usual mod  A over 2 sessions    Time 7    Period Weeks    Status On-going      SLP LONG TERM GOAL #4   Title Caregivers will report improvements in communication effectiveness via QOL scale by last ST session    Time 7    Period Weeks    Status On-going      SLP LONG TERM GOAL #5   Title Pt will tolerate safest and least restricitive diet with no overt s/sx of aspiration reported or exhibited over 3 sessions    Time 7    Period Weeks    Status On-going            Plan - 12/14/20 1449    Clinical Impression Statement Reve was referred for OPST intervention to address severe verbal apraxia, severe expressive/mod receptive aphasia, and 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 Recent ED visit discussed given suspected aspiration PNA. SLP re-educated patient and husband on diet recommendation, swallow strategies, s/sx of aspiration, and importance of oral care. SLP trialed some phoneme production and use of gestures this session, with max SLP modeling required. Due to severity of apraxia, aphasia, and persistent dysphagia secondary to oral motor apraxia, SLP recommends skilled ST intervention to improve efficiency and accuracy of communication of basic wants/needs and to improve swallow function to reduce risk of aspiration and optimize QOL.    Speech Therapy Frequency 2x / week    Duration 8 weeks   or 17 total visits   Treatment/Interventions Aspiration precaution training;Trials of upgraded  texture/liquids;Oral motor exercises;Compensatory strategies;Pharyngeal strengthening exercises;Cueing hierarchy;Functional tasks;Patient/family education;Diet toleration management by SLP;Environmental controls;Cognitive reorganization;Multimodal communcation approach;Language facilitation;Compensatory techniques;Internal/external aids;SLP instruction and feedback    Potential to Achieve Goals Fair    Potential Considerations Severity of impairments;Co-morbidities;Ability to learn/carryover information    SLP Home Exercise Plan provided    Consulted and Agree with Plan of Care Patient;Family member/caregiver    Family Member Consulted husband           Patient will benefit from skilled therapeutic intervention in order to improve the following deficits and impairments:   Dysphagia, oropharyngeal phase  Verbal apraxia  Aphasia    Problem List Patient Active Problem List   Diagnosis Date Noted  . Right hemiplegia ([place])   . Urinary retention   . Hyperglycemia   . Essential hypertension   . Dysphagia, post-stroke   . 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 Left middle cerebral artery stroke ([place]) 11/01/2020  . Dysphagia 11/01/2020  . Middle cerebral artery stenosis, left 10/26/2020  . Acute CVA (cerebrovascular accident) ([place]) 10/24/2020  . left MCA stroke 10/23/2020  . Meningioma ([place]) 10/23/2020  . Cerebral edema ([place]) 10/23/2020  . Global aphasia 10/23/2020  . Hypertensive urgency 10/23/2020    Alinda Deem, MA CCC-SLP 12/14/2020, 2:53 PM  [facility] [address] [place], [place], [address] Phone: [phone]   Fax:  [phone]   Name: Mry Lamia MRN: [id number] Date of Birth: May 08, 1951

## 2020-12-14 NOTE — Therapy (Signed)
[facility] [address] [place], [place], [address] Phone: [phone]   Fax:  [phone]  Physical Therapy Treatment  Patient Details  Name: Brittany Griffith MRN: [id number] Date of Birth: [birth year] Referring Provider (PT): Lauraine Rinne, [place]   Encounter Date: 12/14/2020   PT End of Session - 12/14/20 1413    Visit Number 3    Number of Visits 21    Date for PT Re-Evaluation 02/07/21    Authorization Type [employer] Medicare; $30 copay per day for all disciplines, Eval /19/22,    Progress Note Due on Visit 10    PT Start Time 1145    PT Stop Time 1230    PT Time Calculation (min) 45 min    Equipment Utilized During Treatment Gait belt    Activity Tolerance Patient limited by lethargy;Patient limited by fatigue    Behavior During Therapy [facility] for tasks assessed/performed           Past Medical History:  Diagnosis Date  . Hypercholesteremia   . Hypertension   . Osteopenia   . Stroke [facility])     Past Surgical History:  Procedure Laterality Date  . IR ANGIO INTRA EXTRACRAN SEL COM CAROTID INNOMINATE UNI R MOD SED  10/26/2020  . IR INTRA CRAN STENT  10/26/2020  . IR RADIOLOGIST EVAL & MGMT  12/09/2020  . RADIOLOGY WITH ANESTHESIA Left 10/26/2020   Procedure: RADIOLOGY WITH ANESTHESIA LEFT MCA STENT;  Surgeon: Luanne Bras, MD;  Location: [facility];  Service: Radiology;  Laterality: Left;    There were no vitals filed for this visit.   Subjective Assessment - 12/14/20 1148    Subjective No new complaints. They have been walking outside with her. R arm sore from last session    Patient is accompained by: Family member              [facility] PT Assessment - 12/14/20 0001      Ambulation/Gait   Gait velocity 0.59 m/s      Berg Balance Test   Sit to Stand Needs minimal aid to stand or to stabilize    Standing Unsupported Unable to stand 30 seconds unassisted    Sitting with Back Unsupported but Feet Supported on  Floor or Stool Able to sit safely and securely 2 minutes    Stand to Sit Sits independently, has uncontrolled descent    Transfers Needs one person to assist    Standing Unsupported with Eyes Closed Able to stand 10 seconds with supervision    Standing Unsupported with Feet Together Able to place feet together independently and stand for 1 minute with supervision    From Standing, Reach Forward with Outstretched Arm Reaches forward but needs supervision    From Standing Position, Pick up Object from Floor Unable to try/needs assist to keep balance    From Standing Position, Turn to Look Behind Over each Shoulder Turn sideways only but maintains balance    Turn 360 Degrees Needs assistance while turning    Standing Unsupported, Alternately Place Feet on Step/Stool Needs assistance to keep from falling or unable to try    Standing Unsupported, One Foot in Front Able to take small step independently and hold 30 seconds    Standing on One Leg Unable to try or needs assist to prevent fall    Total Score 18    Berg comment: unable to accurately score certain sections as paient unable to follow commands safely even with interpretter  [facility] Adult PT Treatment/Exercise - 12/14/20 0001      Transfers   Transfers Sit to Stand    Sit to Stand 4: Min guard    Stand to Sit 4: Min guard      Ambulation/Gait   Ambulation/Gait Yes    Ambulation/Gait Assistance 4: Min guard    Ambulation Distance (Feet) 230 Feet    Assistive device Small based quad cane    Gait Pattern Step-through pattern    Ambulation Surface Level;Indoor               Balance Exercises - 12/14/20 0001      Balance Exercises: Standing   Step Ups Forward;Lateral;4 inch;UE support 1;Limitations    Step Ups Limitations 10x ea. extremity and direction    Retro Gait Upper extremity support;5 reps;Limitations    Retro Gait Limitations 5 trips in // bars    Sidestepping Upper extremity  support;5 reps;Limitations    Sidestepping Limitations 5 trips in // bars               PT Short Term Goals - 11/29/20 1429      PT SHORT TERM GOAL #1   Title pt will be able to ambulate 300' with quad cane with CGA and without evidence of LOB to improve short distance ambulation.    Baseline 115' quad cane CGA-MinA for occaisonal LOB (11/29/20)    Time 5    Period Weeks    Status New    Target Date 01/03/21      PT SHORT TERM GOAL #2   Title Pt will be able to go up and down12 steps with use of 1 rail and CGA to improve stair negotiation in community    Baseline not tried (11/29/20)    Time 5    Period Weeks    Status New    Target Date 01/03/21      PT SHORT TERM GOAL #3   Title Pt and family will demo compliance with at least 5 days a week walking program to improve walking endurance and practice gait.    Baseline Household ambulator only (11/29/20)    Time 5    Period Weeks    Status New    Target Date 01/03/21             PT Long Term Goals - 11/29/20 1433      PT LONG TERM GOAL #1   Title Pt will be able to ambulate on non compliant surface for 200 feet with appropriate AD with SBA to improve community negotiation.    Baseline not attempted (eval)    Time 10    Period Weeks    Status New    Target Date 02/07/21      PT LONG TERM GOAL #2   Title Pt will be able to ambulate 1050' with appropriate AD with SBA to improve community ambulation    Baseline 115' with quad cane CGA to min A (11/29/20)    Time 10    Period Weeks    Status New    Target Date 02/07/21      PT LONG TERM GOAL #3   Title Pt will demo 0.90m/s improvement on 10 meter walk test to improve walking speed and improve community ambulation    Baseline Not assessed on eval (11/29/20)    Time 10    Period Weeks    Status New    Target Date 02/07/21      PT LONG  TERM GOAL #4   Title Pt will demo 4 points improvement on BBS to reduce fall risk    Baseline not assessed on eval (11/29/20)     Time 10    Period Weeks    Status New    Target Date 02/07/21                 Plan - 12/14/20 1414    Clinical Impression Statement During todays skilled session, BERG balance test and gait velocity were assessed, continued strengthening of RLE, advancing to stepping tasks and gait training w/ UE support.  processing delays evident as well as languag barriers, patient unable to comprehend BERG commands and results may be skewed.    Personal Factors and Comorbidities Age;Behavior Pattern;Past/Current Experience;Time since onset of injury/illness/exacerbation;Transportation    Examination-Activity Limitations Bathing;Bed Mobility;Bend;Caring for Others;Carry;Dressing;Hygiene/Grooming;Lift;Sleep;Squat;Stairs;Stand;Toileting;Transfers    Examination-Participation Restrictions Cleaning;Community Activity;Driving;Laundry;Medication Management;Meal Prep;Personal Finances;Shop;Yard Work    [employer] Evolving/Moderate complexity    Rehab Potential Good    PT Frequency 2x / week    PT Duration Other (comment)   10 weeks   PT Treatment/Interventions ADLs/Self Care Home Management;Moist Heat;Gait training;Stair training;Functional mobility training;Therapeutic activities;Therapeutic exercise;Balance training;Neuromuscular re-education;Cognitive remediation;Patient/family education;Orthotic Fit/Training;Manual techniques;Passive range of motion;Energy conservation;Visual/perceptual remediation/compensation;Joint Manipulations    PT Next Visit Plan Continue gait training and balance tasks, add compliant surfaces    PT Home Exercise Plan Access Code [email or web address]     Consulted and Agree with Plan of Care Patient;Family member/caregiver           Patient will benefit from skilled therapeutic intervention in order to improve the following deficits and impairments:  Abnormal gait,Decreased activity tolerance,Decreased cognition,Decreased balance,Decreased coordination,Decreased  safety awareness,Decreased range of motion,Decreased mobility,Decreased knowledge of use of DME,Decreased knowledge of precautions,Decreased endurance,Decreased strength,Difficulty walking,Impaired flexibility,Impaired perceived functional ability,Increased fascial restricitons,Impaired sensation,Impaired tone,Impaired UE functional use,Postural dysfunction,Pain  Visit Diagnosis: Unsteadiness on feet  Hemiplegia and hemiparesis following cerebral infarction affecting right dominant side (HCC)  Muscle weakness (generalized)  Other abnormalities of gait and mobility     Problem List Patient Active Problem List   Diagnosis Date Noted  . Right hemiplegia ([place])   . Urinary retention   . Hyperglycemia   . Essential hypertension   . Dysphagia, post-stroke   . 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 Left middle cerebral artery stroke ([place]) 11/01/2020  . Dysphagia 11/01/2020  . Middle cerebral artery stenosis, left 10/26/2020  . Acute CVA (cerebrovascular accident) ([place]) 10/24/2020  . left MCA stroke 10/23/2020  . Meningioma ([place]) 10/23/2020  . Cerebral edema ([place]) 10/23/2020  . Global aphasia 10/23/2020  . Hypertensive urgency 10/23/2020    [name] PT 12/14/2020, 2:28 PM  [facility] [address] [place] [place], [place], [address] Phone: [phone]   Fax:  [phone]  Name: Brittany Griffith MRN: [id number] Date of Birth: 1951/07/10

## 2020-12-16 ENCOUNTER — Other Ambulatory Visit: Payer: Self-pay

## 2020-12-16 ENCOUNTER — Ambulatory Visit: Payer: Medicare HMO

## 2020-12-16 ENCOUNTER — Ambulatory Visit: Payer: Medicare HMO | Admitting: Occupational Therapy

## 2020-12-16 DIAGNOSIS — I69351 Hemiplegia and hemiparesis following cerebral infarction affecting right dominant side: Secondary | ICD-10-CM

## 2020-12-16 DIAGNOSIS — R2689 Other abnormalities of gait and mobility: Secondary | ICD-10-CM

## 2020-12-16 DIAGNOSIS — R2681 Unsteadiness on feet: Secondary | ICD-10-CM

## 2020-12-16 DIAGNOSIS — R4701 Aphasia: Secondary | ICD-10-CM

## 2020-12-16 DIAGNOSIS — R482 Apraxia: Secondary | ICD-10-CM

## 2020-12-16 DIAGNOSIS — I69318 Other symptoms and signs involving cognitive functions following cerebral infarction: Secondary | ICD-10-CM

## 2020-12-16 DIAGNOSIS — R1312 Dysphagia, oropharyngeal phase: Secondary | ICD-10-CM

## 2020-12-16 DIAGNOSIS — R208 Other disturbances of skin sensation: Secondary | ICD-10-CM

## 2020-12-16 DIAGNOSIS — M6281 Muscle weakness (generalized): Secondary | ICD-10-CM

## 2020-12-16 NOTE — Therapy (Signed)
[facility] [address] [place], [place], [address] Phone: [phone]   Fax:  [phone]  Physical Therapy Treatment  Patient Details  Name: Brittany Griffith MRN: [id number] Date of Birth: 06-02-1951 Referring Provider (PT): Lauraine Rinne, [place]   Encounter Date: 12/16/2020   PT End of Session - 12/16/20 1108    Visit Number 4    Number of Visits 21    Date for PT Re-Evaluation 02/07/21    Authorization Type [employer]; $30 copay per day for all disciplines, Eval /19/22,    Progress Note Due on Visit 10    PT Start Time 1105    PT Stop Time 1150    PT Time Calculation (min) 45 min    Equipment Utilized During Treatment Gait belt    Activity Tolerance Patient limited by lethargy;Patient limited by fatigue    Behavior During Therapy [facility] for tasks assessed/performed           Past Medical History:  Diagnosis Date  . Hypercholesteremia   . Hypertension   . Osteopenia   . Stroke [facility])     Past Surgical History:  Procedure Laterality Date  . IR ANGIO INTRA EXTRACRAN SEL COM CAROTID INNOMINATE UNI R MOD SED  10/26/2020  . IR INTRA CRAN STENT  10/26/2020  . IR RADIOLOGIST EVAL & MGMT  12/09/2020  . RADIOLOGY WITH ANESTHESIA Left 10/26/2020   Procedure: RADIOLOGY WITH ANESTHESIA LEFT MCA STENT;  Surgeon: Luanne Bras, MD;  Location: [facility];  Service: Radiology;  Laterality: Left;    There were no vitals filed for this visit.   Subjective Assessment - 12/16/20 1109    Subjective no falls.    Patient is accompained by: Family member    Currently in Pain? Yes    Pain Score 5     Pain Location Shoulder    Pain Orientation Right    Pain Descriptors / Indicators Aching              - Gait training with quad cane: - 1 x 115' CGA - walking up and down incline and decline, fwd and bwd: 5x each with CGA - 2 x 220' with finding red color and putting L finger on it and second lap she was asked to find  black color and put her finger on it. She was able to find red color with 50% accuracy but <10% accuracy for black color - kneeling on bil knees and kneeling on R elbow while patient stacks cones of different size and color in front of her. Pt needed max cueing with picking up correct color of cone and where to stack it. - Standing to deep squatting: 10x with L UE support - Standing on airex foam with box taps: 14" box: 10x min A - Stair training: using rail on L 8x steps, coming down sideways with step to pattern and going up with reciprocal pattern. Using rail on R 8x steps-going up sideways with step to pattern and coming down with reciprocal pattern. Pt needed moderate cueing to go up with rail on R and going up sideways as she kept crossing her legs when going up.                          PT Short Term Goals - 11/29/20 1429      PT SHORT TERM GOAL #1   Title pt will be able to ambulate 300' with quad cane with  CGA and without evidence of LOB to improve short distance ambulation.    Baseline 115' quad cane CGA-MinA for occaisonal LOB (11/29/20)    Time 5    Period Weeks    Status New    Target Date 01/03/21      PT SHORT TERM GOAL #2   Title Pt will be able to go up and down12 steps with use of 1 rail and CGA to improve stair negotiation in community    Baseline not tried (11/29/20)    Time 5    Period Weeks    Status New    Target Date 01/03/21      PT SHORT TERM GOAL #3   Title Pt and family will demo compliance with at least 5 days a week walking program to improve walking endurance and practice gait.    Baseline Household ambulator only (11/29/20)    Time 5    Period Weeks    Status New    Target Date 01/03/21             PT Long Term Goals - 11/29/20 1433      PT LONG TERM GOAL #1   Title Pt will be able to ambulate on non compliant surface for 200 feet with appropriate AD with SBA to improve community negotiation.    Baseline not attempted (eval)     Time 10    Period Weeks    Status New    Target Date 02/07/21      PT LONG TERM GOAL #2   Title Pt will be able to ambulate 1050' with appropriate AD with SBA to improve community ambulation    Baseline 115' with quad cane CGA to min A (11/29/20)    Time 10    Period Weeks    Status New    Target Date 02/07/21      PT LONG TERM GOAL #3   Title Pt will demo 0.70m/s improvement on 10 meter walk test to improve walking speed and improve community ambulation    Baseline Not assessed on eval (11/29/20)    Time 10    Period Weeks    Status New    Target Date 02/07/21      PT LONG TERM GOAL #4   Title Pt will demo 4 points improvement on BBS to reduce fall risk    Baseline not assessed on eval (11/29/20)    Time 10    Period Weeks    Status New    Target Date 02/07/21                 Plan - 12/16/20 1201    Clinical Impression Statement Pt demonstrated good balance with walking with quad cane but requires CGA for safety. Patient has poor safety awareness and judgement that puts her at risk for fall.    Personal Factors and Comorbidities Age;Behavior Pattern;Past/Current Experience;Time since onset of injury/illness/exacerbation;Transportation    Examination-Activity Limitations Bathing;Bed Mobility;Bend;Caring for Others;Carry;Dressing;Hygiene/Grooming;Lift;Sleep;Squat;Stairs;Stand;Toileting;Transfers    Examination-Participation Restrictions Cleaning;Community Activity;Driving;Laundry;Medication Management;Meal Prep;Personal Finances;Shop;Yard Work    [employer] Evolving/Moderate complexity    Rehab Potential Good    PT Frequency 2x / week    PT Duration Other (comment)   10 weeks   PT Treatment/Interventions ADLs/Self Care Home Management;Moist Heat;Gait training;Stair training;Functional mobility training;Therapeutic activities;Therapeutic exercise;Balance training;Neuromuscular re-education;Cognitive remediation;Patient/family education;Orthotic  Fit/Training;Manual techniques;Passive range of motion;Energy conservation;Visual/perceptual remediation/compensation;Joint Manipulations    PT Next Visit Plan Continue gait training and balance tasks, add compliant surfaces  PT Home Exercise Plan Access Code [email or web address]     Consulted and Agree with Plan of Care Patient;Family member/caregiver           Patient will benefit from skilled therapeutic intervention in order to improve the following deficits and impairments:  Abnormal gait,Decreased activity tolerance,Decreased cognition,Decreased balance,Decreased coordination,Decreased safety awareness,Decreased range of motion,Decreased mobility,Decreased knowledge of use of DME,Decreased knowledge of precautions,Decreased endurance,Decreased strength,Difficulty walking,Impaired flexibility,Impaired perceived functional ability,Increased fascial restricitons,Impaired sensation,Impaired tone,Impaired UE functional use,Postural dysfunction,Pain  Visit Diagnosis: Unsteadiness on feet  Muscle weakness (generalized)  Other abnormalities of gait and mobility     Problem List Patient Active Problem List   Diagnosis Date Noted  . Right hemiplegia ([place])   . Urinary retention   . Hyperglycemia   . Essential hypertension   . Dysphagia, post-stroke   . 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 Dysphagia 11/01/2020  . Middle cerebral artery stenosis, left 10/26/2020  . Acute CVA (cerebrovascular accident) ([place]) 10/24/2020  . left MCA stroke 10/23/2020  . Meningioma ([place]) 10/23/2020  . Cerebral edema ([place]) 10/23/2020  . Global aphasia 10/23/2020  . Hypertensive urgency 10/23/2020    Kerrie Pleasure, PT 12/16/2020, 12:03 PM  [facility] [address] [place], [place], [address] Phone: [phone]   Fax:  [phone]  Name: Brittany Griffith MRN: [id number] Date of Birth: Sep 20, 1950

## 2020-12-16 NOTE — Patient Instructions (Signed)
  For swallowing: Brittany Griffith very small teaspoons of water and tell her to swallow as hard as she can  Do 10-20 repetitions of this, 4-5 times a day  For speaking: Work with Brittany Griffith with short and/or common words in her first language. Imitate them and see if she can say them back to you.  Work with "m" and "p" sounds  Simple words - English Moo Me [place]

## 2020-12-16 NOTE — Therapy (Deleted)
[facility] [address] [place], [place], [address] Phone: [phone]   Fax:  [phone]  Patient Details  Name: Brittany Griffith MRN: [id number] Date of Birth: Sep 09, 1950 Referring Provider:  Cathlyn Parsons, PA-C  Encounter Date: 12/16/2020   [facility] 12/16/2020, 1:16 PM  [facility] [address] [place], [place], [address] Phone: [phone]   Fax:  [phone]

## 2020-12-16 NOTE — Therapy (Signed)
[facility] [address] [place], [place], [address] Phone: [phone]   Fax:  [phone]  Occupational Therapy Treatment  Patient Details  Name: Brittany Griffith MRN: [id number] Date of Birth: 30-Jul-1951 No data recorded  Encounter Date: 12/16/2020   OT End of Session - 12/16/20 1239    Visit Number 2    Number of Visits 25    Date for OT Re-Evaluation 03/06/21    Authorization Type [employer] MCR    Authorization - Visit Number 2    Authorization - Number of Visits 10    Progress Note Due on Visit 10    OT Start Time 1238    OT Stop Time 1316    OT Time Calculation (min) 38 min    Activity Tolerance Patient tolerated treatment well    Behavior During Therapy [facility] for tasks assessed/performed           Past Medical History:  Diagnosis Date  . Hypercholesteremia   . Hypertension   . Osteopenia   . Stroke [facility])     Past Surgical History:  Procedure Laterality Date  . IR ANGIO INTRA EXTRACRAN SEL COM CAROTID INNOMINATE UNI R MOD SED  10/26/2020  . IR INTRA CRAN STENT  10/26/2020  . IR RADIOLOGIST EVAL & MGMT  12/09/2020  . RADIOLOGY WITH ANESTHESIA Left 10/26/2020   Procedure: RADIOLOGY WITH ANESTHESIA LEFT MCA STENT;  Surgeon: Luanne Bras, MD;  Location: [facility];  Service: Radiology;  Laterality: Left;    There were no vitals filed for this visit.   Subjective Assessment - 12/16/20 1240    Subjective  Pt presents today with brother and husband.    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 PMH: HTN, HLD, Meningioma    Limitations fall risk, aphasic,    Currently in Pain? Yes    Pain Score 6     Pain Location Shoulder    Pain Orientation Right    Pain Descriptors / Indicators Aching    Pain Type Chronic pain    Pain Onset More than a month ago    Pain Frequency Intermittent    Aggravating Factors  only  when using it          Self Passive Range of Motion with RUE in sitting x 10 and reaching to floor. Repeated in supine.   Pt has 1-finger subluxation in anterior RUE shoulder.   Education provided to family and patient on positioning and support of RUE for decreasing risk of further injury and increased protection.   Manual Therapy provided at RUE wrist and hand - educated on self passive and caregiver passive ROM on RUE hand for flexion  Weight bearing in RUE for inhibition of tone and facilitation of muscle activation while pushing OT leg with activation noted.  Pt required total assistance for maintaining hand position on mat for weight bearing. Weight bearing in Sidelying on shoulder while facilitating active elbow extension and flexion. Small activation noted.                       OT Education - 12/16/20 1315    Education Details self PROM in sitting. Education provided to family and patient on positioning and support of RUE for decreasing risk of further injury and increased protection.    Person(s) Educated Patient;Spouse;Other (comment)   brother and husband   Methods  Explanation;Handout;Demonstration    Comprehension Verbalized understanding;Returned demonstration;Need further instruction            OT Short Term Goals - 12/16/20 1442      OT SHORT TERM GOAL #1   Title Pt/family Independent with initial HEP    Time 4    Period Weeks    Status On-going      OT SHORT TERM GOAL #2   Title Pt/family independent with splint wear and care prn    Time 4    Period Weeks    Status New      OT SHORT TERM GOAL #3   Title Pt to cut food w/ AE prn    Time 4    Period Weeks    Status New      OT SHORT TERM GOAL #4   Title Pt/family to id A/E and one handed techniques to assist with BADLs (dressing, bathing, grooming, toileting)    Time 4    Period Weeks    Status New      OT SHORT TERM GOAL #5   Title Pt to demo 25* Rt shoulder flexion in prep for  low level reaching    Time 4    Period Weeks    Status New      OT SHORT TERM GOAL #6   Title Pt to demo 25% finger flexion and 10% finger extension in prep for grasping/releasing Rt hand    Time 4    Period Weeks    Status New             OT Long Term Goals - 12/07/20 1112      OT LONG TERM GOAL #1   Title Pt to be independent with updated HEP    Time 12    Period Weeks    Status New      OT LONG TERM GOAL #2   Title Pt to be mod I with all BADLS using A/E prn (grooming, dressing, bathing, and toileting)    Time 12    Period Weeks    Status New      OT LONG TERM GOAL #3   Title Pt to prepare simple snack, sandwich, and/or microwaveable items mod I level    Time 12    Period Weeks    Status New      OT LONG TERM GOAL #4   Title Pt to perform RUE reaching in low ranges to grasp and release 1-2" objects consistently    Time 12    Period Weeks    Status New      OT LONG TERM GOAL #5   Title Pt to use RUE as min assist for BADLS and bilateral tasks    Time 12    Period Weeks    Status New      Long Term Additional Goals   Additional Long Term Goals Yes      OT LONG TERM GOAL #6   Title Pt to improve RUE function as evidenced by performing 6 blocks on Box & Blocks test    Baseline unable, no functional movement    Time 12    Period Weeks    Status New                 Plan - 12/16/20 1432    Clinical Impression Statement Pt progressing towards goals. Pt has subluxation in RUE shoulder (anteriorly) and would benefit from further instruction on positioning and support.  OT Occupational Profile and History Detailed Assessment- Review of Records and additional review of physical, cognitive, psychosocial history related to current functional performance    Occupational performance deficits (Please refer to evaluation for details): ADL's;IADL's;Social Participation;Work;Leisure    Body Structure / Function / Physical Skills ADL;Strength;Pain;Body  mechanics;Edema;Proprioception;UE functional use;IADL;ROM;Coordination;Mobility;Sensation;FMC    Cognitive Skills Attention;Understand;Perception    Rehab Potential Good    Clinical Decision Making Several treatment options, min-mod task modification necessary    Comorbidities Affecting Occupational Performance: May have comorbidities impacting occupational performance    Modification or Assistance to Complete Evaluation  Min-Moderate modification of tasks or assist with assess necessary to complete eval    OT Frequency 2x / week    OT Duration 12 weeks   plus eval   OT Treatment/Interventions Self-care/ADL training;Moist Heat;DME and/or AE instruction;Splinting;Therapeutic activities;Therapeutic exercise;Cognitive remediation/compensation;Coping strategies training;Neuromuscular education;Functional Mobility Training;Passive range of motion;Visual/perceptual remediation/compensation;Patient/family education;Manual Therapy;Electrical Stimulation;Energy conservation    Plan continue HEP, weight bearing?, NMR RUE, initiate ADLs hemi techniques    Consulted and Agree with Plan of Care Patient;Family member/caregiver    Family Member Consulted Husband and son           Patient will benefit from skilled therapeutic intervention in order to improve the following deficits and impairments:   Body Structure / Function / Physical Skills: ADL,Strength,Pain,Body mechanics,Edema,Proprioception,UE functional use,IADL,ROM,Coordination,Mobility,Sensation,FMC Cognitive Skills: Attention,Understand,Perception     Visit Diagnosis: No diagnosis found.    Problem List Patient Active Problem List   Diagnosis Date Noted  . Right hemiplegia ([place])   . Urinary retention   . Hyperglycemia   . Essential hypertension   . Dysphagia, post-stroke   . 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 Dysphagia 11/01/2020  .  Middle cerebral artery stenosis, left 10/26/2020  . Acute CVA (cerebrovascular accident) ([place]) 10/24/2020  . left MCA stroke 10/23/2020  . Meningioma ([place]) 10/23/2020  . Cerebral edema ([place]) 10/23/2020  . Global aphasia 10/23/2020  . Hypertensive urgency 10/23/2020    Zachery Conch MOT, OTR/L  12/16/2020, 2:42 PM  [facility] [address] [place], [place], [address] Phone: [phone]   Fax:  [phone]  Name: Brittany Griffith MRN: [id number] Date of Birth: Jul 06, 1951

## 2020-12-16 NOTE — Patient Instructions (Signed)
   Self Passive Range of Motion   In sitting, clasp hands together and bend down towards toes for a stretch for your shoulder. Repeat 10 times and do 2-3 times a day.

## 2020-12-16 NOTE — Therapy (Signed)
[facility] [address] [place], [place], [address] Phone: [phone]   Fax:  [phone]  Speech Language Pathology Treatment  Patient Details  Name: Brittany Griffith MRN: [id number] Date of Birth: Jul 03, 1951 Referring Provider (SLP): Cathlyn Parsons, PA-C   Encounter Date: 12/16/2020   End of Session - 12/16/20 1314    Visit Number 4    Number of Visits 17    Date for SLP Re-Evaluation 02/27/21    Authorization Type [employer]    SLP Start Time 1150    SLP Stop Time  1230    SLP Time Calculation (min) 40 min    Activity Tolerance Patient tolerated treatment well           Past Medical History:  Diagnosis Date  . Hypercholesteremia   . Hypertension   . Osteopenia   . Stroke [facility])     Past Surgical History:  Procedure Laterality Date  . IR ANGIO INTRA EXTRACRAN SEL COM CAROTID INNOMINATE UNI R MOD SED  10/26/2020  . IR INTRA CRAN STENT  10/26/2020  . IR RADIOLOGIST EVAL & MGMT  12/09/2020  . RADIOLOGY WITH ANESTHESIA Left 10/26/2020   Procedure: RADIOLOGY WITH ANESTHESIA LEFT MCA STENT;  Surgeon: Luanne Bras, MD;  Location: [facility];  Service: Radiology;  Laterality: Left;    There were no vitals filed for this visit.   Subjective Assessment - 12/16/20 1156    Subjective Pt has had homogenous soups, mostly, since Wednesday diet explanation.    Patient is accompained by: Family member   husband and brother   Currently in Pain? Yes    Pain Score 5     Pain Location Shoulder                 ADULT SLP TREATMENT - 12/16/20 1206      General Information   Behavior/Cognition Alert;Pleasant mood;Requires cueing;Doesn't follow directions;Lethargic      Treatment Provided   Treatment provided Cognitive-Linquistic;Dysphagia      Dysphagia Treatment   Other treatment/comments Brother inquired how to assist pt swallowing. SLP told pt/husband/brother that small sips with effortful swallow  would be good exercise. SLP got water for trials of this. Labial leakage on rt observed with first 1/16 teaspoon trial so SLP placed trial in on lt labial side for remainder of trials. SLP educated husband and brother to assess verbalization  with /a/ every 6-7 swallows. Today, pt without any overt s/sx aspiration with 14 trials. Effortful swallow achieved ~30% of the time with consistent verbal cue for strong swallow, and a model. Pt husband provided 1/16 teaspoons to pt x4 with good success.      Pain Assessment   Pain Assessment 0-10      Cognitive-Linquistic Treatment   Treatment focused on Patient/family/caregiver education    Skilled Treatment Brother with quesitons about how to assist pt's conversation at home. SLP educated brother and husband (see pt instructions) about what types of words/sounds are good to practice at pt's level of verbal expression. Told brother and husbnad that it is still early post-CVA but pt may likely require some sort of augmentative communication to assist pt to communicate in teh future.      Assessment / Recommendations / Plan   Plan Continue with current plan of care      Progression Toward Goals   Progression toward goals Progressing toward goals            SLP Education - 12/16/20 1313  Education Details see body of note for details    Person(s) Educated Patient;Caregiver(s)    Methods Explanation;Demonstration    Comprehension Verbalized understanding            SLP Short Term Goals - 12/16/20 1315      SLP SHORT TERM GOAL #1   Title Pt will use mulitmodal communication (gesture, draw, write 1st letter etc) to augment verbal expression of basic wants/needs with usual mod A for 3 sessions    Time 3    Period Weeks    Status On-going      SLP SHORT TERM GOAL #2   Title Pt's caregivers will appropriately cue patient and use alternative communication means when needed with occasional min A from SLP over 2 sessions    Time 3    Period  Weeks    Status On-going      SLP SHORT TERM GOAL #3   Title Pt will approximate one-word personally relevant responses to supplement alternatives means of communication with usual mod A over 2 sessions    Time 3    Period Weeks    Status On-going      SLP SHORT TERM GOAL #4   Title Pt will ID object in field of 4 to communicate wants/needs or demonstrate understanding with 75% accuracy given occasional min A over 3 sessions    Time 3    Period Weeks    Status On-going      SLP SHORT TERM GOAL #5   Title Pt will tolerate trials of dysphagia 3/mechanical soft consistency with no overt s/sx of aspiration given occasional min A over 3 sessions    Time 3    Period Weeks    Status On-going            SLP Long Term Goals - 12/16/20 1315      SLP LONG TERM GOAL #1   Title Pt will use mulitmodal communication (gesture, draw, write 1st letter etc) to augment verbal expression to meet needs at home with occasional mod A from family.    Time 7    Period Weeks    Status On-going      SLP LONG TERM GOAL #2   Title Pt will correctly ID item in field of 6 to communicate wants/needs with 75% accuracy given occasional min A over 2 sessions    Time 7    Period Weeks    Status On-going      SLP LONG TERM GOAL #3   Title Pt will approximate 3 personally relevant one word responses to supplement alternatives means of communication with usual mod A over 2 sessions    Time 7    Period Weeks    Status On-going      SLP LONG TERM GOAL #4   Title Caregivers will report improvements in communication effectiveness via QOL scale by last ST session    Time 7    Period Weeks    Status On-going      SLP LONG TERM GOAL #5   Title Pt will tolerate safest and least restricitive diet with no overt s/sx of aspiration reported or exhibited over 3 sessions    Time 7    Period Weeks    Status On-going            Plan - 12/16/20 1315    Clinical Impression Statement Brittany Griffith was referred for OPST  intervention to address severe verbal apraxia, severe expressive/mod receptive aphasia, and mild to mod  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 Recent ED visit discussed given suspected aspiration PNA. SLP re-educated patient and husband on diet recommendation, swallow strategies, s/sx of aspiration, and importance of oral care. SLP trialed some phoneme production and use of gestures this session, with max SLP modeling required. Due to severity of apraxia, aphasia, and persistent dysphagia secondary to oral motor apraxia, SLP recommends skilled ST intervention to improve efficiency and accuracy of communication of basic wants/needs and to improve swallow function to reduce risk of aspiration and optimize QOL.    Speech Therapy Frequency 2x / week    Duration 8 weeks   or 17 total visits   Treatment/Interventions Aspiration precaution training;Trials of upgraded texture/liquids;Oral motor exercises;Compensatory strategies;Pharyngeal strengthening exercises;Cueing hierarchy;Functional tasks;Patient/family education;Diet toleration management by SLP;Environmental controls;Cognitive reorganization;Multimodal communcation approach;Language facilitation;Compensatory techniques;Internal/external aids;SLP instruction and feedback    Potential to Achieve Goals Fair    Potential Considerations Severity of impairments;Co-morbidities;Ability to learn/carryover information    SLP Home Exercise Plan provided    Consulted and Agree with Plan of Care Patient;Family member/caregiver    Family Member Consulted husband           Patient will benefit from skilled therapeutic intervention in order to improve the following deficits and impairments:   Verbal apraxia  Aphasia  Dysphagia, oropharyngeal phase    Problem List Patient Active Problem List   Diagnosis Date Noted  . Right hemiplegia ([place])   . Urinary retention   . Hyperglycemia   .  Essential hypertension   . Dysphagia, post-stroke   . 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 Left middle cerebral artery stroke ([place]) 11/01/2020  . Dysphagia 11/01/2020  . Middle cerebral artery stenosis, left 10/26/2020  . Acute CVA (cerebrovascular accident) ([place]) 10/24/2020  . left MCA stroke 10/23/2020  . Meningioma ([place]) 10/23/2020  . Cerebral edema ([place]) 10/23/2020  . Global aphasia 10/23/2020  . Hypertensive urgency 10/23/2020    [facility], CCC-SLP  12/16/2020, 1:16 PM  [facility] [address] [place], [place], [address] Phone: [phone]   Fax:  [phone]   Name: Brittany Griffith MRN: [id number] Date of Birth: Jun 17, 1951

## 2020-12-20 ENCOUNTER — Encounter: Payer: Self-pay | Admitting: Adult Health

## 2020-12-20 ENCOUNTER — Ambulatory Visit: Payer: Medicare HMO | Admitting: Adult Health

## 2020-12-20 VITALS — BP 121/75 | HR 80 | Ht 60.0 in | Wt 127.0 lb

## 2020-12-20 DIAGNOSIS — I639 Cerebral infarction, unspecified: Secondary | ICD-10-CM | POA: Diagnosis not present

## 2020-12-20 DIAGNOSIS — I10 Essential (primary) hypertension: Secondary | ICD-10-CM | POA: Diagnosis not present

## 2020-12-20 DIAGNOSIS — D329 Benign neoplasm of meninges, unspecified: Secondary | ICD-10-CM

## 2020-12-20 DIAGNOSIS — I63512 Cerebral infarction due to unspecified occlusion or stenosis of left middle cerebral artery: Secondary | ICD-10-CM | POA: Diagnosis not present

## 2020-12-20 DIAGNOSIS — R569 Unspecified convulsions: Secondary | ICD-10-CM

## 2020-12-20 DIAGNOSIS — E785 Hyperlipidemia, unspecified: Secondary | ICD-10-CM

## 2020-12-20 MED ORDER — ATORVASTATIN CALCIUM 80 MG PO TABS: 80.0000 mg | ORAL_TABLET | Freq: Every day | ORAL | 0 refills | Status: AC

## 2020-12-20 NOTE — Patient Instructions (Signed)
Continue working with neuro rehab therapies - has been making great progress thus far!  Contact Dr. Arlean Hopping office regarding follow up from stent and ongoing use of [facility]  Office number: [phone]  Continue aspirin 81 mg daily and Brilinta (ticagrelor) 90 mg bid  and atorvastatin  for secondary stroke prevention  Continue to follow up with PCP regarding cholesterol and blood pressure management  Maintain strict control of hypertension with blood pressure goal below 130/90 and cholesterol with LDL cholesterol (bad cholesterol) goal below 70 mg/dL.       Followup in the future with me in 3 months or call earlier if needed       Thank you for coming to see [place] at [facility]. I hope we have been able to provide you high quality care today.  You may receive a patient satisfaction survey over the next few weeks. We would appreciate your feedback and comments so that we may continue to improve ourselves and the health of our patients.

## 2020-12-20 NOTE — Progress Notes (Signed)
[facility] [address] [place]. [place] [address] [phone]       HOSPITAL FOLLOW UP NOTE  Ms. Theodis Blaze Date of Birth:  1950/12/06 Medical Record Number:  [id number]   Reason for Referral:  hospital stroke follow up    SUBJECTIVE:   CHIEF COMPLAINT:  Chief Complaint  Patient presents with  . Follow-up    TR with brother who is speaking/interpreting for pt Pt is having speech difficulty,  shortness of breath, dizziness, R side weakness/pain, R leg drag.     HPI:   Ms.Brittany Chiukwokis a 70 y.o.femalewith history of hypertension who presented to [facility] ED on 10/23/2020 after being found sitting on the floor slumped over, nonverbal and unable to follow commands, but awake, tracking with her eyes and crying. Husband then liftedhiswife off the floor and noticed right-sided weakness and a right-sided facial droop. He called 911 with reported same symptoms on EMS arrival and resolution approximately 20 minutes later.  Personally reviewed hospitalization pertinent progress notes, lab work and imaging with summary provided.  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 Neuro worsening on 3/14 with imaging showing extension of left MCA infarct and repeat CTA 3/15 showing some mild bilateral M1 high-grade stenosis - s/p L M1 stent 3/16. On 3/17, worsening right-sided weakness with CT showing left large MCA infarct. MRI 3/20 large left MCA infarct with MRA questionable stent occlusion.  Recommended continued aspirin and Brilinta for 3 months post stent and follow-up with Dr. Estanislado Pandy 2 weeks after discharge.  Asymptomatic multiple left hemisphere meningiomas with the largest associated with mild edema and minimal rightward shift with neuro surge consult no intervention indicated at that time.  Concern of possible partial complex seizure with EEG showing cortical dysfunction and left temporal region and initiated Keppra 500 mg twice  daily for seizure prophylaxis.  LDL 159 - started atorvastatin 80 mg daily.  Residual deficits of global aphasia, R>L hemiparesis and dysphagia.  Evaluated by therapies who recommended discharge to CIR for ongoing therapy needs.  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 Stent occlusion  CT head 3/13 no acute infarct   CTA head and neck 3/13 bilateral M1 high-grade stenosis and bilateral P2 moderate stenosis.   MRI 10/23/2020 showed 3 punctate infarcts at left insular cortex.  Neuro worsening with CT 3/14 showed extension of left MCA infarct.   MRI 3/14 confirmed left MCA infarct extension.   Repeat CTA head 3/15 similar bilateral M1 high-grade stenosis.   Status post left M1 stent on 10/26/2020.  However, patient symptoms continue to worsen with right hemiplegia, therefore3/17/2022 CT repeat which showed left large MCA infarct.   MRI 3/20 large left MCA infarct  MRA signal loss related to left MCA stents, ? stent occlusion   No further intervention at this time. Plan to follow-up with Dr. Estanislado Pandy in clinic 2 weeks after discharge  EEG negative for seizure.   EF 60 to 65%.  [id number]  HgbA1c5.6  VTE prophylaxis - lovenox  No antithromboticprior to admission. On ASA 81mg  daily and Brilinta 90 mg BID post stenting for 3 months  Therapy recommendations: CIR  Dispo: CIR today3/22  Today, 12/20/2020, Brittany Griffith is being seen for hospital follow-up accompanied by her brother who assists with interpretation who has been with her for the past 5 days. Husband is waiting in the lobby  She was discharged home from CIR on 4/12 after a 68-day stay  Currently working with neuro rehab therapy with continued improvement of right-sided weakness, aphasia, dysphagia and cognitive impairment.  Currently ambulating without assistive device and denies any  recent falls.  Brother reports being on modified diet without difficulty but unable to state what level -recently seen in the ED with suspected aspiration pneumonia 5/2 currently on clindamycin.  Denies new or worsening stroke/TIA symptoms  Remains on Keppra 500 mg twice daily tolerating without side effects and denies any seizure activity Remains on aspirin and Brilinta with mild bruising but no bleeding Remains on atorvastatin without associated side effects Blood pressure today 121/75 -  not routinely monitored at home  She has not yet had follow-up with Dr. Estanislado Pandy Has had follow-up with neurosurgery Dr. Zada Finders 4/28 but unable to view Imp/plan part of note -brother is not sure of further recommendations or recommended follow-up  Brother asks multiple questions in regards to current medications and ongoing use as well as currently participating in therapies and different exercises to do at home No further concerns at this time    ROS:   N/A d/t language difficulty  PMH:  Past Medical History:  Diagnosis Date  . Hypercholesteremia   . Hypertension   . Osteopenia   . Stroke [facility])     PSH:  Past Surgical History:  Procedure Laterality Date  . IR ANGIO INTRA EXTRACRAN SEL COM CAROTID INNOMINATE UNI R MOD SED  10/26/2020  . IR INTRA CRAN STENT  10/26/2020  . IR RADIOLOGIST EVAL & MGMT  12/09/2020  . RADIOLOGY WITH ANESTHESIA Left 10/26/2020   Procedure: RADIOLOGY WITH ANESTHESIA LEFT MCA STENT;  Surgeon: Luanne Bras, MD;  Location: [facility];  Service: Radiology;  Laterality: Left;    Social History:  Social History   Socioeconomic History  . Marital status: Married    Spouse name: Not on file  . Number of children: Not on file  . Years of education: Not on file  . Highest education level: Not on file  Occupational History  . Not on file  Tobacco Use  . Smoking status: Never Smoker  . Smokeless tobacco: Never Used  Substance and Sexual Activity  . Alcohol use:  Not Currently  . Drug use: Not Currently  . Sexual activity: Not on file  Other Topics Concern  . Not on file  Social History Narrative  . Not on file   Social Determinants of Health   Financial Resource Strain: Not on file  Food Insecurity: Not on file  Transportation Needs: Not on file  Physical Activity: Not on file  Stress: Not on file  Social Connections: Not on file  Intimate Partner Violence: Not on file    Family History:  Family History  Problem Relation Age of Onset  . Stroke Father   . Heart attack Father   . Kidney disease Brother     Medications:   Current Outpatient Medications on File Prior to Visit  Medication Sig Dispense Refill  . acetaminophen (TYLENOL) 325 MG tablet Take 2 tablets (650 mg total) by mouth every 4 (four) hours as needed for mild pain (or temp > 37.5 C (99.5 F)).    Marland Kitchen amLODipine (NORVASC) 5 MG tablet Take 1 tablet (5 mg total) by mouth daily. 30 tablet 0  . aspirin 81 MG chewable tablet Chew 1 tablet (81 mg total) by mouth daily. 30 tablet 0  . atorvastatin (LIPITOR) 80 MG tablet Take 1 tablet (  80 mg total) by mouth daily. 30 tablet 0  . clindamycin (CLEOCIN) 300 MG capsule Take 1 capsule (300 mg total) by mouth 3 (three) times daily. 21 capsule 0  . guaifenesin (ROBITUSSIN) 100 MG/5ML syrup Take 5-10 mLs (100-200 mg total) by mouth every 4 (four) hours as needed for cough. 60 mL 0  . levETIRAcetam (KEPPRA) 100 MG/ML solution Take 5 mLs (500 mg total) by mouth 2 (two) times daily. 473 mL 12  . losartan (COZAAR) 50 MG tablet Take 0.5 tablets (25 mg total) by mouth 2 (two) times daily. 60 tablet 0  . Multiple Vitamins-Minerals (MULTIVITAMIN WITH MINERALS) tablet Take 1 tablet by mouth daily.    . polyethylene glycol (MIRALAX / GLYCOLAX) 17 g packet Place 17 g into feeding tube 2 (two) times daily. 14 each 0  . potassium chloride SA (KLOR-CON) 20 MEQ tablet Take 1 tablet (20 mEq total) by mouth daily. 30 tablet 0  . ticagrelor (BRILINTA) 90 MG  TABS tablet Take 1 tablet (90 mg total) by mouth 2 (two) times daily. 60 tablet 0   No current facility-administered medications on file prior to visit.    Allergies:   Allergies  Allergen Reactions  . Penicillins     unk reaction      OBJECTIVE:  Physical Exam  Vitals:   12/20/20 1407  BP: 121/75  Pulse: 80  Weight: 127 lb (57.6 kg)  Height: 5' (1.524 m)   Body mass index is 24.8 kg/m. No exam data present  Post stroke PHQ 2/9 Depression screen PHQ 2/9 12/20/2020  Decreased Interest 0  Down, Depressed, Hopeless 0  PHQ - 2 Score 0    General: well developed, well nourished,  pleasant middle-age Caucasian female, seated, in no evident distress Head: head normocephalic and atraumatic.   Neck: supple with no carotid or supraclavicular bruits Cardiovascular: regular rate and rhythm, no murmurs Musculoskeletal: no deformity Skin:  no rash/petichiae Vascular:  Normal pulses all extremities   Neurologic Exam Mental Status: Awake and fully alert. Severe expressive aphasia - unable to appreciate receptive aphasia. Primarily [place] speaking but does understand english.  Able to nod yes/no.  Difficulty assessing cognition due to language impairment.  Able to follow simple step commands.  Mood and affect appropriate.  Cranial Nerves: Fundoscopic exam reveals sharp disc margins. Pupils equal, briskly reactive to light. Extraocular movements full without nystagmus. Visual fields blinks to threat in all 4 quadrants. Hearing intact. Facial sensation intact.  Right lower facial weakness.  Tongue and palate moves normally and symmetrically.  Motor:  RUE 1/5 proximal and 0/5 distal RLE: 3-4/5 LUE: 4+-5/5 LLE: 4+-5/5 Sensory.: intact to touch , pinprick , position and vibratory sensation.  Coordination: Rapid alternating movements normal in all extremities on left side. Finger-to-nose and heel-to-shin performed accurately on left side. Gait and Station: Arises from chair without  difficulty. Stance is normal. Gait demonstrates  hemiplegic gait with unsteadiness and without use of assistive device.  Tandem walk and heel toe not attempted. Reflexes: 1+ and symmetric. Toes downgoing.     NIHSS  7 Modified Rankin  3      ASSESSMENT: Brittany Griffith is a 70 y.o. year old female presented on 10/23/2020 with left MCA infarct due to L M1 stenosis with worsening on 3/14 with progressive L MCA territory infarct s/p L M1 stenting on 3/16 with further progression and cytotoxic edema 3/20 possibly due to stent reocclusion. 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  Vascular risk factors include  HTN, HLD, multiple left hemisphere meningioma and advanced age .      PLAN:  1. L MCA stroke s/p stent :  a. Residual deficit: Severe aphasia, dysphagia and right-sided weakness.  Continue working with neuro rehab therapies for hopeful ongoing recovery.  Advised brother to discuss home exercises with therapist b. Continue aspirin 81 mg daily and Brilinta (ticagrelor) 90 mg bid  and atorvastatin for secondary stroke prevention.  Continue Brilinta for total of 3 months then discontinue.  Atorvastatin refill provided per request -advised further prescribing by PCP c. Provided brother with Dr. Arlean Hopping office contact information to schedule follow-up visit d. Discussed secondary stroke prevention measures and importance of close PCP follow up for aggressive stroke risk factor management  2. Seizure in setting of stroke: Continue Keppra 500 mg twice daily for seizure prophylaxis 3. HTN: BP goal <130/90.  Stable on current regimen per PCP 4. HLD: LDL goal <70. Recent LDL 159 - started on atorvastatin 80 mg daily.  Request follow-up with PCP in the next 1 to 2 months for repeat lipid panel and ongoing prescribing 5. Meningioma: Followed by neurosurgery    Per brother request, follow up in 3 months with Dr. Leonie Man or call earlier if needed   CC:  [place] provider: Dr. Leonie Man PCP:  [facility]    I spent 48 minutes of face-to-face and non-face-to-face time with patient and brother.  This included previsit chart review including extensive review of recent hospitalization pertinent progress notes, lab work and imaging, prolonged discussion regarding recent stroke and etiologies, residual deficits and typical recovery time, secondary stroke prevention measures and routine follow-up with PCP for aggressive stroke risk factor management, use of AED for seizure prophylaxis, meningiomas and routine follow-up with neurosurgery and answered all other questions to patient and grandmother satisfaction  Frann Rider, AGNP-BC  [facility] [address] [place], [place] [address]  Phone [phone] Fax [phone] Note: This document was prepared with digital dictation and possible smart phrase technology. Any transcriptional errors that result from this process are unintentional.

## 2020-12-21 ENCOUNTER — Ambulatory Visit: Payer: Medicare HMO

## 2020-12-21 ENCOUNTER — Ambulatory Visit: Payer: Medicare HMO | Admitting: Occupational Therapy

## 2020-12-21 ENCOUNTER — Other Ambulatory Visit: Payer: Self-pay

## 2020-12-21 DIAGNOSIS — I69351 Hemiplegia and hemiparesis following cerebral infarction affecting right dominant side: Secondary | ICD-10-CM

## 2020-12-21 DIAGNOSIS — R2689 Other abnormalities of gait and mobility: Secondary | ICD-10-CM

## 2020-12-21 DIAGNOSIS — M6281 Muscle weakness (generalized): Secondary | ICD-10-CM

## 2020-12-21 DIAGNOSIS — R208 Other disturbances of skin sensation: Secondary | ICD-10-CM

## 2020-12-21 DIAGNOSIS — R2681 Unsteadiness on feet: Secondary | ICD-10-CM | POA: Diagnosis not present

## 2020-12-21 NOTE — Therapy (Signed)
[facility] [address] [place], [place], [address] Phone: [phone]   Fax:  [phone]  Physical Therapy Treatment  Patient Details  Name: Brittany Griffith MRN: [id number] Date of Birth: Dec 17, 1950 Referring Provider (PT): Lauraine Rinne, [place]   Encounter Date: 12/21/2020   PT End of Session - 12/21/20 1311    Visit Number 5    Number of Visits 21    Date for PT Re-Evaluation 02/07/21    Authorization Type [employer]; $30 copay per day for all disciplines, Eval /19/22,    Progress Note Due on Visit 10    PT Start Time 1145    PT Stop Time 1230    PT Time Calculation (min) 45 min    Equipment Utilized During Treatment Gait belt    Activity Tolerance Patient tolerated treatment well    Behavior During Therapy Flat affect           Past Medical History:  Diagnosis Date  . Hypercholesteremia   . Hypertension   . Osteopenia   . Stroke [facility])     Past Surgical History:  Procedure Laterality Date  . IR ANGIO INTRA EXTRACRAN SEL COM CAROTID INNOMINATE UNI R MOD SED  10/26/2020  . IR INTRA CRAN STENT  10/26/2020  . IR RADIOLOGIST EVAL & MGMT  12/09/2020  . RADIOLOGY WITH ANESTHESIA Left 10/26/2020   Procedure: RADIOLOGY WITH ANESTHESIA LEFT MCA STENT;  Surgeon: Luanne Bras, MD;  Location: [facility];  Service: Radiology;  Laterality: Left;    There were no vitals filed for this visit.   Subjective Assessment - 12/21/20 1152    Subjective no falls or LOB reported by family    Patient is accompained by: Family member    Limitations Sitting;Lifting;Standing;Walking;House hold activities    How long can you sit comfortably? no issue    How long can you stand comfortably? She can't stand    How long can you walk comfortably? She can walk with family with 25% assistance with waling for 5-10 fet at home                             [facility] Adult PT Treatment/Exercise - 12/21/20 0001       Transfers   Transfers Sit to Stand    Sit to Stand 4: Min guard;5: Supervision    Stand to Sit 4: Min guard;5: Supervision    Comments performed from [employer]      Ambulation/Gait   Ambulation/Gait Yes    Ambulation/Gait Assistance 4: Min guard    Ambulation Distance (Feet) 115 Feet    Assistive device Small based quad cane    Gait Pattern Step-through pattern    Ambulation Surface Level;Indoor      Knee/Hip Exercises: Aerobic   Other Aerobic Scifit 8' L1 no UEs               Balance Exercises - 12/21/20 0001      Balance Exercises: Standing   SLS with Vectors Solid surface;Upper extremity assist 1;Limitations    SLS with Vectors Limitations performed cone taps 10x per LE    Step Ups Forward;Lateral;4 inch;Intermittent UE support;Limitations    Step Ups Limitations 10x ea. VCs needed for proper foot placement    Tandem Gait Forward;Intermittent upper extremity support;5 reps;Limitations    Tandem Gait Limitations 5 trips in // bars across blue mat    Retro Gait Foam/compliant surface;5 reps;Limitations  Retro Gait Limitations 5 trips in //s across blue mat    Sidestepping 5 reps;Limitations    Sidestepping Limitations 5 trips in // bars no UE suppotr    Other Standing Exercises Runner step from 4" block, 10x ea. target provided for flexion height               PT Short Term Goals - 11/29/20 1429      PT SHORT TERM GOAL #1   Title pt will be able to ambulate 300' with quad cane with CGA and without evidence of LOB to improve short distance ambulation.    Baseline 115' quad cane CGA-MinA for occaisonal LOB (11/29/20)    Time 5    Period Weeks    Status New    Target Date 01/03/21      PT SHORT TERM GOAL #2   Title Pt will be able to go up and down12 steps with use of 1 rail and CGA to improve stair negotiation in community    Baseline not tried (11/29/20)    Time 5    Period Weeks    Status New    Target Date 01/03/21      PT SHORT TERM GOAL #3   Title Pt  and family will demo compliance with at least 5 days a week walking program to improve walking endurance and practice gait.    Baseline Household ambulator only (11/29/20)    Time 5    Period Weeks    Status New    Target Date 01/03/21             PT Long Term Goals - 11/29/20 1433      PT LONG TERM GOAL #1   Title Pt will be able to ambulate on non compliant surface for 200 feet with appropriate AD with SBA to improve community negotiation.    Baseline not attempted (eval)    Time 10    Period Weeks    Status New    Target Date 02/07/21      PT LONG TERM GOAL #2   Title Pt will be able to ambulate 1050' with appropriate AD with SBA to improve community ambulation    Baseline 115' with quad cane CGA to min A (11/29/20)    Time 10    Period Weeks    Status New    Target Date 02/07/21      PT LONG TERM GOAL #3   Title Pt will demo 0.57m/s improvement on 10 meter walk test to improve walking speed and improve community ambulation    Baseline Not assessed on eval (11/29/20)    Time 10    Period Weeks    Status New    Target Date 02/07/21      PT LONG TERM GOAL #4   Title Pt will demo 4 points improvement on BBS to reduce fall risk    Baseline not assessed on eval (11/29/20)    Time 10    Period Weeks    Status New    Target Date 02/07/21                 Plan - 12/21/20 1311    Clinical Impression Statement Todays skilled session focused on gait and strength training, less difficulty observed in following commands, brrother present to translate and encourage, promoted stepping tasks, gait on compliant surfaces, VCs and TCs    Personal Factors and Comorbidities Age;Behavior Pattern;Past/Current Experience;Time since onset of injury/illness/exacerbation;Transportation  Examination-Activity Limitations Bathing;Bed Mobility;Bend;Caring for Others;Carry;Dressing;Hygiene/Grooming;Lift;Sleep;Squat;Stairs;Stand;Toileting;Transfers    Examination-Participation Restrictions  Cleaning;Community Activity;Driving;Laundry;Medication Management;Meal Prep;Personal Finances;Shop;Yard Work    [employer] Evolving/Moderate complexity    Rehab Potential Good    PT Frequency 2x / week    PT Duration Other (comment)   10 weeks   PT Treatment/Interventions ADLs/Self Care Home Management;Moist Heat;Gait training;Stair training;Functional mobility training;Therapeutic activities;Therapeutic exercise;Balance training;Neuromuscular re-education;Cognitive remediation;Patient/family education;Orthotic Fit/Training;Manual techniques;Passive range of motion;Energy conservation;Visual/perceptual remediation/compensation;Joint Manipulations    PT Next Visit Plan Continue gait training and balance tasks, SLS tasks to improve balance    PT Home Exercise Plan Access Code [email or web address]     Consulted and Agree with Plan of Care Patient;Family member/caregiver           Patient will benefit from skilled therapeutic intervention in order to improve the following deficits and impairments:  Abnormal gait,Decreased activity tolerance,Decreased cognition,Decreased balance,Decreased coordination,Decreased safety awareness,Decreased range of motion,Decreased mobility,Decreased knowledge of use of DME,Decreased knowledge of precautions,Decreased endurance,Decreased strength,Difficulty walking,Impaired flexibility,Impaired perceived functional ability,Increased fascial restricitons,Impaired sensation,Impaired tone,Impaired UE functional use,Postural dysfunction,Pain  Visit Diagnosis: Unsteadiness on feet  Muscle weakness (generalized)  Other abnormalities of gait and mobility  Hemiplegia and hemiparesis following cerebral infarction affecting right dominant side [facility])     Problem List Patient Active Problem List   Diagnosis Date Noted  . Right hemiplegia ([place])   . Urinary retention   . Hyperglycemia   . Essential hypertension   . Dysphagia, post-stroke   . 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 Dysphagia 11/01/2020  . Middle cerebral artery stenosis, left 10/26/2020  . Acute CVA (cerebrovascular accident) ([place]) 10/24/2020  . left MCA stroke 10/23/2020  . Meningioma ([place]) 10/23/2020  . Cerebral edema ([place]) 10/23/2020  . Global aphasia 10/23/2020  . Hypertensive urgency 10/23/2020    [name] PT 12/21/2020, 2:37 PM  [facility] [address] [place], [place], [address] Phone: [phone]   Fax:  [phone]  Name: Paitlyn Mcclatchey MRN: [id number] Date of Birth: 16-Jul-1951

## 2020-12-21 NOTE — Therapy (Signed)
[facility] [address] [place], [place], [address] Phone: [phone]   Fax:  [phone]  Occupational Therapy Treatment  Patient Details  Name: Brittany Griffith MRN: [id number] Date of Birth: Jan 09, 1951 No data recorded  Encounter Date: 12/21/2020   OT End of Session - 12/21/20 1346    Visit Number 3    Number of Visits 25    Date for OT Re-Evaluation 03/06/21    Authorization Type [employer] MCR    Authorization - Visit Number 3    Authorization - Number of Visits 10    Progress Note Due on Visit 10    OT Start Time 1230    OT Stop Time 1315    OT Time Calculation (min) 45 min    Activity Tolerance Patient tolerated treatment well    Behavior During Therapy [facility] for tasks assessed/performed           Past Medical History:  Diagnosis Date  . Hypercholesteremia   . Hypertension   . Osteopenia   . Stroke [facility])     Past Surgical History:  Procedure Laterality Date  . IR ANGIO INTRA EXTRACRAN SEL COM CAROTID INNOMINATE UNI R MOD SED  10/26/2020  . IR INTRA CRAN STENT  10/26/2020  . IR RADIOLOGIST EVAL & MGMT  12/09/2020  . RADIOLOGY WITH ANESTHESIA Left 10/26/2020   Procedure: RADIOLOGY WITH ANESTHESIA LEFT MCA STENT;  Surgeon: Luanne Bras, MD;  Location: [facility];  Service: Radiology;  Laterality: Left;    There were no vitals filed for this visit.   Subjective Assessment - 12/21/20 1235    Subjective  Pt presents today with brother and husband.    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 PMH: HTN, HLD, Meningioma    Limitations fall risk, aphasic,    Currently in Pain? Yes    Pain Score --   cannot verbalize but answered yes to a little bit   Pain Location Arm    Pain Orientation Right    Pain Onset More than a month ago          Reviewed previously issued ex. Pt also shown self stretch in sh  flexion while supine - pt's brother said she was already doing this at home. Reviewed proper positioning and hand placement for this.  Pt also shown table slides she can do at home, and AA/ROM using quad cane w/ assist to hold hand on cane handle.  Closed chain AA/ROM using tilted stool with mod facilitation - pt with increased activation with repetition. Closed chain "pushing" into therapist shoulder BUE's w/ assist to maintain RUE position and hand on shoulder. Wt bearing over RUE w/ min assist and support to wrist while performing contralateral reaching LUE w/ trunk rotation.  Supine: elbow extension against gravity w/ max assist initially but only min assist required after repetition. Modified chest press motion w/ mod assist starting at 1/2 range w/ arm supported.                         OT Short Term Goals - 12/16/20 1442      OT SHORT TERM GOAL #1   Title Pt/family Independent with initial HEP    Time 4    Period Weeks    Status On-going      OT SHORT TERM GOAL #2   Title  Pt/family independent with splint wear and care prn    Time 4    Period Weeks    Status New      OT SHORT TERM GOAL #3   Title Pt to cut food w/ AE prn    Time 4    Period Weeks    Status New      OT SHORT TERM GOAL #4   Title Pt/family to id A/E and one handed techniques to assist with BADLs (dressing, bathing, grooming, toileting)    Time 4    Period Weeks    Status New      OT SHORT TERM GOAL #5   Title Pt to demo 25* Rt shoulder flexion in prep for low level reaching    Time 4    Period Weeks    Status New      OT SHORT TERM GOAL #6   Title Pt to demo 25% finger flexion and 10% finger extension in prep for grasping/releasing Rt hand    Time 4    Period Weeks    Status New             OT Long Term Goals - 12/07/20 1112      OT LONG TERM GOAL #1   Title Pt to be independent with updated HEP    Time 12    Period Weeks    Status New      OT LONG TERM GOAL #2    Title Pt to be mod I with all BADLS using A/E prn (grooming, dressing, bathing, and toileting)    Time 12    Period Weeks    Status New      OT LONG TERM GOAL #3   Title Pt to prepare simple snack, sandwich, and/or microwaveable items mod I level    Time 12    Period Weeks    Status New      OT LONG TERM GOAL #4   Title Pt to perform RUE reaching in low ranges to grasp and release 1-2" objects consistently    Time 12    Period Weeks    Status New      OT LONG TERM GOAL #5   Title Pt to use RUE as min assist for BADLS and bilateral tasks    Time 12    Period Weeks    Status New      Long Term Additional Goals   Additional Long Term Goals Yes      OT LONG TERM GOAL #6   Title Pt to improve RUE function as evidenced by performing 6 blocks on Box & Blocks test    Baseline unable, no functional movement    Time 12    Period Weeks    Status New                 Plan - 12/21/20 1347    Clinical Impression Statement Pt with good activation of RUE in closed chain activities and low range AA/ROM (closed chain). Pt appears motivated and good family support    OT Occupational Profile and History Detailed Assessment- Review of Records and additional review of physical, cognitive, psychosocial history related to current functional performance    Occupational performance deficits (Please refer to evaluation for details): ADL's;IADL's;Social Participation;Work;Leisure    Body Structure / Function / Physical Skills ADL;Strength;Pain;Body mechanics;Edema;Proprioception;UE functional use;IADL;ROM;Coordination;Mobility;Sensation;FMC    Cognitive Skills Attention;Understand;Perception    Rehab Potential Good    Clinical Decision Making Several treatment  options, min-mod task modification necessary    Comorbidities Affecting Occupational Performance: May have comorbidities impacting occupational performance    Modification or Assistance to Complete Evaluation  Min-Moderate modification of  tasks or assist with assess necessary to complete eval    OT Frequency 2x / week    OT Duration 12 weeks   plus eval   OT Treatment/Interventions Self-care/ADL training;Moist Heat;DME and/or AE instruction;Splinting;Therapeutic activities;Therapeutic exercise;Cognitive remediation/compensation;Coping strategies training;Neuromuscular education;Functional Mobility Training;Passive range of motion;Visual/perceptual remediation/compensation;Patient/family education;Manual Therapy;Electrical Stimulation;Energy conservation    Plan continue HEP, weight bearing, NMR RUE, initiate ADLs hemi techniques    Consulted and Agree with Plan of Care Patient;Family member/caregiver    Family Member Consulted Husband and son           Patient will benefit from skilled therapeutic intervention in order to improve the following deficits and impairments:   Body Structure / Function / Physical Skills: ADL,Strength,Pain,Body mechanics,Edema,Proprioception,UE functional use,IADL,ROM,Coordination,Mobility,Sensation,FMC Cognitive Skills: Attention,Understand,Perception     Visit Diagnosis: Hemiplegia and hemiparesis following cerebral infarction affecting right dominant side ([place])  Other disturbances of skin sensation    Problem List Patient Active Problem List   Diagnosis Date Noted  . Right hemiplegia ([place])   . Urinary retention   . Hyperglycemia   . Essential hypertension   . Dysphagia, post-stroke   . 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 Dysphagia 11/01/2020  . Middle cerebral artery stenosis, left 10/26/2020  . Acute CVA (cerebrovascular accident) ([place]) 10/24/2020  . left MCA stroke 10/23/2020  . Meningioma ([place]) 10/23/2020  . Cerebral edema ([place]) 10/23/2020  . Global aphasia 10/23/2020  . Hypertensive urgency 10/23/2020    Carey Bullocks, OTR/L 12/21/2020, 1:51 PM  [facility] [address] [place], [place], [address] Phone: [phone]   Fax:  [phone]  Name: Shaunie Boehm MRN: [id number] Date of Birth: [birth year]

## 2020-12-22 ENCOUNTER — Other Ambulatory Visit: Payer: Self-pay | Admitting: Student

## 2020-12-22 DIAGNOSIS — I63412 Cerebral infarction due to embolism of left middle cerebral artery: Secondary | ICD-10-CM

## 2020-12-22 MED ORDER — TICAGRELOR 90 MG PO TABS
45.0000 mg | ORAL_TABLET | Freq: Two times a day (BID) | ORAL | 0 refills | Status: DC
Start: 2020-12-22 — End: 2020-12-23

## 2020-12-22 MED ORDER — TICAGRELOR 90 MG PO TABS: 45.0000 mg | ORAL_TABLET | Freq: Two times a day (BID) | ORAL | 0 refills | Status: DC

## 2020-12-22 NOTE — Progress Notes (Signed)
I agree with the above plan 

## 2020-12-23 ENCOUNTER — Ambulatory Visit: Payer: Medicare HMO

## 2020-12-23 ENCOUNTER — Encounter: Payer: Self-pay | Admitting: Occupational Therapy

## 2020-12-23 ENCOUNTER — Other Ambulatory Visit: Payer: Self-pay

## 2020-12-23 ENCOUNTER — Ambulatory Visit: Payer: Medicare HMO | Admitting: Occupational Therapy

## 2020-12-23 ENCOUNTER — Other Ambulatory Visit (HOSPITAL_COMMUNITY): Payer: Self-pay | Admitting: Student

## 2020-12-23 DIAGNOSIS — M6281 Muscle weakness (generalized): Secondary | ICD-10-CM

## 2020-12-23 DIAGNOSIS — R2681 Unsteadiness on feet: Secondary | ICD-10-CM

## 2020-12-23 DIAGNOSIS — R2689 Other abnormalities of gait and mobility: Secondary | ICD-10-CM

## 2020-12-23 DIAGNOSIS — R482 Apraxia: Secondary | ICD-10-CM

## 2020-12-23 DIAGNOSIS — R41841 Cognitive communication deficit: Secondary | ICD-10-CM

## 2020-12-23 DIAGNOSIS — R1312 Dysphagia, oropharyngeal phase: Secondary | ICD-10-CM

## 2020-12-23 DIAGNOSIS — I69318 Other symptoms and signs involving cognitive functions following cerebral infarction: Secondary | ICD-10-CM

## 2020-12-23 DIAGNOSIS — R208 Other disturbances of skin sensation: Secondary | ICD-10-CM

## 2020-12-23 DIAGNOSIS — R4701 Aphasia: Secondary | ICD-10-CM

## 2020-12-23 DIAGNOSIS — I69351 Hemiplegia and hemiparesis following cerebral infarction affecting right dominant side: Secondary | ICD-10-CM

## 2020-12-23 DIAGNOSIS — I63412 Cerebral infarction due to embolism of left middle cerebral artery: Secondary | ICD-10-CM

## 2020-12-23 MED ORDER — TICAGRELOR 90 MG PO TABS: 45.0000 mg | ORAL_TABLET | Freq: Two times a day (BID) | ORAL | 0 refills | Status: DC

## 2020-12-23 NOTE — Therapy (Signed)
[name] [address] [place], [place], [address] Phone: [phone]   Fax:  [phone]  Occupational Therapy Treatment  Patient Details  Name: Brittany Griffith MRN: [id number] Date of Birth: [birth year] No data recorded  Encounter Date: 12/23/2020   OT End of Session - 12/23/20 1453    Visit Number 4    Number of Visits 25    Date for OT Re-Evaluation 03/06/21    Authorization Type [employer] MCR    Authorization - Visit Number 4    Authorization - Number of Visits 10    Progress Note Due on Visit 10    OT Start Time 1452    OT Stop Time 1530    OT Time Calculation (min) 38 min    Activity Tolerance Patient tolerated treatment well    Behavior During Therapy [facility] for tasks assessed/performed           Past Medical History:  Diagnosis Date  . Hypercholesteremia   . Hypertension   . Osteopenia   . Stroke [facility])     Past Surgical History:  Procedure Laterality Date  . IR ANGIO INTRA EXTRACRAN SEL COM CAROTID INNOMINATE UNI R MOD SED  10/26/2020  . IR INTRA CRAN STENT  10/26/2020  . IR RADIOLOGIST EVAL & MGMT  12/09/2020  . RADIOLOGY WITH ANESTHESIA Left 10/26/2020   Procedure: RADIOLOGY WITH ANESTHESIA LEFT MCA STENT;  Surgeon: Luanne Bras, MD;  Location: [facility];  Service: Radiology;  Laterality: Left;    There were no vitals filed for this visit.   Subjective Assessment - 12/23/20 1454    Subjective  Pt reports pain in RUE (a little bit) and difficulty with sleeping last night bc of pain.    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 PMH: HTN, HLD, Meningioma    Limitations fall risk, aphasic,    Currently in Pain? Yes    Pain Score 2    "a little bit" but unable to give number   Pain Location Arm    Pain Orientation Right    Pain Descriptors / Indicators Aching    Pain Type Acute pain    Pain Onset  More than a month ago    Pain Frequency Intermittent           Reviewed sleep positions for patient. Demonstrated and pt and family present and verbalized understanding.  [employer] pt and family asking for sling options for increased pain in RUE. Gave information re: [employer] on [employer].  Weight bearing in RUE for inhibition of tone and facilitation of muscle activation while lateral lean into forearm x 10 reps at 4" elevation and 10 reps at 2" elevation on boards with mod A for support. Good tricep activation and facilitation in RUE during movements.   UE Ranger  While seated and again in sidelying on left side in gravity eliminated position for neuromuscular reeducation. Pt with active shoulder flexion, scap retraction and protraction and elbow flexion/extension but requires support at all movements d/t weakness and control.                    OT Short Term Goals - 12/16/20 1442      OT SHORT TERM GOAL #1   Title Pt/family Independent with initial HEP    Time 4    Period Weeks    Status On-going  OT SHORT TERM GOAL #2   Title Pt/family independent with splint wear and care prn    Time 4    Period Weeks    Status New      OT SHORT TERM GOAL #3   Title Pt to cut food w/ AE prn    Time 4    Period Weeks    Status New      OT SHORT TERM GOAL #4   Title Pt/family to id A/E and one handed techniques to assist with BADLs (dressing, bathing, grooming, toileting)    Time 4    Period Weeks    Status New      OT SHORT TERM GOAL #5   Title Pt to demo 25* Rt shoulder flexion in prep for low level reaching    Time 4    Period Weeks    Status New      OT SHORT TERM GOAL #6   Title Pt to demo 25% finger flexion and 10% finger extension in prep for grasping/releasing Rt hand    Time 4    Period Weeks    Status New             OT Long Term Goals - 12/07/20 1112      OT LONG TERM GOAL #1   Title Pt to be independent with updated HEP    Time 12    Period  Weeks    Status New      OT LONG TERM GOAL #2   Title Pt to be mod I with all BADLS using A/E prn (grooming, dressing, bathing, and toileting)    Time 12    Period Weeks    Status New      OT LONG TERM GOAL #3   Title Pt to prepare simple snack, sandwich, and/or microwaveable items mod I level    Time 12    Period Weeks    Status New      OT LONG TERM GOAL #4   Title Pt to perform RUE reaching in low ranges to grasp and release 1-2" objects consistently    Time 12    Period Weeks    Status New      OT LONG TERM GOAL #5   Title Pt to use RUE as min assist for BADLS and bilateral tasks    Time 12    Period Weeks    Status New      Long Term Additional Goals   Additional Long Term Goals Yes      OT LONG TERM GOAL #6   Title Pt to improve RUE function as evidenced by performing 6 blocks on Box & Blocks test    Baseline unable, no functional movement    Time 12    Period Weeks    Status New                 Plan - 12/23/20 1530    Clinical Impression Statement Pt progressing towards goals. Continuing NMR for RUE. Pt with increased pain in RUE shoulder today.    OT Occupational Profile and History Detailed Assessment- Review of Records and additional review of physical, cognitive, psychosocial history related to current functional performance    Occupational performance deficits (Please refer to evaluation for details): ADL's;IADL's;Social Participation;Work;Leisure    Body Structure / Function / Physical Skills ADL;Strength;Pain;Body mechanics;Edema;Proprioception;UE functional use;IADL;ROM;Coordination;Mobility;Sensation;FMC    Cognitive Skills Attention;Understand;Perception    Rehab Potential Good    Clinical Decision Making Several  treatment options, min-mod task modification necessary    Comorbidities Affecting Occupational Performance: May have comorbidities impacting occupational performance    Modification or Assistance to Complete Evaluation  Min-Moderate  modification of tasks or assist with assess necessary to complete eval    OT Frequency 2x / week    OT Duration 12 weeks   plus eval   OT Treatment/Interventions Self-care/ADL training;Moist Heat;DME and/or AE instruction;Splinting;Therapeutic activities;Therapeutic exercise;Cognitive remediation/compensation;Coping strategies training;Neuromuscular education;Functional Mobility Training;Passive range of motion;Visual/perceptual remediation/compensation;Patient/family education;Manual Therapy;Electrical Stimulation;Energy conservation    Plan continue HEP, weight bearing, NMR RUE, initiate ADLs hemi techniques    Consulted and Agree with Plan of Care Patient;Family member/caregiver    Family Member Consulted Husband and son           Patient will benefit from skilled therapeutic intervention in order to improve the following deficits and impairments:   Body Structure / Function / Physical Skills: ADL,Strength,Pain,Body mechanics,Edema,Proprioception,UE functional use,IADL,ROM,Coordination,Mobility,Sensation,FMC Cognitive Skills: Attention,Understand,Perception     Visit Diagnosis: Unsteadiness on feet  Muscle weakness (generalized)  Other abnormalities of gait and mobility  Hemiplegia and hemiparesis following cerebral infarction affecting right dominant side (HCC)  Other disturbances of skin sensation  Other symptoms and signs involving cognitive functions following cerebral infarction    Problem List Patient Active Problem List   Diagnosis Date Noted  . Right hemiplegia ([place])   . Urinary retention   . Hyperglycemia   . Essential hypertension   . Dysphagia, post-stroke   . 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 Dysphagia 11/01/2020  . Middle cerebral artery stenosis, left 10/26/2020  . Acute CVA (cerebrovascular accident) ([place]) 10/24/2020  . left MCA stroke 10/23/2020  .  Meningioma ([place]) 10/23/2020  . Cerebral edema ([place]) 10/23/2020  . Global aphasia 10/23/2020  . Hypertensive urgency 10/23/2020    Zachery Conch MOT, OTR/L  12/23/2020, 3:31 PM  [facility] [address] [place], [place], [address] Phone: [phone]   Fax:  [phone]  Name: Brittany Griffith MRN: [id number] Date of Birth: 01-08-1951

## 2020-12-23 NOTE — Patient Instructions (Signed)
See note from today

## 2020-12-23 NOTE — Therapy (Signed)
[facility] [address] [place], [place], [address] Phone: [phone]   Fax:  [phone]  Speech Language Pathology Treatment  Patient Details  Name: Brittany Griffith MRN: [id number] Date of Birth: 10-20-1950 Referring Provider (SLP): Cathlyn Parsons, PA-C   Encounter Date: 12/23/2020   End of Session - 12/23/20 1512    Visit Number 5    Number of Visits 17    Date for SLP Re-Evaluation 02/27/21    Authorization Type Aetna Medicare    SLP Start Time 1405    SLP Stop Time  1447    SLP Time Calculation (min) 42 min    Activity Tolerance Patient tolerated treatment well           Past Medical History:  Diagnosis Date  . Hypercholesteremia   . Hypertension   . Osteopenia   . Stroke [facility])     Past Surgical History:  Procedure Laterality Date  . IR ANGIO INTRA EXTRACRAN SEL COM CAROTID INNOMINATE UNI R MOD SED  10/26/2020  . IR INTRA CRAN STENT  10/26/2020  . IR RADIOLOGIST EVAL & MGMT  12/09/2020  . RADIOLOGY WITH ANESTHESIA Left 10/26/2020   Procedure: RADIOLOGY WITH ANESTHESIA LEFT MCA STENT;  Surgeon: Luanne Bras, MD;  Location: [facility];  Service: Radiology;  Laterality: Left;    There were no vitals filed for this visit.   Subjective Assessment - 12/23/20 1413    Subjective Have not tried any of the words with pt SLP provided previous session.    Patient is accompained by: --   husband and brother   Currently in Pain? Yes    Pain Score --   unknown   Pain Location Arm    Pain Orientation Right    Pain Descriptors / Indicators Aching                 ADULT SLP TREATMENT - 12/23/20 1416      General Information   Behavior/Cognition Alert;Pleasant mood;Requires cueing;Doesn't follow directions;Lethargic      Treatment Provided   Treatment provided Cognitive-Linquistic      Cognitive-Linquistic Treatment   Treatment focused on Apraxia;Patient/family/caregiver education    Skilled  Orthoptist throughout session by SLP about how to perform today's therapy tasks at home, with brother and husband. Pt ID'd common items 85% success and those she missed in English she was successful with in her first language to improve success to 100%. Pt imitated 70% /m/ phoneme and 80% /a/ phoneme dichotomy with max A (visual, model, verbal, with hand signals). Imitation without hand gestures /m/ and /a/ dichotomy 75% /m/ and 88% /a/. With written "ah" and "m" on sticky notes, 50% success and 60% respectively. After 15 minutes pt perseverated on /ma/, so SLP switched to "ee" with written "eee" and imitation with gesture 85%, and with just the written cue "eee" 100% (5/5). SLP switched dichotomy between "eee" and "ah" and  using written cues pt with 75% "ee" and 25% "ah" with perseveration beginning on /ma/ in < 5 minutes. At end of session brother asked for materials to assist pt with at home -SLP told brother this is the most basic task for speech right now and all he needs currently are a mirror, his model, some sticky notes, and if desired some common objects which are short words in pt's first language. Encouraged brother to have pt work with his and husband's names (Caitlain tsu, and tom, respectively).  Assessment / Recommendations / Plan   Plan Continue with current plan of care            SLP Education - 12/23/20 1512    Education Details apraxia tasks at home, simple language tasks at home    Person(s) Educated Patient;Caregiver(s);Spouse    Methods Explanation;Demonstration;Verbal cues    Comprehension Verbalized understanding;Returned demonstration;Verbal cues required;Need further instruction            SLP Short Term Goals - 12/23/20 1513      SLP SHORT TERM GOAL #1   Title Pt will use mulitmodal communication (gesture, draw, write 1st letter etc) to augment verbal expression of basic wants/needs with usual mod A for 3 sessions    Time 2    Period Weeks    Status  On-going      SLP SHORT TERM GOAL #2   Title Pt's caregivers will appropriately cue patient and use alternative communication means when needed with occasional min A from SLP over 2 sessions    Time 2    Period Weeks    Status On-going      SLP SHORT TERM GOAL #3   Title Pt will approximate one-word personally relevant responses to supplement alternatives means of communication with usual mod A over 2 sessions    Time 2    Period Weeks    Status On-going      SLP SHORT TERM GOAL #4   Title Pt will ID object in field of 4 to communicate wants/needs or demonstrate understanding with 75% accuracy given occasional min A over 3 sessions    Time 2    Period Weeks    Status On-going      SLP SHORT TERM GOAL #5   Title Pt will tolerate trials of dysphagia 3/mechanical soft consistency with no overt s/sx of aspiration given occasional min A over 3 sessions    Time 2    Period Weeks    Status On-going            SLP Long Term Goals - 12/23/20 1514      SLP LONG TERM GOAL #1   Title Pt will use mulitmodal communication (gesture, draw, write 1st letter etc) to augment verbal expression to meet needs at home with occasional mod A from family.    Time 6    Period Weeks    Status On-going      SLP LONG TERM GOAL #2   Title Pt will correctly ID item in field of 6 to communicate wants/needs with 75% accuracy given occasional min A over 2 sessions    Time 6    Period Weeks    Status On-going      SLP LONG TERM GOAL #3   Title Pt will approximate 3 personally relevant one word responses to supplement alternatives means of communication with usual mod A over 2 sessions    Time 6    Period Weeks    Status On-going      SLP LONG TERM GOAL #4   Title Caregivers will report improvements in communication effectiveness via QOL scale by last ST session    Time 6    Period Weeks    Status On-going      SLP LONG TERM GOAL #5   Title Pt will tolerate safest and least restricitive diet with  no overt s/sx of aspiration reported or exhibited over 3 sessions    Time 6    Period Weeks  Status On-going            Plan - 12/23/20 1513    Clinical Impression Statement Terryn was referred for OPST intervention to address severe verbal apraxia, severe expressive/mod receptive aphasia, and 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 See "skilled intervention" for more details.  Due to severity of apraxia, aphasia, and persistent dysphagia secondary to oral motor apraxia, SLP recommends skilled ST intervention to improve efficiency and accuracy of communication of basic wants/needs and to improve swallow function to reduce risk of aspiration and optimize QOL.    Speech Therapy Frequency 2x / week    Duration 8 weeks   or 17 total visits   Treatment/Interventions Aspiration precaution training;Trials of upgraded texture/liquids;Oral motor exercises;Compensatory strategies;Pharyngeal strengthening exercises;Cueing hierarchy;Functional tasks;Patient/family education;Diet toleration management by SLP;Environmental controls;Cognitive reorganization;Multimodal communcation approach;Language facilitation;Compensatory techniques;Internal/external aids;SLP instruction and feedback    Potential to Achieve Goals Fair    Potential Considerations Severity of impairments;Co-morbidities;Ability to learn/carryover information    SLP Home Exercise Plan provided    Consulted and Agree with Plan of Care Patient;Family member/caregiver    Family Member Consulted husband           Patient will benefit from skilled therapeutic intervention in order to improve the following deficits and impairments:   Verbal apraxia  Aphasia  Dysphagia, oropharyngeal phase  Cognitive communication deficit    Problem List Patient Active Problem List   Diagnosis Date Noted  . Right hemiplegia ([place])   . Urinary retention   . Hyperglycemia   .  Essential hypertension   . Dysphagia, post-stroke   . 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 Left middle cerebral artery stroke ([place]) 11/01/2020  . Dysphagia 11/01/2020  . Middle cerebral artery stenosis, left 10/26/2020  . Acute CVA (cerebrovascular accident) ([place]) 10/24/2020  . left MCA stroke 10/23/2020  . Meningioma ([place]) 10/23/2020  . Cerebral edema ([place]) 10/23/2020  . Global aphasia 10/23/2020  . Hypertensive urgency 10/23/2020    [facility] ,[place], [place]  12/23/2020, 3:14 PM  [facility] [address] [place], [place], [address] Phone: [phone]   Fax:  [phone]   Name: Janat Tabbert MRN: [id number] Date of Birth: 04-08-1951

## 2020-12-23 NOTE — Therapy (Signed)
[facility] [address] [place], [place], [address] Phone: [phone]   Fax:  [phone]  Physical Therapy Treatment  Patient Details  Name: Brittany Griffith MRN: [id number] Date of Birth: [birth year] Referring Provider (PT): Lauraine Rinne, [place]   Encounter Date: 12/23/2020   PT End of Session - 12/23/20 1344    Visit Number 6    Number of Visits 21    Date for PT Re-Evaluation 02/07/21    Authorization Type [employer] Medicare; $30 copay per day for all disciplines, Eval /19/22,    Progress Note Due on Visit 10    Equipment Utilized During Treatment Gait belt    Activity Tolerance Patient tolerated treatment well    Behavior During Therapy Flat affect           Past Medical History:  Diagnosis Date  . Hypercholesteremia   . Hypertension   . Osteopenia   . Stroke [facility])     Past Surgical History:  Procedure Laterality Date  . IR ANGIO INTRA EXTRACRAN SEL COM CAROTID INNOMINATE UNI R MOD SED  10/26/2020  . IR INTRA CRAN STENT  10/26/2020  . IR RADIOLOGIST EVAL & MGMT  12/09/2020  . RADIOLOGY WITH ANESTHESIA Left 10/26/2020   Procedure: RADIOLOGY WITH ANESTHESIA LEFT MCA STENT;  Surgeon: Luanne Bras, MD;  Location: [facility];  Service: Radiology;  Laterality: Left;    There were no vitals filed for this visit.   Subjective Assessment - 12/23/20 1457    Subjective Brother and husband present. She has increased shoulder pain for last day and has been wearing sling to support R arm.    Patient is accompained by: Family member    Limitations Sitting;Lifting;Standing;Walking;House hold activities    How long can you sit comfortably? no issue    How long can you stand comfortably? She can't stand    How long can you walk comfortably? She can walk with family with 25% assistance with waling for 5-10 fet at home    Currently in Pain? Yes               Fwd step up: 8" box: 10x R and L Standing on foam:  narrow BOS, EC, 3 x 1' Standing on foam: cone taps in front: spent about 68' with intermittent rests with R and L leg, 3 cones Red, yellow and green color placed in front. Pt asked to touch specific color, patient had 100% inaccuracy with random colors. Then we worked on red and yellow color. Pt asked to touch red 5x consecutively but when we alternated between red and yellow she was significantly less accurate.  Throwing bean bags towards #1, #2, #3 on floor: Seated, standing on floor, standing on round side of 1/2 foam roll Pt asked to throw with consercutiven 1-2-3 or 3-2-1 and pt had good accurancy but when randomized, pt had significant decline in accuracy. When PT had finger (1 finger for #1, 2 fingers for #2 ), pt's accuracy significantly improved.                       PT Short Term Goals - 11/29/20 1429      PT SHORT TERM GOAL #1   Title pt will be able to ambulate 300' with quad cane with CGA and without evidence of LOB to improve short distance ambulation.    Baseline 115' quad cane CGA-MinA for occaisonal LOB (11/29/20)    Time 5  Period Weeks    Status New    Target Date 01/03/21      PT SHORT TERM GOAL #2   Title Pt will be able to go up and down12 steps with use of 1 rail and CGA to improve stair negotiation in community    Baseline not tried (11/29/20)    Time 5    Period Weeks    Status New    Target Date 01/03/21      PT SHORT TERM GOAL #3   Title Pt and family will demo compliance with at least 5 days a week walking program to improve walking endurance and practice gait.    Baseline Household ambulator only (11/29/20)    Time 5    Period Weeks    Status New    Target Date 01/03/21             PT Long Term Goals - 11/29/20 1433      PT LONG TERM GOAL #1   Title Pt will be able to ambulate on non compliant surface for 200 feet with appropriate AD with SBA to improve community negotiation.    Baseline not attempted (eval)    Time 10     Period Weeks    Status New    Target Date 02/07/21      PT LONG TERM GOAL #2   Title Pt will be able to ambulate 1050' with appropriate AD with SBA to improve community ambulation    Baseline 115' with quad cane CGA to min A (11/29/20)    Time 10    Period Weeks    Status New    Target Date 02/07/21      PT LONG TERM GOAL #3   Title Pt will demo 0.61m/s improvement on 10 meter walk test to improve walking speed and improve community ambulation    Baseline Not assessed on eval (11/29/20)    Time 10    Period Weeks    Status New    Target Date 02/07/21      PT LONG TERM GOAL #4   Title Pt will demo 4 points improvement on BBS to reduce fall risk    Baseline not assessed on eval (11/29/20)    Time 10    Period Weeks    Status New    Target Date 02/07/21                 Plan - 12/23/20 1456    Clinical Impression Statement Pt has significant congnitive decline and demonstrated difficulty with identifying colors and written numbers but was able to recognize number of fingers better during the session. With EC, pt had posterior and to the right LOB which improved iwth practice. With cognitive decline, patient has decreased safety awarneess and judgement.    Personal Factors and Comorbidities Age;Behavior Pattern;Past/Current Experience;Time since onset of injury/illness/exacerbation;Transportation    Examination-Activity Limitations Bathing;Bed Mobility;Bend;Caring for Others;Carry;Dressing;Hygiene/Grooming;Lift;Sleep;Squat;Stairs;Stand;Toileting;Transfers    Examination-Participation Restrictions Cleaning;Community Activity;Driving;Laundry;Medication Management;Meal Prep;Personal Finances;Shop;Yard Work    [employer] Evolving/Moderate complexity    Rehab Potential Good    PT Frequency 2x / week    PT Duration Other (comment)   10 weeks   PT Treatment/Interventions ADLs/Self Care Home Management;Moist Heat;Gait training;Stair training;Functional mobility  training;Therapeutic activities;Therapeutic exercise;Balance training;Neuromuscular re-education;Cognitive remediation;Patient/family education;Orthotic Fit/Training;Manual techniques;Passive range of motion;Energy conservation;Visual/perceptual remediation/compensation;Joint Manipulations    PT Next Visit Plan Continue gait training and balance tasks, SLS tasks to improve balance    PT Home Exercise Plan Access  Code [email or web address]     Consulted and Agree with Plan of Care Patient;Family member/caregiver           Patient will benefit from skilled therapeutic intervention in order to improve the following deficits and impairments:  Abnormal gait,Decreased activity tolerance,Decreased cognition,Decreased balance,Decreased coordination,Decreased safety awareness,Decreased range of motion,Decreased mobility,Decreased knowledge of use of DME,Decreased knowledge of precautions,Decreased endurance,Decreased strength,Difficulty walking,Impaired flexibility,Impaired perceived functional ability,Increased fascial restricitons,Impaired sensation,Impaired tone,Impaired UE functional use,Postural dysfunction,Pain  Visit Diagnosis: Unsteadiness on feet  Muscle weakness (generalized)  Other abnormalities of gait and mobility     Problem List Patient Active Problem List   Diagnosis Date Noted  . Right hemiplegia ([place])   . Urinary retention   . Hyperglycemia   . Essential hypertension   . Dysphagia, post-stroke   . 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 Left middle cerebral artery stroke ([place]) 11/01/2020  . Dysphagia 11/01/2020  . Middle cerebral artery stenosis, left 10/26/2020  . Acute CVA (cerebrovascular accident) ([place]) 10/24/2020  . left MCA stroke 10/23/2020  . Meningioma ([place]) 10/23/2020  . Cerebral edema ([employer]) 10/23/2020  . Global aphasia 10/23/2020  . Hypertensive urgency 10/23/2020    Kerrie Pleasure, PT 12/23/2020, 2:58 PM  [facility] [address] [place], [place], [address] Phone: [phone]   Fax:  [phone]  Name: Brittany Griffith MRN: [id number] Date of Birth: 01/26/1951

## 2020-12-27 ENCOUNTER — Ambulatory Visit: Payer: Medicare HMO

## 2020-12-27 ENCOUNTER — Other Ambulatory Visit: Payer: Self-pay

## 2020-12-27 ENCOUNTER — Other Ambulatory Visit (HOSPITAL_COMMUNITY): Payer: Self-pay | Admitting: Interventional Radiology

## 2020-12-27 ENCOUNTER — Ambulatory Visit: Payer: Medicare HMO | Admitting: Occupational Therapy

## 2020-12-27 DIAGNOSIS — R4701 Aphasia: Secondary | ICD-10-CM

## 2020-12-27 DIAGNOSIS — I6602 Occlusion and stenosis of left middle cerebral artery: Secondary | ICD-10-CM

## 2020-12-27 DIAGNOSIS — R2689 Other abnormalities of gait and mobility: Secondary | ICD-10-CM

## 2020-12-27 DIAGNOSIS — R482 Apraxia: Secondary | ICD-10-CM

## 2020-12-27 DIAGNOSIS — M6281 Muscle weakness (generalized): Secondary | ICD-10-CM

## 2020-12-27 DIAGNOSIS — R2681 Unsteadiness on feet: Secondary | ICD-10-CM

## 2020-12-27 DIAGNOSIS — I69351 Hemiplegia and hemiparesis following cerebral infarction affecting right dominant side: Secondary | ICD-10-CM

## 2020-12-27 NOTE — Therapy (Signed)
[facility] [address] [place], [place], [address] Phone: [phone]   Fax:  [phone]  Speech Language Pathology Treatment  Patient Details  Name: Brittany Griffith MRN: [id number] Date of Birth: Aug 31, 1950 Referring Provider (SLP): Cathlyn Parsons, PA-C   Encounter Date: 12/27/2020   End of Session - 12/27/20 1819    Visit Number 6    Number of Visits 17    Date for SLP Re-Evaluation 02/27/21    Authorization Type [employer] Medicare    SLP Start Time 1761    SLP Stop Time  1612    SLP Time Calculation (min) 42 min    Activity Tolerance Patient tolerated treatment well           Past Medical History:  Diagnosis Date  . Hypercholesteremia   . Hypertension   . Osteopenia   . Stroke [facility])     Past Surgical History:  Procedure Laterality Date  . IR ANGIO INTRA EXTRACRAN SEL COM CAROTID INNOMINATE UNI R MOD SED  10/26/2020  . IR INTRA CRAN STENT  10/26/2020  . IR RADIOLOGIST EVAL & MGMT  12/09/2020  . RADIOLOGY WITH ANESTHESIA Left 10/26/2020   Procedure: RADIOLOGY WITH ANESTHESIA LEFT MCA STENT;  Surgeon: Luanne Bras, MD;  Location: [facility];  Service: Radiology;  Laterality: Left;    There were no vitals filed for this visit.   Subjective Assessment - 12/27/20 1814    Subjective nodded head and smiled    Currently in Pain? Yes    Pain Score 5     Pain Location Arm    Pain Orientation Right                 ADULT SLP TREATMENT - 12/27/20 1815      General Information   Behavior/Cognition Alert;Pleasant mood;Requires cueing;Doesn't follow directions;Lethargic      Treatment Provided   Treatment provided Cognitive-Linquistic      Cognitive-Linquistic Treatment   Treatment focused on Apraxia;Patient/family/caregiver education    Skilled Treatment Family accompanied patient to therapy. Brother reported they have practiced "ee, ah, um" with limited progress. SLP targeted phoneme production  via [place] videos, in which pt able to produce /m, p, b/. Pt unable to produce /t,d, f, v/. SLP suggested family practice CV with bilateral sounds at home as pt able to demo with ~80% accuracy this session. Some perseverations exhibited. SLP discussed possibility of implementing Lingraphica or AAC device, in which SLP to create patient specific icons/categories to trial next sessions. SLP trialed yes/no board, which was ~80% accurate with increased ability to point consistently. Some increased use of gestures reported at home. SLP trialed signing this session, in which pt able to imitate proper labial placement and production for "happy birthday." SLP suggested family trial singing at home. MIT may be option for this patient. SLP provided additional education re: speech therapy process and possible outcomes.      Assessment / Recommendations / Plan   Plan Continue with current plan of care      Progression Toward Goals   Progression toward goals Progressing toward goals            SLP Education - 12/27/20 1819    Education Details language opportunities at home, singing, CV words    Person(s) Educated Patient;Caregiver(s)    Methods Explanation;Demonstration;Handout;Verbal cues    Comprehension Verbalized understanding;Returned demonstration;Verbal cues required;Need further instruction            SLP Short Term Goals -  12/27/20 1444      SLP SHORT TERM GOAL #1   Title Pt will use mulitmodal communication (gesture, draw, write 1st letter etc) to augment verbal expression of basic wants/needs with usual mod A for 3 sessions    Time 1    Period Weeks   or 9 visits for all STGs   Status On-going      SLP SHORT TERM GOAL #2   Title Pt's caregivers will appropriately cue patient and use alternative communication means when needed with occasional min A from SLP over 2 sessions    Time 1    Period Weeks    Status On-going      SLP SHORT TERM GOAL #3   Title Pt will approximate  one-word personally relevant responses to supplement alternatives means of communication with usual mod A over 2 sessions    Time 1    Period Weeks    Status On-going      SLP SHORT TERM GOAL #4   Title Pt will ID object in field of 4 to communicate wants/needs or demonstrate understanding with 75% accuracy given occasional min A over 3 sessions    Time 1    Period Weeks    Status On-going      SLP SHORT TERM GOAL #5   Title Pt will tolerate trials of dysphagia 3/mechanical soft consistency with no overt s/sx of aspiration given occasional min A over 3 sessions    Time 1    Period Weeks    Status On-going            SLP Long Term Goals - 12/27/20 1444      SLP LONG TERM GOAL #1   Title Pt will use mulitmodal communication (gesture, draw, write 1st letter etc) to augment verbal expression to meet needs at home with occasional mod A from family.    Time 5    Period Weeks   or 17 visits for all LTGs   Status On-going      SLP LONG TERM GOAL #2   Title Pt will correctly ID item in field of 6 to communicate wants/needs with 75% accuracy given occasional min A over 2 sessions    Time 5    Period Weeks    Status On-going      SLP LONG TERM GOAL #3   Title Pt will approximate 3 personally relevant one word responses to supplement alternatives means of communication with usual mod A over 2 sessions    Time 5    Period Weeks    Status On-going      SLP LONG TERM GOAL #4   Title Caregivers will report improvements in communication effectiveness via QOL scale by last ST session    Time 5    Period Weeks    Status On-going      SLP LONG TERM GOAL #5   Title Pt will tolerate safest and least restricitive diet with no overt s/sx of aspiration reported or exhibited over 3 sessions    Time 5    Period Weeks    Status On-going            Plan - 12/27/20 [place]    Clinical Impression Statement Brittany Griffith was referred for OPST intervention to address severe verbal apraxia, severe  expressive/mod receptive aphasia, and 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 Caregiver education provided for speech apraxia activities at home. Pt may benefit from  Lingraphica device with further trials to be completed in ST sessions. See "skilled intervention" for more details.  Due to severity of apraxia, aphasia, and persistent dysphagia secondary to oral motor apraxia, SLP recommends skilled ST intervention to improve efficiency and accuracy of communication of basic wants/needs and to improve swallow function to reduce risk of aspiration and optimize QOL.    Speech Therapy Frequency 2x / week    Duration 8 weeks   or 17 total visits   Treatment/Interventions Aspiration precaution training;Trials of upgraded texture/liquids;Oral motor exercises;Compensatory strategies;Pharyngeal strengthening exercises;Cueing hierarchy;Functional tasks;Patient/family education;Diet toleration management by SLP;Environmental controls;Cognitive reorganization;Multimodal communcation approach;Language facilitation;Compensatory techniques;Internal/external aids;SLP instruction and feedback    Potential to Achieve Goals Fair    Potential Considerations Severity of impairments;Co-morbidities;Ability to learn/carryover information    SLP Home Exercise Plan provided    Consulted and Agree with Plan of Care Patient;Family member/caregiver           Patient will benefit from skilled therapeutic intervention in order to improve the following deficits and impairments:   Verbal apraxia  Aphasia    Problem List Patient Active Problem List   Diagnosis Date Noted  . Right hemiplegia ([place])   . Urinary retention   . Hyperglycemia   . Essential hypertension   . Dysphagia, post-stroke   . 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 Left middle cerebral artery stroke ([place]) 11/01/2020  . Dysphagia 11/01/2020   . Middle cerebral artery stenosis, left 10/26/2020  . Acute CVA (cerebrovascular accident) ([place]) 10/24/2020  . left MCA stroke 10/23/2020  . Meningioma ([place]) 10/23/2020  . Cerebral edema ([place]) 10/23/2020  . Global aphasia 10/23/2020  . Hypertensive urgency 10/23/2020    Alinda Deem, MA CCC-SLP 12/27/2020, 6:22 PM  [facility] [address] [place], [place], [address] Phone: [phone]   Fax:  [phone]   Name: Brittany Griffith MRN: [id number] Date of Birth: 1950-09-12

## 2020-12-27 NOTE — Patient Instructions (Signed)
**MA                           MOM      ME                           MIME MY                           MAME MAY                         MOP    MOO                       MOB MOW                       MAP   **PA                           PAM PAY                        POP  PIE                         POPE P                            PIPE POOH                    PEP  DAY                      DAD DYE                      DID DO                        DOT DOE                     DEBT                              DEAD                            TIE                       TIGHT TEA                      TIDE TOE                      TOAD TO                         TAD                               TOT  FEE  FAVE FIE                         FIVE FO                          FOOD FUM                       FALL FOO  VET VAN VOTE VINE VAT   -Practice singing easy sing-a-long songs with [place] with yes/no board at home

## 2020-12-27 NOTE — Therapy (Signed)
[facility] [address] [place], [place], [address] Phone: [phone]   Fax:  [phone]  Occupational Therapy Treatment  Patient Details  Name: Brittany Griffith MRN: [id number] Date of Birth: [birth year] No data recorded  Encounter Date: 12/27/2020   OT End of Session - 12/27/20 1507    Visit Number 5    Number of Visits 25    Date for OT Re-Evaluation 03/06/21    Authorization Type [employer] MCR    Authorization - Visit Number 5    Authorization - Number of Visits 10    Progress Note Due on Visit 10    OT Start Time 1400    OT Stop Time 1445    OT Time Calculation (min) 45 min    Activity Tolerance Patient tolerated treatment well    Behavior During Therapy [facility] for tasks assessed/performed           Past Medical History:  Diagnosis Date  . Hypercholesteremia   . Hypertension   . Osteopenia   . Stroke [facility])     Past Surgical History:  Procedure Laterality Date  . IR ANGIO INTRA EXTRACRAN SEL COM CAROTID INNOMINATE UNI R MOD SED  10/26/2020  . IR INTRA CRAN STENT  10/26/2020  . IR RADIOLOGIST EVAL & MGMT  12/09/2020  . RADIOLOGY WITH ANESTHESIA Left 10/26/2020   Procedure: RADIOLOGY WITH ANESTHESIA LEFT MCA STENT;  Surgeon: Luanne Bras, MD;  Location: [facility];  Service: Radiology;  Laterality: Left;    There were no vitals filed for this visit.   Subjective Assessment - 12/27/20 1407    Subjective  Pt reported no pain during treatment session    Patient is accompanied by: Family member   husband and brother   Pertinent History Lt MCA CVA 10/23/20 w/ Rt hemiparesis, aphasia, dysarthria. PMH: HTN, HLD, Meningioma    Limitations fall risk, aphasic,    Currently in Pain? Yes    Pain Score 5     Pain Orientation Right    Pain Descriptors / Indicators Aching    Pain Type Acute pain    Pain Onset More than a month ago    Pain Frequency Intermittent    Aggravating Factors  ONLY when using it  (malpositioning?), pt had no pain during treatment session    Pain Relieving Factors rest           BUE wt bearing for lateral trunk flexion bilaterally, followed by disengaging LUE for cross body reaching to increase weight over RUE. BUE wt bearing over chair for sit to squat, followed by A/P and side to side wt shifts over BUE's while in squat (1/2 stand position). Progressed to same activity with LUE disengaged to increase weight over Rt side.  AA/ROM closed chain low range sh flexion and extension with tilted stool. Closed chain body on arm movements.  Followed by open chain low level reaching to pick up blocks and place in bowl with max assist/facilitation.                        OT Short Term Goals - 12/16/20 1442      OT SHORT TERM GOAL #1   Title Pt/family Independent with initial HEP    Time 4    Period Weeks    Status On-going      OT SHORT TERM GOAL #2   Title Pt/family independent with splint wear and care prn    Time  4    Period Weeks    Status New      OT SHORT TERM GOAL #3   Title Pt to cut food w/ AE prn    Time 4    Period Weeks    Status New      OT SHORT TERM GOAL #4   Title Pt/family to id A/E and one handed techniques to assist with BADLs (dressing, bathing, grooming, toileting)    Time 4    Period Weeks    Status New      OT SHORT TERM GOAL #5   Title Pt to demo 25* Rt shoulder flexion in prep for low level reaching    Time 4    Period Weeks    Status New      OT SHORT TERM GOAL #6   Title Pt to demo 25% finger flexion and 10% finger extension in prep for grasping/releasing Rt hand    Time 4    Period Weeks    Status New             OT Long Term Goals - 12/07/20 1112      OT LONG TERM GOAL #1   Title Pt to be independent with updated HEP    Time 12    Period Weeks    Status New      OT LONG TERM GOAL #2   Title Pt to be mod I with all BADLS using A/E prn (grooming, dressing, bathing, and toileting)    Time 12     Period Weeks    Status New      OT LONG TERM GOAL #3   Title Pt to prepare simple snack, sandwich, and/or microwaveable items mod I level    Time 12    Period Weeks    Status New      OT LONG TERM GOAL #4   Title Pt to perform RUE reaching in low ranges to grasp and release 1-2" objects consistently    Time 12    Period Weeks    Status New      OT LONG TERM GOAL #5   Title Pt to use RUE as min assist for BADLS and bilateral tasks    Time 12    Period Weeks    Status New      Long Term Additional Goals   Additional Long Term Goals Yes      OT LONG TERM GOAL #6   Title Pt to improve RUE function as evidenced by performing 6 blocks on Box & Blocks test    Baseline unable, no functional movement    Time 12    Period Weeks    Status New                 Plan - 12/27/20 1507    Clinical Impression Statement Pt progressing towards goals. Continuing NMR for RUE w/ increased composite finger flexion today    OT Occupational Profile and History Detailed Assessment- Review of Records and additional review of physical, cognitive, psychosocial history related to current functional performance    Occupational performance deficits (Please refer to evaluation for details): ADL's;IADL's;Social Participation;Work;Leisure    Body Structure / Function / Physical Skills ADL;Strength;Pain;Body mechanics;Edema;Proprioception;UE functional use;IADL;ROM;Coordination;Mobility;Sensation;FMC    Cognitive Skills Attention;Understand;Perception    Rehab Potential Good    Clinical Decision Making Several treatment options, min-mod task modification necessary    Comorbidities Affecting Occupational Performance: May have comorbidities impacting occupational performance  Modification or Assistance to Complete Evaluation  Min-Moderate modification of tasks or assist with assess necessary to complete eval    OT Frequency 2x / week    OT Duration 12 weeks   plus eval   OT Treatment/Interventions  Self-care/ADL training;Moist Heat;DME and/or AE instruction;Splinting;Therapeutic activities;Therapeutic exercise;Cognitive remediation/compensation;Coping strategies training;Neuromuscular education;Functional Mobility Training;Passive range of motion;Visual/perceptual remediation/compensation;Patient/family education;Manual Therapy;Electrical Stimulation;Energy conservation    Plan initiate ADLs hemi techniques, progress towards STG's    Consulted and Agree with Plan of Care Patient;Family member/caregiver    Family Member Consulted Husband and son           Patient will benefit from skilled therapeutic intervention in order to improve the following deficits and impairments:   Body Structure / Function / Physical Skills: ADL,Strength,Pain,Body mechanics,Edema,Proprioception,UE functional use,IADL,ROM,Coordination,Mobility,Sensation,FMC Cognitive Skills: Attention,Understand,Perception     Visit Diagnosis: Hemiplegia and hemiparesis following cerebral infarction affecting right dominant side (HCC)  Unsteadiness on feet  Muscle weakness (generalized)    Problem List Patient Active Problem List   Diagnosis Date Noted  . Right hemiplegia ([place])   . Urinary retention   . Hyperglycemia   . Essential hypertension   . Dysphagia, post-stroke   . 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 Left middle cerebral artery stroke ([place]) 11/01/2020  . Dysphagia 11/01/2020  . Middle cerebral artery stenosis, left 10/26/2020  . Acute CVA (cerebrovascular accident) ([place]) 10/24/2020  . left MCA stroke 10/23/2020  . Meningioma ([place]) 10/23/2020  . Cerebral edema ([place]) 10/23/2020  . Global aphasia 10/23/2020  . Hypertensive urgency 10/23/2020    Carey Bullocks, OTR/L 12/27/2020, 3:09 PM  [facility] [address] [place], [place], [address] Phone: [phone]   Fax:  [phone]  Name:  Brittany Griffith MRN: [id number] Date of Birth: 12-11-1950

## 2020-12-28 ENCOUNTER — Encounter: Payer: Medicare HMO | Admitting: Occupational Therapy

## 2020-12-28 ENCOUNTER — Ambulatory Visit: Payer: Medicare HMO

## 2020-12-28 NOTE — Therapy (Signed)
[facility] [address] [place], [place], [address] Phone: [phone]   Fax:  [phone]  Physical Therapy Treatment  Patient Details  Name: Brittany Griffith MRN: [id number] Date of Birth: [birth year] Referring Provider (PT): Lauraine Rinne, [place]   Encounter Date: 12/27/2020   PT End of Session - 12/27/20 1447    Visit Number 7    Number of Visits 21    Date for PT Re-Evaluation 02/07/21    Authorization Type A[employer] Medicare; $30 copay per day for all disciplines, Eval /19/22,    Progress Note Due on Visit 10    PT Start Time 1445    PT Stop Time 1530    PT Time Calculation (min) 45 min    Equipment Utilized During Treatment Gait belt    Activity Tolerance Patient tolerated treatment well    Behavior During Therapy [facility] for tasks assessed/performed           Past Medical History:  Diagnosis Date  . Hypercholesteremia   . Hypertension   . Osteopenia   . Stroke [facility])     Past Surgical History:  Procedure Laterality Date  . IR ANGIO INTRA EXTRACRAN SEL COM CAROTID INNOMINATE UNI R MOD SED  10/26/2020  . IR INTRA CRAN STENT  10/26/2020  . IR RADIOLOGIST EVAL & MGMT  12/09/2020  . RADIOLOGY WITH ANESTHESIA Left 10/26/2020   Procedure: RADIOLOGY WITH ANESTHESIA LEFT MCA STENT;  Surgeon: Luanne Bras, MD;  Location: [facility];  Service: Radiology;  Laterality: Left;    There were no vitals filed for this visit.   Subjective Assessment - 12/27/20 0800    Subjective Brother and husband present. No falls reported ans they report she is mobilizing through the home with more familiarity    Patient is accompained by: Family member    Limitations Sitting;Lifting;Standing;Walking;House hold activities    How long can you sit comfortably? no issue    How long can you stand comfortably? She can't stand    How long can you walk comfortably? She can walk with family with 25% assistance with waling for 5-10 fet at home     Patient Stated Goals Get stroonger, walk better, improve indepdnence.    Pain Onset More than a month ago                        12/27/20 0001  Transfers  Transfers Sit to Stand  Sit to Stand 7: Independent;5: Supervision  Stand to Sit 5: Supervision  Ambulation/Gait  Ambulation/Gait Yes  Ambulation/Gait Assistance 5: Supervision;4: Min guard  Ambulation Distance (Feet) 1000 Feet  Assistive device None  Gait Pattern Step-through pattern  Ambulation Surface Level;Indoor;Outdoor;Grass;Paved               Balance Exercises - 12/28/20 0001      Balance Exercises: Standing   Standing Eyes Closed Wide (BOA);Foam/compliant surface;3 reps;Time;Limitations    Standing Eyes Closed Time 60s hold with single UE support    Gait with Head Turns Forward;Limitations    Gait with Head Turns Limitations 245ft with random turns and nods calle dout    Other Standing Exercises Runner step from 4" block, 10x ea. target provided for flexion height               PT Short Term Goals - 11/29/20 1429      PT SHORT TERM GOAL #1   Title pt will be able to ambulate 300' with  quad cane with CGA and without evidence of LOB to improve short distance ambulation.    Baseline 115' quad cane CGA-MinA for occaisonal LOB (11/29/20)    Time 5    Period Weeks    Status New    Target Date 01/03/21      PT SHORT TERM GOAL #2   Title Pt will be able to go up and down12 steps with use of 1 rail and CGA to improve stair negotiation in community    Baseline not tried (11/29/20)    Time 5    Period Weeks    Status New    Target Date 01/03/21      PT SHORT TERM GOAL #3   Title Pt and family will demo compliance with at least 5 days a week walking program to improve walking endurance and practice gait.    Baseline Household ambulator only (11/29/20)    Time 5    Period Weeks    Status New    Target Date 01/03/21             PT Long Term Goals - 11/29/20 1433      PT LONG TERM  GOAL #1   Title Pt will be able to ambulate on non compliant surface for 200 feet with appropriate AD with SBA to improve community negotiation.    Baseline not attempted (eval)    Time 10    Period Weeks    Status New    Target Date 02/07/21      PT LONG TERM GOAL #2   Title Pt will be able to ambulate 1050' with appropriate AD with SBA to improve community ambulation    Baseline 115' with quad cane CGA to min A (11/29/20)    Time 10    Period Weeks    Status New    Target Date 02/07/21      PT LONG TERM GOAL #3   Title Pt will demo 0.87m/s improvement on 10 meter walk test to improve walking speed and improve community ambulation    Baseline Not assessed on eval (11/29/20)    Time 10    Period Weeks    Status New    Target Date 02/07/21      PT LONG TERM GOAL #4   Title Pt will demo 4 points improvement on BBS to reduce fall risk    Baseline not assessed on eval (11/29/20)    Time 10    Period Weeks    Status New    Target Date 02/07/21                 Plan - 12/27/20 0808    Clinical Impression Statement Continued cog deficits limit ability to follow single step commands and allow participation in complex balance tasks, ambulation distance w/o AD improved, minimal R foot scuff noted, continued single leg stance activities combined with foot placemnt tasks    Personal Factors and Comorbidities Age;Behavior Pattern;Past/Current Experience;Time since onset of injury/illness/exacerbation;Transportation    Examination-Activity Limitations Bathing;Bed Mobility;Bend;Caring for Others;Carry;Dressing;Hygiene/Grooming;Lift;Sleep;Squat;Stairs;Stand;Toileting;Transfers    Examination-Participation Restrictions Cleaning;Community Activity;Driving;Laundry;Medication Management;Meal Prep;Personal Finances;Shop;Yard Work    [employer] Evolving/Moderate complexity    Rehab Potential Good    PT Frequency 2x / week    PT Duration Other (comment)   10 weeks   PT  Treatment/Interventions ADLs/Self Care Home Management;Moist Heat;Gait training;Stair training;Functional mobility training;Therapeutic activities;Therapeutic exercise;Balance training;Neuromuscular re-education;Cognitive remediation;Patient/family education;Orthotic Fit/Training;Manual techniques;Passive range of motion;Energy conservation;Visual/perceptual remediation/compensation;Joint Manipulations    PT Next  Visit Plan Continue gait training and balance tasks, SLS tasks to improve balance, dual tasksing    PT Home Exercise Plan Access Code [email or web address]     Consulted and Agree with Plan of Care Patient;Family member/caregiver           Patient will benefit from skilled therapeutic intervention in order to improve the following deficits and impairments:  Abnormal gait,Decreased activity tolerance,Decreased cognition,Decreased balance,Decreased coordination,Decreased safety awareness,Decreased range of motion,Decreased mobility,Decreased knowledge of use of DME,Decreased knowledge of precautions,Decreased endurance,Decreased strength,Difficulty walking,Impaired flexibility,Impaired perceived functional ability,Increased fascial restricitons,Impaired sensation,Impaired tone,Impaired UE functional use,Postural dysfunction,Pain  Visit Diagnosis: Unsteadiness on feet  Muscle weakness (generalized)  Other abnormalities of gait and mobility  Hemiplegia and hemiparesis following cerebral infarction affecting right dominant side [facility])     Problem List Patient Active Problem List   Diagnosis Date Noted  . Right hemiplegia ([place])   . Urinary retention   . Hyperglycemia   . Essential hypertension   . Dysphagia, post-stroke   . 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 Dysphagia 11/01/2020  . Middle cerebral artery stenosis, left 10/26/2020  . Acute CVA (cerebrovascular accident) ([place]) 10/24/2020  .  left MCA stroke 10/23/2020  . Meningioma ([place]) 10/23/2020  . Cerebral edema ([place]) 10/23/2020  . Global aphasia 10/23/2020  . Hypertensive urgency 10/23/2020    [name] 12/28/2020, [facility] [address] [place], [place], [address] Phone: [phone]   Fax:  [phone]  Name: Brittany Griffith MRN: [id number] Date of Birth: Nov 29, 1950

## 2020-12-30 ENCOUNTER — Ambulatory Visit: Payer: Medicare HMO

## 2020-12-30 ENCOUNTER — Encounter: Payer: Self-pay | Admitting: Occupational Therapy

## 2020-12-30 ENCOUNTER — Other Ambulatory Visit: Payer: Self-pay

## 2020-12-30 ENCOUNTER — Ambulatory Visit: Payer: Medicare HMO | Admitting: Occupational Therapy

## 2020-12-30 DIAGNOSIS — R2681 Unsteadiness on feet: Secondary | ICD-10-CM | POA: Diagnosis not present

## 2020-12-30 DIAGNOSIS — R2689 Other abnormalities of gait and mobility: Secondary | ICD-10-CM

## 2020-12-30 DIAGNOSIS — R208 Other disturbances of skin sensation: Secondary | ICD-10-CM

## 2020-12-30 DIAGNOSIS — M6281 Muscle weakness (generalized): Secondary | ICD-10-CM

## 2020-12-30 DIAGNOSIS — I69351 Hemiplegia and hemiparesis following cerebral infarction affecting right dominant side: Secondary | ICD-10-CM

## 2020-12-30 DIAGNOSIS — R482 Apraxia: Secondary | ICD-10-CM

## 2020-12-30 DIAGNOSIS — I69318 Other symptoms and signs involving cognitive functions following cerebral infarction: Secondary | ICD-10-CM

## 2020-12-30 DIAGNOSIS — R4701 Aphasia: Secondary | ICD-10-CM

## 2020-12-30 NOTE — Therapy (Signed)
[facility] [address] [place], [place], [address] Phone: [phone]   Fax:  [phone]  Physical Therapy Treatment  Patient Details  Name: Brittany Griffith MRN: [id number] Date of Birth: 1951-03-24 Referring Provider (PT): Lauraine Rinne, [place]   Encounter Date: 12/30/2020   PT End of Session - 12/30/20 1351    Visit Number 8    Number of Visits 21    Date for PT Re-Evaluation 02/07/21    Authorization Type [employer]; $30 copay per day for all disciplines, Eval /19/22,    Progress Note Due on Visit 10    PT Start Time 1400    PT Stop Time 1445    PT Time Calculation (min) 45 min    Equipment Utilized During Treatment Gait belt    Activity Tolerance Patient tolerated treatment well    Behavior During Therapy [facility] for tasks assessed/performed           Past Medical History:  Diagnosis Date  . Hypercholesteremia   . Hypertension   . Osteopenia   . Stroke [facility])     Past Surgical History:  Procedure Laterality Date  . IR ANGIO INTRA EXTRACRAN SEL COM CAROTID INNOMINATE UNI R MOD SED  10/26/2020  . IR INTRA CRAN STENT  10/26/2020  . IR RADIOLOGIST EVAL & MGMT  12/09/2020  . RADIOLOGY WITH ANESTHESIA Left 10/26/2020   Procedure: RADIOLOGY WITH ANESTHESIA LEFT MCA STENT;  Surgeon: Luanne Bras, MD;  Location: [facility];  Service: Radiology;  Laterality: Left;    There were no vitals filed for this visit.   Subjective Assessment - 12/30/20 1352    Subjective She had low day yesterday so they didn't practice anything with her.    Patient is accompained by: Family member    Limitations Sitting;Lifting;Standing;Walking;House hold activities    How long can you sit comfortably? no issue    How long can you stand comfortably? She can't stand    How long can you walk comfortably? She can walk with family with 25% assistance with waling for 5-10 fet at home    Patient Stated Goals Get stroonger, walk better,  improve indepdnence.    Pain Onset More than a month ago              [facility] PT Assessment - 12/30/20 0001      Berg Balance Test   Sit to Stand Able to stand without using hands and stabilize independently    Standing Unsupported Able to stand safely 2 minutes    Sitting with Back Unsupported but Feet Supported on Floor or Stool Able to sit safely and securely 2 minutes    Stand to Sit Sits safely with minimal use of hands    Transfers Able to transfer safely, minor use of hands    Standing Unsupported with Eyes Closed Able to stand 10 seconds safely    Standing Unsupported with Feet Together Able to place feet together independently and stand 1 minute safely    From Standing, Reach Forward with Outstretched Arm Can reach confidently >25 cm (10")    From Standing Position, Pick up Object from Floor Able to pick up shoe safely and easily    From Standing Position, Turn to Look Behind Over each Shoulder Looks behind from both sides and weight shifts well    Turn 360 Degrees Able to turn 360 degrees safely in 4 seconds or less    Standing Unsupported, Alternately Place Feet on Step/Stool  Able to stand independently and safely and complete 8 steps in 20 seconds   12 s   Standing Unsupported, One Foot in Front Able to plae foot ahead of the other independently and hold 30 seconds    Standing on One Leg Tries to lift leg/unable to hold 3 seconds but remains standing independently   R leg 3 sec, L leg 10 sec   Total Score 52    Berg comment: 52/56             Throwing bean bags towards Letters A, B, C Standing on Foam with narrow BOS: - consecutive 4-5 toss to each letter: >80% accuracy - A, B, C in order (from left to right) throwing: 60% accuracy- standing on foam balance beam with rigid top - C,  B, A in order (from right to left) throwing: <30% accuracy- standing on foam balance beam with rigid top  Gait training: 1 x 220' without AD, 1 x 1050' outdoors with small incline,  decline, stepping over curb step, walking in grass >300' (uphill.downhill). Pointed to specific tree and pt asked to walk up to tree but pt unable to keep attention and   BBS: 52/56                  PT Short Term Goals - 12/30/20 1352      PT SHORT TERM GOAL #1   Title pt will be able to ambulate 300' with quad cane with CGA and without evidence of LOB to improve short distance ambulation.    Baseline 115' quad cane CGA-MinA for occaisonal LOB (11/29/20); ambulated 1050' total without AD, >300 feet in grass with up hill and down hill (12/30/20)    Time 5    Period Weeks    Status Achieved    Target Date 01/03/21      PT SHORT TERM GOAL #2   Title Pt will be able to go up and down12 steps with use of 1 rail and CGA to improve stair negotiation in community    Baseline not tried (11/29/20); 12 steps with using one rail and reciprocal steps (12/30/20)    Time 5    Period Weeks    Status New    Target Date 01/03/21      PT SHORT TERM GOAL #3   Title Pt and family will demo compliance with at least 5 days a week walking program to improve walking endurance and practice gait.    Baseline Household ambulator only (11/29/20); 10-15 min walking (12/30/20)    Time 5    Period Weeks    Status Achieved    Target Date 01/03/21             PT Long Term Goals - 12/30/20 1356      PT LONG TERM GOAL #1   Title Pt will be able to ambulate on non compliant surface for 200 feet with appropriate AD with SBA to improve community negotiation.    Baseline not attempted (eval); >300 feet with SBA/CGA for safety    Time 10    Period Weeks    Status On-going      PT LONG TERM GOAL #2   Title Pt will be able to ambulate 1050' with appropriate AD with SBA to improve community ambulation    Baseline 115' with quad cane CGA to min A (11/29/20); 1050' requires CGA for occaisonal imbalance (12/30/20)    Time 10    Period Weeks    Status  On-going      PT LONG TERM GOAL #3   Title Pt will  demo 0.16ms improvement on 10 meter walk test to improve walking speed and improve community ambulation    Baseline Not assessed on eval (11/29/20); 0.961m without AD (12/30/20)    Time 10    Period Weeks    Status New      PT LONG TERM GOAL #4   Title Pt will demo 4 points improvement on BBS to reduce fall risk    Baseline not assessed on eval (11/29/20); 17/56 (12/14/20); 52/56 (12/30/20)    Time 10    Period Weeks    Status Achieved                 Plan - 12/30/20 1510    Clinical Impression Statement Pt demonstrated slight improvement with cognitive tasks compared to 2 sessions ago with similar activities. patient demonstrated significant improvement in Berg Balance Scale test from 17/56 to 52/56 with more challange with tandem stance and SLS on R LEg. LTG#4 met today    Personal Factors and Comorbidities Age;Behavior Pattern;Past/Current Experience;Time since onset of injury/illness/exacerbation;Transportation    Examination-Activity Limitations Bathing;Bed Mobility;Bend;Caring for Others;Carry;Dressing;Hygiene/Grooming;Lift;Sleep;Squat;Stairs;Stand;Toileting;Transfers    Examination-Participation Restrictions Cleaning;Community Activity;Driving;Laundry;Medication Management;Meal Prep;Personal Finances;Shop;Yard Work    StMerchant navy officervolving/Moderate complexity    Rehab Potential Good    PT Frequency 2x / week    PT Duration Other (comment)   10 weeks   PT Treatment/Interventions ADLs/Self Care Home Management;Moist Heat;Gait training;Stair training;Functional mobility training;Therapeutic activities;Therapeutic exercise;Balance training;Neuromuscular re-education;Cognitive remediation;Patient/family education;Orthotic Fit/Training;Manual techniques;Passive range of motion;Energy conservation;Visual/perceptual remediation/compensation;Joint Manipulations    PT Next Visit Plan Continue gait training and balance tasks, SLS tasks to improve balance, dual tasksing     PT Home Exercise Plan Access Code [email or web address]     Consulted and Agree with Plan of Care Patient;Family member/caregiver           Patient will benefit from skilled therapeutic intervention in order to improve the following deficits and impairments:  Abnormal gait,Decreased activity tolerance,Decreased cognition,Decreased balance,Decreased coordination,Decreased safety awareness,Decreased range of motion,Decreased mobility,Decreased knowledge of use of DME,Decreased knowledge of precautions,Decreased endurance,Decreased strength,Difficulty walking,Impaired flexibility,Impaired perceived functional ability,Increased fascial restricitons,Impaired sensation,Impaired tone,Impaired UE functional use,Postural dysfunction,Pain  Visit Diagnosis: Unsteadiness on feet  Muscle weakness (generalized)  Other abnormalities of gait and mobility     Problem List Patient Active Problem List   Diagnosis Date Noted  . Right hemiplegia (HCFritch  . Urinary retention   . Hyperglycemia   . Essential hypertension   . Dysphagia, post-stroke   . 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 Left middle cerebral artery stroke (HCSpringview03/22/2022  . Dysphagia 11/01/2020  . Middle cerebral artery stenosis, left 10/26/2020  . Acute CVA (cerebrovascular accident) (HCTrin[place]03/14/2022  . left MCA stroke 10/23/2020  . Meningioma (HCGambr[place]03/13/2022  . Cerebral edema (HCPhilip03/13/2022  . Global aphasia 10/23/2020  . Hypertensive urgency 10/23/2020    KaKerrie PleasurePT 12/30/2020, 3:12 PM  [place]hone: [phone] Fax:  [phone]: YiKenedee MoleskyRN: [id number] of Birth: 12Oct 15, 1952

## 2020-12-30 NOTE — Therapy (Signed)
[facility] [address] [place], [place], [address] Phone: [phone]   Fax:  [phone]  Occupational Therapy Treatment  Patient Details  Name: Brittany Griffith MRN: [id number] Date of Birth: 06-24-1951 No data recorded  Encounter Date: 12/30/2020   OT End of Session - 12/30/20 1407    Visit Number 6    Number of Visits 25    Date for OT Re-Evaluation 03/06/21    Authorization Type [employer] MCR    Authorization - Visit Number 6    Authorization - Number of Visits 10    Progress Note Due on Visit 10    OT Start Time 1408   late coming from PT   OT Stop Time 1445    OT Time Calculation (min) 37 min    Activity Tolerance Patient tolerated treatment well    Behavior During Therapy [facility] for tasks assessed/performed           Past Medical History:  Diagnosis Date  . Hypercholesteremia   . Hypertension   . Osteopenia   . Stroke [facility])     Past Surgical History:  Procedure Laterality Date  . IR ANGIO INTRA EXTRACRAN SEL COM CAROTID INNOMINATE UNI R MOD SED  10/26/2020  . IR INTRA CRAN STENT  10/26/2020  . IR RADIOLOGIST EVAL & MGMT  12/09/2020  . RADIOLOGY WITH ANESTHESIA Left 10/26/2020   Procedure: RADIOLOGY WITH ANESTHESIA LEFT MCA STENT;  Surgeon: Luanne Bras, MD;  Location: [facility];  Service: Radiology;  Laterality: Left;    There were no vitals filed for this visit.   Subjective Assessment - 12/30/20 1409    Subjective  Pt denies any pain. Pt and family report patient feeling lethargic and with low motivation the past couple of days.    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 PMH: HTN, HLD, Meningioma    Limitations fall risk, aphasic,    Currently in Pain? No/denies            Electrical Stimulation NMES small muscle level 25 x 10 minutes on wrist and finger extension. Repeated with finger and  wrist flexion.  Self passive range of motion encouraged for RUE finger flexion as pt demonstrates significant stiffness.                      OT Short Term Goals - 12/16/20 1442      OT SHORT TERM GOAL #1   Title Pt/family Independent with initial HEP    Time 4    Period Weeks    Status On-going      OT SHORT TERM GOAL #2   Title Pt/family independent with splint wear and care prn    Time 4    Period Weeks    Status New      OT SHORT TERM GOAL #3   Title Pt to cut food w/ AE prn    Time 4    Period Weeks    Status New      OT SHORT TERM GOAL #4   Title Pt/family to id A/E and one handed techniques to assist with BADLs (dressing, bathing, grooming, toileting)    Time 4    Period Weeks    Status New      OT SHORT TERM GOAL #5   Title Pt to demo 25* Rt shoulder flexion in prep for  low level reaching    Time 4    Period Weeks    Status New      OT SHORT TERM GOAL #6   Title Pt to demo 25% finger flexion and 10% finger extension in prep for grasping/releasing Rt hand    Time 4    Period Weeks    Status New             OT Long Term Goals - 12/07/20 1112      OT LONG TERM GOAL #1   Title Pt to be independent with updated HEP    Time 12    Period Weeks    Status New      OT LONG TERM GOAL #2   Title Pt to be mod I with all BADLS using A/E prn (grooming, dressing, bathing, and toileting)    Time 12    Period Weeks    Status New      OT LONG TERM GOAL #3   Title Pt to prepare simple snack, sandwich, and/or microwaveable items mod I level    Time 12    Period Weeks    Status New      OT LONG TERM GOAL #4   Title Pt to perform RUE reaching in low ranges to grasp and release 1-2" objects consistently    Time 12    Period Weeks    Status New      OT LONG TERM GOAL #5   Title Pt to use RUE as min assist for BADLS and bilateral tasks    Time 12    Period Weeks    Status New      Long Term Additional Goals   Additional Long Term Goals  Yes      OT LONG TERM GOAL #6   Title Pt to improve RUE function as evidenced by performing 6 blocks on Box & Blocks test    Baseline unable, no functional movement    Time 12    Period Weeks    Status New                 Plan - 12/30/20 1455    Clinical Impression Statement Pt is progressing towards goals. Encouraged to stretch finger flexion with RUE.    OT Occupational Profile and History Detailed Assessment- Review of Records and additional review of physical, cognitive, psychosocial history related to current functional performance    Occupational performance deficits (Please refer to evaluation for details): ADL's;IADL's;Social Participation;Work;Leisure    Body Structure / Function / Physical Skills ADL;Strength;Pain;Body mechanics;Edema;Proprioception;UE functional use;IADL;ROM;Coordination;Mobility;Sensation;FMC    Cognitive Skills Attention;Understand;Perception    Rehab Potential Good    Clinical Decision Making Several treatment options, min-mod task modification necessary    Comorbidities Affecting Occupational Performance: May have comorbidities impacting occupational performance    Modification or Assistance to Complete Evaluation  Min-Moderate modification of tasks or assist with assess necessary to complete eval    OT Frequency 2x / week    OT Duration 12 weeks   plus eval   OT Treatment/Interventions Self-care/ADL training;Moist Heat;DME and/or AE instruction;Splinting;Therapeutic activities;Therapeutic exercise;Cognitive remediation/compensation;Coping strategies training;Neuromuscular education;Functional Mobility Training;Passive range of motion;Visual/perceptual remediation/compensation;Patient/family education;Manual Therapy;Electrical Stimulation;Energy conservation    Plan initiate ADLs hemi techniques, progress towards STG's    Consulted and Agree with Plan of Care Patient;Family member/caregiver    Family Member Consulted Husband and son            Patient will benefit from skilled therapeutic intervention in  order to improve the following deficits and impairments:   Body Structure / Function / Physical Skills: ADL,Strength,Pain,Body mechanics,Edema,Proprioception,UE functional use,IADL,ROM,Coordination,Mobility,Sensation,FMC Cognitive Skills: Attention,Understand,Perception     Visit Diagnosis: Unsteadiness on feet  Muscle weakness (generalized)  Other abnormalities of gait and mobility  Hemiplegia and hemiparesis following cerebral infarction affecting right dominant side (HCC)  Other disturbances of skin sensation  Other symptoms and signs involving cognitive functions following cerebral infarction    Problem List Patient Active Problem List   Diagnosis Date Noted  . Right hemiplegia ([place])   . Urinary retention   . Hyperglycemia   . Essential hypertension   . Dysphagia, post-stroke   . 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 Dysphagia 11/01/2020  . Middle cerebral artery stenosis, left 10/26/2020  . Acute CVA (cerebrovascular accident) ([place]) 10/24/2020  . left MCA stroke 10/23/2020  . Meningioma ([place]) 10/23/2020  . Cerebral edema ([place]) 10/23/2020  . Global aphasia 10/23/2020  . Hypertensive urgency 10/23/2020    Zachery Conch MOT, OTR/L  12/30/2020, 2:57 PM  [facility] [address] [place], [place], [address] Phone: [phone]   Fax:  [phone]  Name: Brittany Griffith MRN: [id number] Date of Birth: May 02, 1951

## 2020-12-30 NOTE — Therapy (Signed)
[facility] [address] [place], [place], [address] Phone: [phone]   Fax:  [phone]  Speech Language Pathology Treatment  Patient Details  Name: Brittany Griffith MRN: [id number] Date of Birth: 1951/02/07 Referring Provider (SLP): Cathlyn Parsons, PA-C   Encounter Date: 12/30/2020   End of Session - 12/30/20 1228    Visit Number 7    Number of Visits 17    Date for SLP Re-Evaluation 02/27/21    Authorization Type [employer]    SLP Start Time 9628    SLP Stop Time  1315    SLP Time Calculation (min) 45 min    Activity Tolerance Patient tolerated treatment well           Past Medical History:  Diagnosis Date  . Hypercholesteremia   . Hypertension   . Osteopenia   . Stroke [facility])     Past Surgical History:  Procedure Laterality Date  . IR ANGIO INTRA EXTRACRAN SEL COM CAROTID INNOMINATE UNI R MOD SED  10/26/2020  . IR INTRA CRAN STENT  10/26/2020  . IR RADIOLOGIST EVAL & MGMT  12/09/2020  . RADIOLOGY WITH ANESTHESIA Left 10/26/2020   Procedure: RADIOLOGY WITH ANESTHESIA LEFT MCA STENT;  Surgeon: Luanne Bras, MD;  Location: [facility];  Service: Radiology;  Laterality: Left;    There were no vitals filed for this visit.   Subjective Assessment - 12/30/20 1229    Subjective nodded head and smiled    Patient is accompained by: Family member   husband and brother   Currently in Pain? No/denies                 ADULT SLP TREATMENT - 12/30/20 1228      General Information   Behavior/Cognition Alert;Pleasant mood;Requires cueing;Doesn't follow directions;Lethargic      Treatment Provided   Treatment provided Cognitive-Linquistic      Cognitive-Linquistic Treatment   Treatment focused on Apraxia;Patient/family/caregiver education    Skilled Treatment Pt independently utilized appropriate gestures x4 this session. SLP targeted use of Lingraphica device with person-specific and general  categories (foods). Pt able to select icon and point to corresponding family member with 100% accuracy with rare min A to cue pointing to confirm comprehension. SLP targeted selecting icon in field of 8 to 12 given verbal description, in which pt able to complete with 42% accuracy, which improved to 71% accuracy with additional semantic cues. When prompted to ID favorite foods, pt able to complete with ~60% accuracy with family confirmation. Pt noted with possible right neglect and required intermittent visual cues to scan. Given verbal modeling from [place] device and SLP modeling, pt able to verbalize "applice, bana, broccoli, and Rayla." Inconsistent yes/no gestures exhibited towards end of session, despite SLP modeling. SLP educated family on providing alternative communication options instead of verbal alone due to severity of oral apraxia. Further trials with Lingraphica device recommended.      Assessment / Recommendations / Plan   Plan Continue with current plan of care      Progression Toward Goals   Progression toward goals Progressing toward goals              SLP Short Term Goals - 12/30/20 1229      SLP SHORT TERM GOAL #1   Title Pt will use mulitmodal communication (gesture, draw, write 1st letter etc) to augment verbal expression of basic wants/needs with usual mod A for 3 sessions    Baseline 12-30-20  Time 1    Period Weeks   or 9 visits for all STGs   Status On-going      SLP SHORT TERM GOAL #2   Title Pt's caregivers will appropriately cue patient and use alternative communication means when needed with occasional min A from SLP over 2 sessions    Time 1    Period Weeks    Status On-going      SLP SHORT TERM GOAL #3   Title Pt will approximate one-word personally relevant responses to supplement alternatives means of communication with usual mod A over 2 sessions    Time 1    Period Weeks    Status On-going      SLP SHORT TERM GOAL #4   Title Pt will ID  object in field of 4 to communicate wants/needs or demonstrate understanding with 75% accuracy given occasional min A over 3 sessions    Baseline 12-30-20 (family)    Time 1    Period Weeks    Status On-going      SLP SHORT TERM GOAL #5   Title Pt will tolerate trials of dysphagia 3/mechanical soft consistency with no overt s/sx of aspiration given occasional min A over 3 sessions    Time 1    Period Weeks    Status On-going            SLP Long Term Goals - 12/30/20 1229      SLP LONG TERM GOAL #1   Title Pt will use mulitmodal communication (gesture, draw, write 1st letter etc) to augment verbal expression to meet needs at home with occasional mod A from family.    Time 5    Period Weeks   or 17 visits for all LTGs   Status On-going      SLP LONG TERM GOAL #2   Title Pt will correctly ID item in field of 6 to communicate wants/needs with 75% accuracy given occasional min A over 2 sessions    Time 5    Period Weeks    Status On-going      SLP LONG TERM GOAL #3   Title Pt will approximate 3 personally relevant one word responses to supplement alternatives means of communication with usual mod A over 2 sessions    Time 5    Period Weeks    Status On-going      SLP LONG TERM GOAL #4   Title Caregivers will report improvements in communication effectiveness via QOL scale by last ST session    Time 5    Period Weeks    Status On-going      SLP LONG TERM GOAL #5   Title Pt will tolerate safest and least restricitive diet with no overt s/sx of aspiration reported or exhibited over 3 sessions    Time 5    Period Weeks    Status On-going            Plan - 12/30/20 1346    Clinical Impression Statement Brittany Griffith was referred for OPST intervention to address severe verbal apraxia, severe expressive/mod receptive aphasia, and 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 Lingraphica device trialed this session, in  which pt able to select icons of family and foods with varying accuracy. Pt able to verbalize words x4 with SLP verbal and visual modeling. Further AAC trials recommended. See "skilled intervention" for more details.  Due to severity of apraxia, aphasia, and persistent  dysphagia secondary to oral motor apraxia, SLP recommends skilled ST intervention to improve efficiency and accuracy of communication of basic wants/needs and to improve swallow function to reduce risk of aspiration and optimize QOL.    Speech Therapy Frequency 2x / week    Duration 8 weeks   or 17 total visits   Treatment/Interventions Aspiration precaution training;Trials of upgraded texture/liquids;Oral motor exercises;Compensatory strategies;Pharyngeal strengthening exercises;Cueing hierarchy;Functional tasks;Patient/family education;Diet toleration management by SLP;Environmental controls;Cognitive reorganization;Multimodal communcation approach;Language facilitation;Compensatory techniques;Internal/external aids;SLP instruction and feedback    Potential to Achieve Goals Fair    Potential Considerations Severity of impairments;Co-morbidities;Ability to learn/carryover information    SLP Home Exercise Plan provided    Consulted and Agree with Plan of Care Patient;Family member/caregiver    Family Member Consulted husband & brother           Patient will benefit from skilled therapeutic intervention in order to improve the following deficits and impairments:   Verbal apraxia  Aphasia    Problem List Patient Active Problem List   Diagnosis Date Noted  . Right hemiplegia ([place])   . Urinary retention   . Hyperglycemia   . Essential hypertension   . Dysphagia, post-stroke   . 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 Dysphagia 11/01/2020  . Middle cerebral artery stenosis, left 10/26/2020  . Acute CVA (cerebrovascular accident)  ([place]) 10/24/2020  . left MCA stroke 10/23/2020  . Meningioma ([place]) 10/23/2020  . Cerebral edema ([place]) 10/23/2020  . Global aphasia 10/23/2020  . Hypertensive urgency 10/23/2020    Alinda Deem, MA CCC-SLP 12/30/2020, 1:49 PM  [facility] [address] [place], [place], [address] Phone: [phone]   Fax:  [phone]   Name: Brittany Griffith MRN: [id number] Date of Birth: [birth year]

## 2021-01-02 ENCOUNTER — Ambulatory Visit: Payer: Medicare HMO | Admitting: Occupational Therapy

## 2021-01-02 ENCOUNTER — Ambulatory Visit: Payer: Medicare HMO

## 2021-01-02 ENCOUNTER — Other Ambulatory Visit: Payer: Self-pay

## 2021-01-02 DIAGNOSIS — R41841 Cognitive communication deficit: Secondary | ICD-10-CM

## 2021-01-02 DIAGNOSIS — M6281 Muscle weakness (generalized): Secondary | ICD-10-CM

## 2021-01-02 DIAGNOSIS — R2689 Other abnormalities of gait and mobility: Secondary | ICD-10-CM

## 2021-01-02 DIAGNOSIS — I69318 Other symptoms and signs involving cognitive functions following cerebral infarction: Secondary | ICD-10-CM

## 2021-01-02 DIAGNOSIS — R4701 Aphasia: Secondary | ICD-10-CM

## 2021-01-02 DIAGNOSIS — I69351 Hemiplegia and hemiparesis following cerebral infarction affecting right dominant side: Secondary | ICD-10-CM

## 2021-01-02 DIAGNOSIS — R2681 Unsteadiness on feet: Secondary | ICD-10-CM

## 2021-01-02 DIAGNOSIS — R208 Other disturbances of skin sensation: Secondary | ICD-10-CM

## 2021-01-02 DIAGNOSIS — R1312 Dysphagia, oropharyngeal phase: Secondary | ICD-10-CM

## 2021-01-02 DIAGNOSIS — R482 Apraxia: Secondary | ICD-10-CM

## 2021-01-02 NOTE — Therapy (Signed)
[facility] [address] [place], [place], [address] Phone: [phone]   Fax:  [phone]  Physical Therapy Treatment  Patient Details  Name: Brittany Griffith MRN: [id number] Date of Birth: 05/18/1951 Referring Provider (PT): Lauraine Rinne, [place]   Encounter Date: 01/02/2021   PT End of Session - 01/02/21 1614    Visit Number 9    Number of Visits 21    Date for PT Re-Evaluation 02/07/21    Authorization Type [employer] Medicare; $30 copay per day for all disciplines, Eval /19/22,    Progress Note Due on Visit 10    PT Start Time 1530    PT Stop Time 1615    PT Time Calculation (min) 45 min    Equipment Utilized During Treatment Gait belt    Activity Tolerance Patient tolerated treatment well    Behavior During Therapy [facility] for tasks assessed/performed           Past Medical History:  Diagnosis Date  . Hypercholesteremia   . Hypertension   . Osteopenia   . Stroke [facility])     Past Surgical History:  Procedure Laterality Date  . IR ANGIO INTRA EXTRACRAN SEL COM CAROTID INNOMINATE UNI R MOD SED  10/26/2020  . IR INTRA CRAN STENT  10/26/2020  . IR RADIOLOGIST EVAL & MGMT  12/09/2020  . RADIOLOGY WITH ANESTHESIA Left 10/26/2020   Procedure: RADIOLOGY WITH ANESTHESIA LEFT MCA STENT;  Surgeon: Luanne Bras, MD;  Location: [facility];  Service: Radiology;  Laterality: Left;    There were no vitals filed for this visit.                      [facility] Adult PT Treatment/Exercise - 01/02/21 0001      Transfers   Transfers Sit to Stand    Sit to Stand 7: Independent    Stand to Sit 5: Supervision      Ambulation/Gait   Ambulation/Gait Yes    Ambulation/Gait Assistance 4: Min guard    Ambulation Distance (Feet) 230 Feet    Assistive device None    Gait Pattern Step-through pattern    Ambulation Surface Level;Indoor    Stairs Yes    Stairs Assistance 6: Modified independent (Device/Increase time)     Stair Management Technique Alternating pattern;No rails    Number of Stairs 16    Gait Comments Not safe w/o use of rail due toRLE instability and poor safety awareness      Knee/Hip Exercises: Aerobic   Other Aerobic Scifit 8" L2 no arms               Balance Exercises - 01/02/21 0001      Balance Exercises: Standing   SLS with Vectors Foam/compliant surface;Upper extremity assist 1;Intermittent upper extremity assist;Limitations    SLS with Vectors Limitations random cone tap from airex, 30x, 10x side taps    Tandem Gait Forward;Retro;Upper extremity support;Foam/compliant surface;4 reps;Limitations    Tandem Gait Limitations trips in // bars with LUE assist    Step Over Hurdles / Cones 4 trips in // bars with LUE assist using low profile hurdles requiring cues to concentrate and pace, repeated with 4 trips sidestepping    Marching Foam/compliant surface;Intermittent upper extremity assist;Dynamic;Forwards;10 reps               PT Short Term Goals - 12/30/20 1352      PT SHORT TERM GOAL #1   Title pt will be  able to ambulate 300' with quad cane with CGA and without evidence of LOB to improve short distance ambulation.    Baseline 115' quad cane CGA-MinA for occaisonal LOB (11/29/20); ambulated 1050' total without AD, >300 feet in grass with up hill and down hill (12/30/20)    Time 5    Period Weeks    Status Achieved    Target Date 01/03/21      PT SHORT TERM GOAL #2   Title Pt will be able to go up and down12 steps with use of 1 rail and CGA to improve stair negotiation in community    Baseline not tried (11/29/20); 12 steps with using one rail and reciprocal steps (12/30/20)    Time 5    Period Weeks    Status New    Target Date 01/03/21      PT SHORT TERM GOAL #3   Title Pt and family will demo compliance with at least 5 days a week walking program to improve walking endurance and practice gait.    Baseline Household ambulator only (11/29/20); 10-15 min walking  (12/30/20)    Time 5    Period Weeks    Status Achieved    Target Date 01/03/21             PT Long Term Goals - 12/30/20 1356      PT LONG TERM GOAL #1   Title Pt will be able to ambulate on non compliant surface for 200 feet with appropriate AD with SBA to improve community negotiation.    Baseline not attempted (eval); >300 feet with SBA/CGA for safety    Time 10    Period Weeks    Status On-going      PT LONG TERM GOAL #2   Title Pt will be able to ambulate 1050' with appropriate AD with SBA to improve community ambulation    Baseline 115' with quad cane CGA to min A (11/29/20); 1050' requires CGA for occaisonal imbalance (12/30/20)    Time 10    Period Weeks    Status On-going      PT LONG TERM GOAL #3   Title Pt will demo 0.62m/s improvement on 10 meter walk test to improve walking speed and improve community ambulation    Baseline Not assessed on eval (11/29/20); 0.57m/s without AD (12/30/20)    Time 10    Period Weeks    Status New      PT LONG TERM GOAL #4   Title Pt will demo 4 points improvement on BBS to reduce fall risk    Baseline not assessed on eval (11/29/20); 17/56 (12/14/20); 52/56 (12/30/20)    Time 10    Period Weeks    Status Achieved                 Plan - 01/02/21 1615    Clinical Impression Statement continued balance and stepping training/strategies, requires cuing to slow down for safety and accuracy, unsure if language barrier or cognition is main concern, able to decrease cadence and improve accuracy of step placement both with hurdles, tandem tasks and stair climbing, reinforced need to slow down with CGs    Personal Factors and Comorbidities Age;Behavior Pattern;Past/Current Experience;Time since onset of injury/illness/exacerbation;Transportation    Examination-Activity Limitations Bathing;Bed Mobility;Bend;Caring for Others;Carry;Dressing;Hygiene/Grooming;Lift;Sleep;Squat;Stairs;Stand;Toileting;Transfers    Examination-Participation  Restrictions Cleaning;Community Activity;Driving;Laundry;Medication Management;Meal Prep;Personal Finances;Shop;Yard Work    [employer] Evolving/Moderate complexity    Rehab Potential Good    PT Frequency 2x / week  PT Duration Other (comment)   10 weeks   PT Treatment/Interventions ADLs/Self Care Home Management;Moist Heat;Gait training;Stair training;Functional mobility training;Therapeutic activities;Therapeutic exercise;Balance training;Neuromuscular re-education;Cognitive remediation;Patient/family education;Orthotic Fit/Training;Manual techniques;Passive range of motion;Energy conservation;Visual/perceptual remediation/compensation;Joint Manipulations    PT Next Visit Plan Continue gait training and balance tasks, SLS tasks to improve balance, dual tasksing, controlling cadence    PT Home Exercise Plan Access Code [email or web address]     Consulted and Agree with Plan of Care Patient;Family member/caregiver           Patient will benefit from skilled therapeutic intervention in order to improve the following deficits and impairments:  Abnormal gait,Decreased activity tolerance,Decreased cognition,Decreased balance,Decreased coordination,Decreased safety awareness,Decreased range of motion,Decreased mobility,Decreased knowledge of use of DME,Decreased knowledge of precautions,Decreased endurance,Decreased strength,Difficulty walking,Impaired flexibility,Impaired perceived functional ability,Increased fascial restricitons,Impaired sensation,Impaired tone,Impaired UE functional use,Postural dysfunction,Pain  Visit Diagnosis: Unsteadiness on feet  Muscle weakness (generalized)  Other abnormalities of gait and mobility  Hemiplegia and hemiparesis following cerebral infarction affecting right dominant side [facility])     Problem List Patient Active Problem List   Diagnosis Date Noted  . Right hemiplegia ([place])   . Urinary retention   . Hyperglycemia   . Essential hypertension    . Dysphagia, post-stroke   . 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 Dysphagia 11/01/2020  . Middle cerebral artery stenosis, left 10/26/2020  . Acute CVA (cerebrovascular accident) ([place]) 10/24/2020  . left MCA stroke 10/23/2020  . Meningioma ([place]) 10/23/2020  . Cerebral edema ([place]) 10/23/2020  . Global aphasia 10/23/2020  . Hypertensive urgency 10/23/2020    [name] PT 01/02/2021, 4:20 PM  [facility] [address] [place], [place], [address] Phone: [phone]   Fax:  [phone]  Name: Brittany Griffith MRN: [id number] Date of Birth: [birth year]

## 2021-01-02 NOTE — Therapy (Signed)
[facility] [address] [place], [place], [address] Phone: [phone]   Fax:  [phone]  Occupational Therapy Treatment  Patient Details  Name: Brittany Griffith MRN: [id number] Date of Birth: 03-25-1951 No data recorded  Encounter Date: 01/02/2021   OT End of Session - 01/02/21 1452    Visit Number 7    Number of Visits 25    Date for OT Re-Evaluation 03/06/21    Authorization Type [employer] MCR    Authorization - Visit Number 7    Authorization - Number of Visits 10    Progress Note Due on Visit 10    OT Start Time 1451    OT Stop Time 1530    OT Time Calculation (min) 39 min    Activity Tolerance Patient tolerated treatment well    Behavior During Therapy [facility] for tasks assessed/performed           Past Medical History:  Diagnosis Date  . Hypercholesteremia   . Hypertension   . Osteopenia   . Stroke [facility])     Past Surgical History:  Procedure Laterality Date  . IR ANGIO INTRA EXTRACRAN SEL COM CAROTID INNOMINATE UNI R MOD SED  10/26/2020  . IR INTRA CRAN STENT  10/26/2020  . IR RADIOLOGIST EVAL & MGMT  12/09/2020  . RADIOLOGY WITH ANESTHESIA Left 10/26/2020   Procedure: RADIOLOGY WITH ANESTHESIA LEFT MCA STENT;  Surgeon: Luanne Bras, MD;  Location: [place];  Service: Radiology;  Laterality: Left;    There were no vitals filed for this visit.   Subjective Assessment - 01/02/21 1452    Subjective  Pt denies any pain. Pt and family report patient feeling lethargic and with low motivation the past couple of days.    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 PMH: HTN, HLD, Meningioma    Limitations fall risk, aphasic,    Currently in Pain? Yes   d/t aphasia not clear on exact pain scale.   Pain Score 4     Pain Location Hand    Pain Orientation Right    Pain Descriptors / Indicators Aching    Pain  Type Acute pain           Weight bearing in RUE for inhibition of tone and facilitation of muscle activation while reaching with LUE to place resistance clothespins. Pt required mod assistance for maintaining hand position on mat for weight bearing. Good response to weight bearing this day.   NMR with forward reaching development with RUE with stool and again with hemi glide with hemi attachment to secure hand. Pt req'd max assistance for maintaining RUE hand positioning. Active movement and activation noted at anterior delt and scap for reaching.                         OT Short Term Goals - 12/16/20 1442      OT SHORT TERM GOAL #1   Title Pt/family Independent with initial HEP    Time 4    Period Weeks    Status On-going      OT SHORT TERM GOAL #2   Title Pt/family independent with splint wear and care prn    Time 4    Period Weeks    Status New      OT SHORT TERM GOAL #3   Title Pt to  cut food w/ AE prn    Time 4    Period Weeks    Status New      OT SHORT TERM GOAL #4   Title Pt/family to id A/E and one handed techniques to assist with BADLs (dressing, bathing, grooming, toileting)    Time 4    Period Weeks    Status New      OT SHORT TERM GOAL #5   Title Pt to demo 25* Rt shoulder flexion in prep for low level reaching    Time 4    Period Weeks    Status New      OT SHORT TERM GOAL #6   Title Pt to demo 25% finger flexion and 10% finger extension in prep for grasping/releasing Rt hand    Time 4    Period Weeks    Status New             OT Long Term Goals - 12/07/20 1112      OT LONG TERM GOAL #1   Title Pt to be independent with updated HEP    Time 12    Period Weeks    Status New      OT LONG TERM GOAL #2   Title Pt to be mod I with all BADLS using A/E prn (grooming, dressing, bathing, and toileting)    Time 12    Period Weeks    Status New      OT LONG TERM GOAL #3   Title Pt to prepare simple snack, sandwich, and/or  microwaveable items mod I level    Time 12    Period Weeks    Status New      OT LONG TERM GOAL #4   Title Pt to perform RUE reaching in low ranges to grasp and release 1-2" objects consistently    Time 12    Period Weeks    Status New      OT LONG TERM GOAL #5   Title Pt to use RUE as min assist for BADLS and bilateral tasks    Time 12    Period Weeks    Status New      Long Term Additional Goals   Additional Long Term Goals Yes      OT LONG TERM GOAL #6   Title Pt to improve RUE function as evidenced by performing 6 blocks on Box & Blocks test    Baseline unable, no functional movement    Time 12    Period Weeks    Status New                 Plan - 01/02/21 1532    Clinical Impression Statement Pt with brighter affect this day. Pt with stiffness with wrist extension but improved with weight bearing. Pt with good response to weight bearing this day.    OT Occupational Profile and History Detailed Assessment- Review of Records and additional review of physical, cognitive, psychosocial history related to current functional performance    Occupational performance deficits (Please refer to evaluation for details): ADL's;IADL's;Social Participation;Work;Leisure    Body Structure / Function / Physical Skills ADL;Strength;Pain;Body mechanics;Edema;Proprioception;UE functional use;IADL;ROM;Coordination;Mobility;Sensation;FMC    Cognitive Skills Attention;Understand;Perception    Rehab Potential Good    Clinical Decision Making Several treatment options, min-mod task modification necessary    Comorbidities Affecting Occupational Performance: May have comorbidities impacting occupational performance    Modification or Assistance to Complete Evaluation  Min-Moderate modification of tasks or assist with  assess necessary to complete eval    OT Frequency 2x / week    OT Duration 12 weeks   plus eval   OT Treatment/Interventions Self-care/ADL training;Moist Heat;DME and/or AE  instruction;Splinting;Therapeutic activities;Therapeutic exercise;Cognitive remediation/compensation;Coping strategies training;Neuromuscular education;Functional Mobility Training;Passive range of motion;Visual/perceptual remediation/compensation;Patient/family education;Manual Therapy;Electrical Stimulation;Energy conservation    Plan initiate ADLs hemi techniques, progress towards STG's    Consulted and Agree with Plan of Care Patient;Family member/caregiver    Family Member Consulted Husband and son           Patient will benefit from skilled therapeutic intervention in order to improve the following deficits and impairments:   Body Structure / Function / Physical Skills: ADL,Strength,Pain,Body mechanics,Edema,Proprioception,UE functional use,IADL,ROM,Coordination,Mobility,Sensation,FMC Cognitive Skills: Attention,Understand,Perception     Visit Diagnosis: Unsteadiness on feet  Muscle weakness (generalized)  Other abnormalities of gait and mobility  Hemiplegia and hemiparesis following cerebral infarction affecting right dominant side (HCC)  Other disturbances of skin sensation  Other symptoms and signs involving cognitive functions following cerebral infarction    Problem List Patient Active Problem List   Diagnosis Date Noted  . Right hemiplegia ([place])   . Urinary retention   . Hyperglycemia   . Essential hypertension   . Dysphagia, post-stroke   . 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 Dysphagia 11/01/2020  . Middle cerebral artery stenosis, left 10/26/2020  . Acute CVA (cerebrovascular accident) ([place]) 10/24/2020  . left MCA stroke 10/23/2020  . Meningioma ([place]) 10/23/2020  . Cerebral edema ([place]) 10/23/2020  . Global aphasia 10/23/2020  . Hypertensive urgency 10/23/2020    Zachery Conch MOT, OTR/L  01/02/2021, 3:33 PM  [facility] [address] [place], [place], [address] Phone: [phone]   Fax:  [phone]  Name: Brittany Griffith MRN: [id number] Date of Birth: [birth year]

## 2021-01-02 NOTE — Therapy (Signed)
[facility] [address] [place], [place], [address] Phone: [phone]   Fax:  [phone]  Speech Language Pathology Treatment  Patient Details  Name: Brittany Griffith MRN: [id number] Date of Birth: 1951/07/07 Referring Provider (SLP): Cathlyn Parsons, PA-C   Encounter Date: 01/02/2021   End of Session - 01/02/21 2345    Visit Number 8    Number of Visits 17    Date for SLP Re-Evaluation 02/27/21    Authorization Type Aetna Medicare    SLP Start Time 9163    SLP Stop Time  1446    SLP Time Calculation (min) 41 min    Activity Tolerance Patient tolerated treatment well           Past Medical History:  Diagnosis Date  . Hypercholesteremia   . Hypertension   . Osteopenia   . Stroke [facility])     Past Surgical History:  Procedure Laterality Date  . IR ANGIO INTRA EXTRACRAN SEL COM CAROTID INNOMINATE UNI R MOD SED  10/26/2020  . IR INTRA CRAN STENT  10/26/2020  . IR RADIOLOGIST EVAL & MGMT  12/09/2020  . RADIOLOGY WITH ANESTHESIA Left 10/26/2020   Procedure: RADIOLOGY WITH ANESTHESIA LEFT MCA STENT;  Surgeon: Luanne Bras, MD;  Location: [facility];  Service: Radiology;  Laterality: Left;    There were no vitals filed for this visit.   Subjective Assessment - 01/02/21 1419    Patient is accompained by: Family member;Interpreter   husband and brother   Currently in Pain? No/denies                 ADULT SLP TREATMENT - 01/02/21 1424      General Information   Behavior/Cognition Alert;Pleasant mood;Requires cueing;Doesn't follow directions      Treatment Provided   Treatment provided Cognitive-Linquistic      Dysphagia Treatment   Other treatment/comments SLP learned Brittany Griffith is eating "everytihng" the family eats, with very little coughing (reduced frequency compared to 3 weeks ago). SLP may want to assess pt's ability with dys III/regular diet in next 1-2 sessions. No overt s/sx aspiration PNA  today.      Pain Assessment   Pain Assessment No/denies pain      Cognitive-Linquistic Treatment   Treatment focused on Apraxia;Patient/family/caregiver education    Skilled Treatment Interpreter present to interpret brother and husband utterances to pt for SLP to understand and to augment pt's language comprehension. SLP trialed Lingraphica with pt by having her indicate family members with simple ?s - pt 100% accurate. Pt said "[name]" once, upon seeing word. SLP asked pt what she would access if she wanted to tell brother and husband a breakfast choice. Pt req'd Nepali interpreting and 3 cues (one from husband for "food"). SLP encouraged family best not to provide pt the icon name at this time but use a cue like "You want breakfast - where is that?" They demonstrated understanding. Pt stated "banana" upon seeing word. SLP reiterated to Brittany Griffith, brother and husband that sometimes pt seeing the word when asked the question fosters speech center to access correct word and motoric neural connections to say the word functionally or appropriately. SLP suggested a trial wiht Lingraphica and pt pointed to "yes" on the writing board. Using the "yes/no" choice written on writing board, pt answered SLP questions 6/7 correct.      Assessment / Recommendations / Plan   Plan Continue with current plan of care      Progression  Toward Goals   Progression toward goals Progressing toward goals            SLP Education - 01/02/21 2345    Education Details lingraphica trial, how best to cue pt (don't give answer away)    Person(s) Educated Patient;Spouse;Caregiver(s)    Methods Explanation    Comprehension Verbalized understanding            SLP Short Term Goals - 01/02/21 2347      SLP SHORT TERM GOAL #1   Title Pt will use mulitmodal communication (gesture, draw, write 1st letter etc) to augment verbal expression of basic wants/needs with usual mod A for 3 sessions    Baseline 12-30-20    Time 1     Period --   or 9 visits for all STGs   Status Partially Met      SLP [place] #2   Title Pt's caregivers will appropriately cue patient and use alternative communication means when needed with occasional min A from SLP over 2 sessions    Status Not Met      SLP SHORT TERM GOAL #3   Title Pt will approximate one-word personally relevant responses to supplement alternatives means of communication with usual mod A over 2 sessions    Status Not Met      SLP SHORT TERM GOAL #4   Title Pt will ID object in field of 4 to communicate wants/needs or demonstrate understanding with 75% accuracy given occasional min A over 3 sessions    Baseline 12-30-20 (family)    Status Partially Met      SLP SHORT TERM GOAL #5   Title Pt will tolerate trials of dysphagia 3/mechanical soft consistency with no overt s/sx of aspiration given occasional min A over 3 sessions    Status Unable to assess            SLP Long Term Goals - 01/02/21 2348      SLP LONG TERM GOAL #1   Title Pt will use mulitmodal communication (gesture, draw, write 1st letter etc) to augment verbal expression to meet needs at home with occasional mod A from family.    Time 4    Period Weeks   or 17 visits for all LTGs   Status On-going      SLP LONG TERM GOAL #2   Title Pt will correctly ID item in field of 6 to communicate wants/needs with 75% accuracy given occasional min A over 2 sessions    Time 4    Period Weeks    Status On-going      SLP LONG TERM GOAL #3   Title Pt will approximate 3 personally relevant one word responses to supplement alternatives means of communication with usual mod A over 2 sessions    Time 4    Period Weeks    Status On-going      SLP LONG TERM GOAL #4   Title Caregivers will report improvements in communication effectiveness via QOL scale by last ST session    Time 4    Period Weeks    Status On-going      SLP LONG TERM GOAL #5   Title Pt will tolerate safest and least restricitive  diet with no overt s/sx of aspiration reported or exhibited over 3 sessions    Time 4    Period Weeks    Status On-going            Plan - 01/02/21 2346  Clinical Impression Statement Brittany Griffith was referred for OPST intervention to address severe verbal apraxia, severe expressive/mod receptive aphasia, and 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 Lingraphica device trialed this session, in which pt able to select icons of family and foods with some accuracy. Pt able to verbalize words x2 seeing word on the [place]. PT would like to try Linguraphica trial - SLP to contact copmeany in next 1-2 days. See "skilled intervention" for more details.  Due to severity of apraxia, aphasia, and persistent dysphagia secondary to oral motor apraxia, SLP recommends skilled ST intervention to improve efficiency and accuracy of communication of basic wants/needs and to improve swallow function to reduce risk of aspiration and optimize QOL.    Speech Therapy Frequency 2x / week    Duration 8 weeks   or 17 total visits   Treatment/Interventions Aspiration precaution training;Trials of upgraded texture/liquids;Oral motor exercises;Compensatory strategies;Pharyngeal strengthening exercises;Cueing hierarchy;Functional tasks;Patient/family education;Diet toleration management by SLP;Environmental controls;Cognitive reorganization;Multimodal communcation approach;Language facilitation;Compensatory techniques;Internal/external aids;SLP instruction and feedback    Potential to Achieve Goals Fair    Potential Considerations Severity of impairments;Co-morbidities;Ability to learn/carryover information    SLP Home Exercise Plan provided    Consulted and Agree with Plan of Care Patient;Family member/caregiver    Family Member Consulted husband & brother           Patient will benefit from skilled therapeutic intervention in order to improve the  following deficits and impairments:   Verbal apraxia  Aphasia  Dysphagia, oropharyngeal phase  Cognitive communication deficit    Problem List Patient Active Problem List   Diagnosis Date Noted  . Right hemiplegia ([place])   . Urinary retention   . Hyperglycemia   . Essential hypertension   . Dysphagia, post-stroke   . 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 Dysphagia 11/01/2020  . Middle cerebral artery stenosis, left 10/26/2020  . Acute CVA (cerebrovascular accident) ([place]) 10/24/2020  . left MCA stroke 10/23/2020  . Meningioma ([place]) 10/23/2020  . Cerebral edema ([place]) 10/23/2020  . Global aphasia 10/23/2020  . Hypertensive urgency 10/23/2020    [facility], CCC-SLP  01/02/2021, 11:49 PM  [facility] [address] [place], [place], [address] Phone: [phone]   Fax:  [phone]   Name: Brittany Griffith MRN: [id number] Date of Birth: 19-May-1951

## 2021-01-04 ENCOUNTER — Ambulatory Visit: Payer: Medicare HMO

## 2021-01-04 ENCOUNTER — Encounter: Payer: Medicare HMO | Admitting: Occupational Therapy

## 2021-01-06 ENCOUNTER — Ambulatory Visit: Payer: Medicare HMO

## 2021-01-06 ENCOUNTER — Encounter: Payer: Self-pay | Admitting: Occupational Therapy

## 2021-01-06 ENCOUNTER — Other Ambulatory Visit: Payer: Self-pay

## 2021-01-06 ENCOUNTER — Ambulatory Visit: Payer: Medicare HMO | Admitting: Occupational Therapy

## 2021-01-06 DIAGNOSIS — M6281 Muscle weakness (generalized): Secondary | ICD-10-CM

## 2021-01-06 DIAGNOSIS — R208 Other disturbances of skin sensation: Secondary | ICD-10-CM

## 2021-01-06 DIAGNOSIS — I69351 Hemiplegia and hemiparesis following cerebral infarction affecting right dominant side: Secondary | ICD-10-CM

## 2021-01-06 DIAGNOSIS — R2689 Other abnormalities of gait and mobility: Secondary | ICD-10-CM

## 2021-01-06 DIAGNOSIS — R482 Apraxia: Secondary | ICD-10-CM

## 2021-01-06 DIAGNOSIS — R2681 Unsteadiness on feet: Secondary | ICD-10-CM

## 2021-01-06 DIAGNOSIS — R4701 Aphasia: Secondary | ICD-10-CM

## 2021-01-06 DIAGNOSIS — R1312 Dysphagia, oropharyngeal phase: Secondary | ICD-10-CM

## 2021-01-06 NOTE — Therapy (Signed)
[facility] [address] [place], [place], [address] Phone: [phone]   Fax:  [phone]  Speech Language Pathology Treatment  Patient Details  Name: Brittany Griffith MRN: [id number] Date of Birth: [birth year] Referring Provider (SLP): Cathlyn Parsons, PA-C   Encounter Date: 01/06/2021   End of Session - 01/06/21 1223    Visit Number 9    Number of Visits 17    Date for SLP Re-Evaluation 02/27/21    Authorization Type [employer]    SLP Start Time 4431    SLP Stop Time  1315    SLP Time Calculation (min) 45 min    Activity Tolerance Patient tolerated treatment well           Past Medical History:  Diagnosis Date  . Hypercholesteremia   . Hypertension   . Osteopenia   . Stroke [facility])     Past Surgical History:  Procedure Laterality Date  . IR ANGIO INTRA EXTRACRAN SEL COM CAROTID INNOMINATE UNI R MOD SED  10/26/2020  . IR INTRA CRAN STENT  10/26/2020  . IR RADIOLOGIST EVAL & MGMT  12/09/2020  . RADIOLOGY WITH ANESTHESIA Left 10/26/2020   Procedure: RADIOLOGY WITH ANESTHESIA LEFT MCA STENT;  Surgeon: Luanne Bras, MD;  Location: [facility];  Service: Radiology;  Laterality: Left;    There were no vitals filed for this visit.   Subjective Assessment - 01/06/21 1224    Subjective nodded head and smiled    Patient is accompained by: Family member;Interpreter   husband and brother   Currently in Pain? No/denies                 ADULT SLP TREATMENT - 01/06/21 1223      General Information   Behavior/Cognition Alert;Pleasant mood;Requires cueing;Doesn't follow directions      Treatment Provided   Treatment provided Cognitive-Linquistic;Dysphagia      Dysphagia Treatment   Treatment Methods Skilled observation;Compensation strategy training;Patient/caregiver education    Patient observed directly with PO's Yes    Type of PO's observed Thin liquids;Dysphagia 3 (soft)    Feeding Able to feed  self    Liquids provided via Cup;Straw    Oral Phase Signs & Symptoms Prolonged mastication    Pharyngeal Phase Signs & Symptoms Immediate cough   Thin liquids   Type of cueing Verbal    Amount of cueing Minimal    Other treatment/comments SLP trialed granola bar and thin liquids via cup and straw. Pt only accepted bite x1 of granola bar with slow mastication and reduced oral manipulation exhibited. Pt independently utilized liquid wash following bite. Pt noted with immediate cough following cup sip for 1/4 trials, despite cued single sips. SLP attempted to cue cough/throat clear, in which pt unable to perform with max cues and modeling. SLP trialed straw this session due to reduced labial spillage reported while drinking at home. Pt exhibited adequate labial seal around straw. No overt s/sx of asp noted for minimal trials of straw sips this session. SLP educated family that straws can facilitate improved labial seal but also increase rate and size of sips. Family verbalized understanding. Pt reportedly drinks quickly at home which results in coughing. Pt may not benefit from straw if impulsive drinking exhibited. SLP recommended cueing pt to take small, single sips.      Cognitive-Linquistic Treatment   Treatment focused on Apraxia;Patient/family/caregiver education    Skilled Treatment Pt's brother indicates patient is understanding what she is reading. SLP  trialed reading comprehension of single words given f:4, in which pt was 50% accuracy initially. Given education and repeat trial, accuracy improved to 100%. SLP assessed auditory comprehension with pt locating items given verbal prompt, in which pt able to ID item with 50% accuracy as right neglect indicated. SLP educated family on providing verbal and visual cues to scan to right side. SLP provided verbal prompt to ID specific color of written information given f:4, which pt completed 50% accuracy. Visual cues were intermittently effective. Pt able  to verbalize or approximate 3 words this session given SLP modeling.      Assessment / Recommendations / Plan   Plan Continue with current plan of care      Progression Toward Goals   Progression toward goals Progressing toward goals            SLP Education - 01/06/21 1456    Education Details small, single sips, AAC options, home practice    Person(s) Educated Patient;Spouse;Caregiver(s)    Methods Explanation;Demonstration    Comprehension Verbalized understanding            SLP Short Term Goals - 01/06/21 1223      SLP SHORT TERM GOAL #1   Title Pt will use mulitmodal communication (gesture, draw, write 1st letter etc) to augment verbal expression of basic wants/needs with usual mod A for 3 sessions    Baseline 12-30-20    Period --   or 9 visits for all STGs   Status Partially Met      SLP [place] #2   Title Pt's caregivers will appropriately cue patient and use alternative communication means when needed with occasional min A from SLP over 2 sessions    Status Not Met      SLP SHORT TERM GOAL #3   Title Pt will approximate one-word personally relevant responses to supplement alternatives means of communication with usual mod A over 2 sessions    Status Not Met      SLP SHORT TERM GOAL #4   Title Pt will ID object in field of 4 to communicate wants/needs or demonstrate understanding with 75% accuracy given occasional min A over 3 sessions    Baseline 12-30-20 (family)    Status Partially Met      SLP SHORT TERM GOAL #5   Title Pt will tolerate trials of dysphagia 3/mechanical soft consistency with no overt s/sx of aspiration given occasional min A over 3 sessions    Status Unable to assess            SLP Long Term Goals - 01/06/21 1224      SLP LONG TERM GOAL #1   Title Pt will use mulitmodal communication (gesture, draw, write 1st letter etc) to augment verbal expression to meet needs at home with occasional mod A from family.    Time 4    Period  Weeks   or 17 visits for all LTGs   Status On-going      SLP LONG TERM GOAL #2   Title Pt will correctly ID item in field of 6 to communicate wants/needs with 75% accuracy given occasional min A over 2 sessions    Time 4    Period Weeks    Status On-going      SLP LONG TERM GOAL #3   Title Pt will approximate 3 personally relevant one word responses to supplement alternatives means of communication with usual mod A over 2 sessions    Time 4  Period Weeks    Status On-going      SLP LONG TERM GOAL #4   Title Caregivers will report improvements in communication effectiveness via QOL scale by last ST session    Time 4    Period Weeks    Status On-going      SLP LONG TERM GOAL #5   Title Pt will tolerate safest and least restricitive diet with no overt s/sx of aspiration reported or exhibited over 3 sessions    Time 4    Period Weeks    Status On-going            Plan - 01/06/21 1456    Clinical Impression Statement Brittany Griffith was referred for OPST intervention to address severe verbal apraxia, severe expressive/mod receptive aphasia, and 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 SLP discussed AAC options to aid communication. SLP assessed reading/auditory comprehension of basic items, with inconsistent performance noted. Pt benefited from modeling and visual cues. Pt able to verbalize words x3 given visual aid and verbal modeling this session.  See "skilled intervention" for more details.  Due to severity of apraxia, aphasia, and persistent dysphagia secondary to oral motor apraxia, SLP recommends skilled ST intervention to improve efficiency and accuracy of communication of basic wants/needs and to improve swallow function to reduce risk of aspiration and optimize QOL.    Speech Therapy Frequency 2x / week    Duration 8 weeks   or 17 total visits   Treatment/Interventions Aspiration precaution training;Trials of  upgraded texture/liquids;Oral motor exercises;Compensatory strategies;Pharyngeal strengthening exercises;Cueing hierarchy;Functional tasks;Patient/family education;Diet toleration management by SLP;Environmental controls;Cognitive reorganization;Multimodal communcation approach;Language facilitation;Compensatory techniques;Internal/external aids;SLP instruction and feedback    Potential to Achieve Goals Fair    Potential Considerations Severity of impairments;Co-morbidities;Ability to learn/carryover information    SLP Home Exercise Plan provided    Consulted and Agree with Plan of Care Patient;Family member/caregiver    Family Member Consulted husband & brother           Patient will benefit from skilled therapeutic intervention in order to improve the following deficits and impairments:   Verbal apraxia  Aphasia  Dysphagia, oropharyngeal phase    Problem List Patient Active Problem List   Diagnosis Date Noted  . Right hemiplegia ([facility])   . Urinary retention   . Hyperglycemia   . Essential hypertension   . Dysphagia, post-stroke   . 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 Left middle cerebral artery stroke ([place]) 11/01/2020  . Dysphagia 11/01/2020  . Middle cerebral artery stenosis, left 10/26/2020  . Acute CVA (cerebrovascular accident) ([place]) 10/24/2020  . left MCA stroke 10/23/2020  . Meningioma ([place]) 10/23/2020  . Cerebral edema ([place]) 10/23/2020  . Global aphasia 10/23/2020  . Hypertensive urgency 10/23/2020    Alinda Deem, MA CCC-SLP 01/06/2021, 2:59 PM  [facility] [address] [place], [place], [address] Phone: [phone]   Fax:  [phone]   Name: Brittany Griffith MRN: [id number] Date of Birth: [birth year]

## 2021-01-06 NOTE — Therapy (Signed)
[facility] [address] [place], [place], [address] Phone: [phone]   Fax:  [phone]  Physical Therapy Treatment  Patient Details  Name: Brittany Griffith MRN: [id number] Date of Birth: [birth year] Referring Provider (PT): Lauraine Rinne, [place]   Encounter Date: 01/06/2021   PT End of Session - 01/06/21 1000    Visit Number 10    Number of Visits 21    Date for PT Re-Evaluation 02/07/21    Authorization Type [employer] Medicare; $30 copay per day for all disciplines, Eval /19/22, 10 visit PN 01/06/21    Progress Note Due on Visit 20    PT Start Time 1315    PT Stop Time 1400    PT Time Calculation (min) 45 min    Equipment Utilized During Treatment Gait belt    Activity Tolerance Patient tolerated treatment well    Behavior During Therapy [facility] for tasks assessed/performed           Past Medical History:  Diagnosis Date  . Hypercholesteremia   . Hypertension   . Osteopenia   . Stroke [facility])     Past Surgical History:  Procedure Laterality Date  . IR ANGIO INTRA EXTRACRAN SEL COM CAROTID INNOMINATE UNI R MOD SED  10/26/2020  . IR INTRA CRAN STENT  10/26/2020  . IR RADIOLOGIST EVAL & MGMT  12/09/2020  . RADIOLOGY WITH ANESTHESIA Left 10/26/2020   Procedure: RADIOLOGY WITH ANESTHESIA LEFT MCA STENT;  Surgeon: Luanne Bras, MD;  Location: [facility];  Service: Radiology;  Laterality: Left;    There were no vitals filed for this visit.   Subjective Assessment - 01/06/21 1406    Subjective Family has been workingo n walking and some cognitive exercises with her.    Patient is accompained by: Family member    Limitations Sitting;Lifting;Standing;Walking;House hold activities    How long can you sit comfortably? no issue    How long can you stand comfortably? She can't stand    How long can you walk comfortably? She can walk with family with 25% assistance with waling for 5-10 fet at home    Patient Stated Goals Get  stroonger, walk better, improve indepdnence.    Currently in Pain? No/denies    Pain Onset More than a month ago               standing on wobble board: - AP tilts: touching the animals on wall. Pt asked to touch cow, lion, snake, chicken that were placed up, down right and left and pt had to reach - ML tilts: 5 different numbers, placed top L , top R, bottom L and bottom R and horziontallY: 7, 20, 11, 16, 17  2 step commands: - 3 cones placed different areas. One placed on curb, one placed on top of steps, one placed on computer cart. Pt asked to get specific color and place it on chair. - pt asked to then pick up a specific cone and was instructed to give it to her husband or brother. -   Standing on wobble board: catching purple handkerchief in air when thorwn in multiple directions: 2'  Gait training: horizontal and vertical head turns: 10' total  Family educated on going to grocery stores and having her find Milk, eggs, breat etc. They should take her to area where item is located and then ask her to find it.            PT Short Term Goals -  01/06/21 1407      PT SHORT TERM GOAL #1   Title pt will be able to ambulate 300' with quad cane with CGA and without evidence of LOB to improve short distance ambulation.    Baseline 115' quad cane CGA-MinA for occaisonal LOB (11/29/20); ambulated 1050' total without AD, >300 feet in grass with up hill and down hill (12/30/20)    Time 5    Period Weeks    Status Achieved    Target Date 01/03/21      PT SHORT TERM GOAL #2   Title Pt will be able to go up and down12 steps with use of 1 rail and CGA to improve stair negotiation in community    Baseline not tried (11/29/20); 12 steps with using one rail and reciprocal steps (12/30/20)    Time 5    Period Weeks    Status Achieved    Target Date 01/03/21      PT SHORT TERM GOAL #3   Title Pt and family will demo compliance with at least 5 days a week walking program to improve  walking endurance and practice gait.    Baseline Household ambulator only (11/29/20); 10-15 min walking (12/30/20)    Time 5    Period Weeks    Status Achieved    Target Date 01/03/21             PT Long Term Goals - 01/06/21 1407      PT LONG TERM GOAL #1   Title Pt will be able to ambulate on non compliant surface for 200 feet with appropriate AD with SBA to improve community negotiation.    Baseline not attempted (eval); >300 feet with SBA/CGA for safety    Time 10    Period Weeks    Status On-going      PT LONG TERM GOAL #2   Title Pt will be able to ambulate 1050' with appropriate AD with SBA to improve community ambulation    Baseline 115' with quad cane CGA to min A (11/29/20); 1050' requires CGA for occaisonal imbalance (12/30/20)    Time 10    Period Weeks    Status On-going      PT LONG TERM GOAL #3   Title Pt will demo 0.6ms improvement on 10 meter walk test to improve walking speed and improve community ambulation    Baseline Not assessed on eval (11/29/20); 0.952m without AD (12/30/20)    Time 10    Period Weeks    Status On-going      PT LONG TERM GOAL #4   Title Pt will demo 4 points improvement on BBS to reduce fall risk    Baseline not assessed on eval (11/29/20); 17/56 (12/14/20); 52/56 (12/30/20)    Time 10    Period Weeks    Status Achieved                 Plan - 01/06/21 1408    Clinical Impression Statement Patient has been seen for total of 10 sessions for 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 Patient has met all of her short term goals and making gradual progress towards her long term goals. On wobble board, patient had difficult time correcting excessive lean to her R side (affected side) and needed PT's assistance to correct LOB while performing dynamic balance activities.    Personal Factors and Comorbidities Age;Behavior Pattern;Past/Current Experience;Time since onset of injury/illness/exacerbation;Transportation    Examination-Activity  Limitations Bathing;Bed Mobility;Bend;Caring for Others;Carry;Dressing;Hygiene/Grooming;Lift;Sleep;Squat;Stairs;Stand;Toileting;Transfers  Examination-Participation Restrictions Cleaning;Community Activity;Driving;Laundry;Medication Management;Meal Prep;Personal Finances;Shop;Yard Work    [employer] Evolving/Moderate complexity    Rehab Potential Good    PT Frequency 2x / week    PT Duration Other (comment)   10 weeks   PT Treatment/Interventions ADLs/Self Care Home Management;Moist Heat;Gait training;Stair training;Functional mobility training;Therapeutic activities;Therapeutic exercise;Balance training;Neuromuscular re-education;Cognitive remediation;Patient/family education;Orthotic Fit/Training;Manual techniques;Passive range of motion;Energy conservation;Visual/perceptual remediation/compensation;Joint Manipulations    PT Next Visit Plan Continue gait training and balance tasks, SLS tasks to improve balance, dual tasksing, controlling cadence    PT Home Exercise Plan Access Code [email or web address]    Consulted and Agree with Plan of Care Patient;Family member/caregiver           Patient will benefit from skilled therapeutic intervention in order to improve the following deficits and impairments:  Abnormal gait,Decreased activity tolerance,Decreased cognition,Decreased balance,Decreased coordination,Decreased safety awareness,Decreased range of motion,Decreased mobility,Decreased knowledge of use of DME,Decreased knowledge of precautions,Decreased endurance,Decreased strength,Difficulty walking,Impaired flexibility,Impaired perceived functional ability,Increased fascial restricitons,Impaired sensation,Impaired tone,Impaired UE functional use,Postural dysfunction,Pain  Visit Diagnosis: Unsteadiness on feet  Muscle weakness (generalized)  Other abnormalities of gait and mobility  Hemiplegia and hemiparesis following cerebral infarction affecting right dominant side  [facility])     Problem List Patient Active Problem List   Diagnosis Date Noted  . Right hemiplegia ([place])   . Urinary retention   . Hyperglycemia   . Essential hypertension   . Dysphagia, post-stroke   . 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 Left middle cerebral artery stroke ([place]) 11/01/2020  . Dysphagia 11/01/2020  . Middle cerebral artery stenosis, left 10/26/2020  . Acute CVA (cerebrovascular accident) ([place]) 10/24/2020  . left MCA stroke 10/23/2020  . Meningioma ([place]) 10/23/2020  . Cerebral edema ([place]) 10/23/2020  . Global aphasia 10/23/2020  . Hypertensive urgency 10/23/2020    Kerrie Pleasure, PT 01/06/2021, 2:10 PM  [facility] [address] [place], [place], [address] Phone: [phone]   Fax:  [phone]  Name: Brittany Griffith MRN: [id number] Date of Birth: September 15, 1950

## 2021-01-06 NOTE — Patient Instructions (Addendum)
Estimated Out of Pocket Costs for [name] . This amount is estimated based on the price the insurance company sets for the speech device, the co-insurance, and any deductibles that have not been met. . If you decide to recommend the device, we estimate the out of pocket expenses will be $1,190 . Financial Assistance: If assistance with the balance is needed, please have [name] and their caregiver complete the form. You can find the form here: Patent examiner o Once we receive the completed application, we will use a sliding scale to determine if we can forgive anywhere from 20% up to 100% of the anticipated expenses. o The majority of our customers pay nothing for their device! . Payment Plans: Brittany Griffith offers interest free payment plans to assist with the portion of the device that insurance does not cover.   Talk Tablet Pro- alternative option to Lingraphica ($100).   Please bring a favorite of Brittany Griffith to eat next session  Make sure you are putting things on her right side to make her scan for items

## 2021-01-06 NOTE — Therapy (Signed)
[facility] [address] [place], [place], [address] Phone: [phone]   Fax:  [phone]  Occupational Therapy Treatment  Patient Details  Name: Brittany Griffith MRN: [id number] Date of Birth: 02/08/1951 No data recorded  Encounter Date: 01/06/2021   OT End of Session - 01/06/21 1403    Visit Number 8    Number of Visits 25    Date for OT Re-Evaluation 03/06/21    Authorization Type Aetna MCR    Authorization - Visit Number 8    Authorization - Number of Visits 10    Progress Note Due on Visit 10    OT Start Time 1402    OT Stop Time 1445    OT Time Calculation (min) 43 min    Activity Tolerance Patient tolerated treatment well    Behavior During Therapy [facility] for tasks assessed/performed           Past Medical History:  Diagnosis Date  . Hypercholesteremia   . Hypertension   . Osteopenia   . Stroke [facility])     Past Surgical History:  Procedure Laterality Date  . IR ANGIO INTRA EXTRACRAN SEL COM CAROTID INNOMINATE UNI R MOD SED  10/26/2020  . IR INTRA CRAN STENT  10/26/2020  . IR RADIOLOGIST EVAL & MGMT  12/09/2020  . RADIOLOGY WITH ANESTHESIA Left 10/26/2020   Procedure: RADIOLOGY WITH ANESTHESIA LEFT MCA STENT;  Surgeon: Luanne Bras, MD;  Location: [facility];  Service: Radiology;  Laterality: Left;    There were no vitals filed for this visit.   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 PMH: HTN, HLD, Meningioma    Limitations fall risk, aphasic,    Currently in Pain? Yes   aphasia   Pain Score 3     Pain Location Wrist    Pain Orientation Right    Pain Descriptors / Indicators Sore;Aching    Pain Type Acute pain    Pain Onset In the past 7 days    Pain Frequency  Intermittent             Electrical Stimulation NMES small muscle level 25 x 10 minutes on wrist extension RUE. 5 minutes x wrist flexion. Pt with good response this day and with increased activation at finger flexors post NMES. No adverse reactions.                      OT Short Term Goals - 12/16/20 1442      OT SHORT TERM GOAL #1   Title Pt/family Independent with initial HEP    Time 4    Period Weeks    Status On-going      OT SHORT TERM GOAL #2   Title Pt/family independent with splint wear and care prn    Time 4    Period Weeks    Status New      OT SHORT TERM GOAL #3   Title Pt to cut food w/ AE prn    Time 4    Period Weeks    Status New      OT SHORT TERM GOAL #4   Title Pt/family to id A/E and one handed techniques to assist  with BADLs (dressing, bathing, grooming, toileting)    Time 4    Period Weeks    Status New      OT SHORT TERM GOAL #5   Title Pt to demo 25* Rt shoulder flexion in prep for low level reaching    Time 4    Period Weeks    Status New      OT SHORT TERM GOAL #6   Title Pt to demo 25% finger flexion and 10% finger extension in prep for grasping/releasing Rt hand    Time 4    Period Weeks    Status New             OT Long Term Goals - 12/07/20 1112      OT LONG TERM GOAL #1   Title Pt to be independent with updated HEP    Time 12    Period Weeks    Status New      OT LONG TERM GOAL #2   Title Pt to be mod I with all BADLS using A/E prn (grooming, dressing, bathing, and toileting)    Time 12    Period Weeks    Status New      OT LONG TERM GOAL #3   Title Pt to prepare simple snack, sandwich, and/or microwaveable items mod I level    Time 12    Period Weeks    Status New      OT LONG TERM GOAL #4   Title Pt to perform RUE reaching in low ranges to grasp and release 1-2" objects consistently    Time 12    Period Weeks    Status New      OT LONG TERM GOAL #5   Title Pt to use RUE as min assist  for BADLS and bilateral tasks    Time 12    Period Weeks    Status New      Long Term Additional Goals   Additional Long Term Goals Yes      OT LONG TERM GOAL #6   Title Pt to improve RUE function as evidenced by performing 6 blocks on Box & Blocks test    Baseline unable, no functional movement    Time 12    Period Weeks    Status New                 Plan - 01/06/21 1902    Clinical Impression Statement Pt with good response to estim this day with more active finger flexion noted after NMES.    OT Occupational Profile and History Detailed Assessment- Review of Records and additional review of physical, cognitive, psychosocial history related to current functional performance    Occupational performance deficits (Please refer to evaluation for details): ADL's;IADL's;Social Participation;Work;Leisure    Body Structure / Function / Physical Skills ADL;Strength;Pain;Body mechanics;Edema;Proprioception;UE functional use;IADL;ROM;Coordination;Mobility;Sensation;FMC    Cognitive Skills Attention;Understand;Perception    Rehab Potential Good    Clinical Decision Making Several treatment options, min-mod task modification necessary    Comorbidities Affecting Occupational Performance: May have comorbidities impacting occupational performance    Modification or Assistance to Complete Evaluation  Min-Moderate modification of tasks or assist with assess necessary to complete eval    OT Frequency 2x / week    OT Duration 12 weeks   plus eval   OT Treatment/Interventions Self-care/ADL training;Moist Heat;DME and/or AE instruction;Splinting;Therapeutic activities;Therapeutic exercise;Cognitive remediation/compensation;Coping strategies training;Neuromuscular education;Functional Mobility Training;Passive range of motion;Visual/perceptual remediation/compensation;Patient/family education;Manual Therapy;Electrical Stimulation;Energy conservation  Plan initiate ADLs hemi techniques, progress  towards STG's    Consulted and Agree with Plan of Care Patient;Family member/caregiver    Family Member Consulted Husband and son           Patient will benefit from skilled therapeutic intervention in order to improve the following deficits and impairments:   Body Structure / Function / Physical Skills: ADL,Strength,Pain,Body mechanics,Edema,Proprioception,UE functional use,IADL,ROM,Coordination,Mobility,Sensation,FMC Cognitive Skills: Attention,Understand,Perception     Visit Diagnosis: Hemiplegia and hemiparesis following cerebral infarction affecting right dominant side (HCC)  Other abnormalities of gait and mobility  Muscle weakness (generalized)  Unsteadiness on feet  Other disturbances of skin sensation    Problem List Patient Active Problem List   Diagnosis Date Noted  . Right hemiplegia ([place])   . Urinary retention   . Hyperglycemia   . Essential hypertension   . Dysphagia, post-stroke   . 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 Dysphagia 11/01/2020  . Middle cerebral artery stenosis, left 10/26/2020  . Acute CVA (cerebrovascular accident) ([place]) 10/24/2020  . left MCA stroke 10/23/2020  . Meningioma ([place]) 10/23/2020  . Cerebral edema ([place]) 10/23/2020  . Global aphasia 10/23/2020  . Hypertensive urgency 10/23/2020    Zachery Conch MOT, OTR/L  01/06/2021, 7:03 PM  [facility] [address] [place], [place], [address] Phone: [phone]   Fax:  [phone]  Name: Brittany Griffith MRN: [id number] Date of Birth: 1951/08/11

## 2021-01-10 ENCOUNTER — Ambulatory Visit: Payer: Medicare HMO | Admitting: Occupational Therapy

## 2021-01-10 ENCOUNTER — Encounter: Payer: Self-pay | Admitting: Physical Medicine & Rehabilitation

## 2021-01-10 ENCOUNTER — Other Ambulatory Visit: Payer: Self-pay

## 2021-01-10 ENCOUNTER — Encounter: Payer: Medicare HMO | Attending: Registered Nurse | Admitting: Physical Medicine & Rehabilitation

## 2021-01-10 ENCOUNTER — Ambulatory Visit: Payer: Medicare HMO

## 2021-01-10 VITALS — BP 136/70 | HR 74 | Temp 98.3°F | Ht 60.0 in | Wt 127.0 lb

## 2021-01-10 DIAGNOSIS — I69351 Hemiplegia and hemiparesis following cerebral infarction affecting right dominant side: Secondary | ICD-10-CM

## 2021-01-10 DIAGNOSIS — I63512 Cerebral infarction due to unspecified occlusion or stenosis of left middle cerebral artery: Secondary | ICD-10-CM | POA: Diagnosis not present

## 2021-01-10 DIAGNOSIS — M7551 Bursitis of right shoulder: Secondary | ICD-10-CM | POA: Diagnosis not present

## 2021-01-10 DIAGNOSIS — M6281 Muscle weakness (generalized): Secondary | ICD-10-CM

## 2021-01-10 DIAGNOSIS — R41841 Cognitive communication deficit: Secondary | ICD-10-CM

## 2021-01-10 DIAGNOSIS — R1312 Dysphagia, oropharyngeal phase: Secondary | ICD-10-CM

## 2021-01-10 DIAGNOSIS — R2681 Unsteadiness on feet: Secondary | ICD-10-CM

## 2021-01-10 DIAGNOSIS — R4701 Aphasia: Secondary | ICD-10-CM

## 2021-01-10 DIAGNOSIS — R2689 Other abnormalities of gait and mobility: Secondary | ICD-10-CM

## 2021-01-10 DIAGNOSIS — R482 Apraxia: Secondary | ICD-10-CM

## 2021-01-10 NOTE — Therapy (Signed)
[facility] [address] [place], [place], [address] Phone: [phone]   Fax:  [phone]  Speech Language Pathology Treatment/progress note   Patient Details  Name: Brittany Griffith MRN: [id number] Date of Birth: 1951-07-31 Referring Provider (SLP): Cathlyn Parsons, PA-C   Encounter Date: 01/10/2021   End of Session - 01/10/21 1711    Visit Number 10    Number of Visits 17    Date for SLP Re-Evaluation 02/27/21    Authorization Type Aetna Medicare    SLP Start Time 1448    SLP Stop Time  5397    SLP Time Calculation (min) 47 min    Activity Tolerance Patient tolerated treatment well           Past Medical History:  Diagnosis Date  . Hypercholesteremia   . Hypertension   . Osteopenia   . Stroke [facility])     Past Surgical History:  Procedure Laterality Date  . IR ANGIO INTRA EXTRACRAN SEL COM CAROTID INNOMINATE UNI R MOD SED  10/26/2020  . IR INTRA CRAN STENT  10/26/2020  . IR RADIOLOGIST EVAL & MGMT  12/09/2020  . RADIOLOGY WITH ANESTHESIA Left 10/26/2020   Procedure: RADIOLOGY WITH ANESTHESIA LEFT MCA STENT;  Surgeon: Luanne Bras, MD;  Location: [facility];  Service: Radiology;  Laterality: Left;    There were no vitals filed for this visit.   Speech Therapy Progress Note  Dates of Reporting Period: 11-29-20 to present  Subjective Statement: Pt seen for 10 sesions focusing on swallowing and verbal expression as well as auditory comprehension.  Objective: See below  Goal Update: See below  Plan: See below  Reason Skilled Services are Required: Pt has not reached current rehab potential. A device trial with Lingraphica has been requested due to pt's severity of deficit. 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    Subjective Assessment - 01/10/21 1500    Subjective nodded head and smiled    Patient is accompained by: Family member    Currently in Pain? No/denies                  ADULT SLP TREATMENT - 01/11/21 0001      General Information   Behavior/Cognition Alert;Pleasant mood;Requires cueing;Doesn't follow directions      Cognitive-Linquistic Treatment   Treatment focused on Apraxia;Patient/family/caregiver education    Skilled Treatment Brother again indicated Brittany Griffith is "understanding everything we say". SLP again told brother and husband that pt is likely not understanding "everything" due to what SLP has seen in [place] so far. SLP explained that husband/brother are very quick to assist pt and it might appear pt is understanding "everything" but likely due ot their heavy cueing of pt. SLP worked with pt today with Lingraphica device asking her simple questions about locations, food, and meals. Pt answered 70% success. Right neglect noted so SLP attempted to put icons in middle or on lt for reason of auditory comprehension. Pt with functional verbal response imitating Lingraphica 30% of the time. SLP explained to husband/pt/brother that trial is like borrowing from [employer] with SLP making recommendation for or not for the device based on what pt is able to do with device durin gthe trial. Husband filled out financial form and SLP emailed it to [place].      Assessment / Recommendations / Plan   Plan Continue with current plan of care      Progression Toward Goals   Progression  toward goals Progressing toward goals            SLP Education - 01/11/21 0841    Education Details how to cue pt, pt does not understand everything    Person(s) Educated Patient;Caregiver(s);Spouse    Methods Explanation;Demonstration;Verbal cues    Comprehension Verbalized understanding;Need further instruction            SLP Short Term Goals - 01/11/21 0842      SLP SHORT TERM GOAL #1   Title Pt will use mulitmodal communication (gesture, draw, write 1st letter etc) to augment verbal expression of basic wants/needs with usual mod A for 3 sessions    Baseline  12-30-20    Period --   or 9 visits for all STGs   Status Partially Met      SLP [place] #2   Title Pt's caregivers will appropriately cue patient and use alternative communication means when needed with occasional min A from SLP over 2 sessions    Status Not Met      SLP SHORT TERM GOAL #3   Title Pt will approximate one-word personally relevant responses to supplement alternatives means of communication with usual mod A over 2 sessions    Status Not Met      SLP SHORT TERM GOAL #4   Title Pt will ID object in field of 4 to communicate wants/needs or demonstrate understanding with 75% accuracy given occasional min A over 3 sessions    Baseline 12-30-20 (family)    Status Partially Met      SLP SHORT TERM GOAL #5   Title Pt will tolerate trials of dysphagia 3/mechanical soft consistency with no overt s/sx of aspiration given occasional min A over 3 sessions    Status Unable to assess            SLP Long Term Goals - 01/11/21 0842      SLP LONG TERM GOAL #1   Title Pt will use mulitmodal communication (gesture, draw, write 1st letter etc) to augment verbal expression to meet needs at home with occasional mod A from family.    Time 3    Period Weeks   or 17 visits for all LTGs   Status On-going      SLP LONG TERM GOAL #2   Title Pt will correctly ID item in field of 6 to communicate wants/needs with 75% accuracy given occasional min A over 2 sessions    Time 3    Period Weeks    Status On-going      SLP LONG TERM GOAL #3   Title Pt will approximate 3 personally relevant one word responses to supplement alternatives means of communication with usual mod A over 2 sessions    Time 3    Period Weeks    Status On-going      SLP LONG TERM GOAL #4   Title Caregivers will report improvements in communication effectiveness via QOL scale by last ST session    Time 3    Period Weeks    Status On-going      SLP LONG TERM GOAL #5   Title Pt will tolerate safest and least  restricitive diet with no overt s/sx of aspiration reported or exhibited over 3 sessions    Time 3    Period Weeks    Status On-going            Plan - 01/10/21 1711    Clinical Impression Statement Brittany Griffith was referred  for OPST intervention to address severe verbal apraxia, severe expressive/mod receptive aphasia, and 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 Pt benefited from modeling and visual cues.  See "skilled intervention" for more details.  Due to severity of apraxia, aphasia, and persistent dysphagia secondary to oral motor apraxia, SLP recommends skilled ST intervention to improve efficiency and accuracy of communication of basic wants/needs and to improve swallow function to reduce risk of aspiration and optimize QOL.    Speech Therapy Frequency 2x / week    Duration 8 weeks   or 17 total visits   Treatment/Interventions Aspiration precaution training;Trials of upgraded texture/liquids;Oral motor exercises;Compensatory strategies;Pharyngeal strengthening exercises;Cueing hierarchy;Functional tasks;Patient/family education;Diet toleration management by SLP;Environmental controls;Cognitive reorganization;Multimodal communcation approach;Language facilitation;Compensatory techniques;Internal/external aids;SLP instruction and feedback    Potential to Achieve Goals Fair    Potential Considerations Severity of impairments;Co-morbidities;Ability to learn/carryover information    SLP Home Exercise Plan provided    Consulted and Agree with Plan of Care Patient;Family member/caregiver    Family Member Consulted husband & brother           Patient will benefit from skilled therapeutic intervention in order to improve the following deficits and impairments:   Aphasia  Verbal apraxia  Dysphagia, oropharyngeal phase  Cognitive communication deficit    Problem List Patient Active Problem List   Diagnosis Date  Noted  . Right hemiplegia ([place])   . Urinary retention   . Hyperglycemia   . Essential hypertension   . Dysphagia, post-stroke   . 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 Dysphagia 11/01/2020  . Middle cerebral artery stenosis, left 10/26/2020  . Acute CVA (cerebrovascular accident) ([place]) 10/24/2020  . left MCA stroke 10/23/2020  . Meningioma ([place]) 10/23/2020  . Cerebral edema ([place]) 10/23/2020  . Global aphasia 10/23/2020  . Hypertensive urgency 10/23/2020    [facility] ,[place], CCC-SLP  01/11/2021, 8:42 AM  [facility] [address] [place], [place], [address] Phone: [phone]   Fax:  [phone]   Name: Brittany Griffith MRN: [id number] Date of Birth: 06-Sep-1950

## 2021-01-10 NOTE — Patient Instructions (Signed)
Shoulder injection with celestone and lidocaine

## 2021-01-10 NOTE — Progress Notes (Signed)
Subjective:    Patient ID: Brittany Griffith, female    DOB: 08-12-1951, 70 y.o.   MRN: [id number]  70 y.o. right-handed limited English speaking female with hyperlipidemia as well as hypertension.  Per chart review lives with spouse and independent prior to admission.  1 level home one-step to entry.  Presented 10/23/2020 with right side weakness headache as well as slurred speech.  Noted blood pressure 200/77.  CT angiogram of head and neck showed left frontal parafalcine mass with extensive regional hyperostosis possibly reflecting en plaque meningioma.  Mild edema in the left frontal lobe.  Trace local rightward midline shift.  No large vessel occlusion.  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  2.6 cm enhancing left frontal parafalcine mass consistent with meningioma minimal rightward midline shift.  Neurosurgery Dr. Venetia Constable follow-up in regards to meningioma felt to be slow-growing and would follow-up outpatient after initial evaluation by neurology services for CVA.  EEG negative for seizure maintained on Keppra for seizure prophylaxis.  Echocardiogram with ejection fraction of 60 to 65% no wall motion abnormalities.  Patient underwent cerebral angiogram 10/26/2020 followed by stent assisted angioplasty of left MCA with T TICI revascularization per interventional radiology.  Currently maintained on aspirin 81 mg daily and Brilinta 90 mg twice daily post stenting.  Cleared to begin Lovenox for DVT prophylaxis.  She was maintained on 3% saline for her cerebral edema and monitoring of sodium levels.  On 10/30/2020 patient with questionable increasing weakness right upper extremity MRI/MRI completed showing considerable extension of left middle cerebral artery territory infarction involving the vast majority of the cortical and subcortical distribution of the left middle cerebral artery with sparing of the basal ganglia and patient remained on aspirin as well  as Brilinta.    Admit date: 11/01/2020 Discharge date: 11/22/2020  HPI  Here with husband who is able to interpret.  He states pt does not speak to him in native language other than Yes and No response.  He states she understands him well.  Husband is with pt 24/7  No falls  Bathing and dressing with assistance for balance  Currently undergoing outpt PT, and OT  Pain Inventory Average Pain 4 Pain Right Now 0 My pain is intermittent and aching  LOCATION OF PAIN  Shoulder, elbow, wrist, hand, fingers  BOWEL Number of stools per week: 7 Oral laxative use No  Type of laxative  Enema or suppository use No  History of colostomy No  Incontinent No   BLADDER Normal In and out cath, frequency na Able to self cath na Bladder incontinence No  Frequent urination Yes  Leakage with coughing No  Difficulty starting stream No  Incomplete bladder emptying Yes    Mobility ability to climb steps?  yes do you drive?  no  Function retired I need assistance with the following:  dressing, bathing, meal prep, household duties and shopping  Neuro/Psych weakness numbness  Prior Studies Any changes since last visit?  no  Physicians involved in your care Primary care Dr. Radene Ou   Family History  Problem Relation Age of Onset  . Stroke Father   . Heart attack Father   . Kidney disease Brother    Social History   Socioeconomic History  . Marital status: Married    Spouse name: Not on file  . Number of children: Not on file  . Years of education: Not on file  . Highest education level: Not on file  Occupational History  . Not on file  Tobacco Use  . Smoking status: Never Smoker  . Smokeless tobacco: Never Used  Substance and Sexual Activity  . Alcohol use: Not Currently  . Drug use: Not Currently  . Sexual activity: Not on file  Other Topics Concern  . Not on file  Social History Narrative  . Not on file   Social Determinants of Health   Financial Resource  Strain: Not on file  Food Insecurity: Not on file  Transportation Needs: Not on file  Physical Activity: Not on file  Stress: Not on file  Social Connections: Not on file   Past Surgical History:  Procedure Laterality Date  . IR ANGIO INTRA EXTRACRAN SEL COM CAROTID INNOMINATE UNI R MOD SED  10/26/2020  . IR INTRA CRAN STENT  10/26/2020  . IR RADIOLOGIST EVAL & MGMT  12/09/2020  . RADIOLOGY WITH ANESTHESIA Left 10/26/2020   Procedure: RADIOLOGY WITH ANESTHESIA LEFT MCA STENT;  Surgeon: Luanne Bras, MD;  Location: [facility];  Service: Radiology;  Laterality: Left;   Past Medical History:  Diagnosis Date  . Hypercholesteremia   . Hypertension   . Osteopenia   . Stroke ([place])    BP 136/70   Pulse 74   Temp 98.3 F (36.8 C)   Ht 5' (1.524 m)   Wt 127 lb (57.6 kg)   SpO2 96%   BMI 24.80 kg/m   Opioid Risk Score:   Fall Risk Score:  `1  Depression screen PHQ 2/9  Depression screen PHQ 2/9 12/20/2020  Decreased Interest 0  Down, Depressed, Hopeless 0  PHQ - 2 Score 0    Review of Systems  Neurological: Positive for weakness and numbness.  All other systems reviewed and are negative.      Objective:   Physical Exam Vitals and nursing note reviewed.  Constitutional:      Appearance: She is normal weight.  Eyes:     Extraocular Movements: Extraocular movements intact.     Conjunctiva/sclera: Conjunctivae normal.     Pupils: Pupils are equal, round, and reactive to light.  Neurological:     Mental Status: She is alert.     Comments: Limited speech even with husbands assist  Pt aphasic but able to follow simple verbal commands in English such as turn left or turn around  Motor 2- RIght delt and biceps otherwise 0/5 in LUE,   Psychiatric:        Mood and Affect: Mood normal.        Behavior: Behavior normal.           Assessment & Plan:  1.  Left MCA distribution infarct with aphasia and Right hemiparesis Cont outpt PT, OT, SLP  2.  Subacromial  bursitis  Shoulder injection  Right subacromial   Indication:Right Shoulder pain not relieved by medication management and other conservative care.  Informed consent was obtained after describing risks and benefits of the procedure with the patient, this includes bleeding, bruising, infection and medication side effects. The patient wishes to proceed and has given written consent. Patient was placed in a seated position. Theright shoulder was marked and prepped with betadine in the subacromial area. A 25-gauge 1-1/2 inch needle was inserted into the subacromial area. After negative draw back for blood, a solution containing 1 mL of 6 mg per ML betamethasone and 4 mL of 1% lidocaine was injected. A band aid was applied. The patient tolerated the procedure well. Post procedure instructions were given.

## 2021-01-10 NOTE — Therapy (Signed)
[facility] [address] [place], [place], [address] Phone: [phone]   Fax:  [phone]  Physical Therapy Treatment  Patient Details  Name: Brittany Griffith MRN: [id number] Date of Birth: 01-Mar-1951 Referring Provider (PT): Lauraine Rinne, [place]   Encounter Date: 01/10/2021   PT End of Session - 01/10/21 1534    Visit Number 11    Number of Visits 21    Date for PT Re-Evaluation 02/07/21    Authorization Type [employer]; $30 copay per day for all disciplines, Eval /19/22, 10 visit PN 01/06/21    Progress Note Due on Visit 20    PT Start Time 1540    PT Stop Time 1615    PT Time Calculation (min) 35 min    Equipment Utilized During Treatment Gait belt    Activity Tolerance Patient tolerated treatment well    Behavior During Therapy [facility] for tasks assessed/performed           Past Medical History:  Diagnosis Date  . Hypercholesteremia   . Hypertension   . Osteopenia   . Stroke [facility])     Past Surgical History:  Procedure Laterality Date  . IR ANGIO INTRA EXTRACRAN SEL COM CAROTID INNOMINATE UNI R MOD SED  10/26/2020  . IR INTRA CRAN STENT  10/26/2020  . IR RADIOLOGIST EVAL & MGMT  12/09/2020  . RADIOLOGY WITH ANESTHESIA Left 10/26/2020   Procedure: RADIOLOGY WITH ANESTHESIA LEFT MCA STENT;  Surgeon: Luanne Bras, MD;  Location: [place];  Service: Radiology;  Laterality: Left;    There were no vitals filed for this visit.   Subjective Assessment - 01/10/21 1547    Subjective walking daily 15-20 min, sometimes twice    Patient is accompained by: Family member    Limitations Sitting;Lifting;Standing;Walking;House hold activities    How long can you sit comfortably? no issue    How long can you stand comfortably? She can't stand    How long can you walk comfortably? She can walk with family with 25% assistance with waling for 5-10 fet at home    Patient Stated Goals Get stroonger, walk better, improve  indepdnence.    Pain Onset In the past 7 days                             [facility] Adult PT Treatment/Exercise - 01/10/21 0001      Ambulation/Gait   Ambulation/Gait Yes    Ambulation/Gait Assistance 5: Supervision    Ambulation Distance (Feet) 115 Feet    Assistive device None    Gait Pattern Step-through pattern;Decreased arm swing - right    Stairs Yes    Stairs Assistance 6: Modified independent (Device/Increase time)    Stair Management Technique Alternating pattern    Number of Stairs 4    Gait Comments improved safety awareness noted today               Balance Exercises - 01/10/21 0001      Balance Exercises: Standing   Standing Eyes Opened Narrow base of support (BOS);Foam/compliant surface;Limitations    Standing Eyes Opened Limitations rockerboard WS fwd/bwd and a/p, 60s bouts, added LUE flexion 15x in ea. direction    Tandem Stance Eyes open;Foam/compliant surface;Intermittent upper extremity support;Limitations    Tandem Stance Time tandem stance in ea. position performing LUR arm swings and lateral reaching 15 attempts    SLS with Vectors Foam/compliant surface  SLS with Vectors Limitations performed cone taps from rockerboard 10x per extremity    Rockerboard Anterior/posterior;Intermittent UE support;Limitations    Rockerboard Limitations performed fwd step downs, 15x per LE               PT Short Term Goals - 01/06/21 1407      PT SHORT TERM GOAL #1   Title pt will be able to ambulate 300' with quad cane with CGA and without evidence of LOB to improve short distance ambulation.    Baseline 115' quad cane CGA-MinA for occaisonal LOB (11/29/20); ambulated 1050' total without AD, >300 feet in grass with up hill and down hill (12/30/20)    Time 5    Period Weeks    Status Achieved    Target Date 01/03/21      PT SHORT TERM GOAL #2   Title Pt will be able to go up and down12 steps with use of 1 rail and CGA to improve stair negotiation  in community    Baseline not tried (11/29/20); 12 steps with using one rail and reciprocal steps (12/30/20)    Time 5    Period Weeks    Status Achieved    Target Date 01/03/21      PT SHORT TERM GOAL #3   Title Pt and family will demo compliance with at least 5 days a week walking program to improve walking endurance and practice gait.    Baseline Household ambulator only (11/29/20); 10-15 min walking (12/30/20)    Time 5    Period Weeks    Status Achieved    Target Date 01/03/21             PT Long Term Goals - 01/06/21 1407      PT LONG TERM GOAL #1   Title Pt will be able to ambulate on non compliant surface for 200 feet with appropriate AD with SBA to improve community negotiation.    Baseline not attempted (eval); >300 feet with SBA/CGA for safety    Time 10    Period Weeks    Status On-going      PT LONG TERM GOAL #2   Title Pt will be able to ambulate 1050' with appropriate AD with SBA to improve community ambulation    Baseline 115' with quad cane CGA to min A (11/29/20); 1050' requires CGA for occaisonal imbalance (12/30/20)    Time 10    Period Weeks    Status On-going      PT LONG TERM GOAL #3   Title Pt will demo 0.53m/s improvement on 10 meter walk test to improve walking speed and improve community ambulation    Baseline Not assessed on eval (11/29/20); 0.3m/s without AD (12/30/20)    Time 10    Period Weeks    Status On-going      PT LONG TERM GOAL #4   Title Pt will demo 4 points improvement on BBS to reduce fall risk    Baseline not assessed on eval (11/29/20); 17/56 (12/14/20); 52/56 (12/30/20)    Time 10    Period Weeks    Status Achieved                 Plan - 01/10/21 1701    Clinical Impression Statement (15 min late due to prior rehab session running over)Todays session focused on balance tasks emphasizing SLS and stepping strategies.  Some difficulty following commands but was able to follow through with therapist cuing.  less overall  LOB  noted in session today    Personal Factors and Comorbidities Age;Behavior Pattern;Past/Current Experience;Time since onset of injury/illness/exacerbation;Transportation    Examination-Activity Limitations Bathing;Bed Mobility;Bend;Caring for Others;Carry;Dressing;Hygiene/Grooming;Lift;Sleep;Squat;Stairs;Stand;Toileting;Transfers    Examination-Participation Restrictions Cleaning;Community Activity;Driving;Laundry;Medication Management;Meal Prep;Personal Finances;Shop;Yard Work    [employer] Evolving/Moderate complexity    Rehab Potential Good    PT Frequency 2x / week    PT Duration Other (comment)   10 weeks   PT Treatment/Interventions ADLs/Self Care Home Management;Moist Heat;Gait training;Stair training;Functional mobility training;Therapeutic activities;Therapeutic exercise;Balance training;Neuromuscular re-education;Cognitive remediation;Patient/family education;Orthotic Fit/Training;Manual techniques;Passive range of motion;Energy conservation;Visual/perceptual remediation/compensation;Joint Manipulations    PT Next Visit Plan Continue gait training and balance tasks, SLS tasks to improve balance, dual tasksing, controlling cadence    PT Home Exercise Plan Access Code [email or web address]     Consulted and Agree with Plan of Care Patient;Family member/caregiver           Patient will benefit from skilled therapeutic intervention in order to improve the following deficits and impairments:  Abnormal gait,Decreased activity tolerance,Decreased cognition,Decreased balance,Decreased coordination,Decreased safety awareness,Decreased range of motion,Decreased mobility,Decreased knowledge of use of DME,Decreased knowledge of precautions,Decreased endurance,Decreased strength,Difficulty walking,Impaired flexibility,Impaired perceived functional ability,Increased fascial restricitons,Impaired sensation,Impaired tone,Impaired UE functional use,Postural dysfunction,Pain  Visit  Diagnosis: Hemiplegia and hemiparesis following cerebral infarction affecting right dominant side ([place])  Other abnormalities of gait and mobility  Muscle weakness (generalized)  Unsteadiness on feet     Problem List Patient Active Problem List   Diagnosis Date Noted  . Right hemiplegia ([place])   . Urinary retention   . Hyperglycemia   . Essential hypertension   . Dysphagia, post-stroke   . 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 Dysphagia 11/01/2020  . Middle cerebral artery stenosis, left 10/26/2020  . Acute CVA (cerebrovascular accident) ([place]) 10/24/2020  . left MCA stroke 10/23/2020  . Meningioma ([place]) 10/23/2020  . Cerebral edema ([place]) 10/23/2020  . Global aphasia 10/23/2020  . Hypertensive urgency 10/23/2020    [name] PT 01/10/2021, 6:05 PM  [facility] [address] [place], [place], [address] Phone: [phone]   Fax:  [phone]  Name: Shauni Henner MRN: [id number] Date of Birth: January 21, 1951

## 2021-01-10 NOTE — Therapy (Signed)
[facility] [address] [place], [place], [address] Phone: [phone]   Fax:  [phone]  Occupational Therapy Treatment  Patient Details  Name: Brittany Griffith MRN: [id number] Date of Birth: Jul 26, 1951 No data recorded  Encounter Date: 01/10/2021   OT End of Session - 01/10/21 1448    Visit Number 9    Number of Visits 25    Date for OT Re-Evaluation 03/06/21    Authorization Type [employer] MCR    Authorization - Visit Number 9    Authorization - Number of Visits 10    Progress Note Due on Visit 10    OT Start Time 1408    OT Stop Time 1450    OT Time Calculation (min) 42 min    Activity Tolerance Patient tolerated treatment well    Behavior During Therapy [facility] for tasks assessed/performed           Past Medical History:  Diagnosis Date  . Hypercholesteremia   . Hypertension   . Osteopenia   . Stroke [facility])     Past Surgical History:  Procedure Laterality Date  . IR ANGIO INTRA EXTRACRAN SEL COM CAROTID INNOMINATE UNI R MOD SED  10/26/2020  . IR INTRA CRAN STENT  10/26/2020  . IR RADIOLOGIST EVAL & MGMT  12/09/2020  . RADIOLOGY WITH ANESTHESIA Left 10/26/2020   Procedure: RADIOLOGY WITH ANESTHESIA LEFT MCA STENT;  Surgeon: Luanne Bras, MD;  Location: [place];  Service: Radiology;  Laterality: Left;    There were no vitals filed for this visit.   Subjective Assessment - 01/10/21 1410    Subjective  Husband reports pt got a shoulder injection today for pain    Patient is accompanied by: Family member   husband and brother   Pertinent History Lt MCA CVA 10/23/20 w/ Rt hemiparesis, aphasia, dysarthria. PMH: HTN, HLD, Meningioma    Limitations fall risk, aphasic,    Currently in Pain? Yes    Pain Location Arm    Pain Orientation Right    Pain Descriptors / Indicators Sore    Pain Type Acute pain    Pain Onset In the past 7 days    Pain Frequency Intermittent    Aggravating Factors  Only with movement  (pt has no pain with correct positioning)    Pain Relieving Factors Rest           Began checking STG's and progress to date. Pt able to cut "food" w/ rocker knife. Pt practiced donning/doffing shoes and socks I'ly. Simulated donning/doffing pants with theraband w/ supervision for safety. Reviewed one handed techniques for UE dressing. Unsure of carryover at home due to pt's culture and family assisting more than needed.   Closed chain sh flexion, AA/ROM BUE's in gravity eliminated plane, and wt bearing activities for body on arm movements.   NMES x 10 min to wrist and finger extensors, 50 pps, 252 pw, 10 sec. On/off cycle. Pt trying to go into supination and pulling arm back with it, cues to relax upper arm and just allow estim to open fingers.                        OT Short Term Goals - 01/10/21 1449      OT SHORT TERM GOAL #1   Title Pt/family Independent with initial HEP    Time 4    Period Weeks    Status Achieved      OT  SHORT TERM GOAL #2   Title Pt/family independent with splint wear and care prn    Time 4    Period Weeks    Status New      OT SHORT TERM GOAL #3   Title Pt to cut food w/ AE prn    Time 4    Period Weeks    Status Achieved   met in clinic with A/E     OT SHORT TERM GOAL #4   Title Pt/family to id A/E and one handed techniques to assist with BADLs (dressing, bathing, grooming, toileting)    Time 4    Period Weeks    Status Achieved      OT SHORT TERM GOAL #5   Title Pt to demo 25* Rt shoulder flexion in prep for low level reaching    Time 4    Period Weeks    Status On-going      OT SHORT TERM GOAL #6   Title Pt to demo 25% finger flexion and 10% finger extension in prep for grasping/releasing Rt hand    Time 4    Period Weeks    Status New             OT Long Term Goals - 12/07/20 1112      OT LONG TERM GOAL #1   Title Pt to be independent with updated HEP    Time 12    Period Weeks    Status New      OT LONG  TERM GOAL #2   Title Pt to be mod I with all BADLS using A/E prn (grooming, dressing, bathing, and toileting)    Time 12    Period Weeks    Status New      OT LONG TERM GOAL #3   Title Pt to prepare simple snack, sandwich, and/or microwaveable items mod I level    Time 12    Period Weeks    Status New      OT LONG TERM GOAL #4   Title Pt to perform RUE reaching in low ranges to grasp and release 1-2" objects consistently    Time 12    Period Weeks    Status New      OT LONG TERM GOAL #5   Title Pt to use RUE as min assist for BADLS and bilateral tasks    Time 12    Period Weeks    Status New      Long Term Additional Goals   Additional Long Term Goals Yes      OT LONG TERM GOAL #6   Title Pt to improve RUE function as evidenced by performing 6 blocks on Box & Blocks test    Baseline unable, no functional movement    Time 12    Period Weeks    Status New                 Plan - 01/10/21 1450    Clinical Impression Statement Pt progressing towards goals.    OT Occupational Profile and History Detailed Assessment- Review of Records and additional review of physical, cognitive, psychosocial history related to current functional performance    Occupational performance deficits (Please refer to evaluation for details): ADL's;IADL's;Social Participation;Work;Leisure    Body Structure / Function / Physical Skills ADL;Strength;Pain;Body mechanics;Edema;Proprioception;UE functional use;IADL;ROM;Coordination;Mobility;Sensation;FMC    Cognitive Skills Attention;Understand;Perception    Rehab Potential Good    Clinical Decision Making Several treatment options, min-mod task modification necessary  Comorbidities Affecting Occupational Performance: May have comorbidities impacting occupational performance    Modification or Assistance to Complete Evaluation  Min-Moderate modification of tasks or assist with assess necessary to complete eval    OT Frequency 2x / week    OT  Duration 12 weeks   plus eval   OT Treatment/Interventions Self-care/ADL training;Moist Heat;DME and/or AE instruction;Splinting;Therapeutic activities;Therapeutic exercise;Cognitive remediation/compensation;Coping strategies training;Neuromuscular education;Functional Mobility Training;Passive range of motion;Visual/perceptual remediation/compensation;Patient/family education;Manual Therapy;Electrical Stimulation;Energy conservation    Plan 10th progress note, continue NMR, estim for finger and wrist extension    Consulted and Agree with Plan of Care Patient;Family member/caregiver    Family Member Consulted Husband and son           Patient will benefit from skilled therapeutic intervention in order to improve the following deficits and impairments:   Body Structure / Function / Physical Skills: ADL,Strength,Pain,Body mechanics,Edema,Proprioception,UE functional use,IADL,ROM,Coordination,Mobility,Sensation,FMC Cognitive Skills: Attention,Understand,Perception     Visit Diagnosis: Hemiplegia and hemiparesis following cerebral infarction affecting right dominant side ([facility])  Muscle weakness (generalized)    Problem List Patient Active Problem List   Diagnosis Date Noted  . Right hemiplegia ([place])   . Urinary retention   . Hyperglycemia   . Essential hypertension   . Dysphagia, post-stroke   . 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 Dysphagia 11/01/2020  . Middle cerebral artery stenosis, left 10/26/2020  . Acute CVA (cerebrovascular accident) ([place]) 10/24/2020  . left MCA stroke 10/23/2020  . Meningioma ([place]) 10/23/2020  . Cerebral edema ([place]) 10/23/2020  . Global aphasia 10/23/2020  . Hypertensive urgency 10/23/2020    Carey Bullocks, OTR/L 01/10/2021, 2:59 PM  [facility] [address] [place], [place],  [address] Phone: [phone]   Fax:  [phone]  Name: Ahnesti Townsend MRN: [id number] Date of Birth: 12-29-1950

## 2021-01-11 ENCOUNTER — Ambulatory Visit (HOSPITAL_COMMUNITY)
Admission: RE | Admit: 2021-01-11 | Discharge: 2021-01-11 | Disposition: A | Discharge status: Home / Self Care | Payer: Medicare HMO | Source: Ambulatory Visit | Attending: Interventional Radiology | Admitting: Interventional Radiology

## 2021-01-11 ENCOUNTER — Encounter: Payer: Medicare HMO | Admitting: Occupational Therapy

## 2021-01-11 ENCOUNTER — Ambulatory Visit: Payer: Medicare HMO

## 2021-01-11 DIAGNOSIS — I6602 Occlusion and stenosis of left middle cerebral artery: Secondary | ICD-10-CM | POA: Diagnosis present

## 2021-01-11 MED ORDER — SODIUM CHLORIDE (PF) 0.9 % IJ SOLN
INTRAMUSCULAR | Status: AC
  Filled 2021-01-11: qty 50

## 2021-01-11 MED ORDER — IOHEXOL 350 MG/ML SOLN
75.0000 mL | Freq: Once | INTRAVENOUS | Status: AC | PRN
  Administered 2021-01-11: 75 mL via INTRAVENOUS

## 2021-01-12 ENCOUNTER — Telehealth (HOSPITAL_COMMUNITY): Payer: Self-pay

## 2021-01-12 NOTE — Telephone Encounter (Signed)
Pt agreed to f/u in 6 months with cta. AW  

## 2021-01-13 ENCOUNTER — Ambulatory Visit: Payer: Medicare HMO | Attending: Physician Assistant

## 2021-01-13 ENCOUNTER — Ambulatory Visit: Payer: Medicare HMO

## 2021-01-13 ENCOUNTER — Ambulatory Visit: Payer: Medicare HMO | Admitting: Occupational Therapy

## 2021-01-13 ENCOUNTER — Encounter: Payer: Self-pay | Admitting: Occupational Therapy

## 2021-01-13 ENCOUNTER — Other Ambulatory Visit: Payer: Self-pay

## 2021-01-13 DIAGNOSIS — R208 Other disturbances of skin sensation: Secondary | ICD-10-CM

## 2021-01-13 DIAGNOSIS — I69351 Hemiplegia and hemiparesis following cerebral infarction affecting right dominant side: Secondary | ICD-10-CM | POA: Insufficient documentation

## 2021-01-13 DIAGNOSIS — I69318 Other symptoms and signs involving cognitive functions following cerebral infarction: Secondary | ICD-10-CM

## 2021-01-13 DIAGNOSIS — R41841 Cognitive communication deficit: Secondary | ICD-10-CM | POA: Diagnosis present

## 2021-01-13 DIAGNOSIS — R2681 Unsteadiness on feet: Secondary | ICD-10-CM | POA: Diagnosis present

## 2021-01-13 DIAGNOSIS — M6281 Muscle weakness (generalized): Secondary | ICD-10-CM | POA: Insufficient documentation

## 2021-01-13 DIAGNOSIS — R482 Apraxia: Secondary | ICD-10-CM | POA: Diagnosis present

## 2021-01-13 DIAGNOSIS — R1312 Dysphagia, oropharyngeal phase: Secondary | ICD-10-CM | POA: Diagnosis present

## 2021-01-13 DIAGNOSIS — R2689 Other abnormalities of gait and mobility: Secondary | ICD-10-CM | POA: Insufficient documentation

## 2021-01-13 DIAGNOSIS — R4701 Aphasia: Secondary | ICD-10-CM | POA: Insufficient documentation

## 2021-01-13 NOTE — Therapy (Signed)
[address] [place], [place], [address] Phone: [phone]   Fax:  [phone]  Speech Language Pathology Treatment  Patient Details  Name: Brittany Griffith MRN: [id number] Date of Birth: Jan 01, 1951 Referring Provider (SLP): Cathlyn Parsons, PA-C   Encounter Date: 01/13/2021   End of Session - 01/13/21 1150    Visit Number 11    Number of Visits 17    Date for SLP Re-Evaluation 02/27/21    Authorization Type [employer]    SLP Start Time 0973    SLP Stop Time  1100    SLP Time Calculation (min) 45 min    Activity Tolerance Patient tolerated treatment well           Past Medical History:  Diagnosis Date  . Hypercholesteremia   . Hypertension   . Osteopenia   . Stroke [facility])     Past Surgical History:  Procedure Laterality Date  . IR ANGIO INTRA EXTRACRAN SEL COM CAROTID INNOMINATE UNI R MOD SED  10/26/2020  . IR INTRA CRAN STENT  10/26/2020  . IR RADIOLOGIST EVAL & MGMT  12/09/2020  . RADIOLOGY WITH ANESTHESIA Left 10/26/2020   Procedure: RADIOLOGY WITH ANESTHESIA LEFT MCA STENT;  Surgeon: Luanne Bras, MD;  Location: [facility];  Service: Radiology;  Laterality: Left;    There were no vitals filed for this visit.          ADULT SLP TREATMENT - 01/13/21 0001      General Information   Behavior/Cognition Alert;Pleasant mood;Requires cueing;Doesn't follow directions      Treatment Provided   Treatment provided Cognitive-Linquistic      Cognitive-Linquistic Treatment   Treatment focused on Apraxia;Patient/family/caregiver education    Skilled Treatment Lingraph[employer] trial device consultation completed this session, with caregiver education provided on how to use and customize device. SLP and consultant answered any family questions re: operating trial device. Pt and family aware of need to bring trial device back to all ST sessions for further customization and troubleshooting to  maximize trial period. Customization paperwork provided to family again this session. Consultant expecting completed form next week to aid customization of device. SLP provided additional modeling of how to utilize device until next session.      Assessment / Recommendations / Plan   Plan Continue with current plan of care      Progression Toward Goals   Progression toward goals Progressing toward goals            SLP Education - 01/13/21 1149    Education Details [employer] consultation, caregiver education re: operating and optimizing device    Person(s) Educated Patient;Spouse;Caregiver(s)    Methods Explanation;Demonstration;Verbal cues    Comprehension Verbalized understanding;Returned demonstration;Need further instruction;Verbal cues required            SLP Short Term Goals - 01/11/21 0842      SLP SHORT TERM GOAL #1   Title Pt will use mulitmodal communication (gesture, draw, write 1st letter etc) to augment verbal expression of basic wants/needs with usual mod A for 3 sessions    Baseline 12-30-20    Period --   or 9 visits for all STGs   Status Partially Met      SLP [place] #2   Title Pt's caregivers will appropriately cue patient and use alternative communication means when needed with occasional min A from SLP over 2 sessions    Status Not Met      SLP  SHORT TERM GOAL #3   Title Pt will approximate one-word personally relevant responses to supplement alternatives means of communication with usual mod A over 2 sessions    Status Not Met      SLP SHORT TERM GOAL #4   Title Pt will ID object in field of 4 to communicate wants/needs or demonstrate understanding with 75% accuracy given occasional min A over 3 sessions    Baseline 12-30-20 (family)    Status Partially Met      SLP SHORT TERM GOAL #5   Title Pt will tolerate trials of dysphagia 3/mechanical soft consistency with no overt s/sx of aspiration given occasional min A over 3 sessions    Status  Unable to assess            SLP Long Term Goals - 01/13/21 1150      SLP LONG TERM GOAL #1   Title Pt will use mulitmodal communication (gesture, draw, write 1st letter etc) to augment verbal expression to meet needs at home with occasional mod A from family.    Time 3    Period Weeks   or 17 visits for all LTGs   Status On-going      SLP LONG TERM GOAL #2   Title Pt will correctly ID item in field of 6 to communicate wants/needs with 75% accuracy given occasional min A over 2 sessions    Time 3    Period Weeks    Status On-going      SLP LONG TERM GOAL #3   Title Pt will approximate 3 personally relevant one word responses to supplement alternatives means of communication with usual mod A over 2 sessions    Time 3    Period Weeks    Status On-going      SLP LONG TERM GOAL #4   Title Caregivers will report improvements in communication effectiveness via QOL scale by last ST session    Time 3    Period Weeks    Status On-going      SLP LONG TERM GOAL #5   Title Pt will tolerate safest and least restricitive diet with no overt s/sx of aspiration reported or exhibited over 3 sessions    Time 3    Period Weeks    Status On-going            Plan - 01/13/21 1150    Clinical Impression Statement Brittany Griffith was referred for OPST intervention to address severe verbal apraxia, severe expressive/mod receptive aphasia, and 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 [employer]raph[employer] consultation completed this session for trial device. SLP provided education re: how to use, optimize, and personalize device. Family aware of need to bring device back to every ST session for further analysis.  See "skilled intervention" for more details.  Due to severity of apraxia, aphasia, and persistent dysphagia secondary to oral motor apraxia, SLP recommends skilled ST intervention to improve efficiency and accuracy of communication of basic  wants/needs and to improve swallow function to reduce risk of aspiration and optimize QOL.    Speech Therapy Frequency 2x / week    Duration 8 weeks   or 17 total visits   Treatment/Interventions Aspiration precaution training;Trials of upgraded texture/liquids;Oral motor exercises;Compensatory strategies;Pharyngeal strengthening exercises;Cueing hierarchy;Functional tasks;Patient/family education;Diet toleration management by SLP;Environmental controls;Cognitive reorganization;Multimodal communcation approach;Language facilitation;Compensatory techniques;Internal/external aids;SLP instruction and feedback    Potential to Achieve Goals Fair    Potential Considerations Severity of  impairments;Co-morbidities;Ability to learn/carryover information    SLP Home Exercise Plan provided    Consulted and Agree with Plan of Care Patient;Family member/caregiver           Patient will benefit from skilled therapeutic intervention in order to improve the following deficits and impairments:   Verbal apraxia  Aphasia  Cognitive communication deficit    Problem List Patient Active Problem List   Diagnosis Date Noted  . Right hemiplegia ([place])   . Urinary retention   . Hyperglycemia   . Essential hypertension   . Dysphagia, post-stroke   . 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 Left middle cerebral artery stroke ([employer]) 11/01/2020  . Dysphagia 11/01/2020  . Middle cerebral artery stenosis, left 10/26/2020  . Acute CVA (cerebrovascular accident) ([place]) 10/24/2020  . left MCA stroke 10/23/2020  . Meningioma ([place]) 10/23/2020  . Cerebral edema ([place]) 10/23/2020  . Global aphasia 10/23/2020  . Hypertensive urgency 10/23/2020    Alinda Deem, MA CCC-SLP 01/13/2021, 11:52 AM  [facility] [address] [place], [place], [address] Phone: [phone]   Fax:  [phone]   Name: Kaeleen Odom MRN: [id number] Date of Birth: June 22, 1951

## 2021-01-13 NOTE — Therapy (Signed)
[facility] [address] [place], [place], [address] Phone: [phone]   Fax:  [phone]  Occupational Therapy Treatment/10th Visit PN  Patient Details  Name: Brittany Griffith MRN: [id number] Date of Birth: [birth year] No data recorded  Encounter Date: 01/13/2021   OT End of Session - 01/13/21 0945    Visit Number 10    Number of Visits 25    Date for OT Re-Evaluation 03/06/21    Authorization Type [employer] MCR    Authorization - Visit Number 10    Authorization - Number of Visits 10    Progress Note Due on Visit 45    OT Start Time 2411   pt arrived late   OT Stop Time 1015    OT Time Calculation (min) 30 min    Activity Tolerance Patient tolerated treatment well    Behavior During Therapy [facility] for tasks assessed/performed           Past Medical History:  Diagnosis Date  . Hypercholesteremia   . Hypertension   . Osteopenia   . Stroke [facility])     Past Surgical History:  Procedure Laterality Date  . IR ANGIO INTRA EXTRACRAN SEL COM CAROTID INNOMINATE UNI R MOD SED  10/26/2020  . IR INTRA CRAN STENT  10/26/2020  . IR RADIOLOGIST EVAL & MGMT  12/09/2020  . RADIOLOGY WITH ANESTHESIA Left 10/26/2020   Procedure: RADIOLOGY WITH ANESTHESIA LEFT MCA STENT;  Surgeon: Luanne Bras, MD;  Location: [facility];  Service: Radiology;  Laterality: Left;    There were no vitals filed for this visit.   Subjective Assessment - 01/13/21 1009    Subjective  Pt reports no pain. Reports decrease in pain since injection.    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 PMH: HTN, HLD, Meningioma    Limitations fall risk, aphasic,    Currently in Pain? No/denies            Checked goals for progress note.  UE Ranger in sidelying while laying on left side and using RUE on UE ranger for active movement in all planes and emphasis on  controlled movements and holding isometrically.   ADLs donning and doffing socks and shoes in BLE with mod I.   Occupational Therapy Progress Note  Dates of Reporting Period: 12/07/20 to 01/13/21  Objective Reports of Subjective Statement: Pt reporting doing more with ADLs at home.  Objective Measurements: Pt has demonstrated improvements with overall active movement in RUE in gravity eliminated planes. Pt is making progress with increasing independence with ADLs.  Goal Update: Pt as met 4/6 STGs with one deferral at this time and one still ongoing.  Plan: Skilled OT is recommended to continue to work on unmet goals.  Reason Skilled Services are Required: For Neuromuscular Reeducation in RUE and increasing independence with ADLs.                    OT Short Term Goals - 01/13/21 0945      OT SHORT TERM GOAL #1   Title Pt/family Independent with initial HEP    Time 4    Period Weeks    Status Achieved      OT SHORT TERM GOAL #2   Title Pt/family independent with splint wear and care prn    Time 4    Period Weeks    Status Deferred  deferred at this time d/t no splints or braces issued. Will reinstate if needed 01/13/21     OT SHORT TERM GOAL #3   Title Pt to cut food w/ AE prn    Time 4    Period Weeks    Status Achieved   met in clinic with A/E     OT SHORT TERM GOAL #4   Title Pt/family to id A/E and one handed techniques to assist with BADLs (dressing, bathing, grooming, toileting)    Time 4    Period Weeks    Status Achieved      OT SHORT TERM GOAL #5   Title Pt to demo 25* Rt shoulder flexion in prep for low level reaching    Time 4    Period Weeks    Status On-going   25* with chest elevation presents 01/13/21     OT SHORT TERM GOAL #6   Title Pt to demo 25% finger flexion and 10% finger extension in prep for grasping/releasing Rt hand    Time 4    Period Weeks    Status Achieved   25% finger flexion and approx 10% extension 01/13/21             OT Long Term Goals - 01/13/21 0948      OT LONG TERM GOAL #1   Title Pt to be independent with updated HEP    Time 12    Period Weeks    Status On-going      OT LONG TERM GOAL #2   Title Pt to be mod I with all BADLS using A/E prn (grooming, dressing, bathing, and toileting)    Time 12    Period Weeks    Status On-going      OT LONG TERM GOAL #3   Title Pt to prepare simple snack, sandwich, and/or microwaveable items mod I level    Time 12    Period Weeks    Status New      OT LONG TERM GOAL #4   Title Pt to perform RUE reaching in low ranges to grasp and release 1-2" objects consistently    Time 12    Period Weeks    Status New      OT LONG TERM GOAL #5   Title Pt to use RUE as min assist for BADLS and bilateral tasks    Time 12    Period Weeks    Status New      OT LONG TERM GOAL #6   Title Pt to improve RUE function as evidenced by performing 6 blocks on Box & Blocks test    Baseline unable, no functional movement    Time 12    Period Weeks    Status New                 Plan - 01/13/21 1030    Clinical Impression Statement Progress note for 10th visit and for time period 12/07/20 - 01/13/21. Pt has met 4/6 STGs and is working towards unmet goals. Skilled OT continues to be beneficial for patient at this time.    OT Occupational Profile and History Detailed Assessment- Review of Records and additional review of physical, cognitive, psychosocial history related to current functional performance    Occupational performance deficits (Please refer to evaluation for details): ADL's;IADL's;Social Participation;Work;Leisure    Body Structure / Function / Physical Skills ADL;Strength;Pain;Body mechanics;Edema;Proprioception;UE functional use;IADL;ROM;Coordination;Mobility;Sensation;FMC    Cognitive Skills Attention;Understand;Perception    Rehab Potential Good  Clinical Decision Making Several treatment options, min-mod task modification necessary     Comorbidities Affecting Occupational Performance: May have comorbidities impacting occupational performance    Modification or Assistance to Complete Evaluation  Min-Moderate modification of tasks or assist with assess necessary to complete eval    OT Frequency 2x / week    OT Duration 12 weeks   plus eval   OT Treatment/Interventions Self-care/ADL training;Moist Heat;DME and/or AE instruction;Splinting;Therapeutic activities;Therapeutic exercise;Cognitive remediation/compensation;Coping strategies training;Neuromuscular education;Functional Mobility Training;Passive range of motion;Visual/perceptual remediation/compensation;Patient/family education;Manual Therapy;Electrical Stimulation;Energy conservation    Plan continue NMR, estim for finger and wrist extension    Consulted and Agree with Plan of Care Patient;Family member/caregiver    Family Member Consulted Husband and son           Patient will benefit from skilled therapeutic intervention in order to improve the following deficits and impairments:   Body Structure / Function / Physical Skills: ADL,Strength,Pain,Body mechanics,Edema,Proprioception,UE functional use,IADL,ROM,Coordination,Mobility,Sensation,FMC Cognitive Skills: Attention,Understand,Perception     Visit Diagnosis: Hemiplegia and hemiparesis following cerebral infarction affecting right dominant side (HCC)  Muscle weakness (generalized)  Other abnormalities of gait and mobility  Unsteadiness on feet  Other symptoms and signs involving cognitive functions following cerebral infarction  Other disturbances of skin sensation    Problem List Patient Active Problem List   Diagnosis Date Noted  . Right hemiplegia ([place])   . Urinary retention   . Hyperglycemia   . Essential hypertension   . Dysphagia, post-stroke   . 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 Dysphagia  11/01/2020  . Middle cerebral artery stenosis, left 10/26/2020  . Acute CVA (cerebrovascular accident) ([place]) 10/24/2020  . left MCA stroke 10/23/2020  . Meningioma ([place]) 10/23/2020  . Cerebral edema ([place]) 10/23/2020  . Global aphasia 10/23/2020  . Hypertensive urgency 10/23/2020    Zachery Conch MOT, OTR/L  01/13/2021, 10:31 AM  [facility] [address] [place], [place], [address] Phone: [phone]   Fax:  [phone]  Name: Brittany Griffith MRN: [id number] Date of Birth: [birth year]

## 2021-01-17 ENCOUNTER — Other Ambulatory Visit: Payer: Self-pay

## 2021-01-17 ENCOUNTER — Ambulatory Visit: Payer: Medicare HMO

## 2021-01-17 ENCOUNTER — Ambulatory Visit: Payer: Medicare HMO | Admitting: Occupational Therapy

## 2021-01-17 DIAGNOSIS — R4701 Aphasia: Secondary | ICD-10-CM

## 2021-01-17 DIAGNOSIS — M6281 Muscle weakness (generalized): Secondary | ICD-10-CM

## 2021-01-17 DIAGNOSIS — R2681 Unsteadiness on feet: Secondary | ICD-10-CM

## 2021-01-17 DIAGNOSIS — I69351 Hemiplegia and hemiparesis following cerebral infarction affecting right dominant side: Secondary | ICD-10-CM

## 2021-01-17 DIAGNOSIS — R1312 Dysphagia, oropharyngeal phase: Secondary | ICD-10-CM

## 2021-01-17 DIAGNOSIS — R482 Apraxia: Secondary | ICD-10-CM

## 2021-01-17 DIAGNOSIS — R2689 Other abnormalities of gait and mobility: Secondary | ICD-10-CM

## 2021-01-17 DIAGNOSIS — R41841 Cognitive communication deficit: Secondary | ICD-10-CM

## 2021-01-17 NOTE — Therapy (Signed)
[facility] [address] [place], [place], [address] Phone: [phone]   Fax:  [phone]  Physical Therapy Treatment  Patient Details  Name: Brittany Griffith MRN: [id number] Date of Birth: [birth year] Referring Provider (PT): Lauraine Rinne, [place]   Encounter Date: 01/17/2021   PT End of Session - 01/17/21 1534    Visit Number 12    Number of Visits 21    Date for PT Re-Evaluation 02/07/21    Authorization Type [employer]; $30 copay per day for all disciplines, Eval /19/22, 10 visit PN 01/06/21    Progress Note Due on Visit 20    Equipment Utilized During Treatment Gait belt    Activity Tolerance Patient tolerated treatment well    Behavior During Therapy [facility] for tasks assessed/performed           Past Medical History:  Diagnosis Date  . Hypercholesteremia   . Hypertension   . Osteopenia   . Stroke [facility])     Past Surgical History:  Procedure Laterality Date  . IR ANGIO INTRA EXTRACRAN SEL COM CAROTID INNOMINATE UNI R MOD SED  10/26/2020  . IR INTRA CRAN STENT  10/26/2020  . IR RADIOLOGIST EVAL & MGMT  12/09/2020  . RADIOLOGY WITH ANESTHESIA Left 10/26/2020   Procedure: RADIOLOGY WITH ANESTHESIA LEFT MCA STENT;  Surgeon: Luanne Bras, MD;  Location: [place];  Service: Radiology;  Laterality: Left;    There were no vitals filed for this visit.   Subjective Assessment - 01/17/21 1759    Subjective rates er mobility and balance with ADLs at 90%, confirmed with CGs, feels able to decrease PT to 1x/week    Patient is accompained by: Family member    Limitations Sitting;Lifting;Standing;Walking;House hold activities    How long can you sit comfortably? no issue    How long can you stand comfortably? She can't stand    How long can you walk comfortably? She can walk with family with 25% assistance with waling for 5-10 fet at home    Patient Stated Goals Get stroonger, walk better, improve indepdnence.               [facility] PT Assessment - 01/17/21 0001      Standardized Balance Assessment   Five times sit to stand comments  10.41s      Dynamic Gait Index   Level Surface Normal    Change in Gait Speed Normal    Gait with Horizontal Head Turns Normal    Gait with Vertical Head Turns Normal    Gait and Pivot Turn Normal    Step Over Obstacle Normal    Step Around Obstacles Normal    Steps Mild Impairment    Total Score 23    DGI comment: some tasks required repeat testing due to language barrier as well as cognitive deficits      Timed Up and Go Test   TUG Normal TUG    Normal TUG (seconds) 10.88    TUG Comments best of 2 trials                         [facility] Adult PT Treatment/Exercise - 01/17/21 0001      Transfers   Transfers Sit to Stand    Sit to Stand 7: Independent    Stand to Sit 7: Independent      Ambulation/Gait   Ambulation/Gait Yes    Ambulation/Gait Assistance 6: Modified independent (Device/Increase  time)    Ambulation Distance (Feet) 1000 Feet    Assistive device None    Gait Pattern Step-through pattern;Decreased arm swing - right    Gait Comments performed advanced balance challenges of fig 8s b/t poles, reaching OH on up and down sloped surfaces, head turns/nods/random movements, pivot turns, stop/start w/o evidence of balance loss      Knee/Hip Exercises: Aerobic   Other Aerobic Scifit L3 10' no arm                  PT Education - 01/17/21 1802    Education Details Per patient and CGs, PT will decrease to 1x/week    Person(s) Educated Patient;Spouse;Caregiver(s)    Methods Explanation    Comprehension Verbalized understanding            PT Short Term Goals - 01/06/21 1407      PT SHORT TERM GOAL #1   Title pt will be able to ambulate 300' with quad cane with CGA and without evidence of LOB to improve short distance ambulation.    Baseline 115' quad cane CGA-MinA for occaisonal LOB (11/29/20); ambulated 1050' total without  AD, >300 feet in grass with up hill and down hill (12/30/20)    Time 5    Period Weeks    Status Achieved    Target Date 01/03/21      PT SHORT TERM GOAL #2   Title Pt will be able to go up and down12 steps with use of 1 rail and CGA to improve stair negotiation in community    Baseline not tried (11/29/20); 12 steps with using one rail and reciprocal steps (12/30/20)    Time 5    Period Weeks    Status Achieved    Target Date 01/03/21      PT SHORT TERM GOAL #3   Title Pt and family will demo compliance with at least 5 days a week walking program to improve walking endurance and practice gait.    Baseline Household ambulator only (11/29/20); 10-15 min walking (12/30/20)    Time 5    Period Weeks    Status Achieved    Target Date 01/03/21             PT Long Term Goals - 01/06/21 1407      PT LONG TERM GOAL #1   Title Pt will be able to ambulate on non compliant surface for 200 feet with appropriate AD with SBA to improve community negotiation.    Baseline not attempted (eval); >300 feet with SBA/CGA for safety    Time 10    Period Weeks    Status On-going      PT LONG TERM GOAL #2   Title Pt will be able to ambulate 1050' with appropriate AD with SBA to improve community ambulation    Baseline 115' with quad cane CGA to min A (11/29/20); 1050' requires CGA for occaisonal imbalance (12/30/20)    Time 10    Period Weeks    Status On-going      PT LONG TERM GOAL #3   Title Pt will demo 0.24m/s improvement on 10 meter walk test to improve walking speed and improve community ambulation    Baseline Not assessed on eval (11/29/20); 0.55m/s without AD (12/30/20)    Time 10    Period Weeks    Status On-going      PT LONG TERM GOAL #4   Title Pt will demo 4 points improvement on BBS  to reduce fall risk    Baseline not assessed on eval (11/29/20); 17/56 (12/14/20); 52/56 (12/30/20)    Time 10    Period Weeks    Status Achieved                 Plan - 01/17/21 1609     Clinical Impression Statement Todays session focused on high level balance challenges during ambulation, aerobic conditioning LEs only, performance of functional testing including, TUG, 5x STS and DGI    Personal Factors and Comorbidities Age;Behavior Pattern;Past/Current Experience;Time since onset of injury/illness/exacerbation;Transportation    Examination-Activity Limitations Bathing;Bed Mobility;Bend;Caring for Others;Carry;Dressing;Hygiene/Grooming;Lift;Sleep;Squat;Stairs;Stand;Toileting;Transfers    Examination-Participation Restrictions Cleaning;Community Activity;Driving;Laundry;Medication Management;Meal Prep;Personal Finances;Shop;Yard Work    [employer] Evolving/Moderate complexity    Rehab Potential Good    PT Frequency 2x / week    PT Duration Other (comment)   10 weeks   PT Treatment/Interventions ADLs/Self Care Home Management;Moist Heat;Gait training;Stair training;Functional mobility training;Therapeutic activities;Therapeutic exercise;Balance training;Neuromuscular re-education;Cognitive remediation;Patient/family education;Orthotic Fit/Training;Manual techniques;Passive range of motion;Energy conservation;Visual/perceptual remediation/compensation;Joint Manipulations    PT Next Visit Plan continue dual tasking with gait, endurance training    PT Home Exercise Plan Access Code [email or web address]     Consulted and Agree with Plan of Care Patient;Family member/caregiver    Family Member Consulted brother and spouse           Patient will benefit from skilled therapeutic intervention in order to improve the following deficits and impairments:  Abnormal gait,Decreased activity tolerance,Decreased cognition,Decreased balance,Decreased coordination,Decreased safety awareness,Decreased range of motion,Decreased mobility,Decreased knowledge of use of DME,Decreased knowledge of precautions,Decreased endurance,Decreased strength,Difficulty walking,Impaired flexibility,Impaired  perceived functional ability,Increased fascial restricitons,Impaired sensation,Impaired tone,Impaired UE functional use,Postural dysfunction,Pain  Visit Diagnosis: Hemiplegia and hemiparesis following cerebral infarction affecting right dominant side ([place])  Muscle weakness (generalized)  Other abnormalities of gait and mobility  Unsteadiness on feet     Problem List Patient Active Problem List   Diagnosis Date Noted  . Right hemiplegia ([place])   . Urinary retention   . Hyperglycemia   . Essential hypertension   . Dysphagia, post-stroke   . 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 Dysphagia 11/01/2020  . Middle cerebral artery stenosis, left 10/26/2020  . Acute CVA (cerebrovascular accident) ([place]) 10/24/2020  . left MCA stroke 10/23/2020  . Meningioma ([place]) 10/23/2020  . Cerebral edema ([place]) 10/23/2020  . Global aphasia 10/23/2020  . Hypertensive urgency 10/23/2020    [name] PT 01/17/2021, 6:06 PM  [facility] [address] [place], [place], [address] Phone: [phone]   Fax:  [phone]  Name: Brittany Griffith MRN: [id number] Date of Birth: 11/11/1950

## 2021-01-17 NOTE — Therapy (Signed)
[facility] [address] [place], [place], [address] Phone: [phone]   Fax:  [phone]  Occupational Therapy Treatment  Patient Details  Name: Brittany Griffith MRN: [id number] Date of Birth: May 07, 1951 No data recorded  Encounter Date: 01/17/2021   OT End of Session - 01/17/21 1436    Visit Number 11    Number of Visits 25    Date for OT Re-Evaluation 03/06/21    Authorization Type [employer] MCR    Authorization - Visit Number 11    Authorization - Number of Visits 20    Progress Note Due on Visit 20    OT Start Time 1405    OT Stop Time 1445    OT Time Calculation (min) 40 min    Activity Tolerance Patient tolerated treatment well    Behavior During Therapy [facility] for tasks assessed/performed           Past Medical History:  Diagnosis Date  . Hypercholesteremia   . Hypertension   . Osteopenia   . Stroke [facility])     Past Surgical History:  Procedure Laterality Date  . IR ANGIO INTRA EXTRACRAN SEL COM CAROTID INNOMINATE UNI R MOD SED  10/26/2020  . IR INTRA CRAN STENT  10/26/2020  . IR RADIOLOGIST EVAL & MGMT  12/09/2020  . RADIOLOGY WITH ANESTHESIA Left 10/26/2020   Procedure: RADIOLOGY WITH ANESTHESIA LEFT MCA STENT;  Surgeon: Luanne Bras, MD;  Location: [place];  Service: Radiology;  Laterality: Left;    There were no vitals filed for this visit.   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 PMH: HTN, HLD, Meningioma    Limitations fall risk, aphasic,    Currently in Pain? No/denies           AA/ROM into gravity low range and gravity elim planes w/ pool noodle BUE's followed by AA/ROM RUE only using UE Ranger.  Wt bearing over BUE's in squat position with weight shifts A/P and side to side. Closed  chain sh flexion in mid range w/ mod to max assist to maintain activation.  Pt able to demo approx 50% finger flexion and 20% finger extension.   NMES x 10 min for wrist/finger extensors - previous parameters.                        OT Short Term Goals - 01/13/21 0945      OT SHORT TERM GOAL #1   Title Pt/family Independent with initial HEP    Time 4    Period Weeks    Status Achieved      OT SHORT TERM GOAL #2   Title Pt/family independent with splint wear and care prn    Time 4    Period Weeks    Status Deferred   deferred at this time d/t no splints or braces issued. Will reinstate if needed 01/13/21     OT SHORT TERM GOAL #3   Title Pt to cut food w/ AE prn    Time 4    Period Weeks    Status Achieved   met in clinic with A/E     OT SHORT TERM GOAL #4   Title Pt/family to id A/E and one handed techniques to assist with  BADLs (dressing, bathing, grooming, toileting)    Time 4    Period Weeks    Status Achieved      OT SHORT TERM GOAL #5   Title Pt to demo 25* Rt shoulder flexion in prep for low level reaching    Time 4    Period Weeks    Status On-going   25* with chest elevation presents 01/13/21     OT SHORT TERM GOAL #6   Title Pt to demo 25% finger flexion and 10% finger extension in prep for grasping/releasing Rt hand    Time 4    Period Weeks    Status Achieved   25% finger flexion and approx 10% extension 01/13/21            OT Long Term Goals - 01/13/21 0948      OT LONG TERM GOAL #1   Title Pt to be independent with updated HEP    Time 12    Period Weeks    Status On-going      OT LONG TERM GOAL #2   Title Pt to be mod I with all BADLS using A/E prn (grooming, dressing, bathing, and toileting)    Time 12    Period Weeks    Status On-going      OT LONG TERM GOAL #3   Title Pt to prepare simple snack, sandwich, and/or microwaveable items mod I level    Time 12    Period Weeks    Status New      OT LONG TERM GOAL #4   Title  Pt to perform RUE reaching in low ranges to grasp and release 1-2" objects consistently    Time 12    Period Weeks    Status New      OT LONG TERM GOAL #5   Title Pt to use RUE as min assist for BADLS and bilateral tasks    Time 12    Period Weeks    Status New      OT LONG TERM GOAL #6   Title Pt to improve RUE function as evidenced by performing 6 blocks on Box & Blocks test    Baseline unable, no functional movement    Time 12    Period Weeks    Status New                 Plan - 01/17/21 1436    Clinical Impression Statement Pt continues to make progress RUE during low range closed chain and AA/ROM activities. Pt limited by aphasia    OT Occupational Profile and History Detailed Assessment- Review of Records and additional review of physical, cognitive, psychosocial history related to current functional performance    Occupational performance deficits (Please refer to evaluation for details): ADL's;IADL's;Social Participation;Work;Leisure    Body Structure / Function / Physical Skills ADL;Strength;Pain;Body mechanics;Edema;Proprioception;UE functional use;IADL;ROM;Coordination;Mobility;Sensation;FMC    Cognitive Skills Attention;Understand;Perception    Rehab Potential Good    Clinical Decision Making Several treatment options, min-mod task modification necessary    Comorbidities Affecting Occupational Performance: May have comorbidities impacting occupational performance    Modification or Assistance to Complete Evaluation  Min-Moderate modification of tasks or assist with assess necessary to complete eval    OT Frequency 2x / week    OT Duration 12 weeks   plus eval   OT Treatment/Interventions Self-care/ADL training;Moist Heat;DME and/or AE instruction;Splinting;Therapeutic activities;Therapeutic exercise;Cognitive remediation/compensation;Coping strategies training;Neuromuscular education;Functional Mobility Training;Passive range of motion;Visual/perceptual  remediation/compensation;Patient/family education;Manual Therapy;Electrical Stimulation;Energy conservation  Plan continue NMR, estim for finger and wrist extension    Consulted and Agree with Plan of Care Patient;Family member/caregiver    Family Member Consulted Husband and son           Patient will benefit from skilled therapeutic intervention in order to improve the following deficits and impairments:   Body Structure / Function / Physical Skills: ADL,Strength,Pain,Body mechanics,Edema,Proprioception,UE functional use,IADL,ROM,Coordination,Mobility,Sensation,FMC Cognitive Skills: Attention,Understand,Perception     Visit Diagnosis: Hemiplegia and hemiparesis following cerebral infarction affecting right dominant side ([facility])  Muscle weakness (generalized)  Unsteadiness on feet    Problem List Patient Active Problem List   Diagnosis Date Noted  . Right hemiplegia ([place])   . Urinary retention   . Hyperglycemia   . Essential hypertension   . Dysphagia, post-stroke   . 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 Dysphagia 11/01/2020  . Middle cerebral artery stenosis, left 10/26/2020  . Acute CVA (cerebrovascular accident) ([place]) 10/24/2020  . left MCA stroke 10/23/2020  . Meningioma ([place]) 10/23/2020  . Cerebral edema ([place]) 10/23/2020  . Global aphasia 10/23/2020  . Hypertensive urgency 10/23/2020    Carey Bullocks, OTR/L 01/17/2021, 2:42 PM  [facility] [address] [place], [place], [address] Phone: [phone]   Fax:  [phone]  Name: Brittany Griffith MRN: [id number] Date of Birth: [birth year]

## 2021-01-18 ENCOUNTER — Encounter: Payer: Medicare HMO | Admitting: Occupational Therapy

## 2021-01-18 ENCOUNTER — Ambulatory Visit: Payer: Medicare HMO

## 2021-01-18 NOTE — Therapy (Signed)
[facility] [address] [place], [place], [address] Phone: [phone]   Fax:  [phone]  Speech Language Pathology Treatment  Patient Details  Name: Brittany Griffith MRN: [id number] Date of Birth: May 12, 1951 Referring Provider (SLP): Brittany Parsons, PA-C   Encounter Date: 01/17/2021   End of Session - 01/18/21 0836    Visit Number 12    Number of Visits 17    Date for SLP Re-Evaluation 02/27/21    Authorization Type A[employer] Medicare    SLP Start Time 1450    SLP Stop Time  1530    SLP Time Calculation (min) 40 min    Activity Tolerance Patient tolerated treatment well           Past Medical History:  Diagnosis Date  . Hypercholesteremia   . Hypertension   . Osteopenia   . Stroke [facility])     Past Surgical History:  Procedure Laterality Date  . IR ANGIO INTRA EXTRACRAN SEL COM CAROTID INNOMINATE UNI R MOD SED  10/26/2020  . IR INTRA CRAN STENT  10/26/2020  . IR RADIOLOGIST EVAL & MGMT  12/09/2020  . RADIOLOGY WITH ANESTHESIA Left 10/26/2020   Procedure: RADIOLOGY WITH ANESTHESIA LEFT MCA STENT;  Surgeon: Luanne Bras, MD;  Location: [place];  Service: Radiology;  Laterality: Left;    There were no vitals filed for this visit.   Subjective Assessment - 01/18/21 0828    Subjective No "s"    Patient is accompained by: Family member   Brittany Griffith-husband   Currently in Pain? No/denies                 ADULT SLP TREATMENT - 01/18/21 0829      General Information   Behavior/Cognition Alert;Pleasant mood;Requires cueing;Doesn't follow directions      Treatment Provided   Treatment provided Cognitive-Linquistic      Cognitive-Linquistic Treatment   Treatment focused on Apraxia;Aphasia;Patient/family/caregiver education    Skilled Treatment Lingraphica device education provided on how to use and customize device for pthusband. Brittany Griffith stated pt was occasionally, spontaneously using the device when it  was placed in front of her. Customization paperwork was reviewed with husband as Lucero could not respond verbally and has not had enough familiarization with the device to provide answers in a timely manner. SLP provided additional modeling of how to utilize device until next session. Brittany Griffith and pt were strongly encouraged to have pt access device whenever possible. Today, SLP changed display to have all icons placed on left as she missed 75% of responses when the target icon was on rt. SLP moved device to slightly left of center and this appeared to also help pt. With encourgement to repeat device labeling of words pt was not successful x3, with max cues from SLP. Pt required multiple cues and repeats in Nepali for simple requests/questions about icons showing on device screen.Near end of session husband demonstrated knowledge of some basic programming.      Assessment / Recommendations / Plan   Plan Continue with current plan of care      Progression Toward Goals   Progression toward goals Progressing toward goals            SLP Education - 01/18/21 0835    Education Details basic Lingraphica programming and usage    Person(s) Educated Patient;Spouse    Methods Explanation;Demonstration;Verbal cues    Comprehension Verbalized understanding;Returned demonstration;Verbal cues required;Need further instruction  SLP Short Term Goals - 01/11/21 0842      SLP SHORT TERM GOAL #1   Title Pt will use mulitmodal communication (gesture, draw, write 1st letter etc) to augment verbal expression of basic wants/needs with usual mod A for 3 sessions    Baseline 12-30-20    Period --   or 9 visits for all STGs   Status Partially Met      SLP [place] #2   Title Pt's caregivers will appropriately cue patient and use alternative communication means when needed with occasional min A from SLP over 2 sessions    Status Not Met      SLP SHORT TERM GOAL #3   Title Pt will approximate  one-word personally relevant responses to supplement alternatives means of communication with usual mod A over 2 sessions    Status Not Met      SLP SHORT TERM GOAL #4   Title Pt will ID object in field of 4 to communicate wants/needs or demonstrate understanding with 75% accuracy given occasional min A over 3 sessions    Baseline 12-30-20 (family)    Status Partially Met      SLP SHORT TERM GOAL #5   Title Pt will tolerate trials of dysphagia 3/mechanical soft consistency with no overt s/sx of aspiration given occasional min A over 3 sessions    Status Unable to assess            SLP Long Term Goals - 01/18/21 1275      SLP LONG TERM GOAL #1   Title Pt will use mulitmodal communication (gesture, draw, write 1st letter etc) to augment verbal expression to meet needs at home with occasional mod A from family.    Period --   or 17 visits for all LTGs   Status Achieved   (pointing)     SLP LONG TERM GOAL #2   Title Pt will correctly ID item in field of 6 to communicate wants/needs with 75% accuracy given occasional min A over 2 sessions    Time 2    Period Weeks    Status On-going      SLP LONG TERM GOAL #3   Title Pt will approximate 3 personally relevant one word responses to supplement alternatives means of communication with usual mod A over 2 sessions    Time 3    Period Weeks    Status On-going      SLP LONG TERM GOAL #4   Title Caregivers will report improvements in communication effectiveness via QOL scale by last ST session    Time 2    Period Weeks    Status On-going      SLP LONG TERM GOAL #5   Title Pt will tolerate safest and least restricitive diet with no overt s/sx of aspiration reported or exhibited over 3 sessions    Time 2    Period Weeks    Status On-going            Plan - 01/18/21 0837    Clinical Impression Statement [name] was referred for OPST intervention to address severe verbal apraxia, severe expressive/mod receptive aphasia, and mild to mod  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 SLP provided ongoing education re: how to use, optimize, and personalize device. Family aware of need to bring device back to every ST session for further analysis.  See "skilled intervention" for more details.  Due to severity of  apraxia, aphasia, and persistent dysphagia secondary to oral motor apraxia, SLP recommends skilled ST intervention to improve efficiency and accuracy of communication of basic wants/needs and to improve swallow function to reduce risk of aspiration and optimize QOL.    Speech Therapy Frequency 2x / week    Duration 8 weeks   or 17 total visits   Treatment/Interventions Aspiration precaution training;Trials of upgraded texture/liquids;Oral motor exercises;Compensatory strategies;Pharyngeal strengthening exercises;Cueing hierarchy;Functional tasks;Patient/family education;Diet toleration management by SLP;Environmental controls;Cognitive reorganization;Multimodal communcation approach;Language facilitation;Compensatory techniques;Internal/external aids;SLP instruction and feedback    Potential to Achieve Goals Fair    Potential Considerations Severity of impairments;Co-morbidities;Ability to learn/carryover information    SLP Home Exercise Plan provided    Consulted and Agree with Plan of Care Patient;Family member/caregiver           Patient will benefit from skilled therapeutic intervention in order to improve the following deficits and impairments:   Verbal apraxia  Aphasia  Cognitive communication deficit  Dysphagia, oropharyngeal phase    Problem List Patient Active Problem List   Diagnosis Date Noted  . Right hemiplegia ([place])   . Urinary retention   . Hyperglycemia   . Essential hypertension   . Dysphagia, post-stroke   . 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 Left middle cerebral artery stroke ([place])  11/01/2020  . Dysphagia 11/01/2020  . Middle cerebral artery stenosis, left 10/26/2020  . Acute CVA (cerebrovascular accident) ([place]) 10/24/2020  . left MCA stroke 10/23/2020  . Meningioma ([place]) 10/23/2020  . Cerebral edema ([place]) 10/23/2020  . Global aphasia 10/23/2020  . Hypertensive urgency 10/23/2020    [facility], CCC-SLP  01/18/2021, 8:40 AM  [facility] [address] [place], [place], [address] Phone: [phone]   Fax:  [phone]   Name: Brittany Griffith MRN: [id number] Date of Birth: 06/03/1951

## 2021-01-18 NOTE — Patient Instructions (Signed)
  Have Shaunessy use device as much as possible - one suggestion is with all food choices.

## 2021-01-20 ENCOUNTER — Other Ambulatory Visit (HOSPITAL_COMMUNITY): Payer: Self-pay | Admitting: Student

## 2021-01-20 ENCOUNTER — Other Ambulatory Visit: Payer: Self-pay

## 2021-01-20 ENCOUNTER — Ambulatory Visit: Payer: Medicare HMO

## 2021-01-20 ENCOUNTER — Ambulatory Visit: Payer: Medicare HMO | Admitting: Occupational Therapy

## 2021-01-20 ENCOUNTER — Encounter: Payer: Self-pay | Admitting: Occupational Therapy

## 2021-01-20 DIAGNOSIS — R482 Apraxia: Secondary | ICD-10-CM

## 2021-01-20 DIAGNOSIS — I69351 Hemiplegia and hemiparesis following cerebral infarction affecting right dominant side: Secondary | ICD-10-CM

## 2021-01-20 DIAGNOSIS — R4701 Aphasia: Secondary | ICD-10-CM

## 2021-01-20 DIAGNOSIS — R2689 Other abnormalities of gait and mobility: Secondary | ICD-10-CM

## 2021-01-20 DIAGNOSIS — I63412 Cerebral infarction due to embolism of left middle cerebral artery: Secondary | ICD-10-CM

## 2021-01-20 DIAGNOSIS — R208 Other disturbances of skin sensation: Secondary | ICD-10-CM

## 2021-01-20 DIAGNOSIS — R2681 Unsteadiness on feet: Secondary | ICD-10-CM

## 2021-01-20 DIAGNOSIS — I69318 Other symptoms and signs involving cognitive functions following cerebral infarction: Secondary | ICD-10-CM

## 2021-01-20 MED ORDER — TICAGRELOR 90 MG PO TABS: 45.0000 mg | ORAL_TABLET | Freq: Two times a day (BID) | ORAL | 2 refills | Status: DC

## 2021-01-20 NOTE — Therapy (Signed)
[facility] [address] [place], [place], [address] Phone: [phone]   Fax:  [phone]  Speech Language Pathology Treatment  Patient Details  Name: Brittany Griffith MRN: [id number] Date of Birth: Oct 30, 1950 Referring Provider (SLP): Cathlyn Parsons, PA-C   Encounter Date: 01/20/2021   End of Session - 01/20/21 1241     Visit Number 13    Number of Visits 17    Date for SLP Re-Evaluation 02/27/21    Authorization Type Aetna Medicare    SLP Start Time 6283    SLP Stop Time  1230    SLP Time Calculation (min) 45 min    Activity Tolerance Patient tolerated treatment well             Past Medical History:  Diagnosis Date   Hypercholesteremia    Hypertension    Osteopenia    Stroke [facility])     Past Surgical History:  Procedure Laterality Date   IR ANGIO INTRA EXTRACRAN SEL COM CAROTID INNOMINATE UNI R MOD SED  10/26/2020   IR INTRA CRAN STENT  10/26/2020   IR RADIOLOGIST EVAL & MGMT  12/09/2020   RADIOLOGY WITH ANESTHESIA Left 10/26/2020   Procedure: RADIOLOGY WITH ANESTHESIA LEFT MCA STENT;  Surgeon: Luanne Bras, MD;  Location: [facility];  Service: Radiology;  Laterality: Left;    There were no vitals filed for this visit.   Subjective Assessment - 01/20/21 1237     Subjective "We have been practicing some"    Patient is accompained by: Family member   husband and brother   Currently in Pain? No/denies                   ADULT SLP TREATMENT - 01/20/21 1238       General Information   Behavior/Cognition Alert;Pleasant mood;Requires cueing;Doesn't follow directions      Treatment Provided   Treatment provided Cognitive-Linquistic      Cognitive-Linquistic Treatment   Treatment focused on Apraxia;Aphasia;Patient/family/caregiver education    Skilled Treatment SLP targeted expressive and receptive language via Lingraphica device. Previous SLP modified device for left-sided icons due  to right neglect. Usual fading to rare min A required to scroll down page for additional icons. Pt able to ID correct icon for personal information with 40% accuracy, with occasional grunt indicating some awareness of errors x2. With additional min verbal prompting, pt able to identify correct item. SLP added additional categories for "I want" and "I need." SLP provided training for pt requesting glasses on device, with good accuracy as SLP provided glasses when appropriate icons selected. SLP discussed with caregivers need for additional training to ensure comprehension, how to modify icons to personalize, and deleting icons that do not apply to patient to reduce number of icons.      Assessment / Recommendations / Plan   Plan Continue with current plan of care      Progression Toward Goals   Progression toward goals Progressing toward goals              SLP Education - 01/20/21 1241     Education Details Lingraphica trials, how to modify and simply for improved understanding and usage    Person(s) Educated Patient;Spouse;Caregiver(s)    Methods Explanation;Demonstration;Verbal cues;Tactile cues    Comprehension Verbalized understanding;Returned demonstration;Verbal cues required;Need further instruction;Tactile cues required              SLP Short Term Goals - 01/11/21 6629  SLP SHORT TERM GOAL #1   Title Pt will use mulitmodal communication (gesture, draw, write 1st letter etc) to augment verbal expression of basic wants/needs with usual mod A for 3 sessions    Baseline 12-30-20    Period --   or 9 visits for all STGs   Status Partially Met      SLP [name] #2   Title Pt's caregivers will appropriately cue patient and use alternative communication means when needed with occasional min A from SLP over 2 sessions    Status Not Met      SLP SHORT TERM GOAL #3   Title Pt will approximate one-word personally relevant responses to supplement alternatives means of  communication with usual mod A over 2 sessions    Status Not Met      SLP SHORT TERM GOAL #4   Title Pt will ID object in field of 4 to communicate wants/needs or demonstrate understanding with 75% accuracy given occasional min A over 3 sessions    Baseline 12-30-20 (family)    Status Partially Met      SLP SHORT TERM GOAL #5   Title Pt will tolerate trials of dysphagia 3/mechanical soft consistency with no overt s/sx of aspiration given occasional min A over 3 sessions    Status Unable to assess              SLP Long Term Goals - 01/20/21 1242       SLP LONG TERM GOAL #1   Title Pt will use mulitmodal communication (gesture, draw, write 1st letter etc) to augment verbal expression to meet needs at home with occasional mod A from family.    Period --   or 17 visits for all LTGs   Status Achieved   (pointing)     SLP LONG TERM GOAL #2   Title Pt will correctly ID item in field of 6 to communicate wants/needs with 75% accuracy given occasional min A over 2 sessions    Time 2    Period Weeks    Status On-going      SLP LONG TERM GOAL #3   Title Pt will approximate 3 personally relevant one word responses to supplement alternatives means of communication with usual mod A over 2 sessions    Time 2    Period Weeks    Status On-going      SLP LONG TERM GOAL #4   Title Caregivers will report improvements in communication effectiveness via QOL scale by last ST session    Time 2    Period Weeks    Status On-going      SLP LONG TERM GOAL #5   Title Pt will tolerate safest and least restricitive diet with no overt s/sx of aspiration reported or exhibited over 3 sessions    Time 2    Period Weeks    Status On-going              Plan - 01/20/21 1242     Clinical Impression Statement Shawn was referred for OPST intervention to address severe verbal apraxia, severe expressive/mod receptive aphasia, and mild to mod oropharyngeal dysphagia secondary to cerebral infarction of  left middle cerebral artery in March 2022. SLP provided ongoing education re: how to use, optimize, and personalize device. SLP modeled how to increase comprehension and accuracy of selecting icons to request wanted/needed items.  See "skilled intervention" for more details.  Due to severity of apraxia, aphasia, and persistent dysphagia secondary  to oral motor apraxia, SLP recommends skilled ST intervention to improve efficiency and accuracy of communication of basic wants/needs and to improve swallow function to reduce risk of aspiration and optimize QOL.    Speech Therapy Frequency 2x / week    Duration 8 weeks   or 17 total visits   Treatment/Interventions Aspiration precaution training;Trials of upgraded texture/liquids;Oral motor exercises;Compensatory strategies;Pharyngeal strengthening exercises;Cueing hierarchy;Functional tasks;Patient/family education;Diet toleration management by SLP;Environmental controls;Cognitive reorganization;Multimodal communcation approach;Language facilitation;Compensatory techniques;Internal/external aids;SLP instruction and feedback    Potential to Achieve Goals Fair    Potential Considerations Severity of impairments;Co-morbidities;Ability to learn/carryover information    SLP Home Exercise Plan provided    Consulted and Agree with Plan of Care Patient;Family member/caregiver    Family Member Consulted husband & brother             Patient will benefit from skilled therapeutic intervention in order to improve the following deficits and impairments:   Verbal apraxia  Aphasia    Problem List Patient Active Problem List   Diagnosis Date Noted   Right hemiplegia [facility])    Urinary retention    Hyperglycemia    Essential hypertension    Dysphagia, post-stroke    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 [place], [place], [address] Phone: [phone]   Fax:  [phone]   Name: Naome Brigandi MRN: [id number] Date of Birth: 1951-03-04

## 2021-01-20 NOTE — Therapy (Signed)
[facility] [address] [place], [place], [address] Phone: [phone]   Fax:  [phone]  Occupational Therapy Treatment  Patient Details  Name: Brittany Griffith MRN: [id number] Date of Birth: 1950-09-20 No data recorded  Encounter Date: 01/20/2021   OT End of Session - 01/20/21 1234     Visit Number 12    Number of Visits 25    Date for OT Re-Evaluation 03/06/21    Authorization Type [employer]et[employer] MCR    Authorization - Visit Number 12    Authorization - Number of Visits 20    Progress Note Due on Visit 20    OT Start Time 1234    OT Stop Time 1315    OT Time Calculation (min) 41 min    Activity Tolerance Patient tolerated treatment well    Behavior During Therapy [facility] for tasks assessed/performed             Past Medical History:  Diagnosis Date   Hypercholesteremia    Hypertension    Osteopenia    Stroke [facility])     Past Surgical History:  Procedure Laterality Date   IR ANGIO INTRA EXTRACRAN SEL COM CAROTID INNOMINATE UNI R MOD SED  10/26/2020   IR INTRA CRAN STENT  10/26/2020   IR RADIOLOGIST EVAL & MGMT  12/09/2020   RADIOLOGY WITH ANESTHESIA Left 10/26/2020   Procedure: RADIOLOGY WITH ANESTHESIA LEFT MCA STENT;  Surgeon: Luanne Bras, MD;  Location: [facility];  Service: Radiology;  Laterality: Left;    There were no vitals filed for this visit.   Subjective Assessment - 01/20/21 1234     Subjective  Pt and spouse report some pain in the RUE shoulder. Unable to obtain number score.    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 PMH: HTN, HLD, Meningioma    Limitations fall risk, aphasic,    Currently in Pain? Yes   pain reported but unable to get number d/t aphasia             Weight bearing in RUE for facilitation of muscle activation and increasing attention while reaching with LUE to place Perfection  pieces. Pt required min assistance for maintaining hand position on mat for weight bearing.   Perfection pieces - pt had increased difficulty with placing pieces in the correct place and visual perceptual differentiation. Pt req'd max cues for scanning to the right.  Sorting Cards - with mod difficulty separating and sorting by shape and by color.                    OT Short Term Goals - 01/13/21 0945       OT SHORT TERM GOAL #1   Title Pt/family Independent with initial HEP    Time 4    Period Weeks    Status Achieved      OT SHORT TERM GOAL #2   Title Pt/family independent with splint wear and care prn    Time 4    Period Weeks    Status Deferred   deferred at this time d/t no splints or braces issued. Will reinstate if needed 01/13/21     OT SHORT TERM GOAL #3   Title Pt to cut food w/ AE prn    Time 4    Period Weeks    Status Achieved   met in clinic with A/E  OT SHORT TERM GOAL #4   Title Pt/family to id A/E and one handed techniques to assist with BADLs (dressing, bathing, grooming, toileting)    Time 4    Period Weeks    Status Achieved      OT SHORT TERM GOAL #5   Title Pt to demo 25* Rt shoulder flexion in prep for low level reaching    Time 4    Period Weeks    Status On-going   25* with chest elevation presents 01/13/21     OT SHORT TERM GOAL #6   Title Pt to demo 25% finger flexion and 10% finger extension in prep for grasping/releasing Rt hand    Time 4    Period Weeks    Status Achieved   25% finger flexion and approx 10% extension 01/13/21              OT Long Term Goals - 01/13/21 0948       OT LONG TERM GOAL #1   Title Pt to be independent with updated HEP    Time 12    Period Weeks    Status On-going      OT LONG TERM GOAL #2   Title Pt to be mod I with all BADLS using A/E prn (grooming, dressing, bathing, and toileting)    Time 12    Period Weeks    Status On-going      OT LONG TERM GOAL #3   Title Pt to prepare  simple snack, sandwich, and/or microwaveable items mod I level    Time 12    Period Weeks    Status New      OT LONG TERM GOAL #4   Title Pt to perform RUE reaching in low ranges to grasp and release 1-2" objects consistently    Time 12    Period Weeks    Status New      OT LONG TERM GOAL #5   Title Pt to use RUE as min assist for BADLS and bilateral tasks    Time 12    Period Weeks    Status New      OT LONG TERM GOAL #6   Title Pt to improve RUE function as evidenced by performing 6 blocks on Box & Blocks test    Baseline unable, no functional movement    Time 12    Period Weeks    Status New                    Patient will benefit from skilled therapeutic intervention in order to improve the following deficits and impairments:           Visit Diagnosis: Other abnormalities of gait and mobility  Other symptoms and signs involving cognitive functions following cerebral infarction  Hemiplegia and hemiparesis following cerebral infarction affecting right dominant side (HCC)  Unsteadiness on feet  Other disturbances of skin sensation    Problem List Patient Active Problem List   Diagnosis Date Noted   Right hemiplegia ([place])    Urinary retention    Hyperglycemia    Essential hypertension    Dysphagia, post-stroke    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 [place], [place], [address] Phone: [phone]   Fax:  [phone]  Name: Brittany Griffith MRN: [id number] Date of Birth: April 17, 1951

## 2021-01-23 ENCOUNTER — Other Ambulatory Visit (HOSPITAL_COMMUNITY): Payer: Self-pay | Admitting: Interventional Radiology

## 2021-01-23 ENCOUNTER — Telehealth: Payer: Self-pay | Admitting: Student

## 2021-01-23 NOTE — Telephone Encounter (Signed)
Patient and her husband presented to the Radiology registration area today requesting a change to her Brilinta prescription. Patient was previously taking 90 mg Brilinta twice daily and they had to pay $100 co-pay/mo.  Due to patient having bruising issues the prescription was changed to 45 mg Brilinta twice daily but the patient still had to pay $100 co-pay/month.   The patient and her husband requested for the prescription to be changed back to 90 mg twice daily but the patient would still just take 45 mg twice daily. I explained to the patient's husband that we could not do this because of legal reasons. I advised them to call the insurance company to obtain a prior authorization to get this drug covered at a lower cost or get 60 pills for the same $100 co-pay (versus 30 pills for $100 co-pay). Patient's husband verbalized understanding.  Soyla Dryer, [place] [phone] 01/23/2021, 11:08 AM

## 2021-01-24 ENCOUNTER — Ambulatory Visit: Payer: Medicare HMO

## 2021-01-24 ENCOUNTER — Other Ambulatory Visit: Payer: Self-pay

## 2021-01-24 ENCOUNTER — Encounter: Payer: Self-pay | Admitting: *Deleted

## 2021-01-24 ENCOUNTER — Ambulatory Visit: Payer: Medicare HMO | Admitting: Occupational Therapy

## 2021-01-24 DIAGNOSIS — R2689 Other abnormalities of gait and mobility: Secondary | ICD-10-CM

## 2021-01-24 DIAGNOSIS — R4701 Aphasia: Secondary | ICD-10-CM

## 2021-01-24 DIAGNOSIS — R208 Other disturbances of skin sensation: Secondary | ICD-10-CM

## 2021-01-24 DIAGNOSIS — R482 Apraxia: Secondary | ICD-10-CM | POA: Diagnosis not present

## 2021-01-24 DIAGNOSIS — I69318 Other symptoms and signs involving cognitive functions following cerebral infarction: Secondary | ICD-10-CM

## 2021-01-24 DIAGNOSIS — R2681 Unsteadiness on feet: Secondary | ICD-10-CM

## 2021-01-24 DIAGNOSIS — I69351 Hemiplegia and hemiparesis following cerebral infarction affecting right dominant side: Secondary | ICD-10-CM

## 2021-01-24 NOTE — Therapy (Signed)
Brittany Griffith [address] [place], [place], [address] Phone: [phone]   Fax:  [phone]  Occupational Therapy Treatment  Patient Details  Name: Brittany Griffith MRN: [id number] Date of Birth: 1950-10-02 No data recorded  Encounter Date: 01/24/2021   OT End of Session - 01/24/21 1456     Visit Number 13    Number of Visits 25    Date for OT Re-Evaluation 03/06/21    Authorization Type [employer] MCR    Authorization - Visit Number 13    Authorization - Number of Visits 20    Progress Note Due on Visit 20    OT Start Time 1400    OT Stop Time 1441    OT Time Calculation (min) 41 min    Activity Tolerance Patient tolerated treatment well    Behavior During Therapy [facility] for tasks assessed/performed             Past Medical History:  Diagnosis Date   Hypercholesteremia    Hypertension    Osteopenia    Stroke [facility])     Past Surgical History:  Procedure Laterality Date   IR ANGIO INTRA EXTRACRAN SEL COM CAROTID INNOMINATE UNI R MOD SED  10/26/2020   IR INTRA CRAN STENT  10/26/2020   IR RADIOLOGIST EVAL & MGMT  12/09/2020   RADIOLOGY WITH ANESTHESIA Left 10/26/2020   Procedure: RADIOLOGY WITH ANESTHESIA LEFT MCA STENT;  Surgeon: Luanne Bras, MD;  Location: [facility];  Service: Radiology;  Laterality: Left;    There were no vitals filed for this visit.   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 PMH: HTN, HLD, Meningioma    Limitations fall risk, aphasic,    Currently in Pain? Yes    Pain Score 5     Pain Location Arm    Pain Orientation Right    Pain Descriptors / Indicators Aching;Tightness    Pain Type Acute pain    Pain Onset 1 to 4 weeks ago    Pain Frequency Intermittent    Aggravating Factors  Only with  movement    Multiple Pain Sites No                          OT Treatments/Exercises (OP) - 01/24/21 0001       Neurological Re-education Exercises   Other Exercises 1 Shoulder shrugs, rolling shoulders, scapular protraction, retraction and elevation with min-mod assist while seated today.    Other Weight-Bearing Exercises 1 WB R UE while seated at table, reaching and crossing midline with L UE/clothes pins and WB onto R wrist, hand and elbow. Discussed WB on forearms at table at home performing weight shift. Also performed WB tech while seated on mat, elbows extended and flexed (supported by therapist) and weight shifting side to side.Overall Min A required.      Weight Bearing Technique   Weight Bearing Technique Yes   See above (seated at table top as well as arm rest of chair and sitting on mat table with elboes extended and flexed.     Manual Therapy   Manual Therapy Edema management;Passive ROM    Manual therapy comments Combination of edema management/retrograde massage and finger flextion/extension right x10 min secondary  to edema in R hand and finger stiffness noted.                    OT Education - 01/24/21 1454     Education Details Cont with self P/ROM in sitting and cane exercises in supine. Weight bearing techniques discussed sitting edge of bed/couch at home vs tabletop/chair. family educated in all of the above and able to assist PRN/demonstrated undertanding    Person(s) Educated Patient;Spouse;Other (comment)   Daughter and husband   Methods Explanation;Demonstration    Comprehension Verbalized understanding;Returned demonstration;Need further instruction              OT Short Term Goals - 01/13/21 0945       OT SHORT TERM GOAL #1   Title Pt/family Independent with initial HEP    Time 4    Period Weeks    Status Achieved      OT SHORT TERM GOAL #2   Title Pt/family independent with splint wear and care prn    Time 4    Period  Weeks    Status Deferred   deferred at this time d/t no splints or braces issued. Will reinstate if needed 01/13/21     OT SHORT TERM GOAL #3   Title Pt to cut food w/ AE prn    Time 4    Period Weeks    Status Achieved   met in clinic with A/E     OT SHORT TERM GOAL #4   Title Pt/family to id A/E and one handed techniques to assist with BADLs (dressing, bathing, grooming, toileting)    Time 4    Period Weeks    Status Achieved      OT SHORT TERM GOAL #5   Title Pt to demo 25* Rt shoulder flexion in prep for low level reaching    Time 4    Period Weeks    Status On-going   25* with chest elevation presents 01/13/21     OT SHORT TERM GOAL #6   Title Pt to demo 25% finger flexion and 10% finger extension in prep for grasping/releasing Rt hand    Time 4    Period Weeks    Status Achieved   25% finger flexion and approx 10% extension 01/13/21              OT Long Term Goals - 01/13/21 0948       OT LONG TERM GOAL #1   Title Pt to be independent with updated HEP    Time 12    Period Weeks    Status On-going      OT LONG TERM GOAL #2   Title Pt to be mod I with all BADLS using A/E prn (grooming, dressing, bathing, and toileting)    Time 12    Period Weeks    Status On-going      OT LONG TERM GOAL #3   Title Pt to prepare simple snack, sandwich, and/or microwaveable items mod I level    Time 12    Period Weeks    Status New      OT LONG TERM GOAL #4   Title Pt to perform RUE reaching in low ranges to grasp and release 1-2" objects consistently    Time 12    Period Weeks    Status New      OT LONG TERM GOAL #5   Title Pt to use RUE as min assist for BADLS and  bilateral tasks    Time 12    Period Weeks    Status New      OT LONG TERM GOAL #6   Title Pt to improve RUE function as evidenced by performing 6 blocks on Box & Blocks test    Baseline unable, no functional movement    Time 12    Period Weeks    Status New                   Plan - 01/24/21  1458     Clinical Impression Statement Pt is making progress toward RUE goals. Focus on WB and weight shifting this date (seated at table top, armrest of chair as well as at mat table sitting with elbows extended/supported/and flexed supported by therapist) and pt/family education.    OT Occupational Profile and History Detailed Assessment- Review of Records and additional review of physical, cognitive, psychosocial history related to current functional performance    Occupational performance deficits (Please refer to evaluation for details): ADL's;IADL's;Social Participation;Work;Leisure    Body Structure / Function / Physical Skills ADL;Strength;Pain;Body mechanics;Edema;Proprioception;UE functional use;IADL;ROM;Coordination;Mobility;Sensation;FMC    Cognitive Skills Attention;Understand;Perception    Rehab Potential Good    Clinical Decision Making Several treatment options, min-mod task modification necessary    Comorbidities Affecting Occupational Performance: May have comorbidities impacting occupational performance    Modification or Assistance to Complete Evaluation  Min-Moderate modification of tasks or assist with assess necessary to complete eval    OT Frequency 2x / week    OT Duration 12 weeks    OT Treatment/Interventions Self-care/ADL training;Moist Heat;DME and/or AE instruction;Splinting;Therapeutic activities;Therapeutic exercise;Cognitive remediation/compensation;Coping strategies training;Neuromuscular education;Functional Mobility Training;Passive range of motion;Visual/perceptual remediation/compensation;Patient/family education;Manual Therapy;Electrical Stimulation;Energy conservation    Plan Continue NMR, consider e-stim for finger and wrist extension.    Consulted and Agree with Plan of Care Patient;Family member/caregiver    Family Member Consulted Husband and daughter             Patient will benefit from skilled therapeutic intervention in order to improve the  following deficits and impairments:   Body Structure / Function / Physical Skills: ADL, Strength, Pain, Body mechanics, Edema, Proprioception, UE functional use, IADL, ROM, Coordination, Mobility, Sensation, [facility] Cognitive Skills: Attention, Understand, Perception     Visit Diagnosis: Other abnormalities of gait and mobility  Other symptoms and signs involving cognitive functions following cerebral infarction  Hemiplegia and hemiparesis following cerebral infarction affecting right dominant side (HCC)  Other disturbances of skin sensation  Unsteadiness on feet    Problem List Patient Active Problem List   Diagnosis Date Noted   Right hemiplegia (HCC)    Urinary retention    Hyperglycemia    Essential hypertension    Dysphagia, post-stroke    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) 10/23/2020   Global aphasia 10/23/2020   Hypertensive urgency 10/23/2020    Josephine Igo Dixon, OTR/L 01/24/2021, 3:03 PM  [facility] [address] [place], [place], [address] Phone: [phone]   Fax:  [phone]  Name: Brittany Griffith MRN: [id number] Date of Birth: 10-08-1950

## 2021-01-24 NOTE — Therapy (Signed)
[facility] [address] [place], [place], [address] Phone: [phone]   Fax:  [phone]  Physical Therapy Treatment  Patient Details  Name: Brittany Griffith MRN: [id number] Date of Birth: 1951-03-02 Referring Provider (PT): Lauraine Rinne, [place]   Encounter Date: 01/24/2021   PT End of Session - 01/24/21 1433     Visit Number 13    Number of Visits 21    Date for PT Re-Evaluation 02/07/21    Authorization Type Aetna Medicare; $30 copay per day for all disciplines, Eval /19/22, 10 visit PN 01/06/21    Progress Note Due on Visit 20    PT Start Time 1445    PT Stop Time 1530    PT Time Calculation (min) 45 min    Equipment Utilized During Treatment Gait belt    Activity Tolerance Patient tolerated treatment well             Past Medical History:  Diagnosis Date   Hypercholesteremia    Hypertension    Osteopenia    Stroke [facility])     Past Surgical History:  Procedure Laterality Date   IR ANGIO INTRA EXTRACRAN SEL COM CAROTID INNOMINATE UNI R MOD SED  10/26/2020   IR INTRA CRAN STENT  10/26/2020   IR RADIOLOGIST EVAL & MGMT  12/09/2020   RADIOLOGY WITH ANESTHESIA Left 10/26/2020   Procedure: RADIOLOGY WITH ANESTHESIA LEFT MCA STENT;  Surgeon: Luanne Bras, MD;  Location: [facility];  Service: Radiology;  Laterality: Left;    There were no vitals filed for this visit.   Subjective Assessment - 01/24/21 1448     Subjective Continues to do well no balance loss or gait issues to note    Patient is accompained by: Family member    Limitations Sitting;Lifting;Standing;Walking;House hold activities    How long can you sit comfortably? no issue    How long can you stand comfortably? She can't stand    How long can you walk comfortably? She can walk with family with 25% assistance with waling for 5-10 fet at home    Patient Stated Goals Get stroonger, walk better, improve indepdnence.    Currently in Pain?  No/denies    Pain Onset 1 to 4 weeks ago                [facility] PT Assessment - 01/24/21 1500       Ambulation/Gait   Gait velocity 0.80 m/s    Stairs Yes    Stairs Assistance 6: Modified independent (Device/Increase time)    Stair Management Technique Alternating pattern;One rail Left    Number of Stairs 8      Functional Gait  Assessment   Gait assessed  Yes    Gait Level Surface Walks 20 ft in less than 7 sec but greater than 5.5 sec, uses assistive device, slower speed, mild gait deviations, or deviates 6-10 in outside of the 12 in walkway width.    Change in Gait Speed Able to smoothly change walking speed without loss of balance or gait deviation. Deviate no more than 6 in outside of the 12 in walkway width.    Gait with Horizontal Head Turns Performs head turns smoothly with slight change in gait velocity (eg, minor disruption to smooth gait path), deviates 6-10 in outside 12 in walkway width, or uses an assistive device.    Gait with Vertical Head Turns Performs task with slight change in gait velocity (eg, minor disruption to smooth  gait path), deviates 6 - 10 in outside 12 in walkway width or uses assistive device    Gait and Pivot Turn Pivot turns safely within 3 sec and stops quickly with no loss of balance.    Step Over Obstacle Is able to step over 2 stacked shoe boxes taped together (9 in total height) without changing gait speed. No evidence of imbalance.    Gait with Narrow Base of Support Is able to ambulate for 10 steps heel to toe with no staggering.    Gait with Eyes Closed Walks 20 ft, uses assistive device, slower speed, mild gait deviations, deviates 6-10 in outside 12 in walkway width. Ambulates 20 ft in less than 9 sec but greater than 7 sec.    Ambulating Backwards Walks 20 ft, no assistive devices, good speed, no evidence for imbalance, normal gait    Steps Alternating feet, must use rail.    Total Score 25    FGA comment: 25/30                            [facility] Adult PT Treatment/Exercise - 01/24/21 1500       Transfers   Transfers Sit to Stand    Sit to Stand 7: Independent    Stand to Sit 7: Independent      Ambulation/Gait   Ambulation/Gait Yes    Ambulation/Gait Assistance 6: Modified independent (Device/Increase time)    Ambulation Distance (Feet) 1000 Feet    Gait Pattern Step-through pattern;Decreased arm swing - right    Gait Comments performed advanced balance challenges of fig 8s b/t poles, reaching OH on up and down sloped surfaces, pivot turns, stop/start w/o evidence of balance loss                      PT Short Term Goals - 01/06/21 1407       PT SHORT TERM GOAL #1   Title pt will be able to ambulate 300' with quad cane with CGA and without evidence of LOB to improve short distance ambulation.    Baseline 115' quad cane CGA-MinA for occaisonal LOB (11/29/20); ambulated 1050' total without AD, >300 feet in grass with up hill and down hill (12/30/20)    Time 5    Period Weeks    Status Achieved    Target Date 01/03/21      PT SHORT TERM GOAL #2   Title Pt will be able to go up and down12 steps with use of 1 rail and CGA to improve stair negotiation in community    Baseline not tried (11/29/20); 12 steps with using one rail and reciprocal steps (12/30/20)    Time 5    Period Weeks    Status Achieved    Target Date 01/03/21      PT SHORT TERM GOAL #3   Title Pt and family will demo compliance with at least 5 days a week walking program to improve walking endurance and practice gait.    Baseline Household ambulator only (11/29/20); 10-15 min walking (12/30/20)    Time 5    Period Weeks    Status Achieved    Target Date 01/03/21               PT Long Term Goals - 01/24/21 1521       PT LONG TERM GOAL #1   Title Pt will be able to ambulate on non compliant surface for  200 feet with appropriate AD with SBA to improve community negotiation.    Baseline not attempted  (eval); >300 feet with SBA/CGA for safety; 01/24/21 Ambulationof 1051f across uneven ground, grassy surfaces requiring only S    Time 10    Period Weeks    Status Achieved      PT LONG TERM GOAL #2   Title Pt will be able to ambulate 1050' with appropriate AD with SBA to improve community ambulation    Baseline 115' with quad cane CGA to min A (11/29/20); 1050' requires CGA for occaisonal imbalance (12/30/20); 01/24/21 Ambulationof 10076facross uneven ground, grassy surfaces requiring only S    Time 10    Period Weeks    Status On-going      PT LONG TERM GOAL #3   Title Pt will demo 0.1015mimprovement on 10 meter walk test to improve walking speed and improve community ambulation    Baseline Not assessed on eval (11/29/20); 0.44m42mithout AD (12/30/20)    Time 10    Period Weeks    Status On-going      PT LONG TERM GOAL #4   Title Pt will demo 4 points improvement on BBS to reduce fall risk    Baseline not assessed on eval (11/29/20); 17/56 (12/14/20); 52/56 (12/30/20)    Time 10    Period Weeks    Status Achieved                   Plan - 01/24/21 1518     Clinical Impression Statement Todays session addressed progress towards goals, establishmnet of baselie gait speed, advanced balance challenges of negotiating grassy and uneven terrain, assessment of FGA with some difficulty conveying commands for testing components.  Goals close to being met, discuss possible PT DC    Personal Factors and Comorbidities Age;Behavior Pattern;Past/Current Experience;Time since onset of injury/illness/exacerbation;Transportation    Examination-Activity Limitations Bathing;Bed Mobility;Bend;Caring for Others;Carry;Dressing;Hygiene/Grooming;Lift;Sleep;Squat;Stairs;Stand;Toileting;Transfers    Examination-Participation Restrictions Cleaning;Community Activity;Driving;Laundry;Medication Management;Meal Prep;Personal Finances;Shop;Yard Work    StabMerchant navy officerlving/Moderate  complexity    Rehab Potential Good    PT Frequency 2x / week    PT Duration Other (comment)   10 weeks   PT Treatment/Interventions ADLs/Self Care Home Management;Moist Heat;Gait training;Stair training;Functional mobility training;Therapeutic activities;Therapeutic exercise;Balance training;Neuromuscular re-education;Cognitive remediation;Patient/family education;Orthotic Fit/Training;Manual techniques;Passive range of motion;Energy conservation;Visual/perceptual remediation/compensation;Joint Manipulations    PT Next Visit Plan continue dual tasking with gait, endurance training, consider DC?    PT Home Exercise Plan Access Code [email or web address]     Consulted and Agree with Plan of Care Patient;Family member/caregiver    Family Member Consulted brother and spouse             Patient will benefit from skilled therapeutic intervention in order to improve the following deficits and impairments:  Abnormal gait, Decreased activity tolerance, Decreased cognition, Decreased balance, Decreased coordination, Decreased safety awareness, Decreased range of motion, Decreased mobility, Decreased knowledge of use of DME, Decreased knowledge of precautions, Decreased endurance, Decreased strength, Difficulty walking, Impaired flexibility, Impaired perceived functional ability, Increased fascial restricitons, Impaired sensation, Impaired tone, Impaired UE functional use, Postural dysfunction, Pain  Visit Diagnosis: Unsteadiness on feet  Other abnormalities of gait and mobility  Other symptoms and signs involving cognitive functions following cerebral infarction  Hemiplegia and hemiparesis following cerebral infarction affecting right dominant side ([facility]  Problem List Patient Active Problem List   Diagnosis Date Noted   Right hemiplegia (HCC)[place] Urinary retention    Hyperglycemia  Essential hypertension    Dysphagia, post-stroke    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

## 2021-01-24 NOTE — Therapy (Signed)
[facility] [address] [place], [place], [address] Phone: [phone]   Fax:  [phone]  Speech Language Pathology Treatment  Patient Details  Name: Brittany Griffith MRN: [id number] Date of Birth: August 16, 1950 Referring Provider (SLP): Cathlyn Parsons, PA-C   Encounter Date: 01/24/2021   End of Session - 01/24/21 1449     Visit Number 14    Number of Visits 17    Date for SLP Re-Evaluation 02/27/21    Authorization Type [employer]    SLP Start Time 7482    SLP Stop Time  1615    SLP Time Calculation (min) 45 min    Activity Tolerance Patient tolerated treatment well             Past Medical History:  Diagnosis Date   Hypercholesteremia    Hypertension    Osteopenia    Stroke [facility])     Past Surgical History:  Procedure Laterality Date   IR ANGIO INTRA EXTRACRAN SEL COM CAROTID INNOMINATE UNI R MOD SED  10/26/2020   IR INTRA CRAN STENT  10/26/2020   IR RADIOLOGIST EVAL & MGMT  12/09/2020   RADIOLOGY WITH ANESTHESIA Left 10/26/2020   Procedure: RADIOLOGY WITH ANESTHESIA LEFT MCA STENT;  Surgeon: Luanne Bras, MD;  Location: [facility];  Service: Radiology;  Laterality: Left;    There were no vitals filed for this visit.   Subjective Assessment - 01/24/21 1502     Subjective "she's been using it"    Patient is accompained by: Family member   husband   Currently in Pain? No/denies                   ADULT SLP TREATMENT - 01/24/21 1449       General Information   Behavior/Cognition Alert;Pleasant mood;Requires cueing;Doesn't follow directions      Treatment Provided   Treatment provided Cognitive-Linquistic      Cognitive-Linquistic Treatment   Treatment focused on Apraxia;Aphasia;Patient/family/caregiver education    Skilled Treatment SLP targeted expressive and receptive language via Lingraphica device.  SLP trialed 2-3 items per row, in which pt benefited from 2 per row due to  left handed scrolling and right neglect for icons positioned near middle. Pt able to independently scroll through icon options with rare prompts. Pt able to ID correct icon for wants/needs with 70% accuracy, with occasional grunt/headshake indicating some awareness of errors x2 with self-correction noted. SLP discussed with husband modifying breakfast, lunch, and dinner pages to reduce number of misc. items and added more appropriate food items. SLP encouraged patient and husband to allow her to use device independently and explore multiple options as well as continue structured training with device at home.      Assessment / Recommendations / Plan   Plan Continue with current plan of care      Progression Toward Goals   Progression toward goals Progressing toward goals              SLP Education - 01/24/21 1624     Education Details Lingraphica trials and modficiations, learning opportunities with device    Person(s) Educated Patient;Spouse    Methods Explanation;Demonstration;Tactile cues;Verbal cues    Comprehension Verbalized understanding;Returned demonstration;Verbal cues required;Tactile cues required              SLP Short Term Goals - 01/11/21 0842       SLP SHORT TERM GOAL #1   Title Pt will use mulitmodal communication (gesture, draw,  write 1st letter etc) to augment verbal expression of basic wants/needs with usual mod A for 3 sessions    Baseline 12-30-20    Period --   or 9 visits for all STGs   Status Partially Met      SLP SHORT TERM GOAL #2   Title Pt's caregivers will appropriately cue patient and use alternative communication means when needed with occasional min A from SLP over 2 sessions    Status Not Met      SLP SHORT TERM GOAL #3   Title Pt will approximate one-word personally relevant responses to supplement alternatives means of communication with usual mod A over 2 sessions    Status Not Met      SLP SHORT TERM GOAL #4   Title Pt will ID object  in field of 4 to communicate wants/needs or demonstrate understanding with 75% accuracy given occasional min A over 3 sessions    Baseline 12-30-20 (family)    Status Partially Met      SLP SHORT TERM GOAL #5   Title Pt will tolerate trials of dysphagia 3/mechanical soft consistency with no overt s/sx of aspiration given occasional min A over 3 sessions    Status Unable to assess              SLP Long Term Goals - 01/24/21 1450       SLP LONG TERM GOAL #1   Title Pt will use mulitmodal communication (gesture, draw, write 1st letter etc) to augment verbal expression to meet needs at home with occasional mod A from family.    Period --   or 17 visits for all LTGs   Status Achieved   (pointing)     SLP LONG TERM GOAL #2   Title Pt will correctly ID item in field of 6 to communicate wants/needs with 75% accuracy given occasional min A over 2 sessions    Time 1    Period Weeks    Status On-going      SLP LONG TERM GOAL #3   Title Pt will approximate 3 personally relevant one word responses to supplement alternatives means of communication with usual mod A over 2 sessions    Baseline 01-24-21    Time 1    Period Weeks    Status On-going      SLP LONG TERM GOAL #4   Title Caregivers will report improvements in communication effectiveness via QOL scale by last ST session    Time 1    Period Weeks    Status On-going      SLP LONG TERM GOAL #5   Title Pt will tolerate safest and least restricitive diet with no overt s/sx of aspiration reported or exhibited over 3 sessions    Time 1    Period Weeks    Status On-going              Plan - 01/24/21 1624     Clinical Impression Statement Akita was referred for OPST intervention to address severe verbal apraxia, severe expressive/mod receptive aphasia, and 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 SLP provided ongoing education and assessment of Lingraphica  device. SLP modeled how to increase comprehension and accuracy of selecting icons to request wanted/needed items and food choices. Pt able to ID icon given verbal prompt with 70% accuracy this session.  See "skilled intervention" for more details.  Due to severity of apraxia, aphasia, and  persistent dysphagia secondary to oral motor apraxia, SLP recommends skilled ST intervention to improve efficiency and accuracy of communication of basic wants/needs and to improve swallow function to reduce risk of aspiration and optimize QOL.    Speech Therapy Frequency 2x / week    Duration 8 weeks   or 17 total visits   Treatment/Interventions Aspiration precaution training;Trials of upgraded texture/liquids;Oral motor exercises;Compensatory strategies;Pharyngeal strengthening exercises;Cueing hierarchy;Functional tasks;Patient/family education;Diet toleration management by SLP;Environmental controls;Cognitive reorganization;Multimodal communcation approach;Language facilitation;Compensatory techniques;Internal/external aids;SLP instruction and feedback    Potential to Achieve Goals Fair    Potential Considerations Severity of impairments;Co-morbidities;Ability to learn/carryover information    SLP Home Exercise Plan provided    Consulted and Agree with Plan of Care Patient;Family member/caregiver             Patient will benefit from skilled therapeutic intervention in order to improve the following deficits and impairments:   Verbal apraxia  Aphasia    Problem List Patient Active Problem List   Diagnosis Date Noted   Right hemiplegia [facility])    Urinary retention    Hyperglycemia    Essential hypertension    Dysphagia, post-stroke    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 [place], [place], [address] Phone: [phone]   Fax:  [phone]   Name: Wajiha Versteeg MRN: [id number] Date of Birth: [birth year]

## 2021-01-25 ENCOUNTER — Ambulatory Visit: Payer: Medicare HMO

## 2021-01-25 ENCOUNTER — Encounter: Payer: Medicare HMO | Admitting: Occupational Therapy

## 2021-01-27 ENCOUNTER — Encounter: Payer: Self-pay | Admitting: Occupational Therapy

## 2021-01-27 ENCOUNTER — Ambulatory Visit: Payer: Medicare HMO

## 2021-01-27 ENCOUNTER — Other Ambulatory Visit: Payer: Self-pay

## 2021-01-27 ENCOUNTER — Ambulatory Visit: Payer: Medicare HMO | Admitting: Occupational Therapy

## 2021-01-27 DIAGNOSIS — M6281 Muscle weakness (generalized): Secondary | ICD-10-CM

## 2021-01-27 DIAGNOSIS — I69318 Other symptoms and signs involving cognitive functions following cerebral infarction: Secondary | ICD-10-CM

## 2021-01-27 DIAGNOSIS — R2681 Unsteadiness on feet: Secondary | ICD-10-CM

## 2021-01-27 DIAGNOSIS — R2689 Other abnormalities of gait and mobility: Secondary | ICD-10-CM

## 2021-01-27 DIAGNOSIS — R208 Other disturbances of skin sensation: Secondary | ICD-10-CM

## 2021-01-27 DIAGNOSIS — R482 Apraxia: Secondary | ICD-10-CM | POA: Diagnosis not present

## 2021-01-27 DIAGNOSIS — I69351 Hemiplegia and hemiparesis following cerebral infarction affecting right dominant side: Secondary | ICD-10-CM

## 2021-01-27 DIAGNOSIS — R4701 Aphasia: Secondary | ICD-10-CM

## 2021-01-27 NOTE — Therapy (Signed)
[facility] [address] [place], [place], [address] Phone: [phone]   Fax:  [phone]  Speech Language Pathology Treatment  Patient Details  Name: Brittany Griffith MRN: [id number] Date of Birth: 1951/05/17 Referring Provider (SLP): Cathlyn Parsons, PA-C   Encounter Date: 01/27/2021   End of Session - 01/27/21 1156     Visit Number 15    Number of Visits 17    Date for SLP Re-Evaluation 02/27/21    Authorization Type [employer]    SLP Start Time 1101    SLP Stop Time  1145    SLP Time Calculation (min) 44 min    Activity Tolerance Patient tolerated treatment well             Past Medical History:  Diagnosis Date   Hypercholesteremia    Hypertension    Osteopenia    Stroke [facility])     Past Surgical History:  Procedure Laterality Date   IR ANGIO INTRA EXTRACRAN SEL COM CAROTID INNOMINATE UNI R MOD SED  10/26/2020   IR INTRA CRAN STENT  10/26/2020   IR RADIOLOGIST EVAL & MGMT  12/09/2020   RADIOLOGY WITH ANESTHESIA Left 10/26/2020   Procedure: RADIOLOGY WITH ANESTHESIA LEFT MCA STENT;  Surgeon: Luanne Bras, MD;  Location: [facility];  Service: Radiology;  Laterality: Left;    There were no vitals filed for this visit.   Subjective Assessment - 01/27/21 1103     Subjective nodded head    Patient is accompained by: Family member   husband, [name]   Currently in Pain? No/denies                   ADULT SLP TREATMENT - 01/27/21 1104       General Information   Behavior/Cognition Alert;Pleasant mood;Requires cueing;Doesn't follow directions      Treatment Provided   Treatment provided Cognitive-Linquistic      Cognitive-Linquistic Treatment   Treatment focused on Apraxia;Aphasia;Patient/family/caregiver education    Skilled Treatment Family did not modify device as recommended last ST session due to upcoming move. SLP provided additional modifications to simplify device for pt's  comprehension level and to increase accuracy and usage of device. SLP targeted new categories (health and help). SLP personalized icons, reviewed icons, and assessed pt's comprehension. Pt able to ID correct health icon with 60% accuracy on first trial, which improved to 80% accuracy on second trial after additional education. Pt able to ID correct help icon with 20% accuracy given visual prompt, which improved to 80% accuracy on second trial after additional education. SLP reviewed therapy activities to trial at home, including production of phonemes and words with specific phoneme. Pt was ~25% accurate given verbal modeling.      Assessment / Recommendations / Plan   Plan Continue with current plan of care      Progression Toward Goals   Progression toward goals Progressing toward goals              SLP Education - 01/27/21 1156     Education Details modifiying device, therapy activities    Person(s) Educated Patient;Spouse    Methods Explanation;Demonstration;Tactile cues;Verbal cues;Handout    Comprehension Verbalized understanding;Returned demonstration;Verbal cues required;Tactile cues required;Need further instruction              SLP Short Term Goals - 01/11/21 0355       SLP SHORT TERM GOAL #1   Title Pt will use mulitmodal communication (gesture, draw, write  1st letter etc) to augment verbal expression of basic wants/needs with usual mod A for 3 sessions    Baseline 12-30-20    Period --   or 9 visits for all STGs   Status Partially Met      SLP SHORT TERM GOAL #2   Title Pt's caregivers will appropriately cue patient and use alternative communication means when needed with occasional min A from SLP over 2 sessions    Status Not Met      SLP SHORT TERM GOAL #3   Title Pt will approximate one-word personally relevant responses to supplement alternatives means of communication with usual mod A over 2 sessions    Status Not Met      SLP SHORT TERM GOAL #4   Title Pt  will ID object in field of 4 to communicate wants/needs or demonstrate understanding with 75% accuracy given occasional min A over 3 sessions    Baseline 12-30-20 (family)    Status Partially Met      SLP SHORT TERM GOAL #5   Title Pt will tolerate trials of dysphagia 3/mechanical soft consistency with no overt s/sx of aspiration given occasional min A over 3 sessions    Status Unable to assess              SLP Long Term Goals - 01/27/21 1157       SLP LONG TERM GOAL #1   Title Pt will use mulitmodal communication (gesture, draw, write 1st letter etc) to augment verbal expression to meet needs at home with occasional mod A from family.    Period --   or 17 visits for all LTGs   Status Achieved   (pointing)     SLP LONG TERM GOAL #2   Title Pt will correctly ID item in field of 6 to communicate wants/needs with 75% accuracy given occasional min A over 2 sessions    Time 1    Period Weeks    Status On-going      SLP LONG TERM GOAL #3   Title Pt will approximate 3 personally relevant one word responses to supplement alternatives means of communication with usual mod A over 2 sessions    Baseline 01-24-21    Time 1    Period Weeks    Status On-going      SLP LONG TERM GOAL #4   Title Caregivers will report improvements in communication effectiveness via QOL scale by last ST session    Time 1    Period Weeks    Status On-going      SLP LONG TERM GOAL #5   Title Pt will tolerate safest and least restricitive diet with no overt s/sx of aspiration reported or exhibited over 3 sessions    Time 1    Period Weeks    Status On-going              Plan - 01/27/21 1158     Clinical Impression Statement Jalexus was referred for OPST intervention to address severe verbal apraxia, severe expressive/mod receptive aphasia, and resolving 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 SLP provided ongoing education and  assessment of Lingraphica device. SLP modeled how to increase comprehension and accuracy of selecting icons re: health and help needed. Pt able to ID icon in field of 6 given verbal and/or visual prompt with ~40% accuracy intially, which improved 80% accuracy on second trial with additional education.  See "  skilled intervention" for more details.  Due to severity of apraxia, aphasia, and persistent dysphagia secondary to oral motor apraxia, SLP recommends skilled ST intervention to improve efficiency and accuracy of communication of basic wants/needs and to improve swallow function to reduce risk of aspiration and optimize QOL.    Speech Therapy Frequency 2x / week    Duration 8 weeks   or 17 total visits   Treatment/Interventions Aspiration precaution training;Trials of upgraded texture/liquids;Oral motor exercises;Compensatory strategies;Pharyngeal strengthening exercises;Cueing hierarchy;Functional tasks;Patient/family education;Diet toleration management by SLP;Environmental controls;Cognitive reorganization;Multimodal communcation approach;Language facilitation;Compensatory techniques;Internal/external aids;SLP instruction and feedback    Potential to Achieve Goals Fair    Potential Considerations Severity of impairments;Co-morbidities;Ability to learn/carryover information    SLP Home Exercise Plan provided    Consulted and Agree with Plan of Care Patient;Family member/caregiver    Family Member Consulted husband             Patient will benefit from skilled therapeutic intervention in order to improve the following deficits and impairments:   Verbal apraxia  Aphasia    Problem List Patient Active Problem List   Diagnosis Date Noted   Right hemiplegia [facility])    Urinary retention    Hyperglycemia    Essential hypertension    Dysphagia, post-stroke    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 [place], [place], [address] Phone: [phone]   Fax:  [phone]   Name: Mattilyn Crites MRN: [id number] Date of Birth: 1951-05-01

## 2021-01-27 NOTE — Therapy (Signed)
[facility] [address] [place], [place], [address] Phone: [phone]   Fax:  [phone]  Physical Therapy Re-certification  Patient Details  Name: Brittany Griffith MRN: [id number] Date of Birth: 12/16/1950 Referring Provider (PT): Lauraine Rinne, [place]   Encounter Date: 01/27/2021   PT End of Session - 01/27/21 1004     Visit Number 14    Number of Visits 26    Date for PT Re-Evaluation 03/10/21    Authorization Type [employer]; $30 copay per day for all disciplines, Eval /19/22, 10 visit PN 01/06/21    Authorization Time Period Recert performed 0/35/00-9/38/18    Progress Note Due on Visit 24    PT Start Time 0930    PT Stop Time 1015    PT Time Calculation (min) 45 min    Equipment Utilized During Treatment Gait belt    Activity Tolerance Patient tolerated treatment well    Behavior During Therapy [facility] for tasks assessed/performed             Past Medical History:  Diagnosis Date   Hypercholesteremia    Hypertension    Osteopenia    [facility]     Past Surgical History:  Procedure Laterality Date   IR ANGIO INTRA EXTRACRAN SEL COM CAROTID INNOMINATE UNI R MOD SED  10/26/2020   IR INTRA CRAN STENT  10/26/2020   IR RADIOLOGIST EVAL & MGMT  12/09/2020   RADIOLOGY WITH ANESTHESIA Left 10/26/2020   Procedure: RADIOLOGY WITH ANESTHESIA LEFT MCA STENT;  Surgeon: Luanne Bras, MD;  Location: [facility];  Service: Radiology;  Laterality: Left;    There were no vitals filed for this visit.   Subjective Assessment - 01/27/21 1005     Subjective Stable stymptoms, no new falls.    Patient is accompained by: Family member    Limitations Sitting;Lifting;Standing;Walking;House hold activities    How long can you sit comfortably? no issue    How long can you stand comfortably? She can't stand    How long can you walk comfortably? She can walk with family with 25% assistance with waling for 5-10 fet at home     Patient Stated Goals Get stroonger, walk better, improve indepdnence.    Pain Onset 1 to 4 weeks ago               Supine to prone Prone to Q-ped on elbows required min A and moderate cueingx 3 during session Q-ped on elbow: rocking fwd and lateral: 2 x 20 Hopping in place with bil HHA: 10x Hopping fwd with L HHA: fwd and bwd: 2 x 10 Hopping lateral: bil HHA: 10x R and L Jumping jacks with LE only: bil HHA: 10x Seated at [facility] of bed: side to side propping on elbow with other hand over Head for lateral trunk stretching: 5 x 15" R and L, passively with R UE                          PT Short Term Goals - 01/27/21 1016       PT SHORT TERM GOAL #1   Title pt will be able to ambulate 300' with quad cane with CGA and without evidence of LOB to improve short distance ambulation.    Baseline 115' quad cane CGA-MinA for occaisonal LOB (11/29/20); ambulated 1050' total without AD, >300 feet in grass with up hill and down hill (12/30/20)    Time 5  Period Weeks    Status Achieved    Target Date 01/03/21      PT SHORT TERM GOAL #2   Title Pt will be able to go up and down12 steps with use of 1 rail and CGA to improve stair negotiation in community    Baseline not tried (11/29/20); 12 steps with using one rail and reciprocal steps (12/30/20)    Time 5    Period Weeks    Status Achieved    Target Date 01/03/21      PT SHORT TERM GOAL #3   Title Pt and family will demo compliance with at least 5 days a week walking program to improve walking endurance and practice gait.    Baseline Household ambulator only (11/29/20); 10-15 min walking (12/30/20)    Time 5    Period Weeks    Status Achieved    Target Date 01/03/21               PT Long Term Goals - 01/27/21 1006       PT LONG TERM GOAL #1   Title Pt will be able to ambulate on non compliant surface for 200 feet with appropriate AD with SBA to improve community negotiation.    Baseline not attempted  (eval); >300 feet with SBA/CGA for safety; 01/24/21 Ambulationof 1066f across uneven ground, grassy surfaces requiring only S    Time 10    Period Weeks    Status Achieved      PT LONG TERM GOAL #2   Title Pt will be able to ambulate 1050' with appropriate AD with SBA to improve community ambulation    Baseline 115' with quad cane CGA to min A (11/29/20); 1050' requires CGA for occaisonal imbalance (12/30/20); 01/24/21 Ambulationof 10021facross uneven ground, grassy surfaces requiring only S; pt walks 20 min with husband with SBA    Time 10    Period Weeks    Status Achieved      PT LONG TERM GOAL #3   Title Pt will demo 0.1039mimprovement on 10 meter walk test to improve walking speed and improve community ambulation    Baseline Not assessed on eval (11/29/20); 0.33m76mithout AD (12/30/20); 1 m/s (01/27/21)    Time 10    Period Weeks    Status Achieved      PT LONG TERM GOAL #4   Title Pt will demo 4 points improvement on BBS to reduce fall risk    Baseline not assessed on eval (11/29/20); 17/56 (12/14/20); 52/56 (12/30/20)    Time 10    Period Weeks    Status Achieved      PT LONG TERM GOAL #5   Title Patient will demo 1.15 m/s gait speed to significantly improve in her gait speed.    Baseline 1.86m/s21m/17/22)    Time 6    Period Weeks    Status Revised    Target Date 03/10/21                   Plan - 01/27/21 1009     Clinical Impression Statement Pt has been seen for total of 14 sessions for gait and mobility disorder due to CVA from 11/29/20 to 01/27/21. Patient has currently met all of her STG and LTG. New LTG#5 was established today to continue to progress her gait speed to improve functional ambulation in community. Patient currently has potential to improve her gait speed and will benefit from continued skilled PT  Personal Factors and Comorbidities Age;Behavior Pattern;Past/Current Experience;Time since onset of injury/illness/exacerbation;Transportation     Examination-Activity Limitations Bathing;Bed Mobility;Bend;Caring for Others;Carry;Dressing;Hygiene/Grooming;Lift;Sleep;Squat;Stairs;Stand;Toileting;Transfers    Examination-Participation Restrictions Cleaning;Community Activity;Driving;Laundry;Medication Management;Meal Prep;Personal Finances;Shop;Yard Work    [employer] Evolving/Moderate complexity    Rehab Potential Good    PT Frequency 2x / week    PT Duration 6 weeks   10 weeks   PT Treatment/Interventions ADLs/Self Care Home Management;Moist Heat;Gait training;Stair training;Functional mobility training;Therapeutic activities;Therapeutic exercise;Balance training;Neuromuscular re-education;Cognitive remediation;Patient/family education;Orthotic Fit/Training;Manual techniques;Passive range of motion;Energy conservation;Visual/perceptual remediation/compensation;Joint Manipulations    PT Next Visit Plan Work on improving gait speed, plyometrics.    PT Home Exercise Plan Access Code [email or web address]     Consulted and Agree with Plan of Care Patient;Family member/caregiver    Family Member Consulted brother and spouse             Patient will benefit from skilled therapeutic intervention in order to improve the following deficits and impairments:  Abnormal gait, Decreased activity tolerance, Decreased cognition, Decreased balance, Decreased coordination, Decreased safety awareness, Decreased range of motion, Decreased mobility, Decreased knowledge of use of DME, Decreased knowledge of precautions, Decreased endurance, Decreased strength, Difficulty walking, Impaired flexibility, Impaired perceived functional ability, Increased fascial restricitons, Impaired sensation, Impaired tone, Impaired UE functional use, Postural dysfunction, Pain  Visit Diagnosis: Other abnormalities of gait and mobility  Unsteadiness on feet  Muscle weakness (generalized)     Problem List Patient Active Problem List   Diagnosis Date Noted    Right hemiplegia ([place])    Urinary retention    Hyperglycemia    Essential hypertension    Dysphagia, post-stroke    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 [place], [place], [address] Phone: [phone]   Fax:  [phone]  Name: Hydeia Mcatee MRN: [id number] Date of Birth: [birth year]

## 2021-01-27 NOTE — Therapy (Signed)
[facility] [address] [place], [place], [address] Phone: [phone]   Fax:  [phone]  Occupational Therapy Treatment  Patient Details  Name: Brittany Griffith MRN: [id number] Date of Birth: [birth year] No data recorded  Encounter Date: 01/27/2021   OT End of Session - 01/27/21 1016     Visit Number 14    Number of Visits 25    Date for OT Re-Evaluation 03/06/21    Authorization Type [employer] MCR    Authorization - Visit Number 14    Authorization - Number of Visits 20    Progress Note Due on Visit 20    OT Start Time 1015    OT Stop Time 1058    OT Time Calculation (min) 43 min    Activity Tolerance Patient tolerated treatment well    Behavior During Therapy [facility] for tasks assessed/performed             Past Medical History:  Diagnosis Date   Hypercholesteremia    Hypertension    Osteopenia    Stroke [facility])     Past Surgical History:  Procedure Laterality Date   IR ANGIO INTRA EXTRACRAN SEL COM CAROTID INNOMINATE UNI R MOD SED  10/26/2020   IR INTRA CRAN STENT  10/26/2020   IR RADIOLOGIST EVAL & MGMT  12/09/2020   RADIOLOGY WITH ANESTHESIA Left 10/26/2020   Procedure: RADIOLOGY WITH ANESTHESIA LEFT MCA STENT;  Surgeon: Luanne Bras, MD;  Location: [facility];  Service: Radiology;  Laterality: Left;    There were no vitals filed for this visit.   Subjective Assessment - 01/27/21 1113     Subjective  Pt and spouse report some pain at night but none currently.    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 PMH: HTN, HLD, Meningioma    Limitations fall risk, aphasic,    Currently in Pain? No/denies             Neuromuscular Reeducation Weight bearing in RUE for inhibition of tone and facilitation of muscle activation while reaching with LUE to place large pegs. Pt required mod assistance for maintaining hand  position on mat for weight bearing.   Grasp/Release and working on sustained grasp of 1 inch blocks in RUE with max assistance. Pt required max assistance grabbing blocks. Pt able to maintain hold of 1 inch blocks with assistance for RUE forearm and elbow but required max to total assistance for release of blocks. Pt also did this task with medium sized cones.   WB into BUE on sliding board on chair - pt unable to follow instructions on anterior and posterior weight shifting d/t aphasia.  Sorting Cards pt sorted [place] cards with min assistance and cues. Pt with increased skill with switching categories this day and switching between colors and numbers. Pt sorted and placed in sequential order with no additional assistance.                         OT Short Term Goals - 01/13/21 0945       OT SHORT TERM GOAL #1   Title Pt/family Independent with initial HEP    Time 4    Period Weeks    Status Achieved      OT SHORT TERM GOAL #2   Title Pt/family independent with splint wear and care prn    Time 4  Period Weeks    Status Deferred   deferred at this time d/t no splints or braces issued. Will reinstate if needed 01/13/21     OT SHORT TERM GOAL #3   Title Pt to cut food w/ AE prn    Time 4    Period Weeks    Status Achieved   met in clinic with A/E     OT SHORT TERM GOAL #4   Title Pt/family to id A/E and one handed techniques to assist with BADLs (dressing, bathing, grooming, toileting)    Time 4    Period Weeks    Status Achieved      OT SHORT TERM GOAL #5   Title Pt to demo 25* Rt shoulder flexion in prep for low level reaching    Time 4    Period Weeks    Status On-going   25* with chest elevation presents 01/13/21     OT SHORT TERM GOAL #6   Title Pt to demo 25% finger flexion and 10% finger extension in prep for grasping/releasing Rt hand    Time 4    Period Weeks    Status Achieved   25% finger flexion and approx 10% extension 01/13/21               OT Long Term Goals - 01/13/21 0948       OT LONG TERM GOAL #1   Title Pt to be independent with updated HEP    Time 12    Period Weeks    Status On-going      OT LONG TERM GOAL #2   Title Pt to be mod I with all BADLS using A/E prn (grooming, dressing, bathing, and toileting)    Time 12    Period Weeks    Status On-going      OT LONG TERM GOAL #3   Title Pt to prepare simple snack, sandwich, and/or microwaveable items mod I level    Time 12    Period Weeks    Status New      OT LONG TERM GOAL #4   Title Pt to perform RUE reaching in low ranges to grasp and release 1-2" objects consistently    Time 12    Period Weeks    Status New      OT LONG TERM GOAL #5   Title Pt to use RUE as min assist for BADLS and bilateral tasks    Time 12    Period Weeks    Status New      OT LONG TERM GOAL #6   Title Pt to improve RUE function as evidenced by performing 6 blocks on Box & Blocks test    Baseline unable, no functional movement    Time 12    Period Weeks    Status New                   Plan - 01/27/21 1112     Clinical Impression Statement Pt with greater ease with sorting cards this day and following commands. Pt continues to have some slight inattention to right with scanning. Pt progressing towards goals with RUE.    OT Occupational Profile and History Detailed Assessment- Review of Records and additional review of physical, cognitive, psychosocial history related to current functional performance    Occupational performance deficits (Please refer to evaluation for details): ADL's;IADL's;Social Participation;Work;Leisure    Body Structure / Function / Physical Skills ADL;Strength;Pain;Body mechanics;Edema;Proprioception;UE functional use;IADL;ROM;Coordination;Mobility;Sensation;FMC  Cognitive Skills Attention;Understand;Perception    Rehab Potential Good    Clinical Decision Making Several treatment options, min-mod task modification necessary     Comorbidities Affecting Occupational Performance: May have comorbidities impacting occupational performance    Modification or Assistance to Complete Evaluation  Min-Moderate modification of tasks or assist with assess necessary to complete eval    OT Frequency 2x / week    OT Duration 12 weeks    OT Treatment/Interventions Self-care/ADL training;Moist Heat;DME and/or AE instruction;Splinting;Therapeutic activities;Therapeutic exercise;Cognitive remediation/compensation;Coping strategies training;Neuromuscular education;Functional Mobility Training;Passive range of motion;Visual/perceptual remediation/compensation;Patient/family education;Manual Therapy;Electrical Stimulation;Energy conservation    Plan Continue NMR, consider e-stim for finger and wrist extension.    Consulted and Agree with Plan of Care Patient;Family member/caregiver    Family Member Consulted Husband and daughter             Patient will benefit from skilled therapeutic intervention in order to improve the following deficits and impairments:   Body Structure / Function / Physical Skills: ADL, Strength, Pain, Body mechanics, Edema, Proprioception, UE functional use, IADL, ROM, Coordination, Mobility, Sensation, [facility] Cognitive Skills: Attention, Understand, Perception     Visit Diagnosis: Other abnormalities of gait and mobility  Muscle weakness (generalized)  Other symptoms and signs involving cognitive functions following cerebral infarction  Hemiplegia and hemiparesis following cerebral infarction affecting right dominant side (HCC)  Other disturbances of skin sensation    Problem List Patient Active Problem List   Diagnosis Date Noted   Right hemiplegia (HCC)    Urinary retention    Hyperglycemia    Essential hypertension    Dysphagia, post-stroke    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[place]) 11/01/2020   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

## 2021-01-31 ENCOUNTER — Ambulatory Visit: Payer: Medicare HMO

## 2021-01-31 ENCOUNTER — Encounter: Payer: Self-pay | Admitting: Occupational Therapy

## 2021-01-31 ENCOUNTER — Other Ambulatory Visit: Payer: Self-pay

## 2021-01-31 ENCOUNTER — Ambulatory Visit: Payer: Medicare HMO | Admitting: Occupational Therapy

## 2021-01-31 DIAGNOSIS — M6281 Muscle weakness (generalized): Secondary | ICD-10-CM

## 2021-01-31 DIAGNOSIS — I69351 Hemiplegia and hemiparesis following cerebral infarction affecting right dominant side: Secondary | ICD-10-CM

## 2021-01-31 DIAGNOSIS — R482 Apraxia: Secondary | ICD-10-CM

## 2021-01-31 DIAGNOSIS — R4701 Aphasia: Secondary | ICD-10-CM

## 2021-01-31 DIAGNOSIS — R2681 Unsteadiness on feet: Secondary | ICD-10-CM

## 2021-01-31 DIAGNOSIS — R1312 Dysphagia, oropharyngeal phase: Secondary | ICD-10-CM

## 2021-01-31 DIAGNOSIS — R208 Other disturbances of skin sensation: Secondary | ICD-10-CM

## 2021-01-31 DIAGNOSIS — R2689 Other abnormalities of gait and mobility: Secondary | ICD-10-CM

## 2021-01-31 DIAGNOSIS — I69318 Other symptoms and signs involving cognitive functions following cerebral infarction: Secondary | ICD-10-CM

## 2021-01-31 DIAGNOSIS — R41841 Cognitive communication deficit: Secondary | ICD-10-CM

## 2021-01-31 NOTE — Therapy (Signed)
[facility] [address] [place], [place], [address] Phone: [phone]   Fax:  [phone]  Physical Therapy Note  Patient Details  Name: Brittany Griffith MRN: [id number] Date of Birth: Jul 17, 1951 Referring Provider (PT): Lauraine Rinne, [place]   Encounter Date: 01/31/2021   PT End of Session - 01/31/21 1447     Visit Number 15    Number of Visits 26    Date for PT Re-Evaluation 03/10/21    Authorization Type [employer]; $30 copay per day for all disciplines, Eval /19/22, 10 visit PN 01/06/21    Authorization Time Period Recert performed 1/88/41-6/60/63    Progress Note Due on Visit 24    PT Start Time 1445    PT Stop Time 1530    PT Time Calculation (min) 45 min    Equipment Utilized During Treatment Gait belt    Activity Tolerance Patient tolerated treatment well    Behavior During Therapy [facility] for tasks assessed/performed             Past Medical History:  Diagnosis Date   Hypercholesteremia    Hypertension    Osteopenia    Stroke [facility])     Past Surgical History:  Procedure Laterality Date   IR ANGIO INTRA EXTRACRAN SEL COM CAROTID INNOMINATE UNI R MOD SED  10/26/2020   IR INTRA CRAN STENT  10/26/2020   IR RADIOLOGIST EVAL & MGMT  12/09/2020   RADIOLOGY WITH ANESTHESIA Left 10/26/2020   Procedure: RADIOLOGY WITH ANESTHESIA LEFT MCA STENT;  Surgeon: Luanne Bras, MD;  Location: [facility];  Service: Radiology;  Laterality: Left;    There were no vitals filed for this visit.       Supine to prone Prone on elbows: hip extensions: 2 x 10 R and L Q-ped: knees and elbows: OH reaching with L UE: 10x Tall kneeling to heel sitting: 10x Prone on elbows on countertop: hip extensions: 20x R and L Seated bil rows and chest press: glove on R hand for assist with gripping, 20lbs 20x each, with pt asked to perform trunk flexion and trunk extension with movement to improve core/lumbar stabilization Carrying  empty basket around: mod A with gripping on R UE: 200 feet Picking up 10lbs from floor with R UE, mod A with gripping:  Sci Fit: level 3 for UE and LE, glove on R hand: 10'                          PT Short Term Goals - 01/27/21 1016       PT SHORT TERM GOAL #1   Title pt will be able to ambulate 300' with quad cane with CGA and without evidence of LOB to improve short distance ambulation.    Baseline 115' quad cane CGA-MinA for occaisonal LOB (11/29/20); ambulated 1050' total without AD, >300 feet in grass with up hill and down hill (12/30/20)    Time 5    Period Weeks    Status Achieved    Target Date 01/03/21      PT SHORT TERM GOAL #2   Title Pt will be able to go up and down12 steps with use of 1 rail and CGA to improve stair negotiation in community    Baseline not tried (11/29/20); 12 steps with using one rail and reciprocal steps (12/30/20)    Time 5    Period Weeks    Status Achieved    Target  Date 01/03/21      PT SHORT TERM GOAL #3   Title Pt and family will demo compliance with at least 5 days a week walking program to improve walking endurance and practice gait.    Baseline Household ambulator only (11/29/20); 10-15 min walking (12/30/20)    Time 5    Period Weeks    Status Achieved    Target Date 01/03/21               PT Long Term Goals - 01/27/21 1006       PT LONG TERM GOAL #1   Title Pt will be able to ambulate on non compliant surface for 200 feet with appropriate AD with SBA to improve community negotiation.    Baseline not attempted (eval); >300 feet with SBA/CGA for safety; 01/24/21 Ambulationof 1029ft across uneven ground, grassy surfaces requiring only S    Time 10    Period Weeks    Status Achieved      PT LONG TERM GOAL #2   Title Pt will be able to ambulate 1050' with appropriate AD with SBA to improve community ambulation    Baseline 115' with quad cane CGA to min A (11/29/20); 1050' requires CGA for occaisonal imbalance  (12/30/20); 01/24/21 Ambulationof 1035ft across uneven ground, grassy surfaces requiring only S; pt walks 20 min with husband with SBA    Time 10    Period Weeks    Status Achieved      PT LONG TERM GOAL #3   Title Pt will demo 0.5m/s improvement on 10 meter walk test to improve walking speed and improve community ambulation    Baseline Not assessed on eval (11/29/20); 0.4m/s without AD (12/30/20); 1 m/s (01/27/21)    Time 10    Period Weeks    Status Achieved      PT LONG TERM GOAL #4   Title Pt will demo 4 points improvement on BBS to reduce fall risk    Baseline not assessed on eval (11/29/20); 17/56 (12/14/20); 52/56 (12/30/20)    Time 10    Period Weeks    Status Achieved      PT LONG TERM GOAL #5   Title Patient will demo 1.15 m/s gait speed to significantly improve in her gait speed.    Baseline 1.9m/s (01/27/21)    Time 6    Period Weeks    Status Revised    Target Date 03/10/21                   Plan - 01/31/21 1525     Clinical Impression Statement Pt tolerated session well. Pt demo good hip and core strategy with tall kneeling head turns and no LOB noted. Patient demonstrated improved shoulder extension and trunk extension with rowing but with chest press, demo poor core engagement. Pt still very hesistant with closed chain WB through R UE due poor stability in proximal girdle of shoulder.    Personal Factors and Comorbidities Age;Behavior Pattern;Past/Current Experience;Time since onset of injury/illness/exacerbation;Transportation    Examination-Activity Limitations Bathing;Bed Mobility;Bend;Caring for Others;Carry;Dressing;Hygiene/Grooming;Lift;Sleep;Squat;Stairs;Stand;Toileting;Transfers    Examination-Participation Restrictions Cleaning;Community Activity;Driving;Laundry;Medication Management;Meal Prep;Personal Finances;Shop;Yard Work    [employer] Evolving/Moderate complexity    Rehab Potential Good    PT Frequency 2x / week    PT  Duration 6 weeks   10 weeks   PT Treatment/Interventions ADLs/Self Care Home Management;Moist Heat;Gait training;Stair training;Functional mobility training;Therapeutic activities;Therapeutic exercise;Balance training;Neuromuscular re-education;Cognitive remediation;Patient/family education;Orthotic Fit/Training;Manual techniques;Passive range of motion;Energy conservation;Visual/perceptual remediation/compensation;Joint Manipulations  PT Next Visit Plan Work on improving gait speed, plyometrics.    PT Home Exercise Plan Access Code [email or web address]     Consulted and Agree with Plan of Care Patient;Family member/caregiver    Family Member Consulted son              Patient will benefit from skilled therapeutic intervention in order to improve the following deficits and impairments:  Abnormal gait, Decreased activity tolerance, Decreased cognition, Decreased balance, Decreased coordination, Decreased safety awareness, Decreased range of motion, Decreased mobility, Decreased knowledge of use of DME, Decreased knowledge of precautions, Decreased endurance, Decreased strength, Difficulty walking, Impaired flexibility, Impaired perceived functional ability, Increased fascial restricitons, Impaired sensation, Impaired tone, Impaired UE functional use, Postural dysfunction, Pain  Visit Diagnosis: Other abnormalities of gait and mobility  Muscle weakness (generalized)  Hemiplegia and hemiparesis following cerebral infarction affecting right dominant side (HCC)  Unsteadiness on feet     Problem List Patient Active Problem List   Diagnosis Date Noted   Right hemiplegia ([place])    Urinary retention    Hyperglycemia    Essential hypertension    Dysphagia, post-stroke    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 [place], [place], [address] Phone: [phone]   Fax:  [phone]  Name: Haly Feher MRN: [id number] Date of Birth: Apr 09, 1951

## 2021-02-01 NOTE — Therapy (Signed)
[facility] [address] [place], [place], [address] Phone: [phone]   Fax:  [phone]  Occupational Therapy Treatment  Patient Details  Name: Brittany Griffith MRN: [id number] Date of Birth: [birth year] No data recorded  Encounter Date: 01/31/2021   OT End of Session - 01/31/21 1404     Visit Number 15    Number of Visits 25    Date for OT Re-Evaluation 03/06/21    Authorization Type [employer] MCR    Authorization - Visit Number 15    Authorization - Number of Visits 20    Progress Note Due on Visit 20    OT Start Time 1400    OT Stop Time 1443    OT Time Calculation (min) 43 min    Activity Tolerance Patient tolerated treatment well    Behavior During Therapy [facility] for tasks assessed/performed             Past Medical History:  Diagnosis Date   Hypercholesteremia    Hypertension    Osteopenia    Stroke [facility])     Past Surgical History:  Procedure Laterality Date   IR ANGIO INTRA EXTRACRAN SEL COM CAROTID INNOMINATE UNI R MOD SED  10/26/2020   IR INTRA CRAN STENT  10/26/2020   IR RADIOLOGIST EVAL & MGMT  12/09/2020   RADIOLOGY WITH ANESTHESIA Left 10/26/2020   Procedure: RADIOLOGY WITH ANESTHESIA LEFT MCA STENT;  Surgeon: Luanne Bras, MD;  Location: [facility];  Service: Radiology;  Laterality: Left;    There were no vitals filed for this visit.   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 PMH: HTN, HLD, Meningioma    Limitations fall risk, aphasic    Currently in Pain? Yes    Pain Location Arm    Pain Orientation Right    Pain Descriptors / Indicators Aching;Tightness   descriptors identified via son   Pain Type Acute pain              OT  Treatments/Exercises - 01/31/21 1445 RUE weight bearing w/ pt using LUE to reach contralaterally and match [place] of cards. Pt required support at R shoulder and Mod A to maintain positioning of R hand; able to complete matching activity Ind w/ 100% accuracy  Sliding cards off tabletop using RUE for facilitation of shoulder extensors w/ elbow at 90* and finger ext. OT provided support under elbow/forearm. Pt demo'd trace finger ext and good shoulder ext  Tapping cones w/ RUE at tabletop height w/ support provided under elbow/forearm. Pt able to complete activity w/ Min A x12; required verbal/tactile cues to avoid compensatory pattern of shoulder elevation and use of trunk to facilitate movement. Pt also demo'd AROM of shoulder flex to approx 45* to facilitate carryover of preventing compensatory movement patterns.  UE ranger used for active shoulder flex in mid-range and elbow ext; completed 2 sets of 10 reps w/ verbal/gestural/tactile cues to facilitate full elbow ext and avoid compensatory patterns  AROM of R elbow flex against gravity w/ forearm positioned slightly above 90*. Pt able to lift forearm off OT support approx 10* consistently.  AROM of R forearm sup/pro w/ forearm supported; OT facilitated pronation      OT  Education - 01/31/21 1440     Education Details Continued condition-specific education w/ pt and her son including RUE positioning/support for protection and decreased risk of further injury, subluxation, and benefit of weight-bearing and HEP    Person(s) Educated Patient;Child(ren)   son   Methods Explanation;Demonstration    Comprehension Verbalized understanding;Need further instruction              OT Short Term Goals - 01/13/21 0945       OT SHORT TERM GOAL #1   Title Pt/family Independent with initial HEP    Time 4    Period Weeks    Status Achieved      OT SHORT TERM GOAL #2   Title Pt/family independent with splint wear and care prn    Time 4    Period  Weeks    Status Deferred   deferred at this time d/t no splints or braces issued. Will reinstate if needed 01/13/21     OT SHORT TERM GOAL #3   Title Pt to cut food w/ AE prn    Time 4    Period Weeks    Status Achieved   met in clinic with A/E     OT SHORT TERM GOAL #4   Title Pt/family to id A/E and one handed techniques to assist with BADLs (dressing, bathing, grooming, toileting)    Time 4    Period Weeks    Status Achieved      OT SHORT TERM GOAL #5   Title Pt to demo 25* Rt shoulder flexion in prep for low level reaching    Time 4    Period Weeks    Status On-going   25* with chest elevation presents 01/13/21     OT SHORT TERM GOAL #6   Title Pt to demo 25% finger flexion and 10% finger extension in prep for grasping/releasing Rt hand    Time 4    Period Weeks    Status Achieved   25% finger flexion and approx 10% extension 01/13/21              OT Long Term Goals - 01/13/21 0948       OT LONG TERM GOAL #1   Title Pt to be independent with updated HEP    Time 12    Period Weeks    Status On-going      OT LONG TERM GOAL #2   Title Pt to be mod I with all BADLS using A/E prn (grooming, dressing, bathing, and toileting)    Time 12    Period Weeks    Status On-going      OT LONG TERM GOAL #3   Title Pt to prepare simple snack, sandwich, and/or microwaveable items mod I level    Time 12    Period Weeks    Status New      OT LONG TERM GOAL #4   Title Pt to perform RUE reaching in low ranges to grasp and release 1-2" objects consistently    Time 12    Period Weeks    Status New      OT LONG TERM GOAL #5   Title Pt to use RUE as min assist for BADLS and bilateral tasks    Time 12    Period Weeks    Status New      OT LONG TERM GOAL #6   Title Pt to improve RUE function as evidenced by performing 6 blocks on Box &  Blocks test    Baseline unable, no functional movement    Time 12    Period Weeks    Status New              Plan - 01/31/21 1444      Clinical Impression Statement Pt is progressing toward goals. Focus on neuromuscular reeducation of RUE (shoulder, elbow, forearm) this session. Due to success w/ AAROM, OT facilitated AROM of shoulder flexion, elbow extension, and forearm supination w/ fair results.    OT Occupational Profile and History Detailed Assessment- Review of Records and additional review of physical, cognitive, psychosocial history related to current functional performance    Occupational performance deficits (Please refer to evaluation for details): ADL's;IADL's;Social Participation;Work;Leisure    Body Structure / Function / Physical Skills ADL;Strength;Pain;Body mechanics;Edema;Proprioception;UE functional use;IADL;ROM;Coordination;Mobility;Sensation;FMC    Cognitive Skills Attention;Understand;Perception    Rehab Potential Good    Clinical Decision Making Several treatment options, min-mod task modification necessary    Comorbidities Affecting Occupational Performance: May have comorbidities impacting occupational performance    Modification or Assistance to Complete Evaluation  Min-Moderate modification of tasks or assist with assess necessary to complete eval    OT Frequency 2x / week    OT Duration 12 weeks    OT Treatment/Interventions Self-care/ADL training;Moist Heat;DME and/or AE instruction;Splinting;Therapeutic activities;Therapeutic exercise;Cognitive remediation/compensation;Coping strategies training;Neuromuscular education;Functional Mobility Training;Passive range of motion;Visual/perceptual remediation/compensation;Patient/family education;Manual Therapy;Electrical Stimulation;Energy conservation    Plan Continue NMR, consider e-stim for finger and wrist extension    Consulted and Agree with Plan of Care Patient;Family member/caregiver    Family Member Consulted son             Patient will benefit from skilled therapeutic intervention in order to improve the following deficits and  impairments:   Body Structure / Function / Physical Skills: ADL, Strength, Pain, Body mechanics, Edema, Proprioception, UE functional use, IADL, ROM, Coordination, Mobility, Sensation, [facility] Cognitive Skills: Attention, Understand, Perception     Visit Diagnosis: Hemiplegia and hemiparesis following cerebral infarction affecting right dominant side (HCC)  Other symptoms and signs involving cognitive functions following cerebral infarction  Muscle weakness (generalized)  Other disturbances of skin sensation    Problem List Patient Active Problem List   Diagnosis Date Noted   Right hemiplegia (HCC)    Urinary retention    Hyperglycemia    Essential hypertension    Dysphagia, post-stroke    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 [place], [place], [address] Phone: [phone]   Fax:  [phone]  Name: Brittany Griffith MRN: [id number] Date of Birth: [birth year]

## 2021-02-02 ENCOUNTER — Other Ambulatory Visit: Payer: Self-pay

## 2021-02-02 ENCOUNTER — Ambulatory Visit: Payer: Medicare HMO | Admitting: Occupational Therapy

## 2021-02-02 ENCOUNTER — Ambulatory Visit: Payer: Medicare HMO

## 2021-02-02 ENCOUNTER — Encounter: Payer: Self-pay | Admitting: Occupational Therapy

## 2021-02-02 DIAGNOSIS — R2681 Unsteadiness on feet: Secondary | ICD-10-CM

## 2021-02-02 DIAGNOSIS — R482 Apraxia: Secondary | ICD-10-CM

## 2021-02-02 DIAGNOSIS — R4701 Aphasia: Secondary | ICD-10-CM

## 2021-02-02 DIAGNOSIS — R1312 Dysphagia, oropharyngeal phase: Secondary | ICD-10-CM

## 2021-02-02 DIAGNOSIS — M6281 Muscle weakness (generalized): Secondary | ICD-10-CM

## 2021-02-02 DIAGNOSIS — I69351 Hemiplegia and hemiparesis following cerebral infarction affecting right dominant side: Secondary | ICD-10-CM

## 2021-02-02 DIAGNOSIS — R41841 Cognitive communication deficit: Secondary | ICD-10-CM

## 2021-02-02 DIAGNOSIS — I69318 Other symptoms and signs involving cognitive functions following cerebral infarction: Secondary | ICD-10-CM

## 2021-02-02 NOTE — Therapy (Signed)
[facility] [address] [place], [place], [address] Phone: [phone]   Fax:  [phone]  Speech Language Pathology Treatment/Renewal Summary  Patient Details  Name: Brittany Griffith MRN: [id number] Date of Birth: 1951-03-30 Referring Provider (SLP): Cathlyn Parsons, PA-C   Encounter Date: 02/02/2021   End of Session - 02/02/21 1639     Visit Number 17    Number of Visits 33    Date for SLP Re-Evaluation 04/07/21    Authorization Type [employer]    SLP Start Time 1318    SLP Stop Time  1400    SLP Time Calculation (min) 42 min    Activity Tolerance Patient tolerated treatment well             Past Medical History:  Diagnosis Date   Hypercholesteremia    Hypertension    Osteopenia    Stroke [facility])     Past Surgical History:  Procedure Laterality Date   IR ANGIO INTRA EXTRACRAN SEL COM CAROTID INNOMINATE UNI R MOD SED  10/26/2020   IR INTRA CRAN STENT  10/26/2020   IR RADIOLOGIST EVAL & MGMT  12/09/2020   RADIOLOGY WITH ANESTHESIA Left 10/26/2020   Procedure: RADIOLOGY WITH ANESTHESIA LEFT MCA STENT;  Surgeon: Luanne Bras, MD;  Location: [facility];  Service: Radiology;  Laterality: Left;    There were no vitals filed for this visit.          ADULT SLP TREATMENT - 02/02/21 1633       General Information   Behavior/Cognition Alert;Pleasant mood;Requires cueing;Doesn't follow directions      Treatment Provided   Treatment provided Cognitive-Linquistic      Cognitive-Linquistic Treatment   Treatment focused on Apraxia;Aphasia    Skilled Treatment Pt again spontaneously set up Lingraphica to work with SLP today. SLP worked with pt today on familiarization with the [place] - basic programming and access with about me page, food page, and interests page. Pt indicated what she had for breakfast with independence and found her address and birthday with min-mod cues for what SLP was  asking for (written and verbal cues). SLP educated husband on how to progress pt for more difficult reading task - phrases with function - matching to object.      Assessment / Recommendations / Plan   Plan Continue with current plan of care      Progression Toward Goals   Progression toward goals Progressing toward goals              SLP Education - 02/02/21 1639     Education Details reading pracitce with phrases    Person(s) Educated Patient;Spouse    Methods Explanation;Demonstration    Comprehension Verbalized understanding;Returned demonstration              SLP Short Term Goals - 02/02/21 1646       SLP SHORT TERM GOAL #1   Title Pt will use mulitmodal communication (gesture, draw, write 1st letter etc) to augment verbal expression of basic wants/needs with usual mod A for 3 sessions    Baseline 12-30-20    Period --   or 9 visits for all STGs   Status Partially Met      SLP [place] #2   Title Pt's caregivers will appropriately cue patient and use alternative communication means when needed with occasional min A from SLP over 2 sessions    Status Not Met      SLP  SHORT TERM GOAL #3   Title Pt will approximate one-word personally relevant responses to supplement alternatives means of communication with usual mod A over 2 sessions    Status Not Met      SLP SHORT TERM GOAL #4   Title Pt will ID object in field of 4 to communicate wants/needs or demonstrate understanding with 75% accuracy given occasional min A over 3 sessions    Baseline 12-30-20 (family)    Status Partially Met      SLP SHORT TERM GOAL #5   Title Pt will tolerate trials of dysphagia 3/mechanical soft consistency with no overt s/sx of aspiration given occasional min A over 3 sessions    Status Unable to assess      Additional Short Term Goals   Additional Short Term Goals Yes      SLP SHORT TERM GOAL #6   Title pt will select correct icon for differentiating family members in 3  sessions    Time 4    Period Weeks    Status New      SLP SHORT TERM GOAL #7   Title pt will select correct icon for food choices in 3 sessions    Time 4    Period Weeks    Status New      SLP SHORT TERM GOAL #8   Title pt family will demonstrate knowledge of basic programming for speech generating device to maximize pt use of device with occasional min A in 2 sessions    Time 4    Period Weeks    Status New              SLP Long Term Goals - 02/02/21 1654       SLP LONG TERM GOAL #1   Title Pt will use mulitmodal communication (gesture, draw, write 1st letter etc) to augment verbal expression to meet needs at home with occasional mod A from family.    Period --   or 17 visits for all LTGs   Status Achieved   (pointing)     SLP LONG TERM GOAL #2   Title Pt will correctly ID item in field of 6 to communicate wants/needs with 75% accuracy given occasional mod A over 2 sessions    Time 1    Period Weeks    Status Revised    Target Date 02/10/21      SLP LONG TERM GOAL #3   Title Pt will approximate 3 personally relevant one word responses to supplement alternatives means of communication with usual mod A over 2 sessions    Baseline 01-24-21    Time 1    Period Weeks    Status Not Met      SLP LONG TERM GOAL #4   Title Caregivers will report improvements in communication effectiveness via QOL scale by last ST session    Time 1    Period Weeks    Status On-going    Target Date 02/10/21      SLP LONG TERM GOAL #5   Title Pt will tolerate safest and least restricitive diet with no overt s/sx of aspiration reported or exhibited over 3 sessions    Status On-going      Additional Long Term Goals   Additional Long Term Goals Yes      SLP LONG TERM GOAL #6   Title pt family will demonstrate knowledge of basic programming for speech generating device to maximize pt use of device in 3 sessions  Time 8    Period Weeks    Status New    Target Date 04/07/21      SLP  LONG TERM GOAL #7   Title pt will demonstrate navigation to correct page to communicate ideas of interest (garden, family, birds, etc) in 3 sessions    Target Date 04/07/21      SLP LONG TERM GOAL #8   Title pt will demonstrate navigation to correct page to communicate medical messages with rare min A in 3 sessions    Time 8    Period Weeks    Status New    Target Date 04/07/21              Plan - 02/02/21 1640     Clinical Impression Statement 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 She also has (according to family) resolving mild oropharyngeal dysphagia. 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 SLP provided ongoing education and assessment of Lingraphica device, which is recommended for pt to assist/augment her verbal communication. 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 SLP continues to model basic usage of Lingraphica including how to increase comprehension and accuracy of selecting icons to request wanted/needed items and family members. See "skilled intervention" for more details.  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 Swallow function will cont tobe monitored as well.    Speech Therapy Frequency 2x / week    Duration 8 weeks   possible decr in frequency after 4 weeks of x2/week -after obtaining Lingraphica   Treatment/Interventions Aspiration precaution training;Trials of upgraded texture/liquids;Oral motor exercises;Compensatory strategies;Pharyngeal strengthening exercises;Cueing hierarchy;Functional tasks;Patient/family  education;Diet toleration management by SLP;Environmental controls;Cognitive reorganization;Multimodal communcation approach;Language facilitation;Compensatory techniques;Internal/external aids;SLP instruction and feedback    Potential to Achieve Goals Fair    Potential Considerations Severity of impairments;Co-morbidities;Ability to learn/carryover information    SLP Home Exercise Plan provided    Consulted and Agree with Plan of Care Patient;Family member/caregiver    Family Member Consulted husband             Patient will benefit from skilled therapeutic intervention in order to improve the following deficits and impairments:   Aphasia  Verbal apraxia  Dysphagia, oropharyngeal phase  Cognitive communication deficit    Problem List Patient Active Problem List   Diagnosis Date Noted   Right hemiplegia ([place])    Urinary retention    Hyperglycemia    Essential hypertension    Dysphagia, post-stroke    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 [place], [place], [address] Phone: [phone]   Fax:  [phone]   Name: Brittany Griffith MRN: [id number] Date of Birth: Aug 18, 1950

## 2021-02-02 NOTE — Patient Instructions (Signed)
   To backup the Lingraphica at home:  Press orange square Press "backup and restore" Press "backup to cloud" Type Brittany Griffith for the name of the back up  *ALWAYS USE the name Brittany Griffith when you back up the device to the cloud*

## 2021-02-02 NOTE — Therapy (Signed)
[facility] [address] [place], [place], [address] Phone: [phone]   Fax:  [phone]  Speech Language Pathology Treatment  Patient Details  Name: Brittany Griffith MRN: [id number] Date of Birth: [birth year] Referring Provider (SLP): Cathlyn Parsons, PA-C   Encounter Date: 01/31/2021   End of Session - 02/02/21 0853     Visit Number 16    Number of Visits 17    Date for SLP Re-Evaluation 02/27/21    Authorization Type [employer] Medicare    SLP Start Time 1534    SLP Stop Time  1615    SLP Time Calculation (min) 41 min    Activity Tolerance Patient tolerated treatment well             Past Medical History:  Diagnosis Date   Hypercholesteremia    Hypertension    Osteopenia    Stroke [facility])     Past Surgical History:  Procedure Laterality Date   IR ANGIO INTRA EXTRACRAN SEL COM CAROTID INNOMINATE UNI R MOD SED  10/26/2020   IR INTRA CRAN STENT  10/26/2020   IR RADIOLOGIST EVAL & MGMT  12/09/2020   RADIOLOGY WITH ANESTHESIA Left 10/26/2020   Procedure: RADIOLOGY WITH ANESTHESIA LEFT MCA STENT;  Surgeon: Luanne Bras, MD;  Location: [facility];  Service: Radiology;  Laterality: Left;    There were no vitals filed for this visit.          ADULT SLP TREATMENT - 02/02/21 0001       General Information   Behavior/Cognition Alert;Pleasant mood;Requires cueing;Doesn't follow directions      Treatment Provided   Treatment provided Cognitive-Linquistic      Cognitive-Linquistic Treatment   Treatment focused on Aphasia;Apraxia    Skilled Treatment SLP worked with pt today on familiarization with the [place] - basic programming and access with family page, food page, and interests page. Pt req'd mod A consistently faded to mod A occasionally. She was able to access family members when asked who they were relationally (son, daughter in law, etc) with occasional repeats x1-2 in her native language. For  directions on how to program, pt req'd mod-max A usually faded to mod-max A occasionally (visual and verbal cues). She approximated names of family members x3/17. Pt is demonstrating a willingness to use the device. 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      Assessment / Recommendations / Plan   Plan Continue with current plan of care      Progression Toward Goals   Progression toward goals Progressing toward goals              SLP Education - 02/02/21 0852     Education Details pt will likely not end up a verbal communicator, programming and access with Lingraphica    Person(s) Educated Patient;Child(ren)    Methods Explanation;Demonstration;Verbal cues    Comprehension Verbalized understanding;Returned demonstration;Verbal cues required;Need further instruction              SLP Short Term Goals - 01/11/21 0842       SLP SHORT TERM GOAL #1   Title  Pt will use mulitmodal communication (gesture, draw, write 1st letter etc) to augment verbal expression of basic wants/needs with usual mod A for 3 sessions    Baseline 12-30-20    Period --   or 9 visits for all STGs   Status Partially Met      SLP [place] #2   Title Pt's caregivers will appropriately cue patient and use alternative communication means when needed with occasional min A from SLP over 2 sessions    Status Not Met      SLP SHORT TERM GOAL #3   Title Pt will approximate one-word personally relevant responses to supplement alternatives means of communication with usual mod A over 2 sessions    Status Not Met      SLP  SHORT TERM GOAL #4   Title Pt will ID object in field of 4 to communicate wants/needs or demonstrate understanding with 75% accuracy given occasional min A over 3 sessions    Baseline 12-30-20 (family)    Status Partially Met      SLP SHORT TERM GOAL #5   Title Pt will tolerate trials of dysphagia 3/mechanical soft consistency with no overt s/sx of aspiration given occasional min A over 3 sessions    Status Unable to assess              SLP Long Term Goals - 02/02/21 0855       SLP LONG TERM GOAL #1   Title Pt will use mulitmodal communication (gesture, draw, write 1st letter etc) to augment verbal expression to meet needs at home with occasional mod A from family.    Period --   or 17 visits for all LTGs   Status Achieved   (pointing)     SLP LONG TERM GOAL #2   Title Pt will correctly ID item in field of 6 to communicate wants/needs with 75% accuracy given occasional min A over 2 sessions    Time 1    Period Weeks    Status On-going      SLP LONG TERM GOAL #3   Title Pt will approximate 3 personally relevant one word responses to supplement alternatives means of communication with usual mod A over 2 sessions    Baseline 01-24-21    Time 1    Period Weeks    Status On-going      SLP LONG TERM GOAL #4   Title Caregivers will report improvements in communication effectiveness via QOL scale by last ST session    Time 1    Period Weeks    Status On-going      SLP LONG TERM GOAL #5   Title Pt will tolerate safest and least restricitive diet with no overt s/sx of aspiration reported or exhibited over 3 sessions    Status On-going              Plan - 02/02/21 0853     Clinical Impression Statement Anastasia was referred for OPST intervention to address severe verbal apraxia, severe expressive/mod receptive aphasia, and 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 SLP provided ongoing education and  assessment of Lingraphica device. SLP modeled how to increase comprehension and accuracy of selecting icons to request wanted/needed items and family members. See "skilled intervention" for more details.  Due to severity of apraxia, aphasia, and persistent dysphagia secondary to oral motor apraxia, SLP recommends skilled ST intervention to improve efficiency and accuracy  of communication of basic wants/needs and to improve swallow function to reduce risk of aspiration and optimize QOL.    Speech Therapy Frequency 2x / week    Duration 8 weeks   or 17 total visits   Treatment/Interventions Aspiration precaution training;Trials of upgraded texture/liquids;Oral motor exercises;Compensatory strategies;Pharyngeal strengthening exercises;Cueing hierarchy;Functional tasks;Patient/family education;Diet toleration management by SLP;Environmental controls;Cognitive reorganization;Multimodal communcation approach;Language facilitation;Compensatory techniques;Internal/external aids;SLP instruction and feedback    Potential to Achieve Goals Fair    Potential Considerations Severity of impairments;Co-morbidities;Ability to learn/carryover information    SLP Home Exercise Plan provided    Consulted and Agree with Plan of Care Patient;Family member/caregiver    Family Member Consulted husband             Patient will benefit from skilled therapeutic intervention in order to improve the following deficits and impairments:   Aphasia  Verbal apraxia  Dysphagia, oropharyngeal phase  Cognitive communication deficit    Problem List Patient Active Problem List   Diagnosis Date Noted   Right hemiplegia ([place])    Urinary retention    Hyperglycemia    Essential hypertension    Dysphagia, post-stroke    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 [place], [place], [address] Phone: [phone]   Fax:  [phone]   Name: Magaline Steinberg MRN: [id number] Date of Birth: 10-Sep-1950

## 2021-02-03 NOTE — Therapy (Signed)
[facility] [address] [place], [place], [address] Phone: [phone]   Fax:  [phone]  Occupational Therapy Treatment  Patient Details  Name: Brittany Griffith MRN: [id number] Date of Birth: [birth year] No data recorded  Encounter Date: 02/02/2021   OT End of Session - 02/02/21 1448     Visit Number 16    Number of Visits 25    Date for OT Re-Evaluation 03/06/21    Authorization Type [employer] MCR    Authorization - Visit Number 16    Authorization - Number of Visits 20    Progress Note Due on Visit 20    OT Start Time 1446    OT Stop Time 1530    OT Time Calculation (min) 44 min    Activity Tolerance Patient tolerated treatment well    Behavior During Therapy [facility] for tasks assessed/performed             Past Medical History:  Diagnosis Date   Hypercholesteremia    Hypertension    Osteopenia    Stroke [facility])     Past Surgical History:  Procedure Laterality Date   IR ANGIO INTRA EXTRACRAN SEL COM CAROTID INNOMINATE UNI R MOD SED  10/26/2020   IR INTRA CRAN STENT  10/26/2020   IR RADIOLOGIST EVAL & MGMT  12/09/2020   RADIOLOGY WITH ANESTHESIA Left 10/26/2020   Procedure: RADIOLOGY WITH ANESTHESIA LEFT MCA STENT;  Surgeon: Luanne Bras, MD;  Location: [place];  Service: Radiology;  Laterality: Left;    There were no vitals filed for this visit.   Subjective Assessment - 02/02/21 1448     Subjective  Pt and spouse report that exercises going well, some pain at night when sleeping and things are going well.    Patient is accompanied by: Family member   son   Pertinent History Lt MCA CVA 10/23/20 w/ Rt hemiparesis, aphasia, dysarthria. PMH: HTN, HLD, Meningioma    Limitations fall risk, aphasic    Currently in Pain? Yes    Pain Score 5    "moderate" per spouse   Pain Location Arm    Pain Orientation Right              NMR RUE while seated edge of mat for work on grasp/release. Pt with  minimally active grasp but unable to maintain grasp on medium cone. Pt with very limited to absent extension.   Pt's spouse went and obtained a 1 lb dumbbell from rack in therapy gym and tried to get patient to pick up the 1 lb dumbbell from the floor to facilitate a grasp response and lift the weight. OT educated patient and spouse on avoiding weighted objects and any resistance for picking up d/t subluxation in RUE shoulder and risk for further injury. Pt's spouse verbalized understanding.  Attempted quadruped but patient with limited understanding and ability to follow physical verbal and visual commands for getting into position.   Pt transitioned to side lying on R side for increased weight bearing to RUE shoulder and for decreasing load for facilitating active movement at RUE. Pt with active elbow extension and flexion this day with fair control - min A needed for maintaining position at times. Pt demonstrated active supination but trace to absent pronation. Pt had difficulty with demonstrating active wrist flexion or extension this day. OT unsure if diff demonstrating is d/t hemiparesis or aphasia.  Pt demonstrated increased ability to grasp and hold a medium cone after weight bearing  in RUE and active range of motion exercises in sidelying. Pt worked on maintaining grasp and reaching with cone but unable to activate release pattern with RUE.   Pt with significant 1 - 1.5 finger depth anterior subluxation shoulder of RUE. Worked on activation and facilitation of RUE shoulder muscles to protect and work on strengthening for decreasing subluxation and preventing further injury.                     OT Short Term Goals - 01/13/21 0945       OT SHORT TERM GOAL #1   Title Pt/family Independent with initial HEP    Time 4    Period Weeks    Status Achieved      OT SHORT TERM GOAL #2   Title Pt/family independent with splint wear and care prn    Time 4    Period Weeks     Status Deferred   deferred at this time d/t no splints or braces issued. Will reinstate if needed 01/13/21     OT SHORT TERM GOAL #3   Title Pt to cut food w/ AE prn    Time 4    Period Weeks    Status Achieved   met in clinic with A/E     OT SHORT TERM GOAL #4   Title Pt/family to id A/E and one handed techniques to assist with BADLs (dressing, bathing, grooming, toileting)    Time 4    Period Weeks    Status Achieved      OT SHORT TERM GOAL #5   Title Pt to demo 25* Rt shoulder flexion in prep for low level reaching    Time 4    Period Weeks    Status On-going   25* with chest elevation presents 01/13/21     OT SHORT TERM GOAL #6   Title Pt to demo 25% finger flexion and 10% finger extension in prep for grasping/releasing Rt hand    Time 4    Period Weeks    Status Achieved   25% finger flexion and approx 10% extension 01/13/21              OT Long Term Goals - 01/13/21 0948       OT LONG TERM GOAL #1   Title Pt to be independent with updated HEP    Time 12    Period Weeks    Status On-going      OT LONG TERM GOAL #2   Title Pt to be mod I with all BADLS using A/E prn (grooming, dressing, bathing, and toileting)    Time 12    Period Weeks    Status On-going      OT LONG TERM GOAL #3   Title Pt to prepare simple snack, sandwich, and/or microwaveable items mod I level    Time 12    Period Weeks    Status New      OT LONG TERM GOAL #4   Title Pt to perform RUE reaching in low ranges to grasp and release 1-2" objects consistently    Time 12    Period Weeks    Status New      OT LONG TERM GOAL #5   Title Pt to use RUE as min assist for BADLS and bilateral tasks    Time 12    Period Weeks    Status New      OT LONG TERM GOAL #6  Title Pt to improve RUE function as evidenced by performing 6 blocks on Box & Blocks test    Baseline unable, no functional movement    Time 12    Period Weeks    Status New                   Plan - 02/03/21 9485      Clinical Impression Statement Pt with increased activation in side lying position and with shoulder stabilization but continues with significant shoulder anterior subluxation in RUE.    OT Occupational Profile and History Detailed Assessment- Review of Records and additional review of physical, cognitive, psychosocial history related to current functional performance    Occupational performance deficits (Please refer to evaluation for details): ADL's;IADL's;Social Participation;Work;Leisure    Body Structure / Function / Physical Skills ADL;Strength;Pain;Body mechanics;Edema;Proprioception;UE functional use;IADL;ROM;Coordination;Mobility;Sensation;FMC    Cognitive Skills Attention;Understand;Perception    Rehab Potential Good    Clinical Decision Making Several treatment options, min-mod task modification necessary    Comorbidities Affecting Occupational Performance: May have comorbidities impacting occupational performance    Modification or Assistance to Complete Evaluation  Min-Moderate modification of tasks or assist with assess necessary to complete eval    OT Frequency 2x / week    OT Duration 12 weeks    OT Treatment/Interventions Self-care/ADL training;Moist Heat;DME and/or AE instruction;Splinting;Therapeutic activities;Therapeutic exercise;Cognitive remediation/compensation;Coping strategies training;Neuromuscular education;Functional Mobility Training;Passive range of motion;Visual/perceptual remediation/compensation;Patient/family education;Manual Therapy;Electrical Stimulation;Energy conservation    Plan Continue NMR, consider e-stim for finger and wrist extension    Consulted and Agree with Plan of Care Patient;Family member/caregiver    Family Member Consulted son             Patient will benefit from skilled therapeutic intervention in order to improve the following deficits and impairments:   Body Structure / Function / Physical Skills: ADL, Strength, Pain, Body  mechanics, Edema, Proprioception, UE functional use, IADL, ROM, Coordination, Mobility, Sensation, [facility] Cognitive Skills: Attention, Understand, Perception     Visit Diagnosis: Unsteadiness on feet  Muscle weakness (generalized)  Hemiplegia and hemiparesis following cerebral infarction affecting right dominant side (HCC)  Other symptoms and signs involving cognitive functions following cerebral infarction    Problem List Patient Active Problem List   Diagnosis Date Noted   Right hemiplegia (HCC)    Urinary retention    Hyperglycemia    Essential hypertension    Dysphagia, post-stroke    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

## 2021-02-07 ENCOUNTER — Ambulatory Visit: Payer: Medicare HMO

## 2021-02-07 ENCOUNTER — Ambulatory Visit: Payer: Medicare HMO | Admitting: Occupational Therapy

## 2021-02-07 ENCOUNTER — Other Ambulatory Visit: Payer: Self-pay

## 2021-02-07 DIAGNOSIS — I69351 Hemiplegia and hemiparesis following cerebral infarction affecting right dominant side: Secondary | ICD-10-CM

## 2021-02-07 DIAGNOSIS — M6281 Muscle weakness (generalized): Secondary | ICD-10-CM

## 2021-02-07 DIAGNOSIS — I69318 Other symptoms and signs involving cognitive functions following cerebral infarction: Secondary | ICD-10-CM

## 2021-02-07 DIAGNOSIS — R4701 Aphasia: Secondary | ICD-10-CM

## 2021-02-07 DIAGNOSIS — R482 Apraxia: Secondary | ICD-10-CM | POA: Diagnosis not present

## 2021-02-07 NOTE — Therapy (Signed)
[facility] [address] [place], [place], [address] Phone: [phone]   Fax:  [phone]  Physical Therapy Treatment  Patient Details  Name: Brittany Griffith MRN: [id number] Date of Birth: 1950-11-22 Referring Provider (PT): Lauraine Rinne, [place]   Encounter Date: 02/07/2021   PT End of Session - 02/07/21 1434     Visit Number 16    Number of Visits 26    Date for PT Re-Evaluation 03/10/21    Authorization Type A[employer] Medicare; $30 copay per day for all disciplines, Eval /19/22, 10 visit PN 01/06/21    Authorization Time Period Recert performed 0/24/09-7/35/32    Progress Note Due on Visit 24    PT Start Time 1445    PT Stop Time 1530    PT Time Calculation (min) 45 min    Equipment Utilized During Treatment Gait belt    Activity Tolerance Patient tolerated treatment well             Past Medical History:  Diagnosis Date   Hypercholesteremia    Hypertension    Osteopenia    Stroke [facility])     Past Surgical History:  Procedure Laterality Date   IR ANGIO INTRA EXTRACRAN SEL COM CAROTID INNOMINATE UNI R MOD SED  10/26/2020   IR INTRA CRAN STENT  10/26/2020   IR RADIOLOGIST EVAL & MGMT  12/09/2020   RADIOLOGY WITH ANESTHESIA Left 10/26/2020   Procedure: RADIOLOGY WITH ANESTHESIA LEFT MCA STENT;  Surgeon: Luanne Bras, MD;  Location: [place];  Service: Radiology;  Laterality: Left;    There were no vitals filed for this visit.                                   PT Short Term Goals - 01/27/21 1016       PT SHORT TERM GOAL #1   Title pt will be able to ambulate 300' with quad cane with CGA and without evidence of LOB to improve short distance ambulation.    Baseline 115' quad cane CGA-MinA for occaisonal LOB (11/29/20); ambulated 1050' total without AD, >300 feet in grass with up hill and down hill (12/30/20)    Time 5    Period Weeks    Status Achieved    Target Date  01/03/21      PT SHORT TERM GOAL #2   Title Pt will be able to go up and down12 steps with use of 1 rail and CGA to improve stair negotiation in community    Baseline not tried (11/29/20); 12 steps with using one rail and reciprocal steps (12/30/20)    Time 5    Period Weeks    Status Achieved    Target Date 01/03/21      PT SHORT TERM GOAL #3   Title Pt and family will demo compliance with at least 5 days a week walking program to improve walking endurance and practice gait.    Baseline Household ambulator only (11/29/20); 10-15 min walking (12/30/20)    Time 5    Period Weeks    Status Achieved    Target Date 01/03/21               PT Long Term Goals - 01/27/21 1006       PT LONG TERM GOAL #1   Title Pt will be able to ambulate on non compliant surface for 200 feet with appropriate  AD with SBA to improve community negotiation.    Baseline not attempted (eval); >300 feet with SBA/CGA for safety; 01/24/21 Ambulationof 102ft across uneven ground, grassy surfaces requiring only S    Time 10    Period Weeks    Status Achieved      PT LONG TERM GOAL #2   Title Pt will be able to ambulate 1050' with appropriate AD with SBA to improve community ambulation    Baseline 115' with quad cane CGA to min A (11/29/20); 1050' requires CGA for occaisonal imbalance (12/30/20); 01/24/21 Ambulationof 106ft across uneven ground, grassy surfaces requiring only S; pt walks 20 min with husband with SBA    Time 10    Period Weeks    Status Achieved      PT LONG TERM GOAL #3   Title Pt will demo 0.64m/s improvement on 10 meter walk test to improve walking speed and improve community ambulation    Baseline Not assessed on eval (11/29/20); 0.35m/s without AD (12/30/20); 1 m/s (01/27/21)    Time 10    Period Weeks    Status Achieved      PT LONG TERM GOAL #4   Title Pt will demo 4 points improvement on BBS to reduce fall risk    Baseline not assessed on eval (11/29/20); 17/56 (12/14/20); 52/56 (12/30/20)     Time 10    Period Weeks    Status Achieved      PT LONG TERM GOAL #5   Title Patient will demo 1.15 m/s gait speed to significantly improve in her gait speed.    Baseline 1.78m/s (01/27/21)    Time 6    Period Weeks    Status Revised    Target Date 03/10/21                     Patient will benefit from skilled therapeutic intervention in order to improve the following deficits and impairments:     Visit Diagnosis: Muscle weakness (generalized)  Hemiplegia and hemiparesis following cerebral infarction affecting right dominant side (HCC)  Other symptoms and signs involving cognitive functions following cerebral infarction     Problem List Patient Active Problem List   Diagnosis Date Noted   Right hemiplegia [facility])    Urinary retention    Hyperglycemia    Essential hypertension    Dysphagia, post-stroke    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 [place], [place], [address] Phone: [phone]   Fax:  [phone]  Name: Brittany Griffith MRN: [id number] Date of Birth: 1951-05-25

## 2021-02-07 NOTE — Therapy (Signed)
[facility] [address] [place], [place], [address] Phone: [phone]   Fax:  [phone]  Occupational Therapy Treatment  Patient Details  Name: Brittany Griffith MRN: [id number] Date of Birth: 20-Aug-1950 No data recorded  Encounter Date: 02/07/2021   OT End of Session - 02/07/21 1415     Visit Number 17    Number of Visits 25    Date for OT Re-Evaluation 03/06/21    Authorization - Visit Number 18    Authorization - Number of Visits 20    OT Start Time 1319    OT Stop Time 1358    OT Time Calculation (min) 39 min             Past Medical History:  Diagnosis Date   Hypercholesteremia    Hypertension    Osteopenia    Stroke [facility])     Past Surgical History:  Procedure Laterality Date   IR ANGIO INTRA EXTRACRAN SEL COM CAROTID INNOMINATE UNI R MOD SED  10/26/2020   IR INTRA CRAN STENT  10/26/2020   IR RADIOLOGIST EVAL & MGMT  12/09/2020   RADIOLOGY WITH ANESTHESIA Left 10/26/2020   Procedure: RADIOLOGY WITH ANESTHESIA LEFT MCA STENT;  Surgeon: Luanne Bras, MD;  Location: [place];  Service: Radiology;  Laterality: Left;    There were no vitals filed for this visit.   Subjective Assessment - 02/07/21 1321     Subjective  Pt and spouse report that exercises going well, some pain at night when sleeping and things are going well.    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 PMH: HTN, HLD, Meningioma    Currently in Pain? No/denies                   Treatment: sidelying AAA/ROM elbow flexion/ extension and supination/ pronation, mod facilitation/ v.c Seated Low range AA/ROM shoulder flexion and horizontal abduction with UE ranger , mod facilitation. Standing at tabletop weightbearing through bilateral UE's rocking forwards and backwards mod facilitation, then tableslides in standing, min v.c Low range shoulder flexion along lap with pool noodle, min  facilitation/ v.c NMES x 6 mins to wrist and finger extensors atrophy, small muscle group,  Preset, intensity 24 with good response.                OT Short Term Goals - 01/13/21 0945       OT SHORT TERM GOAL #1   Title Pt/family Independent with initial HEP    Time 4    Period Weeks    Status Achieved      OT SHORT TERM GOAL #2   Title Pt/family independent with splint wear and care prn    Time 4    Period Weeks    Status Deferred   deferred at this time d/t no splints or braces issued. Will reinstate if needed 01/13/21     OT SHORT TERM GOAL #3   Title Pt to cut food w/ AE prn    Time 4    Period Weeks    Status Achieved   met in clinic with A/E     OT SHORT TERM GOAL #4   Title Pt/family to id A/E and one handed techniques to assist with BADLs (dressing, bathing, grooming, toileting)    Time 4    Period Weeks    Status Achieved      OT SHORT TERM GOAL #5   Title Pt to  demo 25* Rt shoulder flexion in prep for low level reaching    Time 4    Period Weeks    Status On-going   25* with chest elevation presents 01/13/21     OT SHORT TERM GOAL #6   Title Pt to demo 25% finger flexion and 10% finger extension in prep for grasping/releasing Rt hand    Time 4    Period Weeks    Status Achieved   25% finger flexion and approx 10% extension 01/13/21              OT Long Term Goals - 01/13/21 0948       OT LONG TERM GOAL #1   Title Pt to be independent with updated HEP    Time 12    Period Weeks    Status On-going      OT LONG TERM GOAL #2   Title Pt to be mod I with all BADLS using A/E prn (grooming, dressing, bathing, and toileting)    Time 12    Period Weeks    Status On-going      OT LONG TERM GOAL #3   Title Pt to prepare simple snack, sandwich, and/or microwaveable items mod I level    Time 12    Period Weeks    Status New      OT LONG TERM GOAL #4   Title Pt to perform RUE reaching in low ranges to grasp and release 1-2" objects consistently     Time 12    Period Weeks    Status New      OT LONG TERM GOAL #5   Title Pt to use RUE as min assist for BADLS and bilateral tasks    Time 12    Period Weeks    Status New      OT LONG TERM GOAL #6   Title Pt to improve RUE function as evidenced by performing 6 blocks on Box & Blocks test    Baseline unable, no functional movement    Time 12    Period Weeks    Status New                   Plan - 02/07/21 1340     Clinical Impression Statement Pt is progressing towards goals. She demonstrates ability to perform AA/ROM elbow flexion in sidelying.    OT Occupational Profile and History Detailed Assessment- Review of Records and additional review of physical, cognitive, psychosocial history related to current functional performance    Occupational performance deficits (Please refer to evaluation for details): ADL's;IADL's;Social Participation;Work;Leisure    Body Structure / Function / Physical Skills ADL;Strength;Pain;Body mechanics;Edema;Proprioception;UE functional use;IADL;ROM;Coordination;Mobility;Sensation;FMC    Cognitive Skills Attention;Understand;Perception    Rehab Potential Good    Clinical Decision Making Several treatment options, min-mod task modification necessary    Comorbidities Affecting Occupational Performance: May have comorbidities impacting occupational performance    Modification or Assistance to Complete Evaluation  Min-Moderate modification of tasks or assist with assess necessary to complete eval    OT Frequency 2x / week    OT Duration 12 weeks    OT Treatment/Interventions Self-care/ADL training;Moist Heat;DME and/or AE instruction;Splinting;Therapeutic activities;Therapeutic exercise;Cognitive remediation/compensation;Coping strategies training;Neuromuscular education;Functional Mobility Training;Passive range of motion;Visual/perceptual remediation/compensation;Patient/family education;Manual Therapy;Electrical Stimulation;Energy conservation     Plan Continue NMR,may perform e-stim for finger and wrist extension    Consulted and Agree with Plan of Care Patient;Family member/caregiver    Family Member Consulted son  Patient will benefit from skilled therapeutic intervention in order to improve the following deficits and impairments:   Body Structure / Function / Physical Skills: ADL, Strength, Pain, Body mechanics, Edema, Proprioception, UE functional use, IADL, ROM, Coordination, Mobility, Sensation, 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 [place], [place], [address] Phone: [phone]   Fax:  [phone]  Name: Brittany Griffith MRN: [id number] Date of Birth: 04/05/1951

## 2021-02-07 NOTE — Therapy (Signed)
[facility] [address] [place], [place], [address] Phone: [phone]   Fax:  [phone]  Physical Therapy Re-certification  Patient Details  Name: Brittany Griffith MRN: [id number] Date of Birth: August 09, 1951 Referring Provider (PT): Lauraine Rinne, [place]   Encounter Date: 02/07/2021   PT End of Session - 02/07/21 1434     Visit Number 16    Number of Visits 26    Date for PT Re-Evaluation 03/10/21    Authorization Type [employer]; $30 copay per day for all disciplines, Eval /19/22, 10 visit PN 01/06/21    Authorization Time Period Recert performed 9/48/54-02/06/02    Progress Note Due on Visit 24    PT Start Time 1445    PT Stop Time 1530    PT Time Calculation (min) 45 min    Equipment Utilized During Treatment Gait belt    Activity Tolerance Patient tolerated treatment well             Past Medical History:  Diagnosis Date   Hypercholesteremia    Hypertension    Osteopenia    Stroke [facility])     Past Surgical History:  Procedure Laterality Date   IR ANGIO INTRA EXTRACRAN SEL COM CAROTID INNOMINATE UNI R MOD SED  10/26/2020   IR INTRA CRAN STENT  10/26/2020   IR RADIOLOGIST EVAL & MGMT  12/09/2020   RADIOLOGY WITH ANESTHESIA Left 10/26/2020   Procedure: RADIOLOGY WITH ANESTHESIA LEFT MCA STENT;  Surgeon: Luanne Bras, MD;  Location: [facility];  Service: Radiology;  Laterality: Left;    There were no vitals filed for this visit.       Supine to prone Prone to Q-ped on elbows required min A and moderate cueingx 3 during session Q-ped on elbow: rocking fwd and lateral: 2 x 20 Hopping in place with bil HHA: 10x Hopping fwd with L HHA: fwd and bwd: 2 x 10 Hopping lateral: bil HHA: 10x R and L Jumping jacks with LE only: bil HHA: 10x Seated at [facility] of bed: side to side propping on elbow with other hand over Head for lateral trunk stretching: 5 x 15" R and L, passively with R  UE                          PT Short Term Goals - 01/27/21 1016       PT SHORT TERM GOAL #1   Title pt will be able to ambulate 300' with quad cane with CGA and without evidence of LOB to improve short distance ambulation.    Baseline 115' quad cane CGA-MinA for occaisonal LOB (11/29/20); ambulated 1050' total without AD, >300 feet in grass with up hill and down hill (12/30/20)    Time 5    Period Weeks    Status Achieved    Target Date 01/03/21      PT SHORT TERM GOAL #2   Title Pt will be able to go up and down12 steps with use of 1 rail and CGA to improve stair negotiation in community    Baseline not tried (11/29/20); 12 steps with using one rail and reciprocal steps (12/30/20)    Time 5    Period Weeks    Status Achieved    Target Date 01/03/21      PT SHORT TERM GOAL #3   Title Pt and family will demo compliance with at least 5 days a week walking program to improve  walking endurance and practice gait.    Baseline Household ambulator only (11/29/20); 10-15 min walking (12/30/20)    Time 5    Period Weeks    Status Achieved    Target Date 01/03/21               PT Long Term Goals - 01/27/21 1006       PT LONG TERM GOAL #1   Title Pt will be able to ambulate on non compliant surface for 200 feet with appropriate AD with SBA to improve community negotiation.    Baseline not attempted (eval); >300 feet with SBA/CGA for safety; 01/24/21 Ambulationof 1041ft across uneven ground, grassy surfaces requiring only S    Time 10    Period Weeks    Status Achieved      PT LONG TERM GOAL #2   Title Pt will be able to ambulate 1050' with appropriate AD with SBA to improve community ambulation    Baseline 115' with quad cane CGA to min A (11/29/20); 1050' requires CGA for occaisonal imbalance (12/30/20); 01/24/21 Ambulationof 1082ft across uneven ground, grassy surfaces requiring only S; pt walks 20 min with husband with SBA    Time 10    Period Weeks    Status  Achieved      PT LONG TERM GOAL #3   Title Pt will demo 0.56m/s improvement on 10 meter walk test to improve walking speed and improve community ambulation    Baseline Not assessed on eval (11/29/20); 0.78m/s without AD (12/30/20); 1 m/s (01/27/21)    Time 10    Period Weeks    Status Achieved      PT LONG TERM GOAL #4   Title Pt will demo 4 points improvement on BBS to reduce fall risk    Baseline not assessed on eval (11/29/20); 17/56 (12/14/20); 52/56 (12/30/20)    Time 10    Period Weeks    Status Achieved      PT LONG TERM GOAL #5   Title Patient will demo 1.15 m/s gait speed to significantly improve in her gait speed.    Baseline 1.42m/s (01/27/21)    Time 6    Period Weeks    Status Revised    Target Date 03/10/21                     Patient will benefit from skilled therapeutic intervention in order to improve the following deficits and impairments:     Visit Diagnosis: Muscle weakness (generalized)  Hemiplegia and hemiparesis following cerebral infarction affecting right dominant side (HCC)  Other symptoms and signs involving cognitive functions following cerebral infarction     Problem List Patient Active Problem List   Diagnosis Date Noted   Right hemiplegia [facility])    Urinary retention    Hyperglycemia    Essential hypertension    Dysphagia, post-stroke    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 [place], [place], [address] Phone: [phone]    Fax:  [phone]  Name: DARLENE BROZOWSKI  Lianne Bushy MRN: [id number] Date of Birth: 01/11/1951

## 2021-02-07 NOTE — Therapy (Signed)
[facility] [address] [place], [place], [address] Phone: [phone]   Fax:  [phone]  Speech Language Pathology Treatment  Patient Details  Name: Brittany Griffith MRN: [id number] Date of Birth: [birth year] Referring Provider (SLP): Brittany Parsons, PA-C   Encounter Date: 02/07/2021   End of Session - 02/07/21 1434     Visit Number 18    Number of Visits 33    Date for SLP Re-Evaluation 04/07/21    Authorization Type [employer] Medicare    SLP Start Time 7824    SLP Stop Time  1615    SLP Time Calculation (min) 45 min    Activity Tolerance Patient tolerated treatment well             Past Medical History:  Diagnosis Date   Hypercholesteremia    Hypertension    Osteopenia    Stroke [facility])     Past Surgical History:  Procedure Laterality Date   IR ANGIO INTRA EXTRACRAN SEL COM CAROTID INNOMINATE UNI R MOD SED  10/26/2020   IR INTRA CRAN STENT  10/26/2020   IR RADIOLOGIST EVAL & MGMT  12/09/2020   RADIOLOGY WITH ANESTHESIA Left 10/26/2020   Procedure: RADIOLOGY WITH ANESTHESIA LEFT MCA STENT;  Surgeon: Brittany Bras, MD;  Location: [facility];  Service: Radiology;  Laterality: Left;    There were no vitals filed for this visit.   Subjective Assessment - 02/07/21 1941     Subjective Pt nodded then set up device spontaneously    Patient is accompained by: Family member   husband, Brittany Griffith   Currently in Pain? No/denies                   ADULT SLP TREATMENT - 02/07/21 1432       General Information   Behavior/Cognition Alert;Pleasant mood;Requires cueing      Treatment Provided   Treatment provided Cognitive-Linquistic      Cognitive-Linquistic Treatment   Treatment focused on Apraxia;Aphasia    Skilled Treatment Pt again spontaneously set up Lingraphica to work with SLP today. SLP worked with pt today on familiarization with the [place] - basic programming and accessing categories on  home page (about me, I want/need, family, foods, help). Usual mod fading to occasional mod cues required to ID correct category given verbal prompt. Within targeted category of wants/needs, pt able to ID icon based on verbal description with 89% accuracy. Within about me category, pt able to ID icon based on verbal description with 66% accuracy. SLP reviewed SLP team's recommendation for patient to receive own personal device to facilitate communication, in which both pt and husband indicated understanding.      Assessment / Recommendations / Plan   Plan Continue with current plan of care      Progression Toward Goals   Progression toward goals Progressing toward goals              SLP Education - 02/07/21 1932     Education Details Lingraphica recommendation, modifications and use of device    Person(s) Educated Patient;Spouse    Methods Explanation;Demonstration;Verbal cues    Comprehension Verbalized understanding;Returned demonstration;Need further instruction;Verbal cues required              SLP Short Term Goals - 02/07/21 1434       SLP SHORT TERM GOAL #1   Title Pt will use mulitmodal communication (gesture, draw, write 1st letter etc) to augment verbal expression of basic wants/needs with  usual mod A for 3 sessions    Baseline 12-30-20    Period --   or 9 visits for all STGs   Status Partially Met      SLP SHORT TERM GOAL #2   Title Pt's caregivers will appropriately cue patient and use alternative communication means when needed with occasional min A from SLP over 2 sessions    Status Not Met      SLP SHORT TERM GOAL #3   Title Pt will approximate one-word personally relevant responses to supplement alternatives means of communication with usual mod A over 2 sessions    Status Not Met      SLP SHORT TERM GOAL #4   Title Pt will ID object in field of 4 to communicate wants/needs or demonstrate understanding with 75% accuracy given occasional min A over 3 sessions     Baseline 12-30-20 (family)    Status Partially Met      SLP SHORT TERM GOAL #5   Title Pt will tolerate trials of dysphagia 3/mechanical soft consistency with no overt s/sx of aspiration given occasional min A over 3 sessions    Status Unable to assess      SLP SHORT TERM GOAL #6   Title pt will select correct icon for differentiating family members in 3 sessions    Time 4    Period Weeks    Status On-going      SLP SHORT TERM GOAL #7   Title pt will select correct icon for food choices in 3 sessions    Time 4    Period Weeks    Status On-going      SLP SHORT TERM GOAL #8   Title pt family will demonstrate knowledge of basic programming for speech generating device to maximize pt use of device with occasional min A in 2 sessions    Time 4    Period Weeks    Status On-going              SLP Long Term Goals - 02/07/21 1435       SLP LONG TERM GOAL #1   Title Pt will use mulitmodal communication (gesture, draw, write 1st letter etc) to augment verbal expression to meet needs at home with occasional mod A from family.    Period --   or 17 visits for all LTGs   Status Achieved   (pointing)     SLP LONG TERM GOAL #2   Title Pt will correctly ID item in field of 6 to communicate wants/needs with 75% accuracy given occasional mod A over 2 sessions    Baseline 02-07-21    Time --    Period --    Status Partially Met      SLP LONG TERM GOAL #3   Title Pt will approximate 3 personally relevant one word responses to supplement alternatives means of communication with usual mod A over 2 sessions    Baseline 01-24-21    Status Not Met      SLP LONG TERM GOAL #4   Title Caregivers will report improvements in communication effectiveness via QOL scale by last ST session    Time --    Period --    Status Deferred      SLP LONG TERM GOAL #5   Title Pt will tolerate safest and least restricitive diet with no overt s/sx of aspiration reported or exhibited over 3 sessions     Status Unable to assess  SLP LONG TERM GOAL #6   Title pt family will demonstrate knowledge of basic programming for speech generating device to maximize pt use of device in 3 sessions    Time 8    Period Weeks    Status On-going    Target Date 04/07/21      SLP LONG TERM GOAL #7   Title pt will demonstrate navigation to correct page to communicate ideas of interest (garden, family, birds, etc) in 3 sessions    Time 8    Period Weeks    Status On-going    Target Date 04/07/21      SLP LONG TERM GOAL #8   Title pt will demonstrate navigation to correct page to communicate medical messages with rare min A in 3 sessions    Time 8    Period Weeks    Status On-going    Target Date 04/07/21              Plan - 02/07/21 1933     Clinical Impression Statement 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 She also has (according to family) resolving mild oropharyngeal dysphagia. 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 SLP provided ongoing education and assessment of Lingraphica device, which is recommended for pt to assist/augment her verbal communication. 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 SLP continues to model basic usage of Lingraphica including how to increase comprehension and accuracy of selecting icons to request wanted/needed items and family members. See "skilled intervention" for more details. Plan to hold ST sessions until personal Lingraphica arrives (trial device returned today and paperwork submitted for personal device). 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 Swallow function will cont to be monitored as well.    Speech Therapy Frequency 2x / week    Duration 8 weeks   possible decr in frequency after 4 weeks of x2/week -after obtaining Lingraphica   Treatment/Interventions Aspiration precaution training;Trials of upgraded texture/liquids;Oral motor exercises;Compensatory strategies;Pharyngeal strengthening exercises;Cueing hierarchy;Functional tasks;Patient/family education;Diet toleration management by SLP;Environmental controls;Cognitive reorganization;Multimodal communcation approach;Language facilitation;Compensatory techniques;Internal/external aids;SLP instruction and feedback    Potential to Achieve Goals Fair    Potential Considerations Severity of impairments;Co-morbidities;Ability to learn/carryover information    SLP Home Exercise Plan provided    Consulted and Agree with Plan of Care Patient;Family member/caregiver    Family Member Consulted husband             Patient will benefit from skilled therapeutic intervention in order to improve the following deficits and impairments:   Verbal apraxia  Aphasia    Problem List Patient Active Problem List   Diagnosis Date Noted   Right hemiplegia [facility])    Urinary retention    Hyperglycemia    Essential hypertension    Dysphagia, post-stroke    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 [place], [place], [address] Phone: [phone]    Fax:  [phone]   Name: Shelisa Fern MRN: [id number] Date of Birth: 01-07-1951

## 2021-02-07 NOTE — Therapy (Deleted)
[facility] [address] [place], [place], [address] Phone: [phone]   Fax:  [phone]  Physical Therapy Note  Patient Details  Name: Brittany Griffith MRN: [id number] Date of Birth: December 03, 1950 Referring Provider (PT): Lauraine Rinne, [place]   Encounter Date: 02/07/2021   PT End of Session - 02/07/21 1434     Visit Number 16    Number of Visits 26    Date for PT Re-Evaluation 03/10/21    Authorization Type [employer] Medicare; $30 copay per day for all disciplines, Eval /19/22, 10 visit PN 01/06/21    Authorization Time Period Recert performed 7/32/20-2/54/27    Progress Note Due on Visit 24    PT Start Time 1445    PT Stop Time 1530    PT Time Calculation (min) 45 min    Equipment Utilized During Treatment Gait belt    Activity Tolerance Patient tolerated treatment well             Past Medical History:  Diagnosis Date   Hypercholesteremia    Hypertension    Osteopenia    Stroke [facility])     Past Surgical History:  Procedure Laterality Date   IR ANGIO INTRA EXTRACRAN SEL COM CAROTID INNOMINATE UNI R MOD SED  10/26/2020   IR INTRA CRAN STENT  10/26/2020   IR RADIOLOGIST EVAL & MGMT  12/09/2020   RADIOLOGY WITH ANESTHESIA Left 10/26/2020   Procedure: RADIOLOGY WITH ANESTHESIA LEFT MCA STENT;  Surgeon: Luanne Bras, MD;  Location: [facility];  Service: Radiology;  Laterality: Left;   Subjective: No new c/o, no pain to report, L shoulder doing well with less pain  There were no vitals filed for this visit.      Treatment:   02/07/21 Scifit 8' no arms L 2 Seated core exercises of hip tosses, shoulder tosses, Vs and chops with 1/2 kg weight held with B hands Seated LAQs and marching with latissimus press downs to facilitate core, 15 reps ea. alt. PT assist required to maintain UE position  Standing with foam roller support, opposite abd of UE/LE, modified to accommodate LUE paresis Standing with foam roll  support, marching, heel toe, alt. where appropriate, 15 reps STS from Airex pad, no UE assist, 15x                          PT Short Term Goals - 01/27/21 1016       PT SHORT TERM GOAL #1   Title pt will be able to ambulate 300' with quad cane with CGA and without evidence of LOB to improve short distance ambulation.    Baseline 115' quad cane CGA-MinA for occaisonal LOB (11/29/20); ambulated 1050' total without AD, >300 feet in grass with up hill and down hill (12/30/20)    Time 5    Period Weeks    Status Achieved    Target Date 01/03/21      PT SHORT TERM GOAL #2   Title Pt will be able to go up and down12 steps with use of 1 rail and CGA to improve stair negotiation in community    Baseline not tried (11/29/20); 12 steps with using one rail and reciprocal steps (12/30/20)    Time 5    Period Weeks    Status Achieved    Target Date 01/03/21      PT SHORT TERM GOAL #3   Title Pt and family will demo compliance with  at least 5 days a week walking program to improve walking endurance and practice gait.    Baseline Household ambulator only (11/29/20); 10-15 min walking (12/30/20)    Time 5    Period Weeks    Status Achieved    Target Date 01/03/21               PT Long Term Goals - 01/27/21 1006       PT LONG TERM GOAL #1   Title Pt will be able to ambulate on non compliant surface for 200 feet with appropriate AD with SBA to improve community negotiation.    Baseline not attempted (eval); >300 feet with SBA/CGA for safety; 01/24/21 Ambulationof 1065ft across uneven ground, grassy surfaces requiring only S    Time 10    Period Weeks    Status Achieved      PT LONG TERM GOAL #2   Title Pt will be able to ambulate 1050' with appropriate AD with SBA to improve community ambulation    Baseline 115' with quad cane CGA to min A (11/29/20); 1050' requires CGA for occaisonal imbalance (12/30/20); 01/24/21 Ambulationof 1060ft across uneven ground, grassy surfaces  requiring only S; pt walks 20 min with husband with SBA    Time 10    Period Weeks    Status Achieved      PT LONG TERM GOAL #3   Title Pt will demo 0.62m/s improvement on 10 meter walk test to improve walking speed and improve community ambulation    Baseline Not assessed on eval (11/29/20); 0.67m/s without AD (12/30/20); 1 m/s (01/27/21)    Time 10    Period Weeks    Status Achieved      PT LONG TERM GOAL #4   Title Pt will demo 4 points improvement on BBS to reduce fall risk    Baseline not assessed on eval (11/29/20); 17/56 (12/14/20); 52/56 (12/30/20)    Time 10    Period Weeks    Status Achieved      PT LONG TERM GOAL #5   Title Patient will demo 1.15 m/s gait speed to significantly improve in her gait speed.    Baseline 1.1m/s (01/27/21)    Time 6    Period Weeks    Status Revised    Target Date 03/10/21                      Patient will benefit from skilled therapeutic intervention in order to improve the following deficits and impairments:     Visit Diagnosis: Muscle weakness (generalized)  Hemiplegia and hemiparesis following cerebral infarction affecting right dominant side (HCC)  Other symptoms and signs involving cognitive functions following cerebral infarction     Problem List Patient Active Problem List   Diagnosis Date Noted   Right hemiplegia [facility])    Urinary retention    Hyperglycemia    Essential hypertension    Dysphagia, post-stroke    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 [place], [place], [address] Phone:  [phone]   Fax:  [phone]  Name: Brittany Griffith MRN: [id number] Date of Birth: [birth year]

## 2021-02-07 NOTE — Therapy (Deleted)
[facility] [address] [place], [place], [address] Phone: [phone]   Fax:  [phone]  Physical Therapy Treatment  Patient Details  Name: Brittany Griffith MRN: [id number] Date of Birth: [birth year] Referring Provider (PT): Lauraine Rinne, [place]   Encounter Date: 02/07/2021   PT End of Session - 02/07/21 1434     Visit Number 16    Number of Visits 26    Date for PT Re-Evaluation 03/10/21    Authorization Type [employer]; $30 copay per day for all disciplines, Eval /19/22, 10 visit PN 01/06/21    Authorization Time Period Recert performed 7/41/63-8/45/36    Progress Note Due on Visit 24    PT Start Time 1445    PT Stop Time 1530    PT Time Calculation (min) 45 min    Equipment Utilized During Treatment Gait belt    Activity Tolerance Patient tolerated treatment well             Past Medical History:  Diagnosis Date   Hypercholesteremia    Hypertension    Osteopenia    Stroke [facility])     Past Surgical History:  Procedure Laterality Date   IR ANGIO INTRA EXTRACRAN SEL COM CAROTID INNOMINATE UNI R MOD SED  10/26/2020   IR INTRA CRAN STENT  10/26/2020   IR RADIOLOGIST EVAL & MGMT  12/09/2020   RADIOLOGY WITH ANESTHESIA Left 10/26/2020   Procedure: RADIOLOGY WITH ANESTHESIA LEFT MCA STENT;  Surgeon: Luanne Bras, MD;  Location: [facility];  Service: Radiology;  Laterality: Left;    There were no vitals filed for this visit.   02/07/21 Scifit 8' no arms L 2 Seated core exercises of hip tosses, shoulder tosses, Vs and chops with 1/2 kg weight held with B hands Seated LAQs and marching with latissimus press downs to facilitate core, 15 reps ea. alt. PT assist required to maintain UE position Standing with foam roller support, opposite abd of UE/LE, modified to accommodate LUE paresis Standing with foam roll support, marching, heel toe, alt. where appropriate, 15 reps STS from Airex pad, no UE assist,  15x                                PT Short Term Goals - 01/27/21 1016       PT SHORT TERM GOAL #1   Title pt will be able to ambulate 300' with quad cane with CGA and without evidence of LOB to improve short distance ambulation.    Baseline 115' quad cane CGA-MinA for occaisonal LOB (11/29/20); ambulated 1050' total without AD, >300 feet in grass with up hill and down hill (12/30/20)    Time 5    Period Weeks    Status Achieved    Target Date 01/03/21      PT SHORT TERM GOAL #2   Title Pt will be able to go up and down12 steps with use of 1 rail and CGA to improve stair negotiation in community    Baseline not tried (11/29/20); 12 steps with using one rail and reciprocal steps (12/30/20)    Time 5    Period Weeks    Status Achieved    Target Date 01/03/21      PT SHORT TERM GOAL #3   Title Pt and family will demo compliance with at least 5 days a week walking program to improve walking endurance and practice gait.  Baseline Household ambulator only (11/29/20); 10-15 min walking (12/30/20)    Time 5    Period Weeks    Status Achieved    Target Date 01/03/21               PT Long Term Goals - 01/27/21 1006       PT LONG TERM GOAL #1   Title Pt will be able to ambulate on non compliant surface for 200 feet with appropriate AD with SBA to improve community negotiation.    Baseline not attempted (eval); >300 feet with SBA/CGA for safety; 01/24/21 Ambulationof 1052ft across uneven ground, grassy surfaces requiring only S    Time 10    Period Weeks    Status Achieved      PT LONG TERM GOAL #2   Title Pt will be able to ambulate 1050' with appropriate AD with SBA to improve community ambulation    Baseline 115' with quad cane CGA to min A (11/29/20); 1050' requires CGA for occaisonal imbalance (12/30/20); 01/24/21 Ambulationof 1074ft across uneven ground, grassy surfaces requiring only S; pt walks 20 min with husband with SBA    Time 10    Period Weeks     Status Achieved      PT LONG TERM GOAL #3   Title Pt will demo 0.50m/s improvement on 10 meter walk test to improve walking speed and improve community ambulation    Baseline Not assessed on eval (11/29/20); 0.4m/s without AD (12/30/20); 1 m/s (01/27/21)    Time 10    Period Weeks    Status Achieved      PT LONG TERM GOAL #4   Title Pt will demo 4 points improvement on BBS to reduce fall risk    Baseline not assessed on eval (11/29/20); 17/56 (12/14/20); 52/56 (12/30/20)    Time 10    Period Weeks    Status Achieved      PT LONG TERM GOAL #5   Title Patient will demo 1.15 m/s gait speed to significantly improve in her gait speed.    Baseline 1.52m/s (01/27/21)    Time 6    Period Weeks    Status Revised    Target Date 03/10/21                     Patient will benefit from skilled therapeutic intervention in order to improve the following deficits and impairments:     Visit Diagnosis: Muscle weakness (generalized)  Hemiplegia and hemiparesis following cerebral infarction affecting right dominant side (HCC)  Other symptoms and signs involving cognitive functions following cerebral infarction     Problem List Patient Active Problem List   Diagnosis Date Noted   Right hemiplegia [facility])    Urinary retention    Hyperglycemia    Essential hypertension    Dysphagia, post-stroke    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[place]) 11/01/2020   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

## 2021-02-08 NOTE — Therapy (Signed)
[facility] [address] [place], [place], [address] Phone: [phone]   Fax:  [phone]  Physical Therapy Treatment  Patient Details  Name: Brittany Griffith MRN: [id number] Date of Birth: 1951/05/17 Referring Provider (PT): Lauraine Rinne, [place]   Encounter Date: 02/07/2021   PT End of Session - 02/07/21 1434     Visit Number 16    Number of Visits 26    Date for PT Re-Evaluation 03/10/21    Authorization Type [employer] Medicare; $30 copay per day for all disciplines, Eval /19/22, 10 visit PN 01/06/21    Authorization Time Period Recert performed 6/81/15-03/08/19    Progress Note Due on Visit 24    PT Start Time 1445    PT Stop Time 1530    PT Time Calculation (min) 45 min    Equipment Utilized During Treatment Gait belt    Activity Tolerance Patient tolerated treatment well             Past Medical History:  Diagnosis Date   Hypercholesteremia    Hypertension    Osteopenia    Stroke [facility])     Past Surgical History:  Procedure Laterality Date   IR ANGIO INTRA EXTRACRAN SEL COM CAROTID INNOMINATE UNI R MOD SED  10/26/2020   IR INTRA CRAN STENT  10/26/2020   IR RADIOLOGIST EVAL & MGMT  12/09/2020   RADIOLOGY WITH ANESTHESIA Left 10/26/2020   Procedure: RADIOLOGY WITH ANESTHESIA LEFT MCA STENT;  Surgeon: Luanne Bras, MD;  Location: [facility];  Service: Radiology;  Laterality: Left;    There were no vitals filed for this visit.    02/07/21 Scifit 8' no arms L 2 Seated core exercises of hip tosses, shoulder tosses, Vs and chops with 1/2 kg weight held with B hands Seated LAQs and marching with latissimus press downs to facilitate core, 15 reps ea. alt. PT assist required to maintain UE position Standing with foam roller support, opposite abd of UE/LE, modified to accommodate LUE paresis Standing with foam roll support, marching, heel toe, alt. where appropriate, 15 reps STS from Airex pad, no UE assist,  15x                                PT Short Term Goals - 01/27/21 1016       PT SHORT TERM GOAL #1   Title pt will be able to ambulate 300' with quad cane with CGA and without evidence of LOB to improve short distance ambulation.    Baseline 115' quad cane CGA-MinA for occaisonal LOB (11/29/20); ambulated 1050' total without AD, >300 feet in grass with up hill and down hill (12/30/20)    Time 5    Period Weeks    Status Achieved    Target Date 01/03/21      PT SHORT TERM GOAL #2   Title Pt will be able to go up and down12 steps with use of 1 rail and CGA to improve stair negotiation in community    Baseline not tried (11/29/20); 12 steps with using one rail and reciprocal steps (12/30/20)    Time 5    Period Weeks    Status Achieved    Target Date 01/03/21      PT SHORT TERM GOAL #3   Title Pt and family will demo compliance with at least 5 days a week walking program to improve walking endurance and practice gait.  Baseline Household ambulator only (11/29/20); 10-15 min walking (12/30/20)    Time 5    Period Weeks    Status Achieved    Target Date 01/03/21               PT Long Term Goals - 01/27/21 1006       PT LONG TERM GOAL #1   Title Pt will be able to ambulate on non compliant surface for 200 feet with appropriate AD with SBA to improve community negotiation.    Baseline not attempted (eval); >300 feet with SBA/CGA for safety; 01/24/21 Ambulationof 1044ft across uneven ground, grassy surfaces requiring only S    Time 10    Period Weeks    Status Achieved      PT LONG TERM GOAL #2   Title Pt will be able to ambulate 1050' with appropriate AD with SBA to improve community ambulation    Baseline 115' with quad cane CGA to min A (11/29/20); 1050' requires CGA for occaisonal imbalance (12/30/20); 01/24/21 Ambulationof 1048ft across uneven ground, grassy surfaces requiring only S; pt walks 20 min with husband with SBA    Time 10    Period Weeks     Status Achieved      PT LONG TERM GOAL #3   Title Pt will demo 0.32m/s improvement on 10 meter walk test to improve walking speed and improve community ambulation    Baseline Not assessed on eval (11/29/20); 0.52m/s without AD (12/30/20); 1 m/s (01/27/21)    Time 10    Period Weeks    Status Achieved      PT LONG TERM GOAL #4   Title Pt will demo 4 points improvement on BBS to reduce fall risk    Baseline not assessed on eval (11/29/20); 17/56 (12/14/20); 52/56 (12/30/20)    Time 10    Period Weeks    Status Achieved      PT LONG TERM GOAL #5   Title Patient will demo 1.15 m/s gait speed to significantly improve in her gait speed.    Baseline 1.40m/s (01/27/21)    Time 6    Period Weeks    Status Revised    Target Date 03/10/21                     Patient will benefit from skilled therapeutic intervention in order to improve the following deficits and impairments:     Visit Diagnosis: Muscle weakness (generalized)  Hemiplegia and hemiparesis following cerebral infarction affecting right dominant side (HCC)  Other symptoms and signs involving cognitive functions following cerebral infarction     Problem List Patient Active Problem List   Diagnosis Date Noted   Right hemiplegia [facility])    Urinary retention    Hyperglycemia    Essential hypertension    Dysphagia, post-stroke    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[place]) 11/01/2020   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

## 2021-02-08 NOTE — Therapy (Signed)
[facility] [address] [place], [place], [address] Phone: [phone]   Fax:  [phone]  Physical Therapy Treatment  Patient Details  Name: Brittany Griffith MRN: [id number] Date of Birth: [birth year] Referring Provider (PT): Lauraine Rinne, [place]   Encounter Date: 02/07/2021   PT End of Session - 02/07/21 1434     Visit Number 16    Number of Visits 26    Date for PT Re-Evaluation 03/10/21    Authorization Type [employer]; $30 copay per day for all disciplines, Eval /19/22, 10 visit PN 01/06/21    Authorization Time Period Recert performed 2/95/18-8/41/66    Progress Note Due on Visit 24    PT Start Time 1445    PT Stop Time 1530    PT Time Calculation (min) 45 min    Equipment Utilized During Treatment Gait belt    Activity Tolerance Patient tolerated treatment well             Past Medical History:  Diagnosis Date   Hypercholesteremia    Hypertension    Osteopenia    Stroke [facility])     Past Surgical History:  Procedure Laterality Date   IR ANGIO INTRA EXTRACRAN SEL COM CAROTID INNOMINATE UNI R MOD SED  10/26/2020   IR INTRA CRAN STENT  10/26/2020   IR RADIOLOGIST EVAL & MGMT  12/09/2020   RADIOLOGY WITH ANESTHESIA Left 10/26/2020   Procedure: RADIOLOGY WITH ANESTHESIA LEFT MCA STENT;  Surgeon: Luanne Bras, MD;  Location: [facility];  Service: Radiology;  Laterality: Left;    There were no vitals filed for this visit.   Subjective Assessment - 02/07/21 1334     Subjective Stable symptoms, no new falls, R shoulder pain improved but not resolved    Patient is accompained by: Family member    Limitations Sitting;Lifting;Standing;Walking;House hold activities    How long can you sit comfortably? no issue    How long can you stand comfortably? She can't stand    How long can you walk comfortably? She can walk with family with 25% assistance with waling for 5-10 fet at home    Patient Stated Goals  Get stroonger, walk better, improve indepdnence.    Pain Onset 1 to 4 weeks ago                [facility] PT Assessment - 02/08/21 0001       Transfers   Transfers Sit to Stand    Sit to Stand 6: Modified independent (Device/Increase time)    Comments performed with arms crossed on Airex 15 reps      Ambulation/Gait   Ambulation/Gait Yes    Ambulation/Gait Assistance 6: Modified independent (Device/Increase time)    Ambulation Distance (Feet) 230 Feet    Gait Pattern Step-through pattern;Decreased arm swing - right    Gait Comments 1# wt on R ankle                           [facility] Adult PT Treatment/Exercise - 02/08/21 0001       Lumbar Exercises: Seated   Other Seated Lumbar Exercises performed hip and shoulder tosses, chops to ea. side and Vs using 1/2 kg weight, 10 reps each task, modified grip to accommodate R hand paresis      Knee/Hip Exercises: Aerobic   Other Aerobic Scifit L3 10 no arms      Knee/Hip Exercises: Seated   Long Arc  Quad Both;1 set;15 reps    Long [employer] Limitations with latissimus pressdown    Marching Strengthening;Both;1 set;15 reps    Marching Limitations with lat pressdown                 Balance Exercises - 02/08/21 0001       Balance Exercises: Standing   Heel Raises 15 reps;Limitations    Heel Raises Limitations foam roll for support    Toe Raise Both;15 reps;Limitations    Toe Raise Limitations foam roll for support    Other Standing Exercises standing opposite UE/LE abd, 15x per side, deferred use of RUE                 PT Short Term Goals - 01/27/21 1016       PT SHORT TERM GOAL #1   Title pt will be able to ambulate 300' with quad cane with CGA and without evidence of LOB to improve short distance ambulation.    Baseline 115' quad cane CGA-MinA for occaisonal LOB (11/29/20); ambulated 1050' total without AD, >300 feet in grass with up hill and down hill (12/30/20)    Time 5    Period Weeks     Status Achieved    Target Date 01/03/21      PT SHORT TERM GOAL #2   Title Pt will be able to go up and down12 steps with use of 1 rail and CGA to improve stair negotiation in community    Baseline not tried (11/29/20); 12 steps with using one rail and reciprocal steps (12/30/20)    Time 5    Period Weeks    Status Achieved    Target Date 01/03/21      PT SHORT TERM GOAL #3   Title Pt and family will demo compliance with at least 5 days a week walking program to improve walking endurance and practice gait.    Baseline Household ambulator only (11/29/20); 10-15 min walking (12/30/20)    Time 5    Period Weeks    Status Achieved    Target Date 01/03/21               PT Long Term Goals - 01/27/21 1006       PT LONG TERM GOAL #1   Title Pt will be able to ambulate on non compliant surface for 200 feet with appropriate AD with SBA to improve community negotiation.    Baseline not attempted (eval); >300 feet with SBA/CGA for safety; 01/24/21 Ambulationof 1031ft across uneven ground, grassy surfaces requiring only S    Time 10    Period Weeks    Status Achieved      PT LONG TERM GOAL #2   Title Pt will be able to ambulate 1050' with appropriate AD with SBA to improve community ambulation    Baseline 115' with quad cane CGA to min A (11/29/20); 1050' requires CGA for occaisonal imbalance (12/30/20); 01/24/21 Ambulationof 1058ft across uneven ground, grassy surfaces requiring only S; pt walks 20 min with husband with SBA    Time 10    Period Weeks    Status Achieved      PT LONG TERM GOAL #3   Title Pt will demo 0.81m/s improvement on 10 meter walk test to improve walking speed and improve community ambulation    Baseline Not assessed on eval (11/29/20); 0.61m/s without AD (12/30/20); 1 m/s (01/27/21)    Time 10    Period Weeks    Status  Achieved      PT LONG TERM GOAL #4   Title Pt will demo 4 points improvement on BBS to reduce fall risk    Baseline not assessed on eval (11/29/20);  17/56 (12/14/20); 52/56 (12/30/20)    Time 10    Period Weeks    Status Achieved      PT LONG TERM GOAL #5   Title Patient will demo 1.15 m/s gait speed to significantly improve in her gait speed.    Baseline 1.57m/s (01/27/21)    Time 6    Period Weeks    Status Revised    Target Date 03/10/21                   Plan - 02/07/21 1335     Clinical Impression Statement Todays session focused on continued strengthening of RLE and core, ambulation with 1# weight on ankle, seated core exercises requiring assist at R hand to manipulate and hold object, mild fatigue present in RLE following prolonged tasks but no foot scuff or LOB observed    Personal Factors and Comorbidities Age;Behavior Pattern;Past/Current Experience;Time since onset of injury/illness/exacerbation;Transportation    Examination-Activity Limitations Bathing;Bed Mobility;Bend;Caring for Others;Carry;Dressing;Hygiene/Grooming;Lift;Sleep;Squat;Stairs;Stand;Toileting;Transfers    Examination-Participation Restrictions Cleaning;Community Activity;Driving;Laundry;Medication Management;Meal Prep;Personal Finances;Shop;Yard Work    [employer] Evolving/Moderate complexity    Rehab Potential Good    PT Frequency 2x / week    PT Duration 6 weeks   10 weeks   PT Treatment/Interventions ADLs/Self Care Home Management;Moist Heat;Gait training;Stair training;Functional mobility training;Therapeutic activities;Therapeutic exercise;Balance training;Neuromuscular re-education;Cognitive remediation;Patient/family education;Orthotic Fit/Training;Manual techniques;Passive range of motion;Energy conservation;Visual/perceptual remediation/compensation;Joint Manipulations    PT Next Visit Plan Work on improving gait speed, plyometrics, continue core strengthening and endurance building in RLE    PT Home Exercise Plan Access Code [email or web address]     Consulted and Agree with Plan of Care Patient;Family member/caregiver    Family  Member Consulted son             Patient will benefit from skilled therapeutic intervention in order to improve the following deficits and impairments:  Abnormal gait, Decreased activity tolerance, Decreased cognition, Decreased balance, Decreased coordination, Decreased safety awareness, Decreased range of motion, Decreased mobility, Decreased knowledge of use of DME, Decreased knowledge of precautions, Decreased endurance, Decreased strength, Difficulty walking, Impaired flexibility, Impaired perceived functional ability, Increased fascial restricitons, Impaired sensation, Impaired tone, Impaired UE functional use, Postural dysfunction, Pain  Visit Diagnosis: Muscle weakness (generalized)  Hemiplegia and hemiparesis following cerebral infarction affecting right dominant side (HCC)  Other symptoms and signs involving cognitive functions following cerebral infarction     Problem List Patient Active Problem List   Diagnosis Date Noted   Right hemiplegia [facility])    Urinary retention    Hyperglycemia    Essential hypertension    Dysphagia, post-stroke    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 [place], [place], [address] Phone: [phone]   Fax:  [phone]  Name: Brittany Griffith MRN: [id number] Date of Birth: 12-14-1950

## 2021-02-09 ENCOUNTER — Ambulatory Visit: Payer: Medicare HMO

## 2021-02-09 ENCOUNTER — Ambulatory Visit: Payer: Medicare HMO | Admitting: Occupational Therapy

## 2021-02-09 ENCOUNTER — Other Ambulatory Visit: Payer: Self-pay

## 2021-02-09 DIAGNOSIS — I69318 Other symptoms and signs involving cognitive functions following cerebral infarction: Secondary | ICD-10-CM

## 2021-02-09 DIAGNOSIS — M6281 Muscle weakness (generalized): Secondary | ICD-10-CM

## 2021-02-09 DIAGNOSIS — R208 Other disturbances of skin sensation: Secondary | ICD-10-CM

## 2021-02-09 DIAGNOSIS — R2689 Other abnormalities of gait and mobility: Secondary | ICD-10-CM

## 2021-02-09 DIAGNOSIS — R2681 Unsteadiness on feet: Secondary | ICD-10-CM

## 2021-02-09 DIAGNOSIS — I69351 Hemiplegia and hemiparesis following cerebral infarction affecting right dominant side: Secondary | ICD-10-CM

## 2021-02-09 DIAGNOSIS — R482 Apraxia: Secondary | ICD-10-CM | POA: Diagnosis not present

## 2021-02-09 NOTE — Therapy (Signed)
[facility] [address] [place], [place], [address] Phone: [phone]   Fax:  [phone]  Occupational Therapy Treatment  Patient Details  Name: Brittany Griffith MRN: [id number] Date of Birth: 11-16-1950 No data recorded  Encounter Date: 02/09/2021   OT End of Session - 02/09/21 1519     Visit Number 18    Number of Visits 25    Date for OT Re-Evaluation 03/06/21    Authorization Type [employer] MCR    Authorization - Visit Number 18    Authorization - Number of Visits 20    OT Start Time 5945    OT Stop Time 1558    OT Time Calculation (min) 40 min    Behavior During Therapy [facility] for tasks assessed/performed             Past Medical History:  Diagnosis Date   Hypercholesteremia    Hypertension    Osteopenia    Stroke [facility])     Past Surgical History:  Procedure Laterality Date   IR ANGIO INTRA EXTRACRAN SEL COM CAROTID INNOMINATE UNI R MOD SED  10/26/2020   IR INTRA CRAN STENT  10/26/2020   IR RADIOLOGIST EVAL & MGMT  12/09/2020   RADIOLOGY WITH ANESTHESIA Left 10/26/2020   Procedure: RADIOLOGY WITH ANESTHESIA LEFT MCA STENT;  Surgeon: Luanne Bras, MD;  Location: [facility];  Service: Radiology;  Laterality: Left;    There were no vitals filed for this visit.   Subjective Assessment - 02/09/21 1519     Subjective  Pt denies pain with head nod. Spouse confirms.    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 PMH: HTN, HLD, Meningioma    Currently in Pain? No/denies             Weight bearing in RUE for inhibition of tone and facilitation of muscle activation while reaching with LUE to place semi circle pegs. Pt required mod assistance for maintaining hand position on mat for weight bearing. Pt to obtain peg when to called out color and did so with approx 50% accuracy.  NMR RUE with UE ranger, weight bearing and  AAROM in elbow, shoulder and wrist and hand. Pt with slight activation noted in fingers flex/ext, elbow ext and flex and supination/pronation but very limited.   Weight Bearing  in [place] on chair while seated edge of mat and anterior and posterior shifting for increased facilitation and input into RUE with normal movements with closed chain position.                     OT Short Term Goals - 01/13/21 0945       OT SHORT TERM GOAL #1   Title Pt/family Independent with initial HEP    Time 4    Period Weeks    Status Achieved      OT SHORT TERM GOAL #2   Title Pt/family independent with splint wear and care prn    Time 4    Period Weeks    Status Deferred   deferred at this time d/t no splints or braces issued. Will reinstate if needed 01/13/21     OT SHORT TERM GOAL #3   Title Pt to cut food w/ AE prn    Time 4    Period Weeks    Status Achieved   met in clinic with A/E  OT SHORT TERM GOAL #4   Title Pt/family to id A/E and one handed techniques to assist with BADLs (dressing, bathing, grooming, toileting)    Time 4    Period Weeks    Status Achieved      OT SHORT TERM GOAL #5   Title Pt to demo 25* Rt shoulder flexion in prep for low level reaching    Time 4    Period Weeks    Status On-going   25* with chest elevation presents 01/13/21     OT SHORT TERM GOAL #6   Title Pt to demo 25% finger flexion and 10% finger extension in prep for grasping/releasing Rt hand    Time 4    Period Weeks    Status Achieved   25% finger flexion and approx 10% extension 01/13/21              OT Long Term Goals - 01/13/21 0948       OT LONG TERM GOAL #1   Title Pt to be independent with updated HEP    Time 12    Period Weeks    Status On-going      OT LONG TERM GOAL #2   Title Pt to be mod I with all BADLS using A/E prn (grooming, dressing, bathing, and toileting)    Time 12    Period Weeks    Status On-going      OT LONG TERM GOAL #3   Title Pt to prepare  simple snack, sandwich, and/or microwaveable items mod I level    Time 12    Period Weeks    Status New      OT LONG TERM GOAL #4   Title Pt to perform RUE reaching in low ranges to grasp and release 1-2" objects consistently    Time 12    Period Weeks    Status New      OT LONG TERM GOAL #5   Title Pt to use RUE as min assist for BADLS and bilateral tasks    Time 12    Period Weeks    Status New      OT LONG TERM GOAL #6   Title Pt to improve RUE function as evidenced by performing 6 blocks on Box & Blocks test    Baseline unable, no functional movement    Time 12    Period Weeks    Status New                   Plan - 02/09/21 1609     Clinical Impression Statement Pt continues to progress towards goals. Slow and steady progress towards RUE active movement.    OT Occupational Profile and History Detailed Assessment- Review of Records and additional review of physical, cognitive, psychosocial history related to current functional performance    Occupational performance deficits (Please refer to evaluation for details): ADL's;IADL's;Social Participation;Work;Leisure    Body Structure / Function / Physical Skills ADL;Strength;Pain;Body mechanics;Edema;Proprioception;UE functional use;IADL;ROM;Coordination;Mobility;Sensation;FMC    Cognitive Skills Attention;Understand;Perception    Rehab Potential Good    Clinical Decision Making Several treatment options, min-mod task modification necessary    Comorbidities Affecting Occupational Performance: May have comorbidities impacting occupational performance    Modification or Assistance to Complete Evaluation  Min-Moderate modification of tasks or assist with assess necessary to complete eval    OT Frequency 2x / week    OT Duration 12 weeks    OT Treatment/Interventions Self-care/ADL training;Moist Heat;DME and/or AE instruction;Splinting;Therapeutic  activities;Therapeutic exercise;Cognitive remediation/compensation;Coping  strategies training;Neuromuscular education;Functional Mobility Training;Passive range of motion;Visual/perceptual remediation/compensation;Patient/family education;Manual Therapy;Electrical Stimulation;Energy conservation    Plan Continue NMR,may perform e-stim for finger and wrist extension    Consulted and Agree with Plan of Care Patient;Family member/caregiver    Family Member Consulted son             Patient will benefit from skilled therapeutic intervention in order to improve the following deficits and impairments:   Body Structure / Function / Physical Skills: ADL, Strength, Pain, Body mechanics, Edema, Proprioception, UE functional use, IADL, ROM, Coordination, Mobility, Sensation, [facility] Cognitive Skills: Attention, Understand, Perception     Visit Diagnosis: Muscle weakness (generalized)  Other symptoms and signs involving cognitive functions following cerebral infarction  Hemiplegia and hemiparesis following cerebral infarction affecting right dominant side (HCC)  Unsteadiness on feet  Other abnormalities of gait and mobility  Other disturbances of skin sensation    Problem List Patient Active Problem List   Diagnosis Date Noted   Right hemiplegia (HCC)    Urinary retention    Hyperglycemia    Essential hypertension    Dysphagia, post-stroke    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 [place], [place], [address] Phone: [phone]   Fax:   [phone]  Name: Brittany Griffith MRN: [id number] Date of Birth: 1950-11-15

## 2021-02-12 NOTE — Addendum Note (Signed)
Encounter addended by: Annie Paras on: 02/12/2021 3:54 PM  Actions taken: Letter saved

## 2021-02-16 ENCOUNTER — Encounter: Payer: Medicare HMO | Admitting: Occupational Therapy

## 2021-02-16 ENCOUNTER — Ambulatory Visit: Payer: Medicare HMO

## 2021-02-17 ENCOUNTER — Encounter: Payer: Medicare HMO | Admitting: Occupational Therapy

## 2021-02-21 ENCOUNTER — Encounter: Payer: Medicare HMO | Admitting: Occupational Therapy

## 2021-02-21 ENCOUNTER — Ambulatory Visit: Payer: Medicare HMO

## 2021-02-21 ENCOUNTER — Ambulatory Visit: Payer: Medicare HMO | Attending: Physician Assistant | Admitting: Occupational Therapy

## 2021-02-21 ENCOUNTER — Other Ambulatory Visit: Payer: Self-pay

## 2021-02-21 DIAGNOSIS — M6281 Muscle weakness (generalized): Secondary | ICD-10-CM | POA: Insufficient documentation

## 2021-02-21 DIAGNOSIS — R2681 Unsteadiness on feet: Secondary | ICD-10-CM

## 2021-02-21 DIAGNOSIS — I69351 Hemiplegia and hemiparesis following cerebral infarction affecting right dominant side: Secondary | ICD-10-CM | POA: Insufficient documentation

## 2021-02-21 DIAGNOSIS — R41841 Cognitive communication deficit: Secondary | ICD-10-CM | POA: Insufficient documentation

## 2021-02-21 DIAGNOSIS — R4701 Aphasia: Secondary | ICD-10-CM | POA: Insufficient documentation

## 2021-02-21 DIAGNOSIS — R208 Other disturbances of skin sensation: Secondary | ICD-10-CM | POA: Insufficient documentation

## 2021-02-21 DIAGNOSIS — I69318 Other symptoms and signs involving cognitive functions following cerebral infarction: Secondary | ICD-10-CM

## 2021-02-21 DIAGNOSIS — R482 Apraxia: Secondary | ICD-10-CM | POA: Diagnosis present

## 2021-02-21 DIAGNOSIS — R2689 Other abnormalities of gait and mobility: Secondary | ICD-10-CM | POA: Diagnosis present

## 2021-02-21 NOTE — Therapy (Signed)
[facility] [address] [place], [place], [address] Phone: [phone]   Fax:  [phone]  Occupational Therapy Treatment  Patient Details  Name: Brittany Griffith MRN: [id number] Date of Birth: 06/27/1951 No data recorded  Encounter Date: 02/21/2021   OT End of Session - 02/21/21 1311     Visit Number 19    Number of Visits 25    Date for OT Re-Evaluation 03/06/21    Authorization Type [employer] MCR    Authorization - Visit Number 18    Authorization - Number of Visits 20    OT Start Time 1230    OT Stop Time 1315    OT Time Calculation (min) 45 min    Activity Tolerance Patient tolerated treatment well    Behavior During Therapy [facility] for tasks assessed/performed             Past Medical History:  Diagnosis Date   Hypercholesteremia    Hypertension    Osteopenia    Stroke [facility])     Past Surgical History:  Procedure Laterality Date   IR ANGIO INTRA EXTRACRAN SEL COM CAROTID INNOMINATE UNI R MOD SED  10/26/2020   IR INTRA CRAN STENT  10/26/2020   IR RADIOLOGIST EVAL & MGMT  12/09/2020   RADIOLOGY WITH ANESTHESIA Left 10/26/2020   Procedure: RADIOLOGY WITH ANESTHESIA LEFT MCA STENT;  Surgeon: Luanne Bras, MD;  Location: [facility];  Service: Radiology;  Laterality: Left;    There were no vitals filed for this visit.   Subjective Assessment - 02/21/21 1232     Subjective  Pt denies pain with head nod. Spouse confirms.    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 PMH: HTN, HLD, Meningioma    Currently in Pain? No/denies             Supine: worked on elbow flexion against gravity, then elbow extension against gravity with only min facilitation required. Attempted chest press motion with max assist required RUE Sidelying: AA/ROM in sh flex/ext and scapula retraction/protraction w/ min facilitation to prevent  compensations and for tactile cues at scapula.  Returned to supine for chest press motion and could do with only min assist.  Seated: BUE AA/ROM in low and mid level ranges.  NMES x 10 min for wrist and finger extension (previous parameters)                        OT Short Term Goals - 01/13/21 0945       OT SHORT TERM GOAL #1   Title Pt/family Independent with initial HEP    Time 4    Period Weeks    Status Achieved      OT SHORT TERM GOAL #2   Title Pt/family independent with splint wear and care prn    Time 4    Period Weeks    Status Deferred   deferred at this time d/t no splints or braces issued. Will reinstate if needed 01/13/21     OT SHORT TERM GOAL #3   Title Pt to cut food w/ AE prn    Time 4    Period Weeks    Status Achieved   met in clinic with A/E     OT SHORT TERM GOAL #4   Title Pt/family to id A/E and one handed techniques to assist with BADLs (dressing,  bathing, grooming, toileting)    Time 4    Period Weeks    Status Achieved      OT SHORT TERM GOAL #5   Title Pt to demo 25* Rt shoulder flexion in prep for low level reaching    Time 4    Period Weeks    Status On-going   25* with chest elevation presents 01/13/21     OT SHORT TERM GOAL #6   Title Pt to demo 25% finger flexion and 10% finger extension in prep for grasping/releasing Rt hand    Time 4    Period Weeks    Status Achieved   25% finger flexion and approx 10% extension 01/13/21              OT Long Term Goals - 01/13/21 0948       OT LONG TERM GOAL #1   Title Pt to be independent with updated HEP    Time 12    Period Weeks    Status On-going      OT LONG TERM GOAL #2   Title Pt to be mod I with all BADLS using A/E prn (grooming, dressing, bathing, and toileting)    Time 12    Period Weeks    Status On-going      OT LONG TERM GOAL #3   Title Pt to prepare simple snack, sandwich, and/or microwaveable items mod I level    Time 12    Period Weeks    Status  New      OT LONG TERM GOAL #4   Title Pt to perform RUE reaching in low ranges to grasp and release 1-2" objects consistently    Time 12    Period Weeks    Status New      OT LONG TERM GOAL #5   Title Pt to use RUE as min assist for BADLS and bilateral tasks    Time 12    Period Weeks    Status New      OT LONG TERM GOAL #6   Title Pt to improve RUE function as evidenced by performing 6 blocks on Box & Blocks test    Baseline unable, no functional movement    Time 12    Period Weeks    Status New                   Plan - 02/21/21 1311     Clinical Impression Statement Slow and steady progress towards RUE active assist movement - improves w/ repetition but fatigues quickly    OT Occupational Profile and History Detailed Assessment- Review of Records and additional review of physical, cognitive, psychosocial history related to current functional performance    Occupational performance deficits (Please refer to evaluation for details): ADL's;IADL's;Social Participation;Work;Leisure    Body Structure / Function / Physical Skills ADL;Strength;Pain;Body mechanics;Edema;Proprioception;UE functional use;IADL;ROM;Coordination;Mobility;Sensation;FMC    Cognitive Skills Attention;Understand;Perception    Rehab Potential Good    Clinical Decision Making Several treatment options, min-mod task modification necessary    Comorbidities Affecting Occupational Performance: May have comorbidities impacting occupational performance    Modification or Assistance to Complete Evaluation  Min-Moderate modification of tasks or assist with assess necessary to complete eval    OT Frequency 2x / week    OT Duration 12 weeks    OT Treatment/Interventions Self-care/ADL training;Moist Heat;DME and/or AE instruction;Splinting;Therapeutic activities;Therapeutic exercise;Cognitive remediation/compensation;Coping strategies training;Neuromuscular education;Functional Mobility Training;Passive range of  motion;Visual/perceptual remediation/compensation;Patient/family education;Manual Therapy;Electrical Stimulation;Energy conservation  Plan current POC ends 03/06/21 - to further discuss and begin assessing goals. Continue NMR and estim for wrist/finger extension    Consulted and Agree with Plan of Care Patient;Family member/caregiver    Family Member Consulted son             Patient will benefit from skilled therapeutic intervention in order to improve the following deficits and impairments:   Body Structure / Function / Physical Skills: ADL, Strength, Pain, Body mechanics, Edema, Proprioception, UE functional use, IADL, ROM, Coordination, Mobility, Sensation, [facility] Cognitive Skills: Attention, Understand, Perception     Visit Diagnosis: Hemiplegia and hemiparesis following cerebral infarction affecting right dominant side (HCC)  Muscle weakness (generalized)    Problem List Patient Active Problem List   Diagnosis Date Noted   Right hemiplegia (HCC)    Urinary retention    Hyperglycemia    Essential hypertension    Dysphagia, post-stroke    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 [place], [place], [address] Phone: [phone]   Fax:  [phone]  Name: Brittany Griffith MRN: [id number] Date of Birth: 02/09/1951

## 2021-02-21 NOTE — Therapy (Signed)
[facility] [address] [place], [place], [address] Phone: [phone]   Fax:  [phone]  Physical Therapy Treatment  Patient Details  Name: Brittany Griffith MRN: [id number] Date of Birth: 19-Apr-1951 Referring Provider (PT): Lauraine Rinne, [place]   Encounter Date: 02/21/2021   PT End of Session - 02/21/21 1150     Visit Number 17    Number of Visits 26    Date for PT Re-Evaluation 03/10/21    Authorization Type [employer]; $30 copay per day for all disciplines, Eval /19/22, 10 visit PN 01/06/21    Authorization Time Period Recert performed 7/89/38-08/13/73    Progress Note Due on Visit 24    PT Start Time 1145    PT Stop Time 1230    PT Time Calculation (min) 45 min    Equipment Utilized During Treatment Gait belt    Activity Tolerance Patient tolerated treatment well             Past Medical History:  Diagnosis Date   Hypercholesteremia    Hypertension    Osteopenia    Stroke [facility])     Past Surgical History:  Procedure Laterality Date   IR ANGIO INTRA EXTRACRAN SEL COM CAROTID INNOMINATE UNI R MOD SED  10/26/2020   IR INTRA CRAN STENT  10/26/2020   IR RADIOLOGIST EVAL & MGMT  12/09/2020   RADIOLOGY WITH ANESTHESIA Left 10/26/2020   Procedure: RADIOLOGY WITH ANESTHESIA LEFT MCA STENT;  Surgeon: Luanne Bras, MD;  Location: [facility];  Service: Radiology;  Laterality: Left;    There were no vitals filed for this visit.   Subjective Assessment - 02/21/21 1155     Subjective No new issues. They have the new SLP device.    Patient is accompained by: Family member    Limitations Sitting;Lifting;Standing;Walking;House hold activities    How long can you sit comfortably? no issue    How long can you stand comfortably? She can't stand    How long can you walk comfortably? She can walk with family with 25% assistance with waling for 5-10 fet at home    Patient Stated Goals Get stroonger, walk better,  improve indepdnence.    Pain Onset 1 to 4 weeks ago                [facility] PT Assessment - 02/21/21 0001       Ambulation/Gait   Gait velocity 1.04 m/s no AD             Gait training: 2 x 240' with verbal, tactile cues to facilitate R arm swings. Standing in front  of mirror: shoulder protractions and retractions: 20x, arm swings with combined shoulder protractions and retractions: 20x Step ladder drills: - alternating steps: 4 laps - step to: 2 laps each - in in out out: fwd directions, bwd directions, lateral: 2x each Standin gon floor: bean bag toss: - random bag at specific color stated by PT: poor success rate - specific bag color stated by PT to same color dot on floor: poor success rate.                          PT Short Term Goals - 01/27/21 1016       PT SHORT TERM GOAL #1   Title pt will be able to ambulate 300' with quad cane with CGA and without evidence of LOB to improve short distance ambulation.  Baseline 115' quad cane CGA-MinA for occaisonal LOB (11/29/20); ambulated 1050' total without AD, >300 feet in grass with up hill and down hill (12/30/20)    Time 5    Period Weeks    Status Achieved    Target Date 01/03/21      PT SHORT TERM GOAL #2   Title Pt will be able to go up and down12 steps with use of 1 rail and CGA to improve stair negotiation in community    Baseline not tried (11/29/20); 12 steps with using one rail and reciprocal steps (12/30/20)    Time 5    Period Weeks    Status Achieved    Target Date 01/03/21      PT SHORT TERM GOAL #3   Title Pt and family will demo compliance with at least 5 days a week walking program to improve walking endurance and practice gait.    Baseline Household ambulator only (11/29/20); 10-15 min walking (12/30/20)    Time 5    Period Weeks    Status Achieved    Target Date 01/03/21               PT Long Term Goals - 02/21/21 1159       PT LONG TERM GOAL #1   Title Pt will  be able to ambulate on non compliant surface for 200 feet with appropriate AD with SBA to improve community negotiation.    Baseline not attempted (eval); >300 feet with SBA/CGA for safety; 01/24/21 Ambulationof 1059ft across uneven ground, grassy surfaces requiring only S    Time 10    Period Weeks    Status Achieved      PT LONG TERM GOAL #2   Title Pt will be able to ambulate 1050' with appropriate AD with SBA to improve community ambulation    Baseline 115' with quad cane CGA to min A (11/29/20); 1050' requires CGA for occaisonal imbalance (12/30/20); 01/24/21 Ambulationof 1043ft across uneven ground, grassy surfaces requiring only S; pt walks 20 min with husband with SBA    Time 10    Period Weeks    Status Achieved      PT LONG TERM GOAL #3   Title Pt will demo 0.73m/s improvement on 10 meter walk test to improve walking speed and improve community ambulation    Baseline Not assessed on eval (11/29/20); 0.74m/s without AD (12/30/20); 1 m/s (01/27/21)    Time 10    Period Weeks    Status Achieved      PT LONG TERM GOAL #4   Title Pt will demo 4 points improvement on BBS to reduce fall risk    Baseline not assessed on eval (11/29/20); 17/56 (12/14/20); 52/56 (12/30/20)    Time 10    Period Weeks    Status Achieved      PT LONG TERM GOAL #5   Title Patient will demo 1.15 m/s gait speed to significantly improve in her gait speed.    Baseline 1.71m/s (01/27/21); 1.28m/s (02/21/21)    Time 6    Period Weeks    Status Revised    Target Date 03/10/21                   Plan - 02/21/21 1159     Clinical Impression Statement Today's session was focused on gait training with arm swings with R UE. Patient was given verbal, tactile and visual cues to improve arm swings and improve proximal shoulder control. Then we  focused on coordination and following directions with step ladder exercises. Pt was given 1-2 demonstrations and pt was able to complete the exercises with good accuracy. Then we  practiced throwing random color bean bags at specific colors and also specific color bags to specific colors but asked patient to pick specific color from bucket. Patient is still having difficulty with recognizing correct color.    Personal Factors and Comorbidities Age;Behavior Pattern;Past/Current Experience;Time since onset of injury/illness/exacerbation;Transportation    Examination-Activity Limitations Bathing;Bed Mobility;Bend;Caring for Others;Carry;Dressing;Hygiene/Grooming;Lift;Sleep;Squat;Stairs;Stand;Toileting;Transfers    Examination-Participation Restrictions Cleaning;Community Activity;Driving;Laundry;Medication Management;Meal Prep;Personal Finances;Shop;Yard Work    [employer] Evolving/Moderate complexity    Rehab Potential Good    PT Frequency 2x / week    PT Duration 6 weeks   10 weeks   PT Treatment/Interventions ADLs/Self Care Home Management;Moist Heat;Gait training;Stair training;Functional mobility training;Therapeutic activities;Therapeutic exercise;Balance training;Neuromuscular re-education;Cognitive remediation;Patient/family education;Orthotic Fit/Training;Manual techniques;Passive range of motion;Energy conservation;Visual/perceptual remediation/compensation;Joint Manipulations    PT Next Visit Plan Assess LTG, discharge next session    PT Home Exercise Plan Access Code [email or web address]     Consulted and Agree with Plan of Care Patient;Family member/caregiver    Family Member Consulted son             Patient will benefit from skilled therapeutic intervention in order to improve the following deficits and impairments:  Abnormal gait, Decreased activity tolerance, Decreased cognition, Decreased balance, Decreased coordination, Decreased safety awareness, Decreased range of motion, Decreased mobility, Decreased knowledge of use of DME, Decreased knowledge of precautions, Decreased endurance, Decreased strength, Difficulty walking, Impaired flexibility,  Impaired perceived functional ability, Increased fascial restricitons, Impaired sensation, Impaired tone, Impaired UE functional use, Postural dysfunction, Pain  Visit Diagnosis: Muscle weakness (generalized)  Other symptoms and signs involving cognitive functions following cerebral infarction  Hemiplegia and hemiparesis following cerebral infarction affecting right dominant side (HCC)  Unsteadiness on feet  Other abnormalities of gait and mobility     Problem List Patient Active Problem List   Diagnosis Date Noted   Right hemiplegia ([place])    Urinary retention    Hyperglycemia    Essential hypertension    Dysphagia, post-stroke    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 [place]) 11/01/2020   Dysphagia 11/01/2020   Middle cerebral artery stenosis, left 10/26/2020   Acute CVA (cerebrovascular accident) ([place]) 10/24/2020   left MCA stroke 10/23/2020   Meningioma ([facility]) 10/23/2020   Cerebral edema ([place]) 10/23/2020   Global aphasia 10/23/2020   Hypertensive urgency 10/23/2020    Kerrie Pleasure, PT 02/21/2021, 12:45 PM  [facility] [address] [place], [place], [address] Phone: [phone]   Fax:  [phone]  Name: Brittany Griffith MRN: [id number] Date of Birth: January 21, 1951

## 2021-02-23 ENCOUNTER — Encounter: Payer: Medicare HMO | Attending: Registered Nurse | Admitting: Physical Medicine & Rehabilitation

## 2021-02-23 ENCOUNTER — Encounter: Payer: Self-pay | Admitting: Physical Medicine & Rehabilitation

## 2021-02-23 ENCOUNTER — Other Ambulatory Visit: Payer: Self-pay

## 2021-02-23 VITALS — BP 129/69 | HR 74 | Temp 97.8°F | Ht 60.0 in | Wt 131.6 lb

## 2021-02-23 DIAGNOSIS — M7551 Bursitis of right shoulder: Secondary | ICD-10-CM | POA: Insufficient documentation

## 2021-02-23 DIAGNOSIS — G8191 Hemiplegia, unspecified affecting right dominant side: Secondary | ICD-10-CM | POA: Insufficient documentation

## 2021-02-23 NOTE — Progress Notes (Signed)
Subjective:    Patient ID: Brittany Griffith, female    DOB: 1951/06/22, 70 y.o.   MRN: [id number]  70 y.o. right-handed limited English speaking female with hyperlipidemia as well as hypertension.  Per chart review lives with spouse and independent prior to admission.  1 level home one-step to entry.  Presented 10/23/2020 with right side weakness headache as well as slurred speech.  Noted blood pressure 200/77.  CT angiogram of head and neck showed left frontal parafalcine mass with extensive regional hyperostosis possibly reflecting en plaque meningioma.  Mild edema in the left frontal lobe.  Trace local rightward midline shift.  No large vessel occlusion.  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  2.6 cm enhancing left frontal parafalcine mass consistent with meningioma minimal rightward midline shift.  Neurosurgery Dr. Venetia Constable follow-up in regards to meningioma felt to be slow-growing and would follow-up outpatient after initial evaluation by neurology services for CVA.  EEG negative for seizure maintained on Keppra for seizure prophylaxis.  Echocardiogram with ejection fraction of 60 to 65% no wall motion abnormalities.  Patient underwent cerebral angiogram 10/26/2020 followed by stent assisted angioplasty of left MCA with T TICI revascularization per interventional radiology.  Currently maintained on aspirin 81 mg daily and Brilinta 90 mg twice daily post stenting.  Cleared to begin Lovenox for DVT prophylaxis.  She was maintained on 3% saline for her cerebral edema and monitoring of sodium levels.  On 10/30/2020 patient with questionable increasing weakness right upper extremity MRI/MRI completed showing considerable extension of left middle cerebral artery territory infarction involving the vast majority of the cortical and subcortical distribution of the left middle cerebral artery with sparing of the basal ganglia and patient remained on aspirin as well  as Brilinta.    Admit date: 11/01/2020 Discharge date: 11/22/2020 HPI Patient accompanied by husband who is doing the interpreting. Patient overall is functioning better.  She requires some assistance yet for dressing and bathing but not as much as she used to.  She ambulates without assistive device not requiring supervision. The patient is continuing with outpatient OT and speech, PT is weaning down because the patient is doing so well with her right lower extremity strength as well as mobility. Pain Inventory Average Pain 4 Pain Right Now 4 My pain is aching  LOCATION OF PAIN  shoulder  BOWEL Number of stools per week: 7 Oral laxative use Yes  Type of laxative miralax Enema or suppository use No  History of colostomy No  Incontinent No   BLADDER Normal In and out cath, frequency n/a Able to self cath  n/a Bladder incontinence No  Frequent urination No  Leakage with coughing No  Difficulty starting stream No  Incomplete bladder emptying No    Mobility walk without assistance how many minutes can you walk? 20 mins ability to climb steps?  yes  Function not employed: date last employed 10/21/2020 retired I need assistance with the following:  meal prep and household duties  Neuro/Psych weakness  Prior Studies N/a  Physicians involved in your care N/a   Family History  Problem Relation Age of Onset   Stroke Father    Heart attack Father    Kidney disease Brother    Social History   Socioeconomic History   Marital status: Married    Spouse name: Not on file   Number of children: Not on file   Years of education: Not on file   Highest  education level: Not on file  Occupational History   Not on file  Tobacco Use   Smoking status: Never   Smokeless tobacco: Never  Substance and Sexual Activity   Alcohol use: Not Currently   Drug use: Not Currently   Sexual activity: Not on file  Other Topics Concern   Not on file  Social History Narrative   Not  on file   Social Determinants of Health   Financial Resource Strain: Not on file  Food Insecurity: Not on file  Transportation Needs: Not on file  Physical Activity: Not on file  Stress: Not on file  Social Connections: Not on file   Past Surgical History:  Procedure Laterality Date   IR ANGIO INTRA EXTRACRAN SEL COM CAROTID INNOMINATE UNI R MOD SED  10/26/2020   IR [place]  10/26/2020   IR RADIOLOGIST EVAL & MGMT  12/09/2020   RADIOLOGY WITH ANESTHESIA Left 10/26/2020   Procedure: RADIOLOGY WITH ANESTHESIA LEFT MCA STENT;  Surgeon: Luanne Bras, MD;  Location: [facility];  Service: Radiology;  Laterality: Left;   Past Medical History:  Diagnosis Date   Hypercholesteremia    Hypertension    Osteopenia    Stroke ([place] [facility])    BP 129/69 (BP Location: Right Arm)   Pulse 74   Temp 97.8 F (36.6 C) (Oral)   Ht 5' (1.524 m)   Wt 131 lb 9.6 oz (59.7 kg)   SpO2 97%   BMI 25.70 kg/m   Opioid Risk Score:   Fall Risk Score:  `1  Depression screen PHQ 2/9  Depression screen [facility] 2/9 02/23/2021 12/20/2020  Decreased Interest 0 0  Down, Depressed, Hopeless 0 0  PHQ - 2 Score 0 0      Review of Systems  Constitutional: Negative.   HENT: Negative.    Eyes: Negative.   Respiratory:  Positive for shortness of breath.   Cardiovascular: Negative.   Gastrointestinal: Negative.   Endocrine: Negative.   Genitourinary: Negative.   Musculoskeletal:        Shoulder pain  Skin: Negative.   Allergic/Immunologic: Negative.   Neurological:  Positive for weakness.  Psychiatric/Behavioral: Negative.        Objective:   Physical Exam Vitals and nursing note reviewed.  Constitutional:      Appearance: She is normal weight.  HENT:     Head: Normocephalic and atraumatic.  Eyes:     Extraocular Movements: Extraocular movements intact.     Conjunctiva/sclera: Conjunctivae normal.     Pupils: Pupils are equal, round, and reactive to light.  Musculoskeletal:     Comments: Positive  impingement sign right shoulder no pain with external rotation, no swelling in the hand or wrist.  No skin discoloration.  Skin:    General: Skin is warm and dry.  Neurological:     Mental Status: She is alert.     Motor: Abnormal muscle tone present.     Comments: Patient remains aphasic per husband basically sticks to her yes no responses, speaks no English  Reduced tone right upper extremity. Motor strength right upper extremity is trace at the shoulder as well as bicep.  0 at the tricep 0 at the finger flexors and extensors 0 at the wrist Right lower extremity 4/5 in the right hip flexor knee extensor ankle dorsiflexor.  Psychiatric:        Mood and Affect: Mood normal.        Behavior: Behavior normal.  Assessment & Plan:   1.  Left MCA distribution infarct with residual right hemiparesis affecting mainly the right upper extremity as well as aphasia.  She is making good progress in terms of her functional status still does not have much functional recovery of the right upper extremity.  We will continue outpatient OT We will continue outpatient speech for her expressive aphasia  2.  Right shoulder impingement sign with pain, will repeat right shoulder injection  Shoulder injection right subacromial   Indication: Right shoulder pain not relieved by medication management and other conservative care.  Informed consent was obtained after describing risks and benefits of the procedure with the patient, this includes bleeding, bruising, infection and medication side effects. The patient wishes to proceed and has given written consent. Patient was placed in a seated position. The right shoulder was marked and prepped with betadine in the subacromial area. A 25-gauge 1-1/2 inch needle was inserted into the subacromial area. After negative draw back for blood, a solution containing 1 mL of 6 mg per ML betamethasone and 4 mL of 1% lidocaine was injected. A band aid was applied. The  patient tolerated the procedure well. Post procedure instructions were given.

## 2021-02-27 ENCOUNTER — Encounter: Payer: Medicare HMO | Admitting: Occupational Therapy

## 2021-02-27 ENCOUNTER — Ambulatory Visit: Payer: Medicare HMO

## 2021-03-01 ENCOUNTER — Ambulatory Visit: Payer: Medicare HMO

## 2021-03-01 ENCOUNTER — Ambulatory Visit: Payer: Medicare HMO | Admitting: Occupational Therapy

## 2021-03-01 ENCOUNTER — Encounter: Payer: Medicare HMO | Admitting: Occupational Therapy

## 2021-03-02 ENCOUNTER — Encounter: Payer: Medicare HMO | Admitting: Occupational Therapy

## 2021-03-03 ENCOUNTER — Encounter: Payer: Self-pay | Admitting: Occupational Therapy

## 2021-03-03 ENCOUNTER — Ambulatory Visit: Payer: Medicare HMO | Admitting: Occupational Therapy

## 2021-03-03 ENCOUNTER — Other Ambulatory Visit: Payer: Self-pay

## 2021-03-03 DIAGNOSIS — R208 Other disturbances of skin sensation: Secondary | ICD-10-CM

## 2021-03-03 DIAGNOSIS — I69351 Hemiplegia and hemiparesis following cerebral infarction affecting right dominant side: Secondary | ICD-10-CM

## 2021-03-03 DIAGNOSIS — R2689 Other abnormalities of gait and mobility: Secondary | ICD-10-CM

## 2021-03-03 DIAGNOSIS — R2681 Unsteadiness on feet: Secondary | ICD-10-CM

## 2021-03-03 DIAGNOSIS — I69318 Other symptoms and signs involving cognitive functions following cerebral infarction: Secondary | ICD-10-CM

## 2021-03-03 DIAGNOSIS — M6281 Muscle weakness (generalized): Secondary | ICD-10-CM

## 2021-03-03 NOTE — Therapy (Signed)
[facility] [address] [place], [place], [address] Phone: [phone]   Fax:  [phone]  Occupational Therapy Treatment & Discharge  Patient Details  Name: Brittany Griffith MRN: [id number] Date of Birth: 1950-09-10 No data recorded  Encounter Date: 03/03/2021   OT End of Session - 03/03/21 1105     Visit Number 20    Number of Visits 25    Date for OT Re-Evaluation 03/06/21    Authorization Type [employer] MCR    Authorization - Visit Number 39    Authorization - Number of Visits 20    OT Start Time 1104    OT Stop Time 1145    OT Time Calculation (min) 41 min    Activity Tolerance Patient tolerated treatment well    Behavior During Therapy [facility] for tasks assessed/performed             Past Medical History:  Diagnosis Date   Hypercholesteremia    Hypertension    Osteopenia    Stroke [facility])     Past Surgical History:  Procedure Laterality Date   IR ANGIO INTRA EXTRACRAN SEL COM CAROTID INNOMINATE UNI R MOD SED  10/26/2020   IR INTRA CRAN STENT  10/26/2020   IR RADIOLOGIST EVAL & MGMT  12/09/2020   RADIOLOGY WITH ANESTHESIA Left 10/26/2020   Procedure: RADIOLOGY WITH ANESTHESIA LEFT MCA STENT;  Surgeon: Luanne Bras, MD;  Location: [facility];  Service: Radiology;  Laterality: Left;    There were no vitals filed for this visit.   Subjective Assessment - 03/03/21 1104     Subjective  "most of the things she can do by herself but our main concern is with her right hand" per spouse.    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 PMH: HTN, HLD, Meningioma    Currently in Pain? Yes   per spouse - hurts when moving (RUE)            [place] SUMMARY  Visits from Start of Care: 20  Current functional level related to goals / functional outcomes: Pt with slow and steady progress during time with  OT. Pt with little gains with active movement in RUE but progressed independence and participation in ADLs and IADLs.    Remaining deficits: Very little activation in RUE - severe hemiplegia    Education / Equipment: HEPs, stretches   Patient agrees to discharge. Patient goals were partially met. Patient is being discharged due to being pleased with the current functional level.. Educated patient and spouse on returning in 3-6 months pending progress and need for OT.                OT Treatments/Exercises (OP) - 03/03/21 1138       Neurological Re-education Exercises   Grasp and Release Thumb Opposition    Other Grasp and Release Exercises  worked on grasp and release of 1 inch blocks and small and medium cones. Pt unable to facilitated grasp and release without min to moderate assistance for thumb opposition and palmar abduction and sustained grasp. Pt with active supination and pronation during task.      Modalities   Modalities Teacher, English as a foreign language Location RUE wrist and finger extensors    Electrical Stimulation Action good response from NMES    Electrical Stimulation Parameters NMES small  muscle x 10 minutes level 20    Electrical Stimulation Goals Neuromuscular facilitation                      OT Short Term Goals - 03/03/21 1107       OT SHORT TERM GOAL #1   Title Pt/family Independent with initial HEP    Time 4    Period Weeks    Status Achieved      OT SHORT TERM GOAL #2   Title Pt/family independent with splint wear and care prn    Time 4    Period Weeks    Status Deferred   deferred at this time d/t no splints or braces issued. Will reinstate if needed 01/13/21     OT SHORT TERM GOAL #3   Title Pt to cut food w/ AE prn    Time 4    Period Weeks    Status Achieved   met in clinic with A/E     OT SHORT TERM GOAL #4   Title Pt/family to id A/E and one handed techniques to assist  with BADLs (dressing, bathing, grooming, toileting)    Time 4    Period Weeks    Status Achieved      OT SHORT TERM GOAL #5   Title Pt to demo 25* Rt shoulder flexion in prep for low level reaching    Time 4    Period Weeks    Status Not Met   25* with chest elevation presents 01/13/21     OT SHORT TERM GOAL #6   Title Pt to demo 25% finger flexion and 10% finger extension in prep for grasping/releasing Rt hand    Time 4    Period Weeks    Status Achieved   25% finger flexion and approx 10% extension 01/13/21              OT Long Term Goals - 03/03/21 1107       OT LONG TERM GOAL #1   Title Pt to be independent with updated HEP    Time 12    Period Weeks    Status Achieved      OT LONG TERM GOAL #2   Title Pt to be mod I with all BADLS using A/E prn (grooming, dressing, bathing, and toileting)    Time 12    Period Weeks    Status Achieved      OT LONG TERM GOAL #3   Title Pt to prepare simple snack, sandwich, and/or microwaveable items mod I level    Time 12    Period Weeks    Status Achieved      OT LONG TERM GOAL #4   Title Pt to perform RUE reaching in low ranges to grasp and release 1-2" objects consistently    Time 12    Period Weeks    Status Not Met      OT LONG TERM GOAL #5   Title Pt to use RUE as min assist for BADLS and bilateral tasks    Time 12    Period Weeks    Status Not Met   unable to use RUE as assist     OT LONG TERM GOAL #6   Title Pt to improve RUE function as evidenced by performing 6 blocks on Box & Blocks test    Baseline unable, no functional movement    Time 12    Period Weeks    Status  Not Met   unable to grasp/release 1 inch blocks at this time.                  Plan - 03/03/21 1133     Clinical Impression Statement Pt is discharging from OT this day and discussed returning in 3-6 months for another evaluation and assess    OT Occupational Profile and History Detailed Assessment- Review of Records and additional  review of physical, cognitive, psychosocial history related to current functional performance    Occupational performance deficits (Please refer to evaluation for details): ADL's;IADL's;Social Participation;Work;Leisure    Body Structure / Function / Physical Skills ADL;Strength;Pain;Body mechanics;Edema;Proprioception;UE functional use;IADL;ROM;Coordination;Mobility;Sensation;FMC    Cognitive Skills Attention;Understand;Perception    Rehab Potential Good    Clinical Decision Making Several treatment options, min-mod task modification necessary    Comorbidities Affecting Occupational Performance: May have comorbidities impacting occupational performance    Modification or Assistance to Complete Evaluation  Min-Moderate modification of tasks or assist with assess necessary to complete eval    OT Frequency 2x / week    OT Duration 12 weeks    OT Treatment/Interventions Self-care/ADL training;Moist Heat;DME and/or AE instruction;Splinting;Therapeutic activities;Therapeutic exercise;Cognitive remediation/compensation;Coping strategies training;Neuromuscular education;Functional Mobility Training;Passive range of motion;Visual/perceptual remediation/compensation;Patient/family education;Manual Therapy;Electrical Stimulation;Energy conservation    Plan d/c OT at this time and discussed returning in 3-6 months for another evaluation and to see how progressing, if needed.    Consulted and Agree with Plan of Care Patient;Family member/caregiver    Family Member Consulted son             Patient will benefit from skilled therapeutic intervention in order to improve the following deficits and impairments:   Body Structure / Function / Physical Skills: ADL, Strength, Pain, Body mechanics, Edema, Proprioception, UE functional use, IADL, ROM, Coordination, Mobility, Sensation, [facility] Cognitive Skills: Attention, Understand, Perception     Visit Diagnosis: Muscle weakness (generalized)  Hemiplegia and  hemiparesis following cerebral infarction affecting right dominant side (HCC)  Other symptoms and signs involving cognitive functions following cerebral infarction  Unsteadiness on feet  Other abnormalities of gait and mobility  Other disturbances of skin sensation    Problem List Patient Active Problem List   Diagnosis Date Noted   Right hemiplegia (HCC)    Urinary retention    Hyperglycemia    Essential hypertension    Dysphagia, post-stroke    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 [address] [place], [place], [address] Phone: [phone]   Fax:  [phone]  Name: Brittany Griffith MRN: [id number] Date of Birth: 09-05-1950

## 2021-03-07 ENCOUNTER — Ambulatory Visit: Payer: Medicare HMO

## 2021-03-07 ENCOUNTER — Other Ambulatory Visit: Payer: Self-pay

## 2021-03-07 DIAGNOSIS — R482 Apraxia: Secondary | ICD-10-CM

## 2021-03-07 DIAGNOSIS — R4701 Aphasia: Secondary | ICD-10-CM

## 2021-03-07 DIAGNOSIS — I69351 Hemiplegia and hemiparesis following cerebral infarction affecting right dominant side: Secondary | ICD-10-CM | POA: Diagnosis not present

## 2021-03-07 NOTE — Therapy (Signed)
[facility] [address] [place], [place], [address] Phone: [phone]   Fax:  [phone]  Speech Language Pathology Treatment  Patient Details  Name: Brittany Griffith MRN: [id number] Date of Birth: [birth year] Referring Provider (SLP): Cathlyn Parsons, PA-C   Encounter Date: 03/07/2021   End of Session - 03/07/21 1750     Visit Number 19    Number of Visits 33    Date for SLP Re-Evaluation 04/07/21    Authorization Type Aetna Medicare    SLP Start Time 1232    SLP Stop Time  1315    SLP Time Calculation (min) 43 min    Activity Tolerance Patient tolerated treatment well             Past Medical History:  Diagnosis Date   Hypercholesteremia    Hypertension    Osteopenia    Stroke [facility])     Past Surgical History:  Procedure Laterality Date   IR ANGIO INTRA EXTRACRAN SEL COM CAROTID INNOMINATE UNI R MOD SED  10/26/2020   IR INTRA CRAN STENT  10/26/2020   IR RADIOLOGIST EVAL & MGMT  12/09/2020   RADIOLOGY WITH ANESTHESIA Left 10/26/2020   Procedure: RADIOLOGY WITH ANESTHESIA LEFT MCA STENT;  Surgeon: Luanne Bras, MD;  Location: [facility];  Service: Radiology;  Laterality: Left;    There were no vitals filed for this visit.   Subjective Assessment - 03/07/21 1720     Subjective Nodded and smiled    Patient is accompained by: Family member    Currently in Pain? No/denies                   ADULT SLP TREATMENT - 03/07/21 0001       General Information   Behavior/Cognition Alert;Pleasant mood;Requires cueing      Treatment Provided   Treatment provided Cognitive-Linquistic      Cognitive-Linquistic Treatment   Treatment focused on Apraxia;Aphasia    Skilled Treatment Pt returned after speech therapy hold while awaiting personal Lingraphica device. Personal device brought to ST session today. Pt's husband indicates some inconsistent use at home. SLP provided re-education of importance  of daily and consistent use to maximize communication effectiveness and pt comprehension of device. Pt's husband verbalized agreement and understanding. Device currently set up with only 1 page visible requring scrolling to access other category pages. Usual prompting required to scroll; therefore, SLP provided modifications to trial 3 icons per row for 6 icons total on screen. Further education and training completed for pt to access categories and icons within categories. Overall good comprehension and ID of icons exhibited within "food" category when SLP assessed breakfast, lunch, and dinner pages. Further training may be warranted for "drinks" and "snacks" with SLP requesting pt's husband to modify categories at home to demonstrate competency. Pt exhibited inconsistent comprehension for "about me" page. Training provided to select name/own picture when meeting new person. SLP reviewed how device should be utilized at doctor appointments, etc so Brittany Griffith may provide some of her own personal information if possible. SLP introduced drawing/typing components of Lingraphica to facilitate further use. Pt able to copy simple written word (name) and numbers (birthdate and age). Spontaneous writing of name exhibited x1, with otherwise usual perseveration noted while writing. Pt unable to draw simple figures when verbally prompted. SLP assessed comprehension to ID colors given verbal prompt and mutiple options. Pt able to ID correct color with 40% accuracy (2/5 trials).  Assessment / Recommendations / Plan   Plan Continue with current plan of care      Progression Toward Goals   Progression toward goals Progressing toward goals              SLP Education - 03/07/21 1750     Education Details various uses for Lingraphica device, need for consistent use to maximize communication and comprehension of device    Person(s) Educated Patient;Spouse    Methods Explanation;Demonstration    Comprehension  Verbalized understanding;Returned demonstration;Need further instruction              SLP Short Term Goals - 03/07/21 1756       SLP SHORT TERM GOAL #1   Title Pt will use mulitmodal communication (gesture, draw, write 1st letter etc) to augment verbal expression of basic wants/needs with usual mod A for 3 sessions    Baseline 12-30-20    Period --   or 9 visits for all STGs   Status Partially Met      SLP [place] #2   Title Pt's caregivers will appropriately cue patient and use alternative communication means when needed with occasional min A from SLP over 2 sessions    Status Not Met      SLP SHORT TERM GOAL #3   Title Pt will approximate one-word personally relevant responses to supplement alternatives means of communication with usual mod A over 2 sessions    Status Not Met      SLP SHORT TERM GOAL #4   Title Pt will ID object in field of 4 to communicate wants/needs or demonstrate understanding with 75% accuracy given occasional min A over 3 sessions    Baseline 12-30-20 (family)    Status Partially Met      SLP SHORT TERM GOAL #5   Title Pt will tolerate trials of dysphagia 3/mechanical soft consistency with no overt s/sx of aspiration given occasional min A over 3 sessions    Status Unable to assess      SLP SHORT TERM GOAL #6   Title pt will select correct icon for differentiating family members in 3 sessions    Baseline 03-07-21    Time 3    Period Weeks    Status On-going      SLP SHORT TERM GOAL #7   Title pt will select correct icon for food choices in 3 sessions    Baseline 03-07-21    Time 3    Period Weeks    Status On-going      SLP SHORT TERM GOAL #8   Title pt family will demonstrate knowledge of basic programming for speech generating device to maximize pt use of device with occasional min A in 2 sessions    Time 3    Period Weeks    Status On-going              SLP Long Term Goals - 03/07/21 1754       SLP LONG TERM GOAL #1    Title Pt will use mulitmodal communication (gesture, draw, write 1st letter etc) to augment verbal expression to meet needs at home with occasional mod A from family.    Period --   or 17 visits for all LTGs   Status Achieved   (pointing)     SLP LONG TERM GOAL #2   Title Pt will correctly ID item in field of 6 to communicate wants/needs with 75% accuracy given occasional mod A over 2  sessions    Baseline 02-07-21    Status Partially Met      SLP LONG TERM GOAL #3   Title Pt will approximate 3 personally relevant one word responses to supplement alternatives means of communication with usual mod A over 2 sessions    Baseline 01-24-21    Status Not Met      SLP LONG TERM GOAL #4   Title Caregivers will report improvements in communication effectiveness via QOL scale by last ST session    Status Deferred      SLP LONG TERM GOAL #5   Title Pt will tolerate safest and least restricitive diet with no overt s/sx of aspiration reported or exhibited over 3 sessions    Status Unable to assess      SLP LONG TERM GOAL #6   Title pt family will demonstrate knowledge of basic programming for speech generating device to maximize pt use of device in 3 sessions    Time 7    Period Weeks    Status On-going      SLP LONG TERM GOAL #7   Title pt will demonstrate navigation to correct page to communicate ideas of interest (garden, family, birds, etc) in 3 sessions    Time 7    Period Weeks    Status On-going      SLP LONG TERM GOAL #8   Title pt will demonstrate navigation to correct page to communicate medical messages with rare min A in 3 sessions    Time 7    Period Weeks    Status On-going              Plan - 03/07/21 1751     Clinical Impression Statement 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 She also has (according to family) resolving mild oropharyngeal dysphagia. These deficts are due to cerebral infarction of left  middle cerebral artery in March 2022. SLP provided ongoing education and assessment of Lingraphica device, which is recommended for pt to assist/augment her verbal communication. 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 SLP continues to model basic usage of Lingraphica including how to increase comprehension and accuracy of selecting icons to request wanted/needed items and communicate biographical information. See "skilled intervention" for more details. 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 Swallow function will cont to be monitored as well.    Speech Therapy Frequency 2x / week    Duration 8 weeks    Treatment/Interventions Aspiration precaution training;Trials of upgraded texture/liquids;Oral motor exercises;Compensatory strategies;Pharyngeal strengthening exercises;Cueing hierarchy;Functional tasks;Patient/family education;Diet toleration management by SLP;Environmental controls;Cognitive reorganization;Multimodal communcation approach;Language facilitation;Compensatory techniques;Internal/external aids;SLP instruction and feedback    Potential to Achieve Goals Fair    Potential Considerations Severity of impairments;Co-morbidities;Ability to learn/carryover information    SLP Home Exercise Plan provided    Consulted and Agree with Plan of Care Patient;Family member/caregiver    Family Member Consulted husband             Patient will benefit from skilled therapeutic intervention in order to improve the following deficits and impairments:   Verbal apraxia  Aphasia    Problem List Patient Active Problem List   Diagnosis Date Noted   Right hemiplegia (  [place])     Urinary retention    Hyperglycemia    Essential hypertension    Dysphagia, post-stroke    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 [place], [place], [address] Phone: [phone]   Fax:  [phone]   Name: Brittany Griffith MRN: [id number] Date of Birth: 08/25/1950

## 2021-03-10 ENCOUNTER — Ambulatory Visit: Payer: Medicare HMO

## 2021-03-10 ENCOUNTER — Other Ambulatory Visit: Payer: Self-pay

## 2021-03-10 DIAGNOSIS — I69351 Hemiplegia and hemiparesis following cerebral infarction affecting right dominant side: Secondary | ICD-10-CM | POA: Diagnosis not present

## 2021-03-10 DIAGNOSIS — R4701 Aphasia: Secondary | ICD-10-CM

## 2021-03-10 DIAGNOSIS — R482 Apraxia: Secondary | ICD-10-CM

## 2021-03-10 DIAGNOSIS — R41841 Cognitive communication deficit: Secondary | ICD-10-CM

## 2021-03-10 NOTE — Therapy (Signed)
[facility] [address] [place], [place], [address] Phone: [phone]   Fax:  [phone]  Speech Language Pathology Treatment/Medicare progress note  Patient Details  Name: Brittany Griffith MRN: [id number] Date of Birth: Oct 10, 1950 Referring Provider (SLP): Cathlyn Parsons, PA-C   Encounter Date: 03/10/2021   End of Session - 03/10/21 1657     Visit Number 20    Number of Visits 33    Date for SLP Re-Evaluation 04/07/21    Authorization Type [employer]    SLP Start Time 1319    SLP Stop Time  1400    SLP Time Calculation (min) 41 min    Activity Tolerance Patient tolerated treatment well             Past Medical History:  Diagnosis Date   Hypercholesteremia    Hypertension    Osteopenia    Stroke [facility])     Past Surgical History:  Procedure Laterality Date   IR ANGIO INTRA EXTRACRAN SEL COM CAROTID INNOMINATE UNI R MOD SED  10/26/2020   IR INTRA CRAN STENT  10/26/2020   IR RADIOLOGIST EVAL & MGMT  12/09/2020   RADIOLOGY WITH ANESTHESIA Left 10/26/2020   Procedure: RADIOLOGY WITH ANESTHESIA LEFT MCA STENT;  Surgeon: Luanne Bras, MD;  Location: [place];  Service: Radiology;  Laterality: Left;    There were no vitals filed for this visit.    Speech Therapy Progress Note  Dates of Reporting Period: 02-02-21 to present  Subjective Statement: Pt has been seen for 20 sessions ST for mainly training with use of AAC device/Lingraphica.  Objective: See below  Goal Update: See below. SLP does not think [name] has enough exposure with Lingraphica device at home based on pt's performance using the device in session.  Plan: SEe pt for approx 2 more weeks- if participation in using device does not improve pt will be discharged.  Reason Skilled Services are Required: cont to train pt and husband how to use Lingraphica.     Subjective Assessment - 03/10/21 1647     Subjective SLP had to cue pt to  turn on device after [name] got device out and set it on table.    Currently in Pain? No/denies                   ADULT SLP TREATMENT - 03/10/21 1649       General Information   Behavior/Cognition Alert;Pleasant mood;Requires cueing      Treatment Provided   Treatment provided Cognitive-Linquistic      Cognitive-Linquistic Treatment   Treatment focused on Apraxia;Aphasia    Skilled Treatment Pt responded to SLP queistion correctly "Who lives with you now" after one repeat of question. SLP showed [name] and Tomorrow other ways to use Lingraphica with the draw/write function and had [name] pick choices between 2-3 written choices - with consistent extra time and occasional repeat of choices she responded 90% of the time to pick her choice. SLP told [name] and [name] to have [employer] around in [place] and not necessarily always have to use it to communicate - that perusing will encourge her to use it becaus eshe will be more familiar with the device itself.      Assessment / Recommendations / Plan   Plan Continue with current plan of care      Progression Toward Goals   Progression toward goals Not progressing toward goals (comment)   do not sense pt using  device at home             SLP Education - 03/10/21 1656     Education Details draw/write function good for choices, needs to familiarize herself with the device    Person(s) Educated Patient;Spouse    Methods Explanation    Comprehension Verbalized understanding              SLP Short Term Goals - 03/07/21 1756       SLP SHORT TERM GOAL #1   Title Pt will use mulitmodal communication (gesture, draw, write 1st letter etc) to augment verbal expression of basic wants/needs with usual mod A for 3 sessions    Baseline 12-30-20    Period --   or 9 visits for all STGs   Status Partially Met      SLP [place] #2   Title Pt's caregivers will appropriately cue patient and use alternative communication means  when needed with occasional min A from SLP over 2 sessions    Status Not Met      SLP SHORT TERM GOAL #3   Title Pt will approximate one-word personally relevant responses to supplement alternatives means of communication with usual mod A over 2 sessions    Status Not Met      SLP SHORT TERM GOAL #4   Title Pt will ID object in field of 4 to communicate wants/needs or demonstrate understanding with 75% accuracy given occasional min A over 3 sessions    Baseline 12-30-20 (family)    Status Partially Met      SLP SHORT TERM GOAL #5   Title Pt will tolerate trials of dysphagia 3/mechanical soft consistency with no overt s/sx of aspiration given occasional min A over 3 sessions    Status Unable to assess      SLP SHORT TERM GOAL #6   Title pt will select correct icon for differentiating family members in 3 sessions    Baseline 03-07-21    Time 3    Period Weeks    Status On-going      SLP SHORT TERM GOAL #7   Title pt will select correct icon for food choices in 3 sessions    Baseline 03-07-21    Time 3    Period Weeks    Status On-going      SLP SHORT TERM GOAL #8   Title pt family will demonstrate knowledge of basic programming for speech generating device to maximize pt use of device with occasional min A in 2 sessions    Time 3    Period Weeks    Status On-going              SLP Long Term Goals - 03/10/21 1658       SLP LONG TERM GOAL #1   Title Pt will use mulitmodal communication (gesture, draw, write 1st letter etc) to augment verbal expression to meet needs at home with occasional mod A from family.    Period --   or 17 visits for all LTGs   Status Achieved   (pointing)     SLP LONG TERM GOAL #2   Title Pt will correctly ID item in field of 6 to communicate wants/needs with 75% accuracy given occasional mod A over 2 sessions    Baseline 02-07-21    Status Partially Met      SLP LONG TERM GOAL #3   Title Pt will approximate 3 personally relevant one word  responses to  supplement alternatives means of communication with usual mod A over 2 sessions    Baseline 01-24-21    Status Not Met      SLP LONG TERM GOAL #4   Title Caregivers will report improvements in communication effectiveness via QOL scale by last ST session    Status Deferred      SLP LONG TERM GOAL #5   Title Pt will tolerate safest and least restricitive diet with no overt s/sx of aspiration reported or exhibited over 3 sessions    Status Unable to assess      SLP LONG TERM GOAL #6   Title pt family will demonstrate knowledge of basic programming for speech generating device to maximize pt use of device in 3 sessions    Time 7    Period Weeks    Status On-going      SLP LONG TERM GOAL #7   Title pt will demonstrate navigation to correct page to communicate ideas of interest (garden, family, birds, etc) in 3 sessions    Time 7    Period Weeks    Status On-going      SLP LONG TERM GOAL #8   Title pt will demonstrate navigation to correct page to communicate medical messages with rare min A in 3 sessions    Time 7    Period Weeks    Status On-going              Plan - 03/10/21 1657     Clinical Impression Statement 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 She also has (according to family) resolving mild oropharyngeal dysphagia. 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 SLP provided ongoing education and assessment of Lingraphica device, which is recommended for pt to assist/augment her verbal communication. 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 SLP continues to model basic usage of Lingraphica including how to increase comprehension and accuracy of selecting icons to request wanted/needed items and communicate biographical information. See "skilled intervention"  for more details. 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 Swallow function will cont to be monitored as well.    Speech Therapy Frequency 2x / week    Duration 8 weeks    Treatment/Interventions Aspiration precaution training;Trials of upgraded texture/liquids;Oral motor exercises;Compensatory strategies;Pharyngeal strengthening exercises;Cueing hierarchy;Functional tasks;Patient/family education;Diet toleration management by SLP;Environmental controls;Cognitive reorganization;Multimodal communcation approach;Language facilitation;Compensatory techniques;Internal/external aids;SLP instruction and feedback    Potential to Achieve Goals Fair    Potential Considerations Severity of impairments;Co-morbidities;Ability to learn/carryover information    SLP Home Exercise Plan provided    Consulted and Agree with Plan of Care Patient;Family member/caregiver    Family Member Consulted husband             Patient will benefit from skilled therapeutic intervention in order to improve the following deficits and impairments:   Verbal apraxia  Aphasia  Cognitive communication deficit    Problem List Patient Active Problem List   Diagnosis Date Noted   Right hemiplegia ([place])    Urinary retention    Hyperglycemia    Essential hypertension    Dysphagia, post-stroke    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 ,[place], CCC-SLP  03/10/2021, 4:59 PM  [facility] [address] [place], [place], [address] Phone: [phone]   Fax:  [phone]   Name: Aymara Sassi MRN: [id number] Date of Birth: 1951-02-24

## 2021-03-14 ENCOUNTER — Ambulatory Visit: Payer: Medicare HMO

## 2021-03-15 ENCOUNTER — Ambulatory Visit: Payer: Medicare HMO

## 2021-03-15 ENCOUNTER — Ambulatory Visit: Payer: Medicare HMO | Attending: Physician Assistant

## 2021-03-15 ENCOUNTER — Other Ambulatory Visit: Payer: Self-pay

## 2021-03-15 DIAGNOSIS — R4701 Aphasia: Secondary | ICD-10-CM | POA: Diagnosis present

## 2021-03-15 DIAGNOSIS — R41841 Cognitive communication deficit: Secondary | ICD-10-CM | POA: Insufficient documentation

## 2021-03-15 DIAGNOSIS — R1312 Dysphagia, oropharyngeal phase: Secondary | ICD-10-CM | POA: Insufficient documentation

## 2021-03-15 DIAGNOSIS — R482 Apraxia: Secondary | ICD-10-CM | POA: Insufficient documentation

## 2021-03-15 NOTE — Therapy (Signed)
[facility] [address] [place], [place], [address] Phone: [phone]   Fax:  [phone]  Speech Language Pathology Treatment  Patient Details  Name: Brittany Griffith MRN: [id number] Date of Birth: September 05, 1950 Referring Provider (SLP): Cathlyn Parsons, PA-C   Encounter Date: 03/15/2021   End of Session - 03/15/21 1708     Visit Number 21    Number of Visits 33    Date for SLP Re-Evaluation 04/07/21    Authorization Type [employer]na Medicare    SLP Start Time 1236    SLP Stop Time  1315    SLP Time Calculation (min) 39 min    Activity Tolerance Patient tolerated treatment well             Past Medical History:  Diagnosis Date   Hypercholesteremia    Hypertension    Osteopenia    Stroke [facility])     Past Surgical History:  Procedure Laterality Date   IR ANGIO INTRA EXTRACRAN SEL COM CAROTID INNOMINATE UNI R MOD SED  10/26/2020   IR INTRA CRAN STENT  10/26/2020   IR RADIOLOGIST EVAL & MGMT  12/09/2020   RADIOLOGY WITH ANESTHESIA Left 10/26/2020   Procedure: RADIOLOGY WITH ANESTHESIA LEFT MCA STENT;  Surgeon: Luanne Bras, MD;  Location: [facility];  Service: Radiology;  Laterality: Left;    There were no vitals filed for this visit.   Subjective Assessment - 03/15/21 1656     Subjective Pt arrives in sling. [name] stated pt's upper part of rt arm is tight. SLP told pt/husband if worse, call MD.    Patient is accompained by: Family member   [place] - husband   Currently in Pain? Yes    Pain Score 8     Pain Location Arm    Pain Orientation Right    Pain Descriptors / Indicators Aching;Tightness                   ADULT SLP TREATMENT - 03/15/21 1240       General Information   Behavior/Cognition Alert;Pleasant mood;Requires cueing      Treatment Provided   Treatment provided Cognitive-Linquistic      Cognitive-Linquistic Treatment   Treatment focused on Apraxia;Aphasia    Skilled Treatment  [name] cont to deny pt has notable overt s/sx aspiration with meals. SLP questions pt's knowledge of device/ familiarity with device as she did not know how to navigate to grocery store, post office, or to pain scale and req'd max cues to do so. Pt and husband have been told the necessity of allowing pt to noodle around on the device to familiarize herself with where things are, many times by SLPs since receiving the device. At this time the last two sessions have almost been identical in nature - consistent extra time necessary with cues for navigation and today Brittany Griffith even req'd assistance to encourage her to access a response, unlike last session where she responded 90% of the time. SLP will reduce pt's frequency to once/week as SLP believes pt is receiving suboptimal support at home in order to know how to use her device. [name] asked at end of session, "When will she talk again?" SLP reiterated again to husband that SLPs believe Brittany Griffith will NOT BE a verbal communicator and that the device will be key in having her communicate. SLP encrouaged family to cont to work with patient at home if they so desire.      Assessment / Recommendations /  Plan   Plan Continue with current plan of care              SLP Education - 03/15/21 1707     Education Details pt needs to familiarize herslef with the device - needs some guided time with someone else >age 69 to assist in familiarization of icons    Person(s) Educated Patient;Spouse    Methods Explanation;Handout    Comprehension Verbalized understanding;Returned demonstration;Need further instruction              SLP Short Term Goals - 03/07/21 1756       SLP SHORT TERM GOAL #1   Title Pt will use mulitmodal communication (gesture, draw, write 1st letter etc) to augment verbal expression of basic wants/needs with usual mod A for 3 sessions    Baseline 12-30-20    Period --   or 9 visits for all STGs   Status Partially Met      SLP [place]  #2   Title Pt's caregivers will appropriately cue patient and use alternative communication means when needed with occasional min A from SLP over 2 sessions    Status Not Met      SLP SHORT TERM GOAL #3   Title Pt will approximate one-word personally relevant responses to supplement alternatives means of communication with usual mod A over 2 sessions    Status Not Met      SLP SHORT TERM GOAL #4   Title Pt will ID object in field of 4 to communicate wants/needs or demonstrate understanding with 75% accuracy given occasional min A over 3 sessions    Baseline 12-30-20 (family)    Status Partially Met      SLP SHORT TERM GOAL #5   Title Pt will tolerate trials of dysphagia 3/mechanical soft consistency with no overt s/sx of aspiration given occasional min A over 3 sessions    Status Unable to assess      SLP SHORT TERM GOAL #6   Title pt will select correct icon for differentiating family members in 3 sessions    Baseline 03-07-21    Time 3    Period Weeks    Status On-going      SLP SHORT TERM GOAL #7   Title pt will select correct icon for food choices in 3 sessions    Baseline 03-07-21    Time 3    Period Weeks    Status On-going      SLP SHORT TERM GOAL #8   Title pt family will demonstrate knowledge of basic programming for speech generating device to maximize pt use of device with occasional min A in 2 sessions    Time 3    Period Weeks    Status On-going              SLP Long Term Goals - 03/15/21 1710       SLP LONG TERM GOAL #1   Title Pt will use mulitmodal communication (gesture, draw, write 1st letter etc) to augment verbal expression to meet needs at home with occasional mod A from family.    Period --   or 17 visits for all LTGs   Status Achieved   (pointing)     SLP LONG TERM GOAL #2   Title Pt will correctly ID item in field of 6 to communicate wants/needs with 75% accuracy given occasional mod A over 2 sessions    Baseline 02-07-21    Status Partially  Met  SLP LONG TERM GOAL #3   Title Pt will approximate 3 personally relevant one word responses to supplement alternatives means of communication with usual mod A over 2 sessions    Baseline 01-24-21    Status Not Met      SLP LONG TERM GOAL #4   Title Caregivers will report improvements in communication effectiveness via QOL scale by last ST session    Status Deferred      SLP LONG TERM GOAL #5   Title Pt will tolerate safest and least restricitive diet with no overt s/sx of aspiration reported or exhibited over 3 sessions    Status Unable to assess      SLP LONG TERM GOAL #6   Title pt family will demonstrate knowledge of basic programming for speech generating device to maximize pt use of device in 3 sessions    Baseline 03-15-21    Time 7    Period Weeks    Status On-going      SLP LONG TERM GOAL #7   Title pt will demonstrate navigation to correct page to communicate ideas of interest (garden, family, birds, etc) in 3 sessions    Time 7    Period Weeks    Status On-going      SLP LONG TERM GOAL #8   Title pt will demonstrate navigation to correct page to communicate medical messages with rare min A in 3 sessions    Time 7    Period Weeks    Status On-going              Plan - 03/15/21 1709     Clinical Impression Statement 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 She also has (according to family) resolving mild oropharyngeal dysphagia. 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 SLP provided ongoing education and assessment of Lingraphica device, which is recommended for pt to assist/augment her verbal communication. Today husband asked SLP when pt would talk again. 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 SLP continues to model basic usage of Lingraphica including how to increase comprehension and accuracy of selecting icons to request wanted/needed items and communicate biographical information. See "skilled intervention" for more details.Because pt is with limited family assistance despite SLPs insistence upon this aspect of pt's home care, SLP recommends continued skilled ST intervention at once a week for two more weeks to, as much as possible, improve efficiency and accuracy of communication of basic wants/needs by augmenting communication with a speech generating device.    Speech Therapy Frequency 1x /week    Duration 8 weeks    Treatment/Interventions Aspiration precaution training;Trials of upgraded texture/liquids;Oral motor exercises;Compensatory strategies;Pharyngeal strengthening exercises;Cueing hierarchy;Functional tasks;Patient/family education;Diet toleration management by SLP;Environmental controls;Cognitive reorganization;Multimodal communcation approach;Language facilitation;Compensatory techniques;Internal/external aids;SLP instruction and feedback    Potential to Achieve Goals Fair    Potential Considerations Severity of impairments;Co-morbidities;Ability to learn/carryover information    SLP Home Exercise Plan provided    Consulted and Agree with Plan of Care Patient;Family member/caregiver    Family Member Consulted husband             Patient will benefit from skilled therapeutic intervention in order to improve the following deficits and impairments:   Verbal apraxia  Aphasia  Cognitive communication deficit  Problem List Patient Active Problem List   Diagnosis Date Noted   Right hemiplegia [facility])    Urinary retention    Hyperglycemia    Essential hypertension    Dysphagia, post-stroke    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[place]) 11/01/2020   Left middle cerebral artery stroke ([place]) 11/01/2020   Dysphagia 11/01/2020    Middle cerebral artery stenosis, left 10/26/2020   Acute CVA (cerebrovascular accident) ([place]) 10/24/2020   left MCA stroke 10/23/2020   Meningioma ([place]) 10/23/2020   Cerebral edema ([place]) 10/23/2020   Global aphasia 10/23/2020   Hypertensive urgency 10/23/2020    [facility]  ,[place], [place]  03/15/2021, 5:13 PM  [facility] [address] [place], [place], [address] Phone: [phone]   Fax:  [phone]   Name: Loetta Connelley MRN: [id number] Date of Birth: 05-23-1951

## 2021-03-17 ENCOUNTER — Telehealth: Payer: Self-pay

## 2021-03-17 NOTE — Telephone Encounter (Signed)
Brilinta denied.

## 2021-03-20 ENCOUNTER — Ambulatory Visit: Payer: Medicare HMO | Admitting: Neurology

## 2021-03-23 ENCOUNTER — Ambulatory Visit: Payer: Medicare HMO | Admitting: Neurology

## 2021-03-23 ENCOUNTER — Other Ambulatory Visit: Payer: Self-pay | Admitting: Chiropractic Medicine

## 2021-03-23 ENCOUNTER — Ambulatory Visit
Admission: RE | Admit: 2021-03-23 | Discharge: 2021-03-23 | Disposition: A | Discharge status: Home / Self Care | Payer: Medicare HMO | Source: Ambulatory Visit | Attending: Chiropractic Medicine | Admitting: Chiropractic Medicine

## 2021-03-23 DIAGNOSIS — R52 Pain, unspecified: Secondary | ICD-10-CM

## 2021-03-24 ENCOUNTER — Ambulatory Visit: Payer: Medicare HMO

## 2021-03-24 ENCOUNTER — Other Ambulatory Visit: Payer: Self-pay

## 2021-03-24 DIAGNOSIS — R482 Apraxia: Secondary | ICD-10-CM | POA: Diagnosis not present

## 2021-03-24 DIAGNOSIS — R4701 Aphasia: Secondary | ICD-10-CM

## 2021-03-24 NOTE — Therapy (Signed)
[facility] [address] [place], [place], [address] Phone: [phone]   Fax:  [phone]  Speech Language Pathology Treatment  Patient Details  Name: Brittany Griffith MRN: [id number] Date of Birth: Jul 10, 1951 Referring Provider (SLP): Cathlyn Parsons, PA-C   Encounter Date: 03/24/2021   End of Session - 03/24/21 1216     Visit Number 22    Number of Visits 33    Date for SLP Re-Evaluation 04/07/21    Authorization Type [employer]    SLP Start Time 1101    SLP Stop Time  1145    SLP Time Calculation (min) 44 min    Activity Tolerance Patient tolerated treatment well             Past Medical History:  Diagnosis Date   Hypercholesteremia    Hypertension    Osteopenia    Stroke [facility] [place] [facility])     Past Surgical History:  Procedure Laterality Date   IR ANGIO INTRA EXTRACRAN SEL COM CAROTID INNOMINATE UNI R MOD SED  10/26/2020   IR INTRA CRAN STENT  10/26/2020   IR RADIOLOGIST EVAL & MGMT  12/09/2020   RADIOLOGY WITH ANESTHESIA Left 10/26/2020   Procedure: RADIOLOGY WITH ANESTHESIA LEFT MCA STENT;  Surgeon: Luanne Bras, MD;  Location: [facility];  Service: Radiology;  Laterality: Left;    There were no vitals filed for this visit.   Subjective Assessment - 03/24/21 1103     Subjective "some" re: use of Lingraphica    Currently in Pain? Yes    Pain Score 8     Pain Location Arm    Pain Orientation Right                   ADULT SLP TREATMENT - 03/24/21 1158       General Information   Behavior/Cognition Alert;Pleasant mood;Requires cueing      Treatment Provided   Treatment provided Cognitive-Linquistic      Cognitive-Linquistic Treatment   Treatment focused on Apraxia;Aphasia    Skilled Treatment Pt's husband reported "some" use of Lingraphica device at home. He endorsed she exhibits some confusion re: operating device. With further questioning, husband reports focusing on "therapy  activities" to verbalize phonemes versus utilizing device as communication aid. SLP again educated and emphasized importance of daily familiarization and training with the device at home to optimize patient's understanding and usage of device to communicate. Pt's husband verbalized understanding. SLP demonstrated how to use device when out in community at appointments and out to eat, including having patient introduce herself, identify her medical information at appointments, and requesting needs when dining out. Usual prompting required this session for pt to initiate tapping icons. Inconsistent cues required this session to ID icons, with varying min to max verbal and visual cues required to ID icons for biographical information, foods, wants/needs. SLP educated pt's husband and interpreter on appropriate cues and avoiding providing too much information or over-assisting before she has re-attempted. SLP assessed verbal output given max SLP modeling, device modeling, and interpreter modeling in native language. Usual unintellgibile and/or apraxic errors noted. Two CV words verbalized across entirety of session given max verbal and visual cues. SLP continued to emphasize and stress to husband the importance of using communication device due to severity of oral apraxia and aphasia impacting her ability to verbally communicate. Pt's husband verbalized understanding; however, SLP questions understanding of education due to comments from previous sessions.      Assessment /  Recommendations / Plan   Plan Continue with current plan of care      Progression Toward Goals   Progression toward goals Not progressing toward goals (comment)   limited use of communication device as recommended             SLP Education - 03/24/21 1215     Education Details DAILY familiarization and training of device at home, various opportunities to engage patient with device    Person(s) Educated Patient;Spouse    Methods  Explanation;Demonstration    Comprehension Verbalized understanding              SLP Short Term Goals - 03/24/21 1218       SLP SHORT TERM GOAL #1   Title Pt will use mulitmodal communication (gesture, draw, write 1st letter etc) to augment verbal expression of basic wants/needs with usual mod A for 3 sessions    Baseline 12-30-20    Period --   or 9 visits for all STGs   Status Partially Met      SLP [place] #2   Title Pt's caregivers will appropriately cue patient and use alternative communication means when needed with occasional min A from SLP over 2 sessions    Status Not Met      SLP SHORT TERM GOAL #3   Title Pt will approximate one-word personally relevant responses to supplement alternatives means of communication with usual mod A over 2 sessions    Status Not Met      SLP SHORT TERM GOAL #4   Title Pt will ID object in field of 4 to communicate wants/needs or demonstrate understanding with 75% accuracy given occasional min A over 3 sessions    Baseline 12-30-20 (family)    Status Partially Met      SLP SHORT TERM GOAL #5   Title Pt will tolerate trials of dysphagia 3/mechanical soft consistency with no overt s/sx of aspiration given occasional min A over 3 sessions    Status Unable to assess      SLP SHORT TERM GOAL #6   Title pt will select correct icon for differentiating family members in 3 sessions    Baseline 03-07-21    Time 2    Period Weeks    Status On-going      SLP SHORT TERM GOAL #7   Title pt will select correct icon for food choices in 3 sessions    Baseline 03-07-21    Time 2    Period Weeks    Status On-going      SLP SHORT TERM GOAL #8   Title pt family will demonstrate knowledge of basic programming for speech generating device to maximize pt use of device with occasional min A in 2 sessions    Time 2    Period Weeks    Status On-going              SLP Long Term Goals - 03/24/21 1218       SLP LONG TERM GOAL #1   Title  Pt will use mulitmodal communication (gesture, draw, write 1st letter etc) to augment verbal expression to meet needs at home with occasional mod A from family.    Period --   or 17 visits for all LTGs   Status Achieved   (pointing)     SLP LONG TERM GOAL #2   Title Pt will correctly ID item in field of 6 to communicate wants/needs with 75% accuracy given occasional  mod A over 2 sessions    Baseline 02-07-21    Status Partially Met      SLP LONG TERM GOAL #3   Title Pt will approximate 3 personally relevant one word responses to supplement alternatives means of communication with usual mod A over 2 sessions    Baseline 01-24-21    Status Not Met      SLP LONG TERM GOAL #4   Title Caregivers will report improvements in communication effectiveness via QOL scale by last ST session    Status Deferred      SLP LONG TERM GOAL #5   Title Pt will tolerate safest and least restricitive diet with no overt s/sx of aspiration reported or exhibited over 3 sessions    Status Unable to assess      SLP LONG TERM GOAL #6   Title pt family will demonstrate knowledge of basic programming for speech generating device to maximize pt use of device in 3 sessions    Baseline 03-15-21    Time 6    Period Weeks    Status On-going      SLP LONG TERM GOAL #7   Title pt will demonstrate navigation to correct page to communicate ideas of interest (garden, family, birds, etc) in 3 sessions    Baseline 03-24-21    Time 6    Period Weeks    Status On-going      SLP LONG TERM GOAL #8   Title pt will demonstrate navigation to correct page to communicate medical messages with rare min A in 3 sessions    Time 6    Period Weeks    Status On-going              Plan - 03/24/21 1216     Clinical Impression Statement 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 She also has (according to family) resolving mild oropharyngeal dysphagia. 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 SLP provided ongoing education and assessment of Lingraphica device, which is recommended for pt to assist/augment her verbal communication. 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 SLP continues to model basic usage of Lingraphica including how to increase comprehension and accuracy of selecting icons to request wanted/needed items and communicate biographical information. See "skilled intervention" for more details. Because pt is with limited family assistance despite SLPs insistence upon this aspect of pt's home care, SLP recommends continued skilled ST intervention at once a week for one more week to, as much as possible, improve efficiency and accuracy of communication of basic wants/needs by augmenting communication with a speech generating device.    Speech Therapy Frequency 1x /week    Duration 8 weeks    Treatment/Interventions Aspiration precaution training;Trials of upgraded texture/liquids;Oral motor exercises;Compensatory strategies;Pharyngeal strengthening exercises;Cueing hierarchy;Functional tasks;Patient/family education;Diet toleration management by SLP;Environmental controls;Cognitive reorganization;Multimodal communcation approach;Language facilitation;Compensatory techniques;Internal/external aids;SLP instruction and feedback    Potential to Achieve Goals Fair    Potential Considerations Severity of impairments;Co-morbidities;Ability to learn/carryover information;Cooperation/participation level;Family/community support    SLP Home Exercise Plan provided    Consulted and Agree with Plan of Care Patient;Family member/caregiver    Family Member Consulted husband             Patient will benefit from skilled  therapeutic intervention in order to  improve the following deficits and impairments:   Verbal apraxia  Aphasia    Problem List Patient Active Problem List   Diagnosis Date Noted   Right hemiplegia [facility])    Urinary retention    Hyperglycemia    Essential hypertension    Dysphagia, post-stroke    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

## 2021-03-30 ENCOUNTER — Other Ambulatory Visit: Payer: Self-pay

## 2021-03-30 ENCOUNTER — Ambulatory Visit: Payer: Medicare HMO

## 2021-03-30 DIAGNOSIS — R1312 Dysphagia, oropharyngeal phase: Secondary | ICD-10-CM

## 2021-03-30 DIAGNOSIS — R482 Apraxia: Secondary | ICD-10-CM | POA: Diagnosis not present

## 2021-03-30 DIAGNOSIS — R4701 Aphasia: Secondary | ICD-10-CM

## 2021-03-30 NOTE — Patient Instructions (Signed)
  Neuropsychology - two are at Dr. Letta Pate office:  Dr. Ilean Skill - evaluations and therapy  [facility] [place] [place], [place] [address] [phone]

## 2021-03-30 NOTE — Therapy (Signed)
[facility] [address] [place], [place], [address] Phone: [phone]   Fax:  [phone]  Speech Language Pathology Treatment/Discharge Summary  Patient Details  Name: Shaquana Buel MRN: [id number] Date of Birth: 1950/08/26 Referring Provider (SLP): Cathlyn Parsons, PA-C   Encounter Date: 03/30/2021   End of Session - 03/30/21 1549     Visit Number 23    Number of Visits 33    Date for SLP Re-Evaluation 04/07/21    Authorization Type [employer] Medicare    SLP Start Time 2376    SLP Stop Time  1445    SLP Time Calculation (min) 42 min    Activity Tolerance Patient tolerated treatment well             Past Medical History:  Diagnosis Date   Hypercholesteremia    Hypertension    Osteopenia    Stroke [facility])     Past Surgical History:  Procedure Laterality Date   IR ANGIO INTRA EXTRACRAN SEL COM CAROTID INNOMINATE UNI R MOD SED  10/26/2020   IR INTRA CRAN STENT  10/26/2020   IR RADIOLOGIST EVAL & MGMT  12/09/2020   RADIOLOGY WITH ANESTHESIA Left 10/26/2020   Procedure: RADIOLOGY WITH ANESTHESIA LEFT MCA STENT;  Surgeon: Luanne Bras, MD;  Location: [facility];  Service: Radiology;  Laterality: Left;     SPEECH THERAPY DISCHARGE SUMMARY  Visits from Start of Care: 23  Current functional level related to goals / functional outcomes: Pt's goals were partially met. 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    Remaining deficits: Severe verbal apraxia, severe expressive and likely mod receptive aphasia.   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   Patient agrees to discharge. Patient goals were partially met. Patient is being discharged due to pt has reached max potential at this time.      There were no vitals filed for this visit.          ADULT SLP TREATMENT - 03/30/21 1540       General Information   Behavior/Cognition Alert;Pleasant mood;Requires cueing      Treatment Provided   Treatment provided Cognitive-Linquistic      Cognitive-Linquistic Treatment   Treatment focused on Aphasia;Apraxia    Skilled Treatment Brother, Boyd Kerbs, attended tx on pt's last day. SLP provided situations for pt with occasional min-mod cues (including written cues) for her comprehension of task. Pt req'd occasional min-mod A to navigate to correct page. Chung with concern of pt "down" - SLP ascertained he thought this meant emotionally. SLP suggested neuropsych services if pt desired them and SLP provided information. Boyd Kerbs stated pt did not appear to be motivated to use [place] and SLP informed that SLPs have encouraged pt/husband to explore the device so that pt and husband will be more familiar with it - to encourage pt to use it. Noted pt and husband did not provide any contribution in affirming or denying this suspicion Boyd Kerbs about pt feeling emotionally "down". SLP reiterated that pt was being  discharged today but that did not mean discharged forever - if pt desired more therapy she needed a script and then could be re-evaled by SLP for possible ST, and especially if she appeared more motivated to use the [place].      Assessment / Recommendations / Plan   Plan Continue with current plan of care      Progression Toward Goals   Progression toward goals Not progressing toward goals (comment)   limited device use at home             SLP Education - 03/30/21 1548     Education Details need daily  familiarization with device, pt could be re-evaled with another script at any time in the future, neuropsych information    Person(s) Educated Patient;Spouse;Caregiver(s)    Methods Explanation;Handout    Comprehension Verbalized understanding              SLP Short Term Goals - 03/24/21 1218       SLP SHORT TERM GOAL #1   Title Pt will use mulitmodal communication (gesture, draw, write 1st letter etc) to augment verbal expression of basic wants/needs with usual mod A for 3 sessions    Baseline 12-30-20    Period --   or 9 visits for all STGs   Status Partially Met      SLP [place] #2   Title Pt's caregivers will appropriately cue patient and use alternative communication means when needed with occasional min A from SLP over 2 sessions    Status Not Met      SLP SHORT TERM GOAL #3   Title Pt will approximate one-word personally relevant responses to supplement alternatives means of communication with usual mod A over 2 sessions    Status Not Met      SLP SHORT TERM GOAL #4   Title Pt will ID object in field of 4 to communicate wants/needs or demonstrate understanding with 75% accuracy given occasional min A over 3 sessions    Baseline 12-30-20 (family)    Status Partially Met      SLP SHORT TERM GOAL #5   Title Pt will tolerate trials of dysphagia 3/mechanical soft consistency with no overt s/sx of aspiration given occasional min A over 3 sessions    Status Unable to assess      SLP SHORT TERM GOAL #6   Title pt will select correct icon for differentiating family members in 3 sessions    Baseline 03-07-21    Time 2    Period Weeks    Status On-going      SLP SHORT TERM GOAL #7   Title pt will select correct icon for food choices in 3 sessions    Baseline 03-07-21    Time 2    Period Weeks    Status On-going      SLP SHORT TERM GOAL #8   Title pt family will demonstrate knowledge of basic programming for speech generating device to maximize pt use of device with  occasional min A in 2 sessions    Time 2    Period Weeks    Status On-going              SLP Long Term Goals - 03/30/21 1551       SLP LONG TERM GOAL #1   Title Pt will use mulitmodal communication (gesture, draw, write 1st letter etc) to augment verbal expression to meet needs at home with occasional mod A from  family.    Period --   or 17 visits for all LTGs   Status Achieved   (pointing)     SLP LONG TERM GOAL #2   Title Pt will correctly ID item in field of 6 to communicate wants/needs with 75% accuracy given occasional mod A over 2 sessions    Baseline 02-07-21    Status Partially Met      SLP LONG TERM GOAL #3   Title Pt will approximate 3 personally relevant one word responses to supplement alternatives means of communication with usual mod A over 2 sessions    Baseline 01-24-21    Status Not Met      SLP LONG TERM GOAL #4   Title Caregivers will report improvements in communication effectiveness via QOL scale by last ST session    Status Deferred      SLP LONG TERM GOAL #5   Title Pt will tolerate safest and least restricitive diet with no overt s/sx of aspiration reported or exhibited over 3 sessions    Status Unable to assess      SLP LONG TERM GOAL #6   Title pt family will demonstrate knowledge of basic programming for speech generating device to maximize pt use of device in 3 sessions    Baseline 03-15-21    Status Partially Met      SLP LONG TERM GOAL #7   Title pt will demonstrate navigation to correct page to communicate ideas of interest (garden, family, birds, etc) in 3 sessions    Baseline 03-24-21, 03-30-21    Status Partially Met      SLP LONG TERM GOAL #8   Title pt will demonstrate navigation to correct page to communicate medical messages with rare min A in 3 sessions    Status Not Met              Plan - 03/30/21 1549     Clinical Impression Statement Lili continues to demonstrate severe verbal apraxia, severe expressive aphasia, and mod  receptive aphasia. She also has (according to family) resolving mild oropharyngeal dysphagia. 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 SLP provided ongoing education and assessment of Lingraphica device, which is recommended for pt to See "skilled intervention" for more details. Pt will be d/c'd today and agrees with this plan.    Speech Therapy Frequency 1x /week    Duration 8 weeks    Treatment/Interventions Aspiration precaution training;Trials of upgraded texture/liquids;Oral motor exercises;Compensatory strategies;Pharyngeal strengthening exercises;Cueing hierarchy;Functional tasks;Patient/family education;Diet toleration management by SLP;Environmental controls;Cognitive reorganization;Multimodal communcation approach;Language facilitation;Compensatory techniques;Internal/external aids;SLP instruction and feedback    Potential to Achieve Goals Fair    Potential Considerations Severity of impairments;Co-morbidities;Ability to learn/carryover information;Cooperation/participation level;Family/community support    SLP Home Exercise Plan provided    Consulted and Agree with Plan of Care Patient;Family member/caregiver    Family Member Consulted husband             Patient will benefit from skilled therapeutic intervention in order to improve the following deficits and impairments:   Verbal apraxia  Aphasia  Dysphagia, oropharyngeal phase    Problem List Patient Active Problem List   Diagnosis Date Noted   Right hemiplegia [facility])    Urinary retention    Hyperglycemia    Essential hypertension    Dysphagia, post-stroke    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[place]) 11/01/2020   Left middle cerebral artery stroke ([place]) 11/01/2020   Dysphagia 11/01/2020  Middle cerebral artery stenosis, left 10/26/2020   Acute CVA (cerebrovascular accident) ([place]) 10/24/2020   left MCA stroke 10/23/2020   Meningioma ([place])  10/23/2020   Cerebral edema ([place]) 10/23/2020   Global aphasia 10/23/2020   Hypertensive urgency 10/23/2020    [facility] ,MS, CCC-SLP  03/30/2021, 3:52 PM  [facility] [address] [place], [place], [address] Phone: [phone]   Fax:  [phone]   Name: Jazmon Kos MRN: [id number] Date of Birth: May 03, 1951

## 2021-03-31 ENCOUNTER — Ambulatory Visit: Payer: Medicare HMO

## 2021-04-18 ENCOUNTER — Other Ambulatory Visit: Payer: Self-pay

## 2021-04-18 ENCOUNTER — Ambulatory Visit: Payer: Medicare HMO | Admitting: Neurology

## 2021-04-18 VITALS — BP 152/72 | HR 63 | Ht 61.0 in | Wt 130.0 lb

## 2021-04-18 DIAGNOSIS — I69351 Hemiplegia and hemiparesis following cerebral infarction affecting right dominant side: Secondary | ICD-10-CM | POA: Diagnosis not present

## 2021-04-18 DIAGNOSIS — I69320 Aphasia following cerebral infarction: Secondary | ICD-10-CM

## 2021-04-18 NOTE — Patient Instructions (Signed)
I had a long d/w patient, her brother and  interpreter about his recent stroke, risk for recurrent stroke/TIAs, personally independently reviewed imaging studies and stroke evaluation results and answered questions.Continue aspirin 81 mg daily but stop Brilinta as it has been more than 6 months since the left MCA stent for secondary stroke prevention and maintain strict control of hypertension with blood pressure goal below 130/90, diabetes with hemoglobin A1c goal below 6.5% and lipids with LDL cholesterol goal below 70 mg/dL. I also advised the patient to eat a healthy diet with plenty of whole grains, cereals, fruits and vegetables, exercise regularly and maintain ideal body weight .consider referral to [facility] transcranial magnetic research study for right upper extremity weakness.  Also consider referral to [facility] speech therapy program for more speech therapy if she qualifies.  Followup in the future with me in 6 months or call earlier if necessary. Stroke Prevention Some medical conditions and behaviors are associated with a higher chance of having a stroke. You can help prevent a stroke by making nutrition, lifestyle, and other changes, including managing any medical conditions you may have. What nutrition changes can be made?  Eat healthy foods. You can do this by: Choosing foods high in fiber, such as fresh fruits and vegetables and whole grains. Eating at least 5 or more servings of fruits and vegetables a day. Try to fill half of your plate at each meal with fruits and vegetables. Choosing lean protein foods, such as lean cuts of meat, poultry without skin, fish, tofu, beans, and nuts. Eating low-fat dairy products. Avoiding foods that are high in salt (sodium). This can help lower blood pressure. Avoiding foods that have saturated fat, trans fat, and cholesterol. This can help prevent high cholesterol. Avoiding processed and premade foods. Follow your health care provider's specific guidelines  for losing weight, controlling high blood pressure (hypertension), lowering high cholesterol, and managing diabetes. These may include: Reducing your daily calorie intake. Limiting your daily sodium intake to 1,500 milligrams (mg). Using only healthy fats for cooking, such as olive oil, canola oil, or sunflower oil. Counting your daily carbohydrate intake. What lifestyle changes can be made? Maintain a healthy weight. Talk to your health care provider about your ideal weight. Get at least 30 minutes of moderate physical activity at least 5 days a week. Moderate activity includes brisk walking, biking, and swimming. Do not use any products that contain nicotine or tobacco, such as cigarettes and e-cigarettes. If you need help quitting, ask your health care provider. It may also be helpful to avoid exposure to secondhand smoke. Limit alcohol intake to no more than 1 drink a day for nonpregnant women and 2 drinks a day for men. One drink equals 12 oz of beer, 5 oz of wine, or 1 oz of hard liquor. Stop any illegal drug use. Avoid taking birth control pills. Talk to your health care provider about the risks of taking birth control pills if: You are over 28 years old. You smoke. You get migraines. You have ever had a blood clot. What other changes can be made? Manage your cholesterol levels. Eating a healthy diet is important for preventing high cholesterol. If cholesterol cannot be managed through diet alone, you may also need to take medicines. Take any prescribed medicines to control your cholesterol as told by your health care provider. Manage your diabetes. Eating a healthy diet and exercising regularly are important parts of managing your blood sugar. If your blood sugar cannot be managed through diet  and exercise, you may need to take medicines. Take any prescribed medicines to control your diabetes as told by your health care provider. Control your hypertension. To reduce your risk of  stroke, try to keep your blood pressure below 130/80. Eating a healthy diet and exercising regularly are an important part of controlling your blood pressure. If your blood pressure cannot be managed through diet and exercise, you may need to take medicines. Take any prescribed medicines to control hypertension as told by your health care provider. Ask your health care provider if you should monitor your blood pressure at home. Have your blood pressure checked every year, even if your blood pressure is normal. Blood pressure increases with age and some medical conditions. Get evaluated for sleep disorders (sleep apnea). Talk to your health care provider about getting a sleep evaluation if you snore a lot or have excessive sleepiness. Take over-the-counter and prescription medicines only as told by your health care provider. Aspirin or blood thinners (antiplatelets or anticoagulants) may be recommended to reduce your risk of forming blood clots that can lead to stroke. Make sure that any other medical conditions you have, such as atrial fibrillation or atherosclerosis, are managed. What are the warning signs of a stroke? The warning signs of a stroke can be easily remembered as BEFAST. B is for balance. Signs include: Dizziness. Loss of balance or coordination. Sudden trouble walking. E is for eyes. Signs include: A sudden change in vision. Trouble seeing. F is for face. Signs include: Sudden weakness or numbness of the face. The face or eyelid drooping to one side. A is for arms. Signs include: Sudden weakness or numbness of the arm, usually on one side of the body. S is for speech. Signs include: Trouble speaking (aphasia). Trouble understanding. T is for time. These symptoms may represent a serious problem that is an emergency. Do not wait to see if the symptoms will go away. Get medical help right away. Call your local emergency services (911 in the [place] Do not drive yourself to the  hospital. Other signs of stroke may include: A sudden, severe headache with no known cause. Nausea or vomiting. Seizure. Where to find more information For more information, visit: American Stroke Association: www.strokeassociation.org National Stroke Association: www.stroke.org Summary You can prevent a stroke by eating healthy, exercising, not smoking, limiting alcohol intake, and managing any medical conditions you may have. Do not use any products that contain nicotine or tobacco, such as cigarettes and e-cigarettes. If you need help quitting, ask your health care provider. It may also be helpful to avoid exposure to secondhand smoke. Remember BEFAST for warning signs of stroke. Get help right away if you or a loved one has any of these signs. This information is not intended to replace advice given to you by your health care provider. Make sure you discuss any questions you have with your health care provider. Document Revised: 07/12/2017 Document Reviewed: 09/04/2016 Elsevier Patient Education  2021 [employer].

## 2021-04-18 NOTE — Progress Notes (Signed)
[facility] [address] [place]. [place] [address] [phone]       HOSPITAL FOLLOW UP NOTE  Ms. Brittany Griffith Date of Birth:  15-Aug-1950 Medical Record Number:  [id number]   Reason for Referral:  hospital stroke follow up    SUBJECTIVE:   CHIEF COMPLAINT:  Chief Complaint  Patient presents with   Follow-up    Rm 11, with brother, and interpreter, states she is doing well, would like to discuss how long she should be on keppra, states she has some depression. Would like to set up PT again for right arm     HPI:   Ms. Brittany Griffith is a 70 y.o. female with history of hypertension who presented to [facility] ED on 10/23/2020 after being found sitting on the floor slumped over, nonverbal and unable to follow commands, but awake, tracking with her eyes and crying.  Husband then lifted his wife off the floor and noticed right-sided weakness and a right-sided facial droop.  He called 911 with reported same symptoms on EMS arrival and resolution approximately 20 minutes later.  Personally reviewed hospitalization pertinent progress notes, lab work and imaging with summary provided.  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 Neuro worsening on 3/14 with imaging showing extension of left MCA infarct and repeat CTA 3/15 showing some mild bilateral M1 high-grade stenosis - s/p L M1 stent 3/16. On 3/17, worsening right-sided weakness with CT showing left large MCA infarct. MRI 3/20 large left MCA infarct with MRA questionable stent occlusion.  Recommended continued aspirin and Brilinta for 3 months post stent and follow-up with Dr. Estanislado Pandy 2 weeks after discharge.  Asymptomatic multiple left hemisphere meningiomas with the largest associated with mild edema and minimal rightward shift with neuro surge consult no intervention indicated at that time.  Concern of possible partial complex seizure with EEG showing cortical dysfunction and left temporal  region and initiated Keppra 500 mg twice daily for seizure prophylaxis.  LDL 159 - started atorvastatin 80 mg daily.  Residual deficits of global aphasia, R>L hemiparesis and dysphagia.  Evaluated by therapies who recommended discharge to CIR for ongoing therapy needs.  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 Stent occlusion CT head 3/13 no acute infarct   CTA head and neck 3/13 bilateral M1 high-grade stenosis and bilateral P2 moderate stenosis.   MRI 10/23/2020 showed 3 punctate infarcts at left insular cortex. Neuro worsening with CT 3/14 showed extension of left MCA infarct.   MRI 3/14 confirmed left MCA infarct extension.   Repeat CTA head 3/15 similar bilateral M1 high-grade stenosis.   Status post left M1 stent on 10/26/2020.   However, patient symptoms continue to worsen with right hemiplegia, therefore 10/27/2020 CT repeat which showed left large MCA infarct.   MRI 3/20 large left MCA infarct MRA signal loss related to left MCA stents, ? stent occlusion  No further intervention at this time. Plan to follow-up with Dr. Estanislado Pandy in clinic 2 weeks after discharge  EEG negative for seizure.   EF 60 to 65%. LDL 159 HgbA1c 5.6 VTE prophylaxis - lovenox No antithrombotic prior to admission. On ASA '81mg'$  daily and Brilinta 90 mg BID post stenting for 3 months Therapy recommendations: CIR Dispo: CIR today 3/22  Last visit 12/20/2020, Brittany Griffith is being seen for hospital follow-up accompanied by her brother who assists  with interpretation who has been with her for the past 5 days. Husband is waiting in the lobby She was discharged home from [facility] on 4/12 after a 21-day stay  Currently working with neuro rehab therapy with continued improvement of right-sided weakness, aphasia, dysphagia and cognitive impairment.  Currently ambulating without assistive device and  denies any recent falls.  Brother reports being on modified diet without difficulty but unable to state what level -recently seen in the ED with suspected aspiration pneumonia 5/2 currently on clindamycin.  Denies new or worsening stroke/TIA symptoms  Remains on Keppra 500 mg twice daily tolerating without side effects and denies any seizure activity Remains on aspirin and Brilinta with mild bruising but no bleeding Remains on atorvastatin without associated side effects Blood pressure today 121/75 -  not routinely monitored at home  She has not yet had follow-up with Dr. Estanislado Pandy Has had follow-up with neurosurgery Dr. Zada Finders 4/28 but unable to view Imp/plan part of note -brother is not sure of further recommendations or recommended follow-up  Brother asks multiple questions in regards to current medications and ongoing use as well as currently participating in therapies and different exercises to do at home No further concerns at this time  Update 04/18/2021 : She returns for follow-up after last visit with Brittany Griffith 4 months ago.  She is accompanied by her brother and [place] language interpreter..  Patient continues to have significant aphasia and mild right hemiparesis.  She is able to understand simple commands and speaks occasional words but remains profoundly aphasic.  She has noticed improvement in the right-sided strength but she still has significant weakness of the right grip and hand.  She is able to ambulate independently without even a cane or a walker.  She remains on aspirin and Brilinta which she is tolerating well without bruising or bleeding.  She has finished speech, physical and occupational therapies.  She has not had any follow-up lipid profile and A1c done.  Blood pressure is well controlled today it is elevated slightly at 152/72.  She is tolerating Lipitor well without muscle aches and pains.  She remains on Keppra which is tolerating well and has had no  breakthrough seizures.  She had follow-up CT angiogram of the brain and neck done on 01/11/2021 by Dr. Estanislado Pandy which showed patent left M1 stent without significant restenosis.  There was moderate right M1 and bilateral moderate P2 segment stenosis noted.  She has not had any recurrent stroke or TIA symptoms.  She follows with Dr. Venetia Constable for her meningioma and has not had any recent follow-up imaging study done ROS:   N/A d/t language difficulty  PMH:  Past Medical History:  Diagnosis Date   Hypercholesteremia    Hypertension    Osteopenia    Stroke ([place])     PSH:  Past Surgical History:  Procedure Laterality Date   IR ANGIO INTRA EXTRACRAN SEL COM CAROTID INNOMINATE UNI R MOD SED  10/26/2020   IR INTRA CRAN STENT  10/26/2020   IR RADIOLOGIST EVAL & MGMT  12/09/2020   RADIOLOGY WITH ANESTHESIA Left 10/26/2020   Procedure: RADIOLOGY WITH ANESTHESIA LEFT MCA STENT;  Surgeon: Luanne Bras, MD;  Location: [facility];  Service: Radiology;  Laterality: Left;    Social History:  Social History   Socioeconomic History   Marital status: Married    Spouse name: Not on file   Number of children: Not on file   Years of education: Not on file   Highest  education level: Not on file  Occupational History   Not on file  Tobacco Use   Smoking status: Never   Smokeless tobacco: Never  Substance and Sexual Activity   Alcohol use: Not Currently   Drug use: Not Currently   Sexual activity: Not on file  Other Topics Concern   Not on file  Social History Narrative   Not on file   Social Determinants of Health   Financial Resource Strain: Not on file  Food Insecurity: Not on file  Transportation Needs: Not on file  Physical Activity: Not on file  Stress: Not on file  Social Connections: Not on file  Intimate Partner Violence: Not on file    Family History:  Family History  Problem Relation Age of Onset   Stroke Father    Heart attack Father    Kidney disease Brother      Medications:   Current Outpatient Medications on File Prior to Visit  Medication Sig Dispense Refill   amLODipine (NORVASC) 5 MG tablet Take 1 tablet (5 mg total) by mouth daily. 30 tablet 0   aspirin 81 MG chewable tablet Chew 1 tablet (81 mg total) by mouth daily. 30 tablet 0   atorvastatin (LIPITOR) 80 MG tablet Take 1 tablet (80 mg total) by mouth daily. 90 tablet 0   clindamycin (CLEOCIN) 300 MG capsule Take 1 capsule (300 mg total) by mouth 3 (three) times daily. 21 capsule 0   levETIRAcetam (KEPPRA) 100 MG/ML solution Take 5 mLs (500 mg total) by mouth 2 (two) times daily. 473 mL 12   losartan (COZAAR) 50 MG tablet Take 0.5 tablets (25 mg total) by mouth 2 (two) times daily. 60 tablet 0   Multiple Vitamins-Minerals (MULTIVITAMIN WITH MINERALS) tablet Take 1 tablet by mouth daily.     polyethylene glycol (MIRALAX / GLYCOLAX) 17 g packet Place 17 g into feeding tube 2 (two) times daily. 14 each 0   potassium chloride SA (KLOR-CON) 20 MEQ tablet Take 1 tablet (20 mEq total) by mouth daily. 30 tablet 0   No current facility-administered medications on file prior to visit.    Allergies:   Allergies  Allergen Reactions   Penicillins     unk reaction      OBJECTIVE:  Physical Exam  Vitals:   04/18/21 1139  BP: (!) 152/72  Pulse: 63  Weight: 130 lb (59 kg)  Height: '5\' 1"'$  (1.549 m)   Body mass index is 24.56 kg/m. No results found.  Post stroke PHQ 2/9 Depression screen PHQ 2/9 02/23/2021  Decreased Interest 0  Down, Depressed, Hopeless 0  PHQ - 2 Score 0    General: well developed, well nourished,  pleasant middle-age Asian female, seated, in no evident distress Head: head normocephalic and atraumatic.   Neck: supple with no carotid or supraclavicular bruits Cardiovascular: regular rate and rhythm, no murmurs Musculoskeletal: no deformity Skin:  no rash/petichiae Vascular:  Normal pulses all extremities   Neurologic Exam Mental Status: Awake and fully alert.  Severe expressive aphasia -mild receptive aphasia.  And can follow simple one-step commands and can mimic quite well.  Primarily Brittany Griffith speaking but does understand english.  Able to nod yes/no.  Difficulty assessing cognition due to language impairment.  Able to follow simple step commands.  Mood and affect appropriate.  Cranial Nerves: Fundoscopic exam reveals sharp disc margins. Pupils equal, briskly reactive to light. Extraocular movements full without nystagmus. Visual fields blinks to threat in all 4 quadrants. Hearing intact. Facial sensation  intact.  Right lower facial weakness.  Tongue and palate moves normally and symmetrically.  Motor:  RUE 3/5 proximal and 2/5 distal RLE: 4/5 mild right foot drop LUE: 4+-5/5 LLE: 4+-5/5 Sensory.: intact to touch , pinprick , position and vibratory sensation.  Coordination: Rapid alternating movements normal in all extremities on left side. Finger-to-nose and heel-to-shin performed accurately on left side. Gait and Station: Arises from chair without difficulty. Stance is normal. Gait demonstrates  hemiplegic gait with unsteadiness and without use of assistive device.  Tandem walk and heel toe not attempted. Reflexes: 2+ and asymmetric and brisker on the right. Toes downgoing.     NIHSS  7 Modified Rankin  3      ASSESSMENT: Verbena Heffington is a 70 y.o. year old female presented on 10/23/2020 with left MCA infarct due to L M1 stenosis with worsening on 3/14 with progressive L MCA territory infarct s/p L M1 stenting on 3/16 /22 . 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  Vascular risk factors include HTN, HLD, multiple left hemisphere meningioma and advanced age .  She has significant residual aphasia and right hemiplegia     PLAN:  I had a long d/w patient, her brother and  interpreter about his recent stroke, risk for recurrent stroke/TIAs, personally independently reviewed imaging studies and stroke evaluation results and answered  questions.Continue aspirin 81 mg daily but stop Brilinta as it has been more than 6 months since the left MCA stent for secondary stroke prevention and maintain strict control of hypertension with blood pressure goal below 130/90, diabetes with hemoglobin A1c goal below 6.5% and lipids with LDL cholesterol goal below 70 mg/dL. I also advised the patient to eat a healthy diet with plenty of whole grains, cereals, fruits and vegetables, exercise regularly and maintain ideal body weight .consider referral to [facility] transcranial magnetic research study for right upper extremity weakness.  Also consider referral to [facility] speech therapy program for more speech therapy if she qualifies.  Followup in the future with me in 6 months or call earlier if necessary.  Greater than 50% time during this 35-minute visit was spent in counseling and coordination of care about her remote stroke and residual aphasia and hemiparesis and discussion with her brother and answering questions  [facility] [address] [place], [place] [address]  Phone [phone] Fax [phone] Note: This document was prepared with digital dictation and possible smart phrase technology. Any transcriptional errors that result from this process are unintentional.

## 2021-04-19 LAB — LIPID PANEL
Chol/HDL Ratio: 2.6 ratio (ref 0.0–4.4)
Cholesterol, Total: 171 mg/dL (ref 100–199)
HDL: 67 mg/dL (ref >39)
LDL Chol Calc (NIH): 78 mg/dL (ref 0–99)
Triglycerides: 151 mg/dL — ABNORMAL HIGH (ref 0–149)
VLDL Cholesterol Cal: 26 mg/dL (ref 5–40)

## 2021-04-19 LAB — HEMOGLOBIN A1C
Est. average glucose Bld gHb Est-mCnc: 126 mg/dL
Hgb A1c MFr Bld: 6 % — ABNORMAL HIGH (ref 4.8–5.6)

## 2021-04-24 ENCOUNTER — Encounter: Payer: Self-pay | Admitting: *Deleted

## 2021-04-28 ENCOUNTER — Encounter: Payer: Medicare HMO | Admitting: Physical Medicine & Rehabilitation

## 2021-06-09 ENCOUNTER — Encounter: Payer: Medicare HMO | Attending: Physical Medicine & Rehabilitation | Admitting: Physical Medicine & Rehabilitation

## 2021-06-09 ENCOUNTER — Encounter: Payer: Self-pay | Admitting: Physical Medicine & Rehabilitation

## 2021-06-09 ENCOUNTER — Other Ambulatory Visit: Payer: Self-pay

## 2021-06-09 VITALS — BP 148/64 | HR 70 | Ht 61.0 in | Wt 143.0 lb

## 2021-06-09 DIAGNOSIS — I69320 Aphasia following cerebral infarction: Secondary | ICD-10-CM | POA: Diagnosis present

## 2021-06-09 DIAGNOSIS — G8191 Hemiplegia, unspecified affecting right dominant side: Secondary | ICD-10-CM

## 2021-06-09 NOTE — Progress Notes (Signed)
Subjective:    Patient ID: Brittany Griffith, female    DOB: 21-Aug-1950, 70 y.o.   MRN: [id number]  70 y.o. right-handed limited English speaking female with hyperlipidemia as well as hypertension.  Per chart review lives with spouse and independent prior to admission.  1 level home one-step to entry.  Presented 10/23/2020 with right side weakness headache as well as slurred speech.  Noted blood pressure 200/77.  CT angiogram of head and neck showed left frontal parafalcine mass with extensive regional hyperostosis possibly reflecting en plaque meningioma.  Mild edema in the left frontal lobe.  Trace local rightward midline shift.  No large vessel occlusion.  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  2.6 cm enhancing left frontal parafalcine mass consistent with meningioma minimal rightward midline shift.  Neurosurgery Dr. Venetia Constable follow-up in regards to meningioma felt to be slow-growing and would follow-up outpatient after initial evaluation by neurology services for CVA.  EEG negative for seizure maintained on Keppra for seizure prophylaxis.  Echocardiogram with ejection fraction of 60 to 65% no wall motion abnormalities.  Patient underwent cerebral angiogram 10/26/2020 followed by stent assisted angioplasty of left MCA with T TICI revascularization per interventional radiology.  Currently maintained on aspirin 81 mg daily and Brilinta 90 mg twice daily post stenting.  Cleared to begin Lovenox for DVT prophylaxis.  She was maintained on 3% saline for her cerebral edema and monitoring of sodium levels.  On 10/30/2020 patient with questionable increasing weakness right upper extremity MRI/MRI completed showing considerable extension of left middle cerebral artery territory infarction involving the vast majority of the cortical and subcortical distribution of the left middle cerebral artery with sparing of the basal ganglia and patient remained on aspirin as well  as Brilinta.    Admit date: 11/01/2020 Discharge date: 11/22/2020 HPI Patient returns today with husband interpreting.  They speak Nepali. She continues to have problems with aphasia.  She is trying to speak at home with family. She has completed outpatient PT OT speech. Amb without AD, no falls Mod I dressing and bathing  Right shoulder pain has improved Pain Inventory Average Pain 0 Pain Right Now 0 My pain is  Right side of body weakness, possible numbness per spouse  LOCATION OF PAIN  Right side of body  BOWEL Number of stools per week: 7   BLADDER Normal    Mobility walk without assistance how many minutes can you walk? 20 minutes ability to climb steps?  yes do you drive?  no  Function retired I need assistance with the following:  meal prep and household duties Do you have any goals in this area?  yes  Neuro/Psych weakness numbness  Prior Studies Any changes since last visit?  no  Physicians involved in your care Any changes since last visit?  no   Family History  Problem Relation Age of Onset   Stroke Father    Heart attack Father    Kidney disease Brother    Social History   Socioeconomic History   Marital status: Married    Spouse name: Not on file   Number of children: Not on file   Years of education: Not on file   Highest education level: Not on file  Occupational History   Not on file  Tobacco Use   Smoking status: Never   Smokeless tobacco: Never  Vaping Use   Vaping Use: Never used  Substance and Sexual Activity   Alcohol  use: Not Currently   Drug use: Not Currently   Sexual activity: Not on file  Other Topics Concern   Not on file  Social History Narrative   Not on file   Social Determinants of Health   Financial Resource Strain: Not on file  Food Insecurity: Not on file  Transportation Needs: Not on file  Physical Activity: Not on file  Stress: Not on file  Social Connections: Not on file   Past Surgical History:   Procedure Laterality Date   IR ANGIO INTRA EXTRACRAN SEL COM CAROTID INNOMINATE UNI R MOD SED  10/26/2020   IR [place]  10/26/2020   IR RADIOLOGIST EVAL & MGMT  12/09/2020   RADIOLOGY WITH ANESTHESIA Left 10/26/2020   Procedure: RADIOLOGY WITH ANESTHESIA LEFT MCA STENT;  Surgeon: Luanne Bras, MD;  Location: [place];  Service: Radiology;  Laterality: Left;   Past Medical History:  Diagnosis Date   Hypercholesteremia    Hypertension    Osteopenia    Stroke ([place])    BP (!) 148/64   Pulse 70   Ht 5\' 1"  (1.549 m)   Wt 143 lb (64.9 kg)   SpO2 96%   BMI 27.02 kg/m   Opioid Risk Score:   Fall Risk Score:  `1  Depression screen PHQ 2/9  Depression screen [facility] 2/9 02/23/2021 12/20/2020  Decreased Interest 0 0  Down, Depressed, Hopeless 0 0  PHQ - 2 Score 0 0    Review of Systems  Respiratory:  Positive for cough and shortness of breath.   Musculoskeletal:        Right side weakness & numbness  All other systems reviewed and are negative.     Objective:   Physical Exam  Motor strength is 3 - right deltoid to minus at the bicep to minus finger flexion 0 at the wrist extensors or elbow extensor 0 at the pronators and supinators Right lower extremity has 4/5 strength in the hip flexor knee extensor and 3 at the ankle dorsiflexion Left upper and left lower limb have normal strength Sensation is reported is equal bilateral upper and lower limbs to light touch Tone is normal bilateral upper and lower limbs Musculoskeletal patient has no pain with shoulder range of motion she does mildly reduced external rotation at the right shoulder No vasomotor changes in the right hand. Negative straight leg raise on the right side in the lower extremity.       Assessment & Plan:   1.  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  She has been stented, she follows up with neurology, She has completed inpatient and outpatient therapy.  I do not think she needs any  further therapy at the current time.  She is modified independent with all self-care and mobility. 2.  Postop shoulder pain improved still at risk for developing shoulder pain continue range of motion exercises. 3.  Aphasia poststroke timeframe of recovery normal plateau around 9 to 12 months.  Discussed this with patient and husband we will see her back in 6 months.  Continue attempts at a conversation as well as naming objects in the room.

## 2021-08-08 ENCOUNTER — Other Ambulatory Visit (HOSPITAL_COMMUNITY): Payer: Self-pay | Admitting: Interventional Radiology

## 2021-08-08 DIAGNOSIS — I6602 Occlusion and stenosis of left middle cerebral artery: Secondary | ICD-10-CM

## 2021-08-17 ENCOUNTER — Telehealth (HOSPITAL_COMMUNITY): Payer: Self-pay

## 2021-08-17 NOTE — Telephone Encounter (Signed)
Called to schedule cta, no answer, left vm. AW  

## 2021-08-29 ENCOUNTER — Ambulatory Visit (HOSPITAL_COMMUNITY)
Admission: RE | Admit: 2021-08-29 | Discharge: 2021-08-29 | Disposition: A | Discharge status: Home / Self Care | Payer: Medicare HMO | Source: Ambulatory Visit | Attending: Interventional Radiology | Admitting: Interventional Radiology

## 2021-08-29 ENCOUNTER — Other Ambulatory Visit: Payer: Self-pay

## 2021-08-29 DIAGNOSIS — I6602 Occlusion and stenosis of left middle cerebral artery: Secondary | ICD-10-CM | POA: Insufficient documentation

## 2021-08-29 LAB — POCT I-STAT CREATININE: Creatinine, Ser: 0.8 mg/dL (ref 0.44–1.00)

## 2021-08-29 MED ORDER — SODIUM CHLORIDE (PF) 0.9 % IJ SOLN
INTRAMUSCULAR | Status: AC
  Filled 2021-08-29: qty 50

## 2021-08-29 MED ORDER — IOHEXOL 350 MG/ML SOLN
75.0000 mL | Freq: Once | INTRAVENOUS | Status: AC | PRN
  Administered 2021-08-29: 75 mL via INTRAVENOUS

## 2021-08-30 ENCOUNTER — Telehealth: Payer: Self-pay | Admitting: Student

## 2021-08-30 NOTE — Telephone Encounter (Signed)
Dr. Estanislado Pandy has reviewed recent CT imaging and would like to see the patient or talk to the phone via tele-visit to discuss findings. A scheduler from our office will call the patient to arrange a date/time.  Soyla Dryer, [place] [phone] 08/30/2021, 2:20 PM

## 2021-09-05 ENCOUNTER — Other Ambulatory Visit (HOSPITAL_COMMUNITY): Payer: Self-pay | Admitting: Interventional Radiology

## 2021-09-05 ENCOUNTER — Telehealth (HOSPITAL_COMMUNITY): Payer: Self-pay

## 2021-09-05 DIAGNOSIS — I6602 Occlusion and stenosis of left middle cerebral artery: Secondary | ICD-10-CM

## 2021-09-05 NOTE — Telephone Encounter (Signed)
Called to schedule consult, no answer, no vm. AW  

## 2021-09-19 ENCOUNTER — Other Ambulatory Visit: Payer: Self-pay

## 2021-09-19 ENCOUNTER — Ambulatory Visit (HOSPITAL_COMMUNITY)
Admission: RE | Admit: 2021-09-19 | Discharge: 2021-09-19 | Disposition: A | Discharge status: Home / Self Care | Payer: Medicare HMO | Source: Ambulatory Visit | Attending: Interventional Radiology | Admitting: Interventional Radiology

## 2021-09-19 DIAGNOSIS — I6602 Occlusion and stenosis of left middle cerebral artery: Secondary | ICD-10-CM

## 2021-09-26 ENCOUNTER — Encounter (HOSPITAL_COMMUNITY): Payer: Self-pay | Admitting: Radiology

## 2021-10-16 HISTORY — PX: IR RADIOLOGIST EVAL & MGMT: IMG5224

## 2021-12-08 ENCOUNTER — Encounter: Payer: Self-pay | Admitting: Physical Medicine & Rehabilitation

## 2021-12-08 ENCOUNTER — Encounter: Payer: Medicare HMO | Attending: Physical Medicine & Rehabilitation | Admitting: Physical Medicine & Rehabilitation

## 2021-12-08 VITALS — BP 120/73 | HR 67 | Ht 61.0 in | Wt 140.6 lb

## 2021-12-08 DIAGNOSIS — I69320 Aphasia following cerebral infarction: Secondary | ICD-10-CM | POA: Insufficient documentation

## 2021-12-08 DIAGNOSIS — G8191 Hemiplegia, unspecified affecting right dominant side: Secondary | ICD-10-CM | POA: Insufficient documentation

## 2021-12-08 NOTE — Patient Instructions (Addendum)
Benign Positional Vertigo- please discuss with neurologist, PT therapy may be helpful  ?Vertigo is the feeling that you or your surroundings are moving when they are not. Benign positional vertigo is the most common form of vertigo. This is usually a harmless condition (benign). This condition is positional. This means that symptoms are triggered by certain movements and positions. ?This condition can be dangerous if it occurs while you are doing something that could cause harm to yourself or others. This includes activities such as driving or operating machinery. ?What are the causes? ?The inner ear has fluid-filled canals that help your brain sense movement and balance. When the fluid moves, the brain receives messages about your body's position. ?With benign positional vertigo, calcium crystals in the inner ear break free and disturb the inner ear area. This causes your brain to receive confusing messages about your body's position. ?What increases the risk? ?You are more likely to develop this condition if: ?You are a woman. ?You are 21 years of age or older. ?You have recently had a head injury. ?You have an inner ear disease. ?What are the signs or symptoms? ?Symptoms of this condition usually happen when you move your head or your eyes in different directions. Symptoms may start suddenly and usually last for less than a minute. They include: ?Loss of balance and falling. ?Feeling like you are spinning or moving. ?Feeling like your surroundings are spinning or moving. ?Nausea and vomiting. ?Blurred vision. ?Dizziness. ?Involuntary eye movement (nystagmus). ?Symptoms can be mild and cause only minor problems, or they can be severe and interfere with daily life. Episodes of benign positional vertigo may return (recur) over time. Symptoms may also improve over time. ?How is this diagnosed? ?This condition may be diagnosed based on: ?Your medical history. ?A physical exam of the head, neck, and ears. ?Positional  tests to check for or stimulate vertigo. You may be asked to turn your head and change positions, such as going from sitting to lying down. A health care provider will watch for symptoms of vertigo. ?You may be referred to a health care provider who specializes in ear, nose, and throat problems (ENT or otolaryngologist) or a provider who specializes in disorders of the nervous system (neurologist). ?How is this treated? ? ?This condition may be treated in a session in which your health care provider moves your head in specific positions to help the displaced crystals in your inner ear move. Treatment for this condition may take several sessions. Surgery may be needed in severe cases, but this is rare. ?In some cases, benign positional vertigo may resolve on its own in 2-4 weeks. ?Follow these instructions at home: ?Safety ?Move slowly. Avoid sudden body or head movements or certain positions, as told by your health care provider. ?Avoid driving or operating machinery until your health care provider says it is safe. ?Avoid doing any tasks that would be dangerous to you or others if vertigo occurs. ?If you have trouble walking or keeping your balance, try using a cane for stability. If you feel dizzy or unstable, sit down right away. ?Return to your normal activities as told by your health care provider. Ask your health care provider what activities are safe for you. ?General instructions ?Take over-the-counter and prescription medicines only as told by your health care provider. ?Drink enough fluid to keep your urine pale yellow. ?Keep all follow-up visits. This is important. ?Contact a health care provider if: ?You have a fever. ?Your condition gets worse or you develop  new symptoms. ?Your family or friends notice any behavioral changes. ?You have nausea or vomiting that gets worse. ?You have numbness or a prickling and tingling sensation. ?Get help right away if you: ?Have difficulty speaking or moving. ?Are always  dizzy or faint. ?Develop severe headaches. ?Have weakness in your legs or arms. ?Have changes in your hearing or vision. ?Develop a stiff neck. ?Develop sensitivity to light. ?These symptoms may represent a serious problem that is an emergency. Do not wait to see if the symptoms will go away. Get medical help right away. Call your local emergency services (911 in the [place] Do not drive yourself to the hospital. ?Summary ?Vertigo is the feeling that you or your surroundings are moving when they are not. Benign positional vertigo is the most common form of vertigo. ?This condition is caused by calcium crystals in the inner ear that become displaced. This causes a disturbance in an area of the inner ear that helps your brain sense movement and balance. ?Symptoms include loss of balance and falling, feeling that you or your surroundings are moving, nausea and vomiting, and blurred vision. ?This condition can be diagnosed based on symptoms, a physical exam, and positional tests. ?Follow safety instructions as told by your health care provider and keep all follow-up visits. This is important. ?This information is not intended to replace advice given to you by your health care provider. Make sure you discuss any questions you have with your health care provider. ?Document Revised: 06/29/2020 Document Reviewed: 06/29/2020 ?Elsevier Patient Education ? [place]. ? ?

## 2021-12-08 NOTE — Progress Notes (Signed)
? ?Subjective:  ? ? Patient ID: Brittany Griffith, female    DOB: 07-Feb-1951, 71 y.o.   MRN: [id number] ?71 y.o. right-handed limited English speaking female with hyperlipidemia as well as hypertension.  Per chart review lives with spouse and independent prior to admission.  1 level home one-step to entry.  Presented 10/23/2020 with right side weakness headache as well as slurred speech.  Noted blood pressure 200/77.  CT angiogram of head and neck showed left frontal parafalcine mass with extensive regional hyperostosis possibly reflecting en plaque meningioma.  Mild edema in the left frontal lobe.  Trace local rightward midline shift.  No large vessel occlusion.  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  2.6 cm enhancing left frontal parafalcine mass consistent with meningioma minimal rightward midline shift.  Neurosurgery Dr. Venetia Constable follow-up in regards to meningioma felt to be slow-growing and would follow-up outpatient after initial evaluation by neurology services for CVA.  EEG negative for seizure maintained on Keppra for seizure prophylaxis.  Echocardiogram with ejection fraction of 60 to 65% no wall motion abnormalities.  Patient underwent cerebral angiogram 10/26/2020 followed by stent assisted angioplasty of left MCA with T TICI revascularization per interventional radiology.  Currently maintained on aspirin 81 mg daily and Brilinta 90 mg twice daily post stenting.  Cleared to begin Lovenox for DVT prophylaxis.  She was maintained on 3% saline for her cerebral edema and monitoring of sodium levels.  On 10/30/2020 patient with questionable increasing weakness right upper extremity MRI/MRI completed showing considerable extension of left middle cerebral artery territory infarction involving the vast majority of the cortical and subcortical distribution of the left middle cerebral artery with sparing of the basal ganglia and patient remained on aspirin as well  as Brilinta.    ?Admit date: 11/01/2020 ?Discharge date: 11/22/2020 ?HPI ?Still with speech problem , gets "panicked " with position changes, the patient is aphasic and her husband states that she does not give much verbal information.  It seems like this occurs when she is laying down and getting back up ? ?Visited PCP, who ordered CT at [place] [facility]  ?Patient has an appointment with neurologist from [facility]. ?The patient also has some pain in the right upper and right lower limb. ?His walking tolerance is 20 minutes he climbs steps he does not drive she needs assist with meal prep and household duties but otherwise is able to dress herself.  She lives in a 1 level home with her husband.  She is able to enter the bathroom.  Pain Inventory ?Average Pain 3 ?Pain Right Now 3 ?My pain is tingling and aching ? ?LOCATION OF PAIN  head, neck, shoulder, elbow, wrist, hand, fingers, thigh, knee, leg, ankle, toes ? ?BOWEL ?Number of stools per week: 5 ? ? ?BLADDER ?Normal ? ? ? ?Mobility ?walk without assistance ?how many minutes can you walk? 20 ?ability to climb steps?  yes ?do you drive?  no ? ?Function ?retired ?I need assistance with the following:  meal prep and household duties ? ?Neuro/Psych ?weakness ?numbness ?dizziness ? ?Prior Studies ?Any changes since last visit?  no ? ?Physicians involved in your care ?Any changes since last visit?  no ? ? ?Family History  ?Problem Relation Age of Onset  ? Stroke Father   ? Heart attack Father   ? Kidney disease Brother   ? ?Social History  ? ?Socioeconomic History  ? Marital status: Married  ?  Spouse name: Not on file  ? Number of children: Not on file  ? Years of education: Not on file  ? Highest education level: Not on file  ?Occupational History  ? Not on file  ?Tobacco Use  ? Smoking status: Never  ? Smokeless tobacco: Never  ?Vaping Use  ? Vaping Use: Never used  ?Substance and Sexual Activity  ? Alcohol use: Not Currently  ? Drug use: Not  Currently  ? Sexual activity: Not on file  ?Other Topics Concern  ? Not on file  ?Social History Narrative  ? Not on file  ? ?Social Determinants of Health  ? ?Financial Resource Strain: Not on file  ?Food Insecurity: Not on file  ?Transportation Needs: Not on file  ?Physical Activity: Not on file  ?Stress: Not on file  ?Social Connections: Not on file  ? ?Past Surgical History:  ?Procedure Laterality Date  ? IR ANGIO INTRA EXTRACRAN SEL COM CAROTID INNOMINATE UNI R MOD SED  10/26/2020  ? IR INTRA CRAN STENT  10/26/2020  ? IR RADIOLOGIST EVAL & MGMT  12/09/2020  ? IR RADIOLOGIST EVAL & MGMT  10/16/2021  ? RADIOLOGY WITH ANESTHESIA Left 10/26/2020  ? Procedure: RADIOLOGY WITH ANESTHESIA LEFT MCA STENT;  Surgeon: Luanne Bras, MD;  Location: [place];  Service: Radiology;  Laterality: Left;  ? ?Past Medical History:  ?Diagnosis Date  ? Hypercholesteremia   ? Hypertension   ? Osteopenia   ? Stroke [facility])   ? ?BP 120/73   Pulse 67   Ht '5\' 1"'$  (1.549 m)   Wt 140 lb 9.6 oz (63.8 kg)   SpO2 96%   BMI 26.57 kg/m?  ? ?Opioid Risk Score:   ?Fall Risk Score:  `1 ? ?Depression screen PHQ 2/9 ? ? ?  12/08/2021  ? 12:07 PM 06/09/2021  ? 12:43 PM 02/23/2021  ? 11:11 AM 12/20/2020  ?  2:55 PM  ?Depression screen PHQ 2/9  ?Decreased Interest 0 0 0 0  ?Down, Depressed, Hopeless 1 0 0 0  ?PHQ - 2 Score 1 0 0 0  ?  ? ?Review of Systems  ?Constitutional: Negative.   ?HENT: Negative.    ?Eyes: Negative.   ?Respiratory: Negative.    ?Cardiovascular: Negative.   ?Gastrointestinal: Negative.   ?Endocrine: Negative.   ?Genitourinary: Negative.   ?Musculoskeletal:  Positive for neck pain.  ?Skin: Negative.   ?Allergic/Immunologic: Negative.   ?Neurological:  Positive for dizziness, weakness, numbness and headaches.  ?Hematological: Negative.   ?Psychiatric/Behavioral: Negative.    ? ?   ?Objective:  ? Physical Exam ?Vitals and nursing note reviewed.  ?Constitutional:   ?   Appearance: She is normal weight.  ?HENT:  ?   Head: Normocephalic and  atraumatic.  ?Eyes:  ?   Extraocular Movements: Extraocular movements intact.  ?   Conjunctiva/sclera: Conjunctivae normal.  ?   Pupils: Pupils are equal, round, and reactive to light.  ?Musculoskeletal:  ?   Comments: Motor strength is 3 - in the right deltoid bicep tricep grip isolated finger motions 3 - at the hip since her flexion plantarflexion ? ? ?Tried reproducing symptoms with going from side-lying to sitting position on the left side up to upright.  She became anxious with this, no evidence of nystagmus noted at the eyes.  She cannot verbalize any further details to her husband. ?She ambulates without assistive device no evidence of toe drag or knee instability  ?Skin: ?   General: Skin is warm and dry.  ?Neurological:  ?  Mental Status: She is alert and oriented to person, place, and time.  ?Psychiatric:     ?   Mood and Affect: Mood normal.     ?   Behavior: Behavior normal.  ? ? ? ? ? ?   ?Assessment & Plan:  ?1.  History of left MCA infarct with residual aphasia as well as right hemiparesis.  She is back to a modified independent level with much of her self-care and mobility. ?These episodes of anxiety with position changes appear to be consistent with benign positional vertigo however would recommend the patient follow-up with neurology so they can review CT of brain and make further recommendations. ?The patient is not planning to follow-up with neurology in [place] but rather at [facility]. ?We discussed that if the neurologist felt that the repeat CT scan showed no significant changes, should the patient may benefit from vestibular training exercises with physical therapy. ? ?

## 2022-06-05 IMAGING — CT CT HEAD W/O CM
3 of 4 series · 13 of 47 positions shown, 15 images · non-contrast
Comparison: October 26, 2020
COMPARISON: October 26, 2020

Addendum:
CLINICAL DATA: Right upper extremity weakness

EXAM:
CT HEAD WITHOUT CONTRAST
TECHNIQUE: Contiguous axial images were obtained from the base of the skull
through the vertex without intravenous contrast.

[Series 3: head without · axial · non-contrast · 0.42mm/px · z∈[+578,+703]mm · 7 of 35 slices shown, 9 images]
[im 5/35  brain]
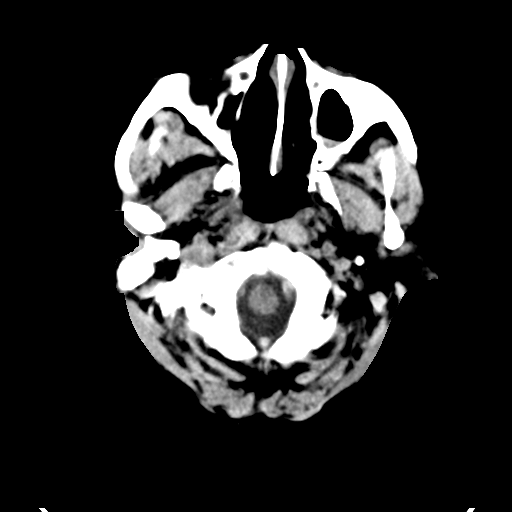
[im 5/35  bone]
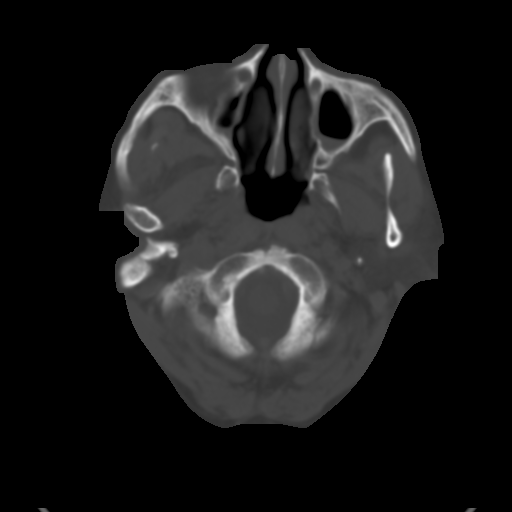
[im 9/35  brain]
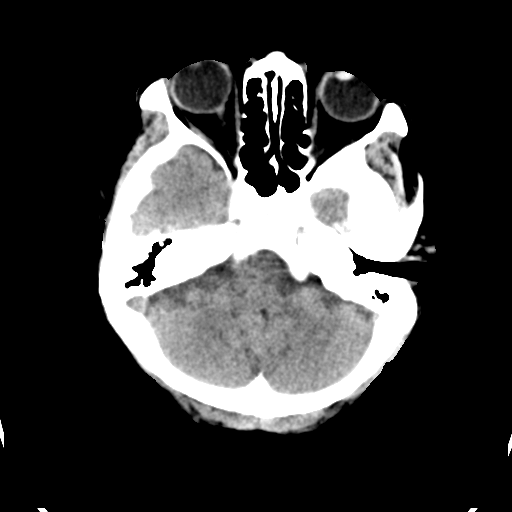
[im 13/35  brain]
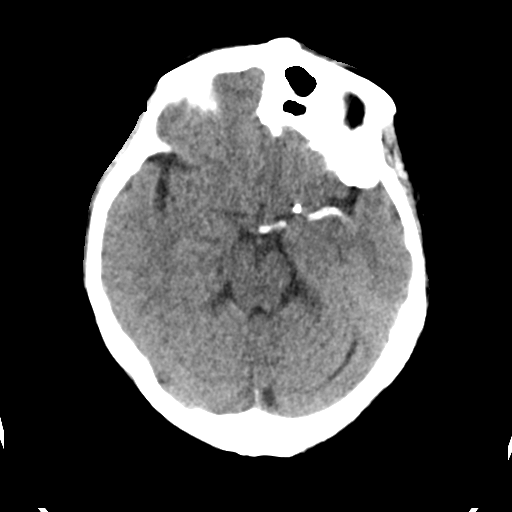
[im 18/35  brain]
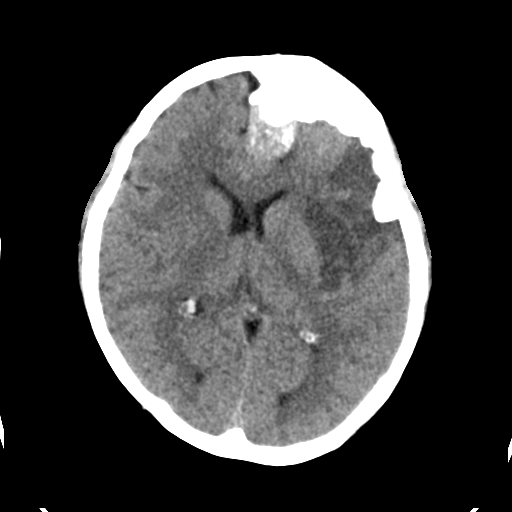
[im 22/35  brain]
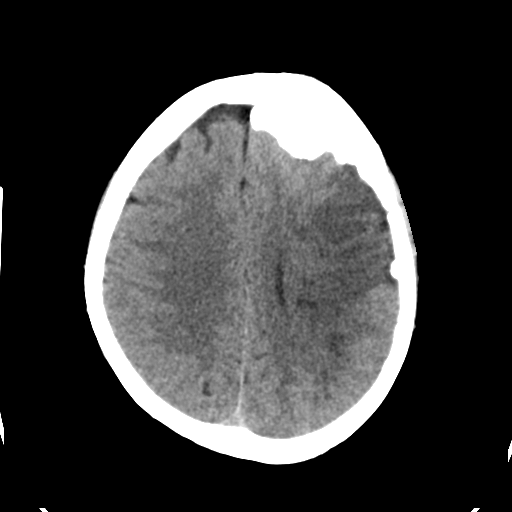
[im 22/35  bone]
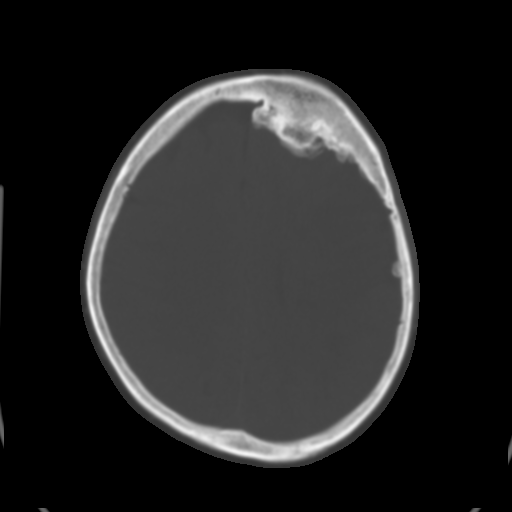
[im 26/35  brain]
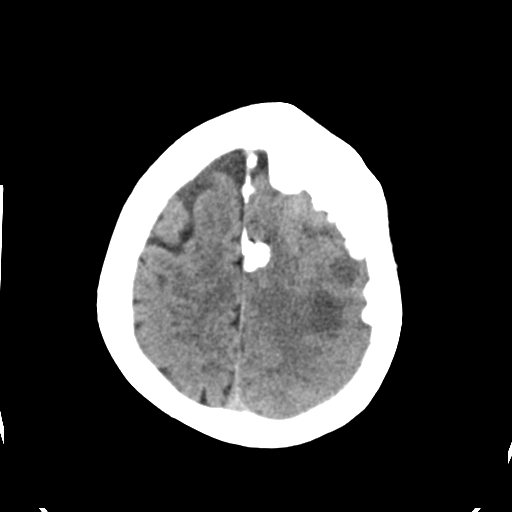
[im 30/35  brain]
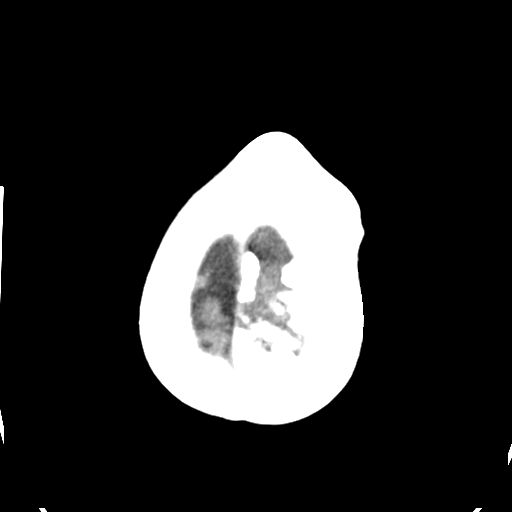

[Series 5: head without cor · coronal · non-contrast · 0.34mm/px · 3 of 65 slices shown]
[im 22/65  brain]
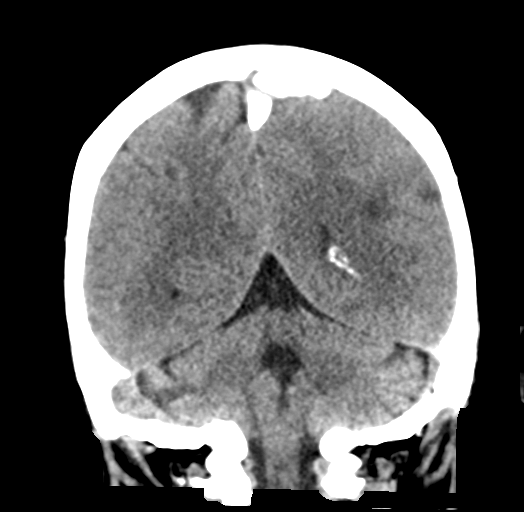
[im 29/65  brain]
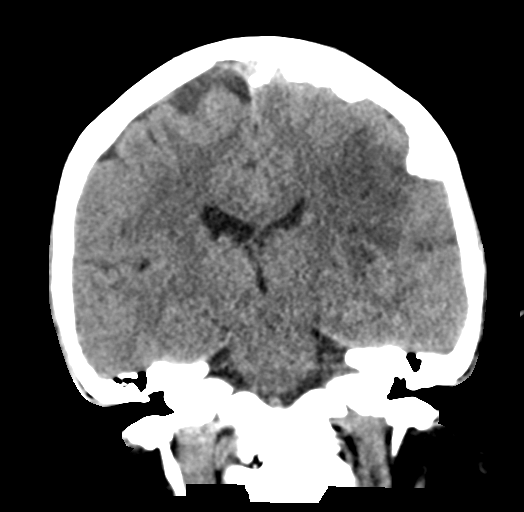
[im 36/65  brain]
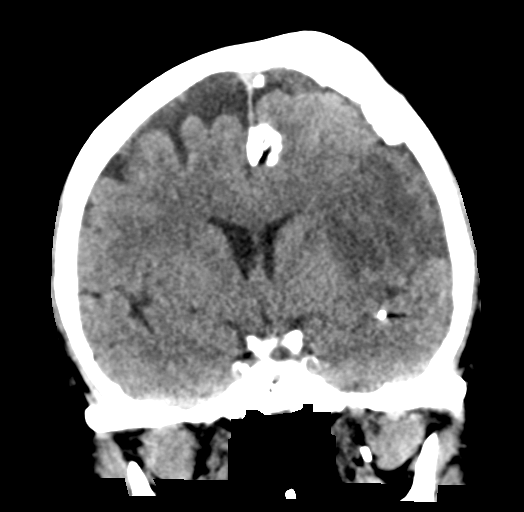

[Series 6: head without sag · sagittal · non-contrast · 0.34mm/px · 3 of 55 slices shown]
[im 19/55  brain]
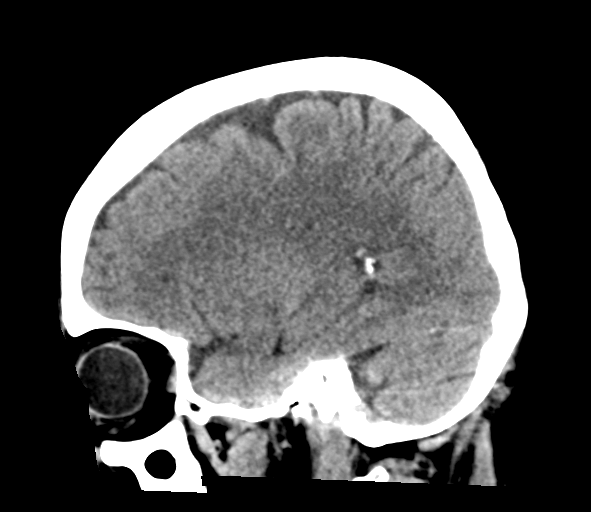
[im 28/55  brain]
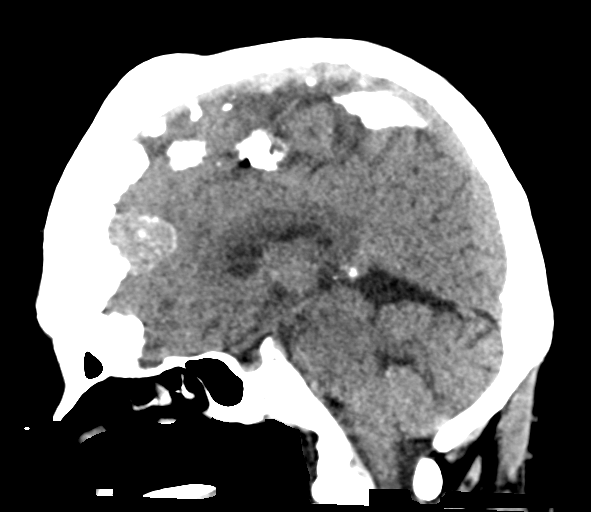
[im 37/55  brain]
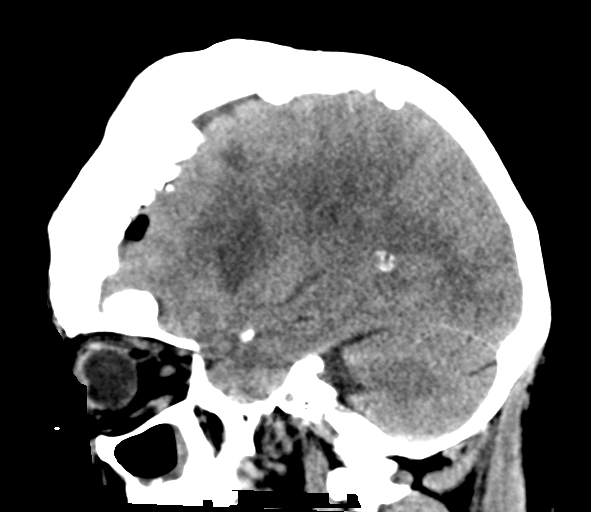

[13 of 47 positions shown; findings below may reference images not displayed]

FINDINGS: Brain: Compared to 1 day prior, there is now cytotoxic edema and
decreased attenuation throughout much of the anterior and mid left
temporal lobe as well as portions of the anterior left parietal lobe
and a portion of the posterior aspect of the left frontal lobe. This
edema causes relative effacement of the left lateral ventricle.
There is now approximately 6 mm of midline shift to the right.
Infarct is again noted involving the left external capsule,
claustrum, extreme capsule as well as insular cortex anteriorly and
midportion regions.

Previous partially calcified meningioma in the medial left frontal
region with extensive dural thickening/calcification in the left
frontal region is stable consistent with meningioma. This appearance
is stable. Calcification along the dura of the left anterior
parietal lobe and anterior left temporal lobe is also stable. No new
evident mass. No acute hemorrhage is evident.

Vascular: There is a left middle cerebral artery hyperdense vessel
consistent with occlusion of the left middle cerebral artery. There
is calcification in each carotid siphon region.

Skull: Bony calvarium appears intact. Meningioma changes on the left
as noted above.

Sinuses/Orbits: Mucosal thickening noted in the maxillary antra
bilaterally with small air-fluid levels. Mucosal thickening noted in
several ethmoid air cells. Orbits appear symmetric bilaterally.

Other: Mastoid air cells clear.
IMPRESSION: 1. Hyperdense vessel left middle cerebral artery consistent with
occlusion of this vessel.

2. Extensive infarct throughout much of the left temporal lobe,
posterior left frontal lobe, anterior left parietal lobe consistent
with evolving left middle cerebral artery distribution infarct. This
infarct includes portions of the left external capsule, claustrum,
extreme capsule, and insular cortex. There is cytotoxic edema
causing effacement of the left frontal horn and 6 mm of midline
shift toward the right. Fourth ventricle midline.

3. Left frontal meningioma with extensive dural base calcification
on the left persists without change.

4.  Foci of arterial vascular calcification noted.

5.  Areas of paranasal sinus disease noted.

These results will be called to the ordering clinician or
representative by the Radiologist Assistant, and communication
documented in the PACS or [REDACTED].

ADDENDUM:
Comment: The increased attenuation in the left middle cerebral
artery is actually due to a stent in this vessel.

*** End of Addendum ***
FINDINGS: Brain: Compared to 1 day prior, there is now cytotoxic edema and
decreased attenuation throughout much of the anterior and mid left
temporal lobe as well as portions of the anterior left parietal lobe
and a portion of the posterior aspect of the left frontal lobe. This
edema causes relative effacement of the left lateral ventricle.
There is now approximately 6 mm of midline shift to the right.
Infarct is again noted involving the left external capsule,
claustrum, extreme capsule as well as insular cortex anteriorly and
midportion regions.

Previous partially calcified meningioma in the medial left frontal
region with extensive dural thickening/calcification in the left
frontal region is stable consistent with meningioma. This appearance
is stable. Calcification along the dura of the left anterior
parietal lobe and anterior left temporal lobe is also stable. No new
evident mass. No acute hemorrhage is evident.

Vascular: There is a left middle cerebral artery hyperdense vessel
consistent with occlusion of the left middle cerebral artery. There
is calcification in each carotid siphon region.

Skull: Bony calvarium appears intact. Meningioma changes on the left
as noted above.

Sinuses/Orbits: Mucosal thickening noted in the maxillary antra
bilaterally with small air-fluid levels. Mucosal thickening noted in
several ethmoid air cells. Orbits appear symmetric bilaterally.

Other: Mastoid air cells clear.
IMPRESSION: 1. Hyperdense vessel left middle cerebral artery consistent with
occlusion of this vessel.

2. Extensive infarct throughout much of the left temporal lobe,
posterior left frontal lobe, anterior left parietal lobe consistent
with evolving left middle cerebral artery distribution infarct. This
infarct includes portions of the left external capsule, claustrum,
extreme capsule, and insular cortex. There is cytotoxic edema
causing effacement of the left frontal horn and 6 mm of midline
shift toward the right. Fourth ventricle midline.

3. Left frontal meningioma with extensive dural base calcification
on the left persists without change.

4.  Foci of arterial vascular calcification noted.

5.  Areas of paranasal sinus disease noted.

These results will be called to the ordering clinician or
representative by the Radiologist Assistant, and communication
documented in the PACS or [REDACTED].

## 2022-09-18 ENCOUNTER — Other Ambulatory Visit (HOSPITAL_COMMUNITY): Payer: Self-pay | Admitting: Interventional Radiology

## 2022-09-18 DIAGNOSIS — I6602 Occlusion and stenosis of left middle cerebral artery: Secondary | ICD-10-CM

## 2023-01-09 ENCOUNTER — Ambulatory Visit (HOSPITAL_COMMUNITY)
Admission: RE | Admit: 2023-01-09 | Discharge: 2023-01-09 | Disposition: A | Discharge status: Home / Self Care | Payer: Medicare HMO | Source: Ambulatory Visit | Attending: Interventional Radiology | Admitting: Interventional Radiology

## 2023-01-09 DIAGNOSIS — I6602 Occlusion and stenosis of left middle cerebral artery: Secondary | ICD-10-CM | POA: Insufficient documentation

## 2023-01-21 ENCOUNTER — Telehealth (HOSPITAL_COMMUNITY): Payer: Self-pay

## 2023-01-21 NOTE — Telephone Encounter (Signed)
Pt's husband agreed to f/u in 1 year with an mri w/wo and mra wo. AB

## 2024-01-08 ENCOUNTER — Other Ambulatory Visit (HOSPITAL_COMMUNITY): Payer: Self-pay | Admitting: Interventional Radiology

## 2024-01-08 DIAGNOSIS — I771 Stricture of artery: Secondary | ICD-10-CM

## 2024-02-06 ENCOUNTER — Ambulatory Visit (HOSPITAL_COMMUNITY)
Admission: RE | Admit: 2024-02-06 | Discharge: 2024-02-06 | Disposition: A | Discharge status: Home / Self Care | Source: Ambulatory Visit | Attending: Interventional Radiology | Admitting: Interventional Radiology

## 2024-02-06 DIAGNOSIS — I771 Stricture of artery: Secondary | ICD-10-CM | POA: Diagnosis present

## 2024-02-06 DIAGNOSIS — G9389 Other specified disorders of brain: Secondary | ICD-10-CM | POA: Insufficient documentation

## 2024-02-06 DIAGNOSIS — I672 Cerebral atherosclerosis: Secondary | ICD-10-CM | POA: Insufficient documentation

## 2024-02-06 DIAGNOSIS — D320 Benign neoplasm of cerebral meninges: Secondary | ICD-10-CM | POA: Insufficient documentation

## 2024-02-06 MED ORDER — GADOBUTROL 1 MMOL/ML IV SOLN
6.0000 mL | Freq: Once | INTRAVENOUS | Status: AC | PRN
  Administered 2024-02-06: 6 mL via INTRAVENOUS

## 2024-02-08 ENCOUNTER — Encounter (HOSPITAL_COMMUNITY): Payer: Self-pay | Admitting: Interventional Radiology
